# Patient Record
Sex: Male | Born: 1985
Health system: Southern US, Community
[De-identification: ages and names within clinical notes are randomized; demographics above are authoritative.]

## PROBLEM LIST (undated history)

## (undated) DIAGNOSIS — F309 Manic episode, unspecified: Secondary | ICD-10-CM

## (undated) DIAGNOSIS — F319 Bipolar disorder, unspecified: Secondary | ICD-10-CM

## (undated) DIAGNOSIS — R45851 Suicidal ideations: Secondary | ICD-10-CM

## (undated) DIAGNOSIS — F419 Anxiety disorder, unspecified: Secondary | ICD-10-CM

## (undated) DIAGNOSIS — F329 Major depressive disorder, single episode, unspecified: Secondary | ICD-10-CM

## (undated) DIAGNOSIS — F32A Depression, unspecified: Secondary | ICD-10-CM

## (undated) HISTORY — DX: Manic episode, unspecified: F30.9

## (undated) HISTORY — PX: WISDOM TOOTH EXTRACTION: SHX21

## (undated) HISTORY — DX: Anxiety disorder, unspecified: F41.9

## (undated) HISTORY — DX: Depression, unspecified: F32.A

## (undated) HISTORY — DX: Major depressive disorder, single episode, unspecified: F32.9

---

## 2019-01-09 ENCOUNTER — Encounter (INDEPENDENT_AMBULATORY_CARE_PROVIDER_SITE_OTHER): Payer: Self-pay

## 2019-01-09 ENCOUNTER — Other Ambulatory Visit: Payer: Self-pay

## 2019-01-09 ENCOUNTER — Encounter (HOSPITAL_COMMUNITY): Payer: Self-pay | Admitting: Psychiatry

## 2019-01-09 ENCOUNTER — Ambulatory Visit (INDEPENDENT_AMBULATORY_CARE_PROVIDER_SITE_OTHER): Payer: BLUE CROSS/BLUE SHIELD | Admitting: Psychiatry

## 2019-01-09 VITALS — BP 122/82 | HR 73 | Ht 71.0 in | Wt 159.0 lb

## 2019-01-09 DIAGNOSIS — F900 Attention-deficit hyperactivity disorder, predominantly inattentive type: Secondary | ICD-10-CM

## 2019-01-09 DIAGNOSIS — F411 Generalized anxiety disorder: Secondary | ICD-10-CM | POA: Insufficient documentation

## 2019-01-09 DIAGNOSIS — G4709 Other insomnia: Secondary | ICD-10-CM | POA: Diagnosis not present

## 2019-01-09 DIAGNOSIS — F3181 Bipolar II disorder: Secondary | ICD-10-CM | POA: Insufficient documentation

## 2019-01-09 DIAGNOSIS — R45851 Suicidal ideations: Secondary | ICD-10-CM

## 2019-01-09 MED ORDER — DULOXETINE HCL 60 MG PO CPEP: 60.0000 mg | ORAL_CAPSULE | Freq: Every day | ORAL | 0 refills | Status: DC

## 2019-01-09 MED ORDER — LURASIDONE HCL 40 MG PO TABS: 40.0000 mg | ORAL_TABLET | Freq: Every day | ORAL | 0 refills | Status: DC

## 2019-01-09 MED ORDER — LAMOTRIGINE 200 MG PO TABS: 200.0000 mg | ORAL_TABLET | Freq: Every day | ORAL | 2 refills | Status: DC

## 2019-01-09 NOTE — Progress Notes (Signed)
Psychiatric Initial Adult Assessment   Patient Identification: Adam [place]en. MRN:  [id number] Date of Evaluation:  01/09/2019 Referral Source: self Chief Complaint:  Depression, initial insomnia, anxiety Visit Diagnosis:    ICD-10-CM   1. Bipolar 2 disorder, major depressive episode (HCC) F31.81   2. GAD (generalized anxiety disorder) F41.1   3. ADHD, predominantly inattentive type F90.0     History of Present Illness:  33 yo single male who just came to [facility] from [place], [place] a week ago. He is now staying with his parents. He has a long hx of anxiety, depression, problems with focusing, concentration, task completion. He has been treated for depression/anxiety and ADD in the past. He has a hx of having SI and admits to one suicidal attempt by cutting in August 2019. He was at that time hospitalized at [facility] and started on his current medications (this was his only inpatient psychiatric admission).  He admits to a hx of brief 2-3 day long episodes on increased energy, irritability, racing thoughts, hyperactivity, decreased need for sleep. He also remembers having at times paranoid fears while in a hypomanic episode. 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 His depressive episodes are typically much longer and caused much more occupational and social dysfunction. He tried Wellbutrin, Effexor, now is on Cymbalta. Abilify and Lamictal were also started during his hospitalization as he was diagnosed with having bipolar disorder. He is also prescribed alprazolam on as needed basis for anxiety/insomnia. He does have difficulty with falling asleep and feels fatigued during the day often. In the past he was prescribed Ritalin for ADD (dx in 2nd grade). Patient has never tried any other mood stabilizers or antipsychotics. He admits that he still has occasional passive SI. He has a hx of alcohol and cannabis abuse but quit the former 2 years  ago and the latter 6 years ago. He has had a"a lot of" counseling over past years but does not want to resume it yet here.  Associated Signs/Symptoms: Depression Symptoms:  depressed mood, insomnia, difficulty concentrating, suicidal thoughts without plan, anxiety, (Hypo) Manic Symptoms:  None at this time Anxiety Symptoms:  Excessive Worry, Psychotic Symptoms:  None PTSD Symptoms: Negative  Past Psychiatric History: see above  Previous Psychotropic Medications: Yes   Substance Abuse History in the last 12 months:  No.  Consequences of Substance Abuse: Negative  Past Medical History:  Past Medical History:  Diagnosis Date  . Anxiety   . Depression   . Mania [facility])     Past Surgical History:  Procedure Laterality Date  . WISDOM TOOTH EXTRACTION      Family Psychiatric History: Reviewed  Family History:  Family History  Problem Relation Age of Onset  . Schizophrenia Paternal Grandmother     Social History:   Social History   Socioeconomic History  . Marital status: Single    Spouse name: Not on file  . Number of children: 0  . Years of education: SOME COLLEGE  . Highest education level: Associate degree: occupational, Scientist, product/process development, or vocational program  Occupational History  . Not on file  Social Needs  . Financial resource strain: Not hard at all  . Food insecurity:    Worry: Never true    Inability: Never true  . Transportation needs:    Medical: No    Non-medical: No  Tobacco Use  . Smoking status: Never Smoker  . Smokeless tobacco: Never Used  Substance  and Sexual Activity  . Alcohol use: Never    Frequency: Never  . Drug use: Never  . Sexual activity: Not Currently  Lifestyle  . Physical activity:    Days per week: 0 days    Minutes per session: 0 min  . Stress: Rather much  Relationships  . Social connections:    Talks on phone: Not on file    Gets together: Not on file    Attends religious service: Not on file    Active member of club  or organization: Not on file    Attends meetings of clubs or organizations: Not on file    Relationship status: Not on file  Other Topics Concern  . Not on file  Social History Narrative  . Not on file    Additional Social History: Single, no children, some college, currently unemployed.  Allergies:  Allergies no known allergies  Metabolic Disorder Labs: No results found for: HGBA1C, MPG No results found for: PROLACTIN No results found for: CHOL, TRIG, HDL, CHOLHDL, VLDL, LDLCALC No results found for: TSH  Therapeutic Level Labs: No results found for: LITHIUM No results found for: CBMZ No results found for: VALPROATE  Current Medications: Current Outpatient Medications  Medication Sig Dispense Refill  . DULoxetine (CYMBALTA) 60 MG capsule Take 1 capsule (60 mg total) by mouth daily for 30 days. 30 capsule 0  . lamoTRIgine (LAMICTAL) 200 MG tablet Take 1 tablet (200 mg total) by mouth at bedtime. 30 tablet 2  . lurasidone (LATUDA) 40 MG TABS tablet Take 1 tablet (40 mg total) by mouth daily after supper for 30 days. 30 tablet 0   No current facility-administered medications for this visit.     Musculoskeletal: Strength & Muscle Tone: within normal limits Gait & Station: normal Patient leans: N/A  Psychiatric Specialty Exam: Review of Systems  Constitutional: Negative.   HENT: Negative.   Eyes: Negative.   Respiratory: Negative.   Cardiovascular: Negative.   Gastrointestinal: Negative.   Genitourinary: Negative.   Musculoskeletal: Negative.   Skin: Negative.   Neurological: Negative.   Endo/Heme/Allergies: Negative.   Psychiatric/Behavioral: Positive for depression. The patient is nervous/anxious and has insomnia.     Blood pressure 122/82, pulse 73, height 5\' 11"  (1.803 m), weight 159 lb (72.1 kg).Body mass index is 22.18 kg/m.  General Appearance: Casual  Eye Contact:  Fair  Speech:  Clear and Coherent  Volume:  Normal  Mood:  Anxious and Depressed   Affect:  Constricted  Thought Process:  Goal Directed  Orientation:  Full (Time, Place, and Person)  Thought Content:  Possibly mildly paranoid  Suicidal Thoughts:  Yes.  without intent/plan  Homicidal Thoughts:  No  Memory:  Immediate;   Good Recent;   Good Remote;   Good  Judgement:  Fair  Insight:  Shallow  Psychomotor Activity:  Normal  Concentration:  Concentration: Fair  Recall:  Good  Fund of Knowledge:Fair  Language: Good  Akathisia:  Negative  Handed:  Right  AIMS (if indicated):  not done  Assets:  Housing Social Support  ADL's:  Intact  Cognition: WNL  Sleep:  Fair    Assessment and Plan: 33 yo single male who just came to [place] from [place], [place] a week ago. He is now staying with his parents. He has a long hx of anxiety, depression, problems with focusing, concentration, task completion. He has been treated for depression/anxiety and ADD in the past. He has a hx of having SI and admits to  one suicidal attempt by cutting in August 2019. He admits to a hx of brief 2-3 day long episodes on increased energy, irritability, racing thoughts, hyperactivity, decreased need for sleep. He also remembers having at times paranoid fears while in a hypomanic episode. 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 His depressive episodes are typically much longer and caused much more occupational and social dysfunction.   Dx; Bipolar 2 disorder depressed, GAD, ADD  Plan: Change Abilify to Latuda  For bipolar depression, continue Cymbalta for anxiety and increase dose of Lamictal to 200 mg. The plan was discussed with patient. I spend 60 minutes in direct face to face clinical contact with the patient and devoted approximately 50% of this time to explanation of diagnosis, discussion of treatment options and med education. Return to clinic in one month.    Magdalene Patricia, MD 3/21/202010:17 AM

## 2019-01-09 NOTE — Patient Instructions (Signed)
Plan:  1. We will change Abilify to Latuda to better address bipolar depression. Take 1/2 tablet of Abilify for 4 days then stop. Start taking one 40 mg pill of Latuda with supper today (or tomorrow).  2. We are increasing dose of Lamictal to 200 mg - continue to take it at bedtime.  3. Continue duloxetine (Cymbalta) 60 mg at night - for anxiety.

## 2019-01-12 ENCOUNTER — Other Ambulatory Visit (HOSPITAL_COMMUNITY): Payer: Self-pay

## 2019-01-12 MED ORDER — LURASIDONE HCL 40 MG PO TABS: 40.0000 mg | ORAL_TABLET | Freq: Every day | ORAL | 2 refills | Status: DC

## 2019-01-12 MED ORDER — DULOXETINE HCL 60 MG PO CPEP: 60.0000 mg | ORAL_CAPSULE | Freq: Every day | ORAL | 2 refills | Status: DC

## 2019-01-12 MED ORDER — LAMOTRIGINE 200 MG PO TABS: 200.0000 mg | ORAL_TABLET | Freq: Every day | ORAL | 2 refills | Status: DC

## 2019-01-13 ENCOUNTER — Telehealth (HOSPITAL_COMMUNITY): Payer: Self-pay

## 2019-01-13 NOTE — Telephone Encounter (Signed)
Medication management - Prior authorization for pt's prescribed Latuda 40 mg tablets completed online with CoverMyMeds and awaiting decision from pt's [employer].

## 2019-02-02 ENCOUNTER — Other Ambulatory Visit (HOSPITAL_COMMUNITY): Payer: Self-pay | Admitting: Psychiatry

## 2019-02-02 ENCOUNTER — Other Ambulatory Visit (HOSPITAL_COMMUNITY): Payer: Self-pay

## 2019-02-02 ENCOUNTER — Telehealth (HOSPITAL_COMMUNITY): Payer: Self-pay

## 2019-02-02 MED ORDER — QUETIAPINE FUMARATE 50 MG PO TABS: 50.0000 mg | ORAL_TABLET | Freq: Every day | ORAL | 0 refills | Status: DC

## 2019-02-02 NOTE — Telephone Encounter (Signed)
I ordered Seroquel 50 mg at HS instead of Latuda (he already failed Abilify and I am sure insurance will not approve Vraylar either).  OP

## 2019-02-02 NOTE — Telephone Encounter (Signed)
I called patient and left a voicemail letting him know

## 2019-02-02 NOTE — Telephone Encounter (Signed)
Patient is calling, his insurance denied the Latuda, please provide alternative.

## 2019-02-08 ENCOUNTER — Ambulatory Visit (INDEPENDENT_AMBULATORY_CARE_PROVIDER_SITE_OTHER): Payer: BLUE CROSS/BLUE SHIELD | Admitting: Psychiatry

## 2019-02-08 ENCOUNTER — Other Ambulatory Visit: Payer: Self-pay

## 2019-02-08 DIAGNOSIS — F411 Generalized anxiety disorder: Secondary | ICD-10-CM

## 2019-02-08 DIAGNOSIS — F900 Attention-deficit hyperactivity disorder, predominantly inattentive type: Secondary | ICD-10-CM

## 2019-02-08 DIAGNOSIS — F3181 Bipolar II disorder: Secondary | ICD-10-CM | POA: Diagnosis not present

## 2019-02-08 MED ORDER — BREXPIPRAZOLE 1 MG PO TABS: 1.0000 mg | ORAL_TABLET | Freq: Every day | ORAL | 0 refills | Status: DC

## 2019-02-08 NOTE — Progress Notes (Signed)
BH MD/PA/NP OP Progress Note  02/08/2019 2:13 PM Adam [place].  MRN:  [id number] Interview was conducted using telephone and I verified that I was speaking with the correct person using two identifiers. I discussed the limitations of evaluation and management by telemedicine and  the availability of in person appointments. Patient expressed understanding and agreed to proceed.  Chief Complaint: Depression, fatigue/sleepiness.  HPI: 33 yo single male who just came to [facility] from [place], [place] a month. He has a long hx of anxiety, depression, problems with focusing, concentration, task completion. He has been treated for depression/anxiety and ADD in the past (Ritalin). He has a hx of having SI and admits to one suicidal attempt by cutting in August 2019. He admits to a hx of brief 2-3 day long episodes on increased energy, irritability, racing thoughts, hyperactivity, decreased need for sleep. He also remembers having at times paranoid fears while in a hypomanic episode. 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 His depressive episodes are typically much longer and caused much more occupational and social dysfunction. We planned to change Abilify to [facility]  for bipolar depression, continue Cymbalta for anxiety and increase dose of Lamictal to 200 mg. Patient's insurance Psychologist, occupational) did not approve Latuda so we started low 50 mg dose of Seroquel instead. Patient however cannot tolerate it - feels sedated, tired the next day. No current SI but still has some depression, anxiety and problems with focusing. Denies having mood swings.  Visit Diagnosis:    ICD-10-CM   1. Bipolar 2 disorder, major depressive episode (HCC) F31.81   2. GAD (generalized anxiety disorder) F41.1   3. ADHD, predominantly inattentive type F90.0     Past Psychiatric History: Please refer to intake H&P.  Past Medical History:  Past Medical History:  Diagnosis Date  . Anxiety   .  Depression   . Mania [facility])     Past Surgical History:  Procedure Laterality Date  . WISDOM TOOTH EXTRACTION      Family Psychiatric History: Reviewed.  Family History:  Family History  Problem Relation Age of Onset  . Schizophrenia Paternal Grandmother     Social History:  Social History   Socioeconomic History  . Marital status: Single    Spouse name: Not on file  . Number of children: 0  . Years of education: SOME COLLEGE  . Highest education level: Associate degree: occupational, Scientist, product/process development, or vocational program  Occupational History  . Not on file  Social Needs  . Financial resource strain: Not hard at all  . Food insecurity:    Worry: Never true    Inability: Never true  . Transportation needs:    Medical: No    Non-medical: No  Tobacco Use  . Smoking status: Never Smoker  . Smokeless tobacco: Never Used  Substance and Sexual Activity  . Alcohol use: Never    Frequency: Never  . Drug use: Never  . Sexual activity: Not Currently  Lifestyle  . Physical activity:    Days per week: 0 days    Minutes per session: 0 min  . Stress: Rather much  Relationships  . Social connections:    Talks on phone: Not on file    Gets together: Not on file    Attends religious service: Not on file    Active member of club or organization: Not on file    Attends meetings of clubs or organizations: Not on file  Relationship status: Not on file  Other Topics Concern  . Not on file  Social History Narrative  . Not on file    Allergies: Allergies no known allergies  Metabolic Disorder Labs: No results found for: HGBA1C, MPG No results found for: PROLACTIN No results found for: CHOL, TRIG, HDL, CHOLHDL, VLDL, LDLCALC No results found for: TSH  Therapeutic Level Labs: No results found for: LITHIUM No results found for: VALPROATE No components found for:  CBMZ  Current Medications: Current Outpatient Medications  Medication Sig Dispense Refill  . DULoxetine  (CYMBALTA) 60 MG capsule Take 1 capsule (60 mg total) by mouth daily for 30 days. 30 capsule 2  . lamoTRIgine (LAMICTAL) 200 MG tablet Take 1 tablet (200 mg total) by mouth at bedtime. 30 tablet 2   No current facility-administered medications for this visit.      Psychiatric Specialty Exam: Review of Systems  Psychiatric/Behavioral: Positive for depression.  All other systems reviewed and are negative.   There were no vitals taken for this visit.There is no height or weight on file to calculate BMI.  General Appearance: NA  Eye Contact:  NA  Speech:  Clear and Coherent  Volume:  Normal  Mood:  Depressed  Affect:  NA  Thought Process:  Goal Directed  Orientation:  Full (Time, Place, and Person)  Thought Content: Logical   Suicidal Thoughts:  No  Homicidal Thoughts:  No  Memory:  Good  Judgement:  Intact  Insight:  Good  Psychomotor Activity:  NA  Concentration:  Concentration: Fair  Recall:  Good  Fund of Knowledge: Good  Language: Good  Akathisia:  NA  Handed:  Right  AIMS (if indicated): not done  Assets:  Communication Skills Desire for Improvement Housing Physical Health Social Support  ADL's:  Intact  Cognition: WNL  Sleep:  Good     Assessment and Plan: 33 yo single male who [place]. He has a long hx of anxiety, depression, problems with focusing, concentration, task completion. He has been treated for depression/anxiety and ADD in the past (Ritalin). He has a hx of having SI and admits to one suicidal attempt by cutting in August 2019. He admits to a hx of brief 2-3 day long episodes on increased energy, irritability, racing thoughts, hyperactivity, decreased need for sleep. He also remembers having at times paranoid fears while in a hypomanic episode. 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 His depressive episodes are typically much longer and caused much more occupational and  social dysfunction. We [facility]  for bipolar depression, continue Cymbalta for anxiety and increase dose of Lamictal to 200 mg. Patient's insurance Psychologist, occupational(BC/BS) did not approve Latuda so we started low 50 mg dose of Seroquel instead. Patient however cannot tolerate it - feels sedated, tired the next day. No current SI but still has some depression, anxiety and problems with focusing. Denies having mood swings.  Dx: Bipolar 2 disorder depressed, GAD, ADD  Plan: Dc Seroquel, continue Cymbalta 60 mg an Lamictal 200 mg at HS. Add Rexulti 1 mg daily for Cymbalta augmentation. Return to clinic in 2-3 weeks. At that time we will consider restarting Ritalin but also may increase Rexulti to 2 mg.  Magdalene Patricialgierd A [place] Giroux, MD 02/08/2019, 2:13 PM

## 2019-02-23 ENCOUNTER — Ambulatory Visit (HOSPITAL_COMMUNITY): Payer: BLUE CROSS/BLUE SHIELD | Admitting: Psychiatry

## 2019-03-01 ENCOUNTER — Ambulatory Visit (INDEPENDENT_AMBULATORY_CARE_PROVIDER_SITE_OTHER): Payer: BLUE CROSS/BLUE SHIELD | Admitting: Psychiatry

## 2019-03-01 ENCOUNTER — Other Ambulatory Visit: Payer: Self-pay

## 2019-03-01 DIAGNOSIS — F411 Generalized anxiety disorder: Secondary | ICD-10-CM

## 2019-03-01 DIAGNOSIS — F3181 Bipolar II disorder: Secondary | ICD-10-CM | POA: Diagnosis not present

## 2019-03-01 MED ORDER — BREXPIPRAZOLE 1 MG PO TABS: 2.0000 mg | ORAL_TABLET | Freq: Every day | ORAL | 0 refills | Status: DC

## 2019-03-01 MED ORDER — DULOXETINE HCL 60 MG PO CPEP: 60.0000 mg | ORAL_CAPSULE | Freq: Every day | ORAL | 0 refills | Status: DC

## 2019-03-01 MED ORDER — LAMOTRIGINE 200 MG PO TABS: 200.0000 mg | ORAL_TABLET | Freq: Every day | ORAL | 0 refills | Status: DC

## 2019-03-01 NOTE — Progress Notes (Signed)
BH MD/PA/NP OP Progress Note  03/01/2019 8:51 AM Adam Larsen.  MRN:  [id number] Interview was conducted by phone and I verified that I was speaking with the correct person using two identifiers. I discussed the limitations of evaluation and management by telemedicine and  the availability of in person appointments. Patient expressed understanding and agreed to proceed.  Chief Complaint: Some depression.  HPI: 33 yo single male who just came to [place] from [place], [place] a month. He has a long hx of anxiety, depression, problems with focusing, concentration, task completion. He has been treated for depression/anxiety and ADD in the past (Ritalin). He has a hx of having SI and admits to one suicidal attempt by cutting in August 2019. He admits to a hx of brief 2-3 day long episodes on increased energy, irritability, racing thoughts, hyperactivity, decreased need for sleep. He also remembers having at times paranoid fears while in a hypomanic episode. 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 His depressive episodes are typically much longer and caused much more occupational and social dysfunction. We planned to change Abilify to [facility] for bipolar depression, continue Cymbalta for anxiety and increase dose of Lamictal to 200 mg. Patient's insurance Psychologist, occupational) did not approve Latuda so we started low 50 mg dose of Seroquel instead. Patient however could not tolerate it - sedated, tired the next day. No current SI but still has some depression, anxiety and problems with focusing. Denies having mood swings. We then added Rexulti 1 mg foir augmentation and he feels some benefit already in terms of mood improvement.  Visit Diagnosis:    ICD-10-CM   1. Bipolar 2 disorder, major depressive episode (HCC) F31.81   2. GAD (generalized anxiety disorder) F41.1     Past Psychiatric History: Please refer to intake H&P.  Past Medical History:  Past Medical History:   Diagnosis Date  . Anxiety   . Depression   . Mania [facility])     Past Surgical History:  Procedure Laterality Date  . WISDOM TOOTH EXTRACTION      Family Psychiatric History: Reviewed.  Family History:  Family History  Problem Relation Age of Onset  . Schizophrenia Paternal Grandmother     Social History:  Social History   Socioeconomic History  . Marital status: Single    Spouse name: Not on file  . Number of children: 0  . Years of education: SOME COLLEGE  . Highest education level: Associate degree: occupational, Scientist, product/process development, or vocational program  Occupational History  . Not on file  Social Needs  . Financial resource strain: Not hard at all  . Food insecurity:    Worry: Never true    Inability: Never true  . Transportation needs:    Medical: No    Non-medical: No  Tobacco Use  . Smoking status: Never Smoker  . Smokeless tobacco: Never Used  Substance and Sexual Activity  . Alcohol use: Never    Frequency: Never  . Drug use: Never  . Sexual activity: Not Currently  Lifestyle  . Physical activity:    Days per week: 0 days    Minutes per session: 0 min  . Stress: Rather much  Relationships  . Social connections:    Talks on phone: Not on file    Gets together: Not on file    Attends religious service: Not on file    Active member of club or organization: Not on file    Attends  meetings of clubs or organizations: Not on file    Relationship status: Not on file  Other Topics Concern  . Not on file  Social History Narrative  . Not on file    Allergies: Allergies no known allergies  Metabolic Disorder Labs: No results found for: HGBA1C, MPG No results found for: PROLACTIN No results found for: CHOL, TRIG, HDL, CHOLHDL, VLDL, LDLCALC No results found for: TSH  Therapeutic Level Labs: No results found for: LITHIUM No results found for: VALPROATE No components found for:  CBMZ  Current Medications: Current Outpatient Medications  Medication Sig  Dispense Refill  . Brexpiprazole (REXULTI) 1 MG TABS Take 2 tablets (2 mg total) by mouth daily. 180 tablet 0  . DULoxetine (CYMBALTA) 60 MG capsule Take 1 capsule (60 mg total) by mouth daily. 90 capsule 0  . lamoTRIgine (LAMICTAL) 200 MG tablet Take 1 tablet (200 mg total) by mouth at bedtime. 90 tablet 0   No current facility-administered medications for this visit.     Psychiatric Specialty Exam: Review of Systems  Psychiatric/Behavioral: Positive for depression.  All other systems reviewed and are negative.   There were no vitals taken for this visit.There is no height or weight on file to calculate BMI.  General Appearance: NA  Eye Contact:  NA  Speech:  Clear and Coherent  Volume:  Normal  Mood:  Depressed  Affect:  NA  Thought Process:  Goal Directed  Orientation:  Full (Time, Place, and Person)  Thought Content: Logical   Suicidal Thoughts:  No  Homicidal Thoughts:  No  Memory:  Immediate;   Good Recent;   Good Remote;   Good  Judgement:  Good  Insight:  Good  Psychomotor Activity:  NA  Concentration:  Concentration: Fair  Recall:  Good  Fund of Knowledge: Good  Language: Good  Akathisia:  NA  Handed:  Right  AIMS (if indicated): not done  Assets:  Communication Skills Desire for Improvement Financial Resources/Insurance Housing Physical Health  ADL's:  Intact  Cognition: WNL  Sleep:  Good    Assessment and Plan: 33 yo single male who just came to [name] from [place]. He has a long hx of anxiety, depression, problems with focusing, concentration, task completion. He has been treated for depression/anxiety and ADD in the past (Ritalin). He has a hx of having SI and admits to one suicidal attempt by cutting in August 2019. He admits to a hx of brief 2-3 day long episodes on increased energy, irritability, racing thoughts, hyperactivity, decreased need for sleep. He also remembers having at times paranoid fears while in a hypomanic episode. 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 His depressive episodes are typically much longer and caused much more occupational and social dysfunction. We planned to [facility] for [facility] depression, continue Cymbalta for anxiety and increase dose of Lamictal to 200 mg. Patient's insurance Psychologist, occupational(BC/BS) did not approve Latuda so we started low 50 mg dose of Seroquel instead. Patient however could not tolerate it - sedated, tired the next day. No current SI but still has some depression, anxiety and problems with focusing. Denies having mood swings. We then added Rexulti 1 mg foir augmentation and he feels some benefit already in terms of mood improvement.  Dx: Bipolar 2 disorder depressed, GAD, ADD  Plan: Continue Cymbalta 60 mg an Lamictal 200 mg at HS. Increase Rexulti to 2 mg daily for Cymbalta  augmentation. Next visit in one month.   Magdalene Patricia, MD 03/01/2019, 8:51 AM

## 2019-03-04 ENCOUNTER — Other Ambulatory Visit (HOSPITAL_COMMUNITY): Payer: Self-pay

## 2019-03-04 MED ORDER — BREXPIPRAZOLE 2 MG PO TABS: 1.0000 mg | ORAL_TABLET | Freq: Two times a day (BID) | ORAL | 2 refills | Status: DC

## 2019-04-05 ENCOUNTER — Ambulatory Visit (INDEPENDENT_AMBULATORY_CARE_PROVIDER_SITE_OTHER): Payer: BC Managed Care – PPO | Admitting: Psychiatry

## 2019-04-05 ENCOUNTER — Other Ambulatory Visit: Payer: Self-pay

## 2019-04-05 DIAGNOSIS — F3181 Bipolar II disorder: Secondary | ICD-10-CM | POA: Diagnosis not present

## 2019-04-05 DIAGNOSIS — F900 Attention-deficit hyperactivity disorder, predominantly inattentive type: Secondary | ICD-10-CM | POA: Diagnosis not present

## 2019-04-05 DIAGNOSIS — F411 Generalized anxiety disorder: Secondary | ICD-10-CM

## 2019-04-05 MED ORDER — METHYLPHENIDATE HCL ER 20 MG PO TBCR: 20.0000 mg | EXTENDED_RELEASE_TABLET | ORAL | 0 refills | Status: DC

## 2019-04-05 NOTE — Progress Notes (Signed)
BH MD/PA/NP OP Progress Note  04/05/2019 1:42 PM Adam Roque Cashodson Jr.  MRN:  [id number] Interview was conducted by phone and I verified that I was speaking with the correct person using two identifiers. I discussed the limitations of evaluation and management by telemedicine and  the availability of in person appointments. Patient expressed understanding and agreed to proceed.  Chief Complaint: Lack of motivation, fatigue  HPI: 33 yo single male [facility] [place] He has a long hx of anxiety, depression, problems with focusing, concentration, task completion. He has been treated for depression/anxiety and ADD in the past(Ritalin). He has a hx of having SI and admits to one suicidal attempt by cutting in August 2019. He admits to a hx of brief 2-3 day long episodes on increased energy, irritability, racing thoughts, hyperactivity, decreased need for sleep. He also remembers having at times paranoid fears while in a hypomanic episode. 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 His depressive episodes are typically much longer and caused much more occupational and social dysfunction.We planned to changeAbilify to Latudafor bipolar depression, continue Cymbalta for anxiety and increase dose of Lamictal to 200 mg.Patient's insurance Psychologist, occupational(BC/BS) did not approve Latuda so we started low 50 mg dose of Seroquel instead. Patient however could not tolerate it - sedated, tired the next day. No current SI. He reports problems with focusing, fatigue, lack of motication. Denies having mood swings. We then added Rexulti (now on 2 mg) for augmentation and he feels that his mood improved. No restlessness, tremor reported. Adam Larsen takes all his meds at night and sleep is generally adequate.    Visit Diagnosis:    ICD-10-CM   1. Bipolar 2 disorder, major depressive episode (HCC)  F31.81   2. GAD (generalized anxiety disorder)  F41.1   3. ADHD,  predominantly inattentive type  F90.0     Past Psychiatric History: Please see intake H&P.  Past Medical History:  Past Medical History:  Diagnosis Date  . Anxiety   . Depression   . [facility](HCC)     Past Surgical History:  Procedure Laterality Date  . WISDOM TOOTH EXTRACTION      Family Psychiatric History: Reviewed.  Family History:  Family History  Problem Relation Age of Onset  . Schizophrenia Paternal Grandmother     Social History:  Social History   Socioeconomic History  . Marital status: Single    Spouse name: Not on file  . Number of children: 0  . Years of education: SOME COLLEGE  . Highest education level: Associate degree: occupational, Scientist, product/process developmenttechnical, or vocational program  Occupational History  . Not on file  Social Needs  . Financial resource strain: Not hard at all  . Food insecurity    Worry: Never true    Inability: Never true  . Transportation needs    Medical: No    Non-medical: No  Tobacco Use  . Smoking status: Never Smoker  . Smokeless tobacco: Never Used  Substance and Sexual Activity  . Alcohol use: Never    Frequency: Never  . Drug use: Never  . Sexual activity: Not Currently  Lifestyle  . Physical activity    Days per week: 0 days    Minutes per session: 0 min  . Stress: Rather much  Relationships  . Social Musicianconnections    Talks on phone: Not on file    Gets together: Not on file    Attends  religious service: Not on file    Active member of club or organization: Not on file    Attends meetings of clubs or organizations: Not on file    Relationship status: Not on file  Other Topics Concern  . Not on file  Social History Narrative  . Not on file    Allergies: No Known Allergies  Metabolic Disorder Labs: No results found for: HGBA1C, MPG No results found for: PROLACTIN No results found for: CHOL, TRIG, HDL, CHOLHDL, VLDL, LDLCALC No results found for: TSH  Therapeutic Level Labs: No results found for: LITHIUM No results  found for: VALPROATE No components found for:  CBMZ  Current Medications: Current Outpatient Medications  Medication Sig Dispense Refill  . Brexpiprazole (REXULTI) 2 MG TABS Take 1 mg by mouth 2 (two) times daily. Take 1/2 tablet (1mg ) two times daily 15 tablet 2  . DULoxetine (CYMBALTA) 60 MG capsule Take 1 capsule (60 mg total) by mouth daily. 90 capsule 0  . lamoTRIgine (LAMICTAL) 200 MG tablet Take 1 tablet (200 mg total) by mouth at bedtime. 90 tablet 0   No current facility-administered medications for this visit.      Psychiatric Specialty Exam: Review of Systems  Constitutional: Positive for malaise/fatigue.  All other systems reviewed and are negative.   There were no vitals taken for this visit.There is no height or weight on file to calculate BMI.  General Appearance: NA  Eye Contact:  NA  Speech:  Clear and Coherent  Volume:  Normal  Mood:  Euthymic  Affect:  NA  Thought Process:  Goal Directed  Orientation:  Full (Time, Place, and Person)  Thought Content: Logical   Suicidal Thoughts:  No  Homicidal Thoughts:  No  Memory:  Immediate;   Good Recent;   Good Remote;   Good  Judgement:  Good  Insight:  Good  Psychomotor Activity:  NA  Concentration:  Concentration: Fair  Recall:  Good  Fund of Knowledge: Good  Language: Good  Akathisia:  Negative  Handed:  Right  AIMS (if indicated): not done  Assets:  Communication Skills Desire for Improvement Financial Resources/Insurance Housing Physical Health  ADL's:  Intact  Cognition: WNL  Sleep:  Fair    Assessment and Plan: 33 yo single male who just came to [place] He has a long hx of anxiety, depression, problems with focusing, concentration, task completion. He has been treated for depression/anxiety and ADD in the past(Ritalin). He has a hx of having SI and admits to one suicidal attempt by cutting in August 2019. He admits to a hx of brief 2-3 day long episodes on increased  energy, irritability, racing thoughts, hyperactivity, decreased need for sleep. He also remembers having at times paranoid fears while in a hypomanic episode. 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 His depressive episodes are typically much longer and caused much more occupational and social dysfunction.We planned to changeAbilify to Latudafor bipolar depression, continue Cymbalta for anxiety and increase dose of Lamictal to 200 mg.Patient's insurance Psychologist, occupational(BC/BS) did not approve Latuda so we started low 50 mg dose of Seroquel instead. Patient however could not tolerate it - sedated, tired the next day. No current SI. He reports problems with focusing, fatigue, lack of motication. Denies having mood swings. We then added Rexulti (now on 2 mg) for augmentation and he feels that his mood improved. No restlessness, tremor reported. Alice takes all his meds  at night and sleep is generally adequate.   TD:HRCBULA 2 disorder depressed, GAD, ADD  Plan: Continue Cymbalta 60 mg, Rexulti 2 mg and Lamictal 200 mg at HS. Add methylphenidate ER 20 mg daily for ADD sx.  Next visit in one month.    Stephanie Acre, MD 04/05/2019, 1:42 PM

## 2019-04-22 ENCOUNTER — Telehealth (HOSPITAL_COMMUNITY): Payer: Self-pay

## 2019-04-22 NOTE — Telephone Encounter (Signed)
Patients mother called, she stated that patient is doing well on the Metadate and he would like to know if the dose can be increased. Please review and advise, thank you

## 2019-04-26 ENCOUNTER — Other Ambulatory Visit (HOSPITAL_COMMUNITY): Payer: Self-pay | Admitting: Psychiatry

## 2019-04-26 MED ORDER — METHYLPHENIDATE HCL ER (OSM) 36 MG PO TBCR: 36.0000 mg | EXTENDED_RELEASE_TABLET | ORAL | 0 refills | Status: DC

## 2019-04-26 NOTE — Telephone Encounter (Signed)
Called and spoke to patients father, he is aware the prescription is at the pharmacy

## 2019-04-26 NOTE — Telephone Encounter (Signed)
It is the maximum dose of Methadate so I prescribed Concerta 36 mg instead which is the next higher dose roughly equivalent to 30 mg of Mathadate.

## 2019-04-27 ENCOUNTER — Other Ambulatory Visit (HOSPITAL_COMMUNITY): Payer: Self-pay

## 2019-04-27 MED ORDER — REXULTI 2 MG PO TABS: 1.0000 mg | ORAL_TABLET | Freq: Two times a day (BID) | ORAL | 2 refills | Status: DC

## 2019-05-04 ENCOUNTER — Ambulatory Visit (HOSPITAL_COMMUNITY): Payer: BC Managed Care – PPO | Admitting: Psychiatry

## 2019-05-04 ENCOUNTER — Other Ambulatory Visit: Payer: Self-pay

## 2019-05-05 ENCOUNTER — Other Ambulatory Visit: Payer: Self-pay

## 2019-05-05 ENCOUNTER — Ambulatory Visit (INDEPENDENT_AMBULATORY_CARE_PROVIDER_SITE_OTHER): Payer: BC Managed Care – PPO | Admitting: Psychiatry

## 2019-05-05 DIAGNOSIS — F3181 Bipolar II disorder: Secondary | ICD-10-CM

## 2019-05-05 DIAGNOSIS — F900 Attention-deficit hyperactivity disorder, predominantly inattentive type: Secondary | ICD-10-CM | POA: Diagnosis not present

## 2019-05-05 DIAGNOSIS — F411 Generalized anxiety disorder: Secondary | ICD-10-CM

## 2019-05-05 MED ORDER — METHYLPHENIDATE HCL ER (OSM) 36 MG PO TBCR: 36.0000 mg | EXTENDED_RELEASE_TABLET | ORAL | 0 refills | Status: DC

## 2019-05-05 MED ORDER — DULOXETINE HCL 60 MG PO CPEP: 60.0000 mg | ORAL_CAPSULE | Freq: Every day | ORAL | 0 refills | Status: DC

## 2019-05-05 MED ORDER — REXULTI 2 MG PO TABS: 2.0000 mg | ORAL_TABLET | Freq: Every day | ORAL | 2 refills | Status: DC

## 2019-05-05 MED ORDER — LAMOTRIGINE 200 MG PO TABS: 200.0000 mg | ORAL_TABLET | Freq: Every day | ORAL | 0 refills | Status: DC

## 2019-05-05 NOTE — Progress Notes (Signed)
BH MD/PA/NP OP Progress Note  05/05/2019 3:39 PM Adam Larsen.  MRN:  [id number] Interview was conducted by phone and I verified that I was speaking with the correct person using two identifiers. I discussed the limitations of evaluation and management by telemedicine and  the availability of in person appointments. Patient expressed understanding and agreed to proceed.  Chief Complaint: "I have none".  HPI: 33 yo single male with a long hx of anxiety, depression, problems with focusing, concentration, task completion. He has been treated for depression/anxiety and ADD in the past(Ritalin). He has a hx of having SI and admits to one suicidal attempt by cutting in August 2019. He admits to a hx of brief 2-3 day long episodes on increased energy, irritability, racing thoughts, hyperactivity, decreased need for sleep. He also remembers having at times paranoid fears while in a hypomanic episode. 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 His depressive episodes are typically much longer and caused much more occupational and social dysfunction.We planned to changeAbilify to Latudafor bipolar depression, continue Cymbalta for anxiety and increase dose of Lamictal to 200 mg.Patient's insurance [employer]) did not approve Latuda so we started low 50 mg dose of Seroquel instead. Patient however could nottolerate it - sedated, tired the next day. We therefore started Rexulti for augmentation of duloxetine which he tolerates well. We also started methylphenidate xr initially 20 mg daily, recently changed to Concerta 36 mg. Orie not only reports much improvement in concentration/task completion but also his mood normalized, anxiety subsided, energy and motivation increased. No restlessness or tremor reported. Sanjit takes all his meds (except for Concerta) at night and sleep is generally adequate.    Visit Diagnosis:    ICD-10-CM   1. ADHD, predominantly  inattentive type  F90.0   2. Bipolar 2 disorder, major depressive episode ([place])  F31.81   3. GAD (generalized anxiety disorder)  F41.1     Past Psychiatric History: Please refer to intake H&P.  Past Medical History:  Past Medical History:  Diagnosis Date  . Anxiety   . Depression   . Mania [facility])     Past Surgical History:  Procedure Laterality Date  . WISDOM TOOTH EXTRACTION      Family Psychiatric History: Reviewed.  Family History:  Family History  Problem Relation Age of Onset  . Schizophrenia Paternal Grandmother     Social History:  Social History   Socioeconomic History  . Marital status: Single    Spouse name: Not on file  . Number of children: 0  . Years of education: SOME COLLEGE  . Highest education level: Associate degree: occupational, Hotel manager, or vocational program  Occupational History  . Not on file  Social Needs  . Financial resource strain: Not hard at all  . Food insecurity    Worry: Never true    Inability: Never true  . Transportation needs    Medical: No    Non-medical: No  Tobacco Use  . Smoking status: Never Smoker  . Smokeless tobacco: Never Used  Substance and Sexual Activity  . Alcohol use: Never    Frequency: Never  . Drug use: Never  . Sexual activity: Not Currently  Lifestyle  . Physical activity    Days per week: 0 days    Minutes per session: 0 min  . Stress: Rather much  Relationships  . Social Herbalist on phone: Not on file    Gets together: Not  on file    Attends religious service: Not on file    Active member of club or organization: Not on file    Attends meetings of clubs or organizations: Not on file    Relationship status: Not on file  Other Topics Concern  . Not on file  Social History Narrative  . Not on file    Allergies: No Known Allergies  Metabolic Disorder Labs: No results found for: HGBA1C, MPG No results found for: PROLACTIN No results found for: CHOL, TRIG, HDL, CHOLHDL, VLDL,  LDLCALC No results found for: TSH  Therapeutic Level Labs: No results found for: LITHIUM No results found for: VALPROATE No components found for:  CBMZ  Current Medications: Current Outpatient Medications  Medication Sig Dispense Refill  . Brexpiprazole (REXULTI) 2 MG TABS Take 1 mg by mouth 2 (two) times daily. Take 1/2 tablet (1mg ) two times daily 30 tablet 2  . DULoxetine (CYMBALTA) 60 MG capsule Take 1 capsule (60 mg total) by mouth daily. 90 capsule 0  . lamoTRIgine (LAMICTAL) 200 MG tablet Take 1 tablet (200 mg total) by mouth at bedtime. 90 tablet 0  . methylphenidate (CONCERTA) 36 MG CR tablet Take 1 tablet (36 mg total) by mouth every morning. 30 tablet 0   No current facility-administered medications for this visit.     Psychiatric Specialty Exam: Review of Systems  Constitutional: Negative.   Cardiovascular: Negative.   Gastrointestinal: Negative.   Neurological: Negative.   Psychiatric/Behavioral: Negative.   All other systems reviewed and are negative.   There were no vitals taken for this visit.There is no height or weight on file to calculate BMI.  General Appearance: NA  Eye Contact:  NA  Speech:  Clear and Coherent and Normal Rate  Volume:  Normal  Mood:  Euthymic  Affect:  NA  Thought Process:  Goal Directed  Orientation:  Full (Time, Place, and Person)  Thought Content: Logical   Suicidal Thoughts:  No  Homicidal Thoughts:  No  Memory:  Immediate;   Good Recent;   Good Remote;   Good  Judgement:  Good  Insight:  Good  Psychomotor Activity:  NA  Concentration:  Concentration: Good  Recall:  Good  Fund of Knowledge: Good  Language: Good  Akathisia:  Negative  Handed:  Right  AIMS (if indicated): not done  Assets:  Communication Skills Desire for Improvement Financial Resources/Insurance Housing Physical Health  ADL's:  Intact  Cognition: WNL  Sleep:  Good   Assessment and Plan: 33 yo single male with a long hx of anxiety, depression,  problems with focusing, concentration, task completion. He has been treated for depression/anxiety and ADD in the past(Ritalin). He has a hx of having SI and admits to one suicidal attempt by cutting in August 2019. He admits to a hx of brief 2-3 day long episodes on increased energy, irritability, racing thoughts, hyperactivity, decreased need for sleep. He also remembers having at times paranoid fears while in a hypomanic episode. 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 His depressive episodes are typically much longer and caused much more occupational and social dysfunction.We planned to changeAbilify to Latudafor bipolar depression, continue Cymbalta for anxiety and increase dose of Lamictal to 200 mg.Patient's insurance Psychologist, occupational(BC/BS) did not approve Latuda so we started low 50 mg dose of Seroquel instead. Patient however could nottolerate it - sedated, tired the next day. We therefore started Rexulti for augmentation of duloxetine which he tolerates  well. We also started methylphenidate xr initially 20 mg daily, recently changed to Concerta 36 mg. Kalep not only reports much improvement in concentration/task completion but also his mood normalized, anxiety subsided, energy and motivation increased. No restlessness or tremor reported. Jarett takes all his meds (except for Concerta) at night and sleep is generally adequate.   WU:[id number]:Bipolar 2 disorder depressed, GAD, ADD  Plan:Continue Cymbalta 60 mg, Rexulti 2 mg, Lamictal 200 mg at HS and Concerta 36 mg in AM.  Next visit in one month. The plan was discussed with patient who had an opportunity to ask questions and these were all answered. I spend 25 minutes in phone consultation with the patient.     Magdalene Patricialgierd A Rudell Ortman, MD 05/05/2019, 3:39 PM

## 2019-06-03 ENCOUNTER — Ambulatory Visit (INDEPENDENT_AMBULATORY_CARE_PROVIDER_SITE_OTHER): Payer: BC Managed Care – PPO | Admitting: Psychiatry

## 2019-06-03 ENCOUNTER — Other Ambulatory Visit: Payer: Self-pay

## 2019-06-03 DIAGNOSIS — F3181 Bipolar II disorder: Secondary | ICD-10-CM | POA: Diagnosis not present

## 2019-06-03 DIAGNOSIS — F411 Generalized anxiety disorder: Secondary | ICD-10-CM

## 2019-06-03 DIAGNOSIS — F900 Attention-deficit hyperactivity disorder, predominantly inattentive type: Secondary | ICD-10-CM | POA: Diagnosis not present

## 2019-06-03 MED ORDER — METHYLPHENIDATE HCL ER (OSM) 54 MG PO TBCR: 54.0000 mg | EXTENDED_RELEASE_TABLET | ORAL | 0 refills | Status: DC

## 2019-06-03 NOTE — Progress Notes (Signed)
BH MD/PA/NP OP Progress Note  06/03/2019 3:36 PM Adam Roque Cashodson Jr.  MRN:  [id number] Interview was conducted by phone and I verified that I was speaking with the correct person using two identifiers. I discussed the limitations of evaluation and management by telemedicine and  the availability of in person appointments. Patient expressed understanding and agreed to proceed.  Chief Complaint: "I still have some problems with concentration".  HPI: 33 yo single male with a long hx of anxiety, depression, problems with focusing, concentration, task completion. He has been treated for depression/anxiety and ADD in the past(Ritalin). He has a hx of having SI and admits to one suicidal attempt by cutting in August 2019. He admits to a hx of brief 2-3 day long episodes on increased energy, irritability, racing thoughts, hyperactivity, decreased need for sleep. He also remembers having at times paranoid fears while in a hypomanic episode. 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 His depressive episodes are typically much longer and caused much more occupational and social dysfunction.We planned to changeAbilify to Latudafor bipolar depression, continue Cymbalta for anxiety and increase dose of Lamictal to 200 mg.Patient's insurance Psychologist, occupational(BC/BS) did not approve Latuda so we started low 50 mg dose of Seroquel instead. Patient however could nottolerate it - sedated, tired the next day. We therefore started Rexulti for augmentation of duloxetine which he tolerates well. We also started methylphenidate xr initially 20 mg daily, recently changed to Concerta 36 mg. Adam Larsen reports some improvement in concentration/task completion and also his mood normalized, anxiety subsided, energy and motivation increased. No restlessness or tremor reported. Adam Larsen takes all his meds (except for Concerta) at night and sleep is generally adequate.He feels that he could benefit more from a  sligth increase in Concerta dosing.   Visit Diagnosis:    ICD-10-CM   1. ADHD, predominantly inattentive type  F90.0   2. GAD (generalized anxiety disorder)  F41.1   3. Bipolar 2 disorder (HCC)  F31.81     Past Psychiatric History: Please see intake H&P.  Past Medical History:  Past Medical History:  Diagnosis Date  . Anxiety   . Depression   . [facility](HCC)     Past Surgical History:  Procedure Laterality Date  . WISDOM TOOTH EXTRACTION      Family Psychiatric History: Reviewed.  Family History:  Family History  Problem Relation Age of Onset  . Schizophrenia Paternal Grandmother     Social History:  Social History   Socioeconomic History  . Marital status: Single    Spouse name: Not on file  . Number of children: 0  . Years of education: SOME COLLEGE  . Highest education level: Associate degree: occupational, Scientist, product/process developmenttechnical, or vocational program  Occupational History  . Not on file  Social Needs  . Financial resource strain: Not hard at all  . Food insecurity    Worry: Never true    Inability: Never true  . Transportation needs    Medical: No    Non-medical: No  Tobacco Use  . Smoking status: Never Smoker  . Smokeless tobacco: Never Used  Substance and Sexual Activity  . Alcohol use: Never    Frequency: Never  . Drug use: Never  . Sexual activity: Not Currently  Lifestyle  . Physical activity    Days per week: 0 days    Minutes per session: 0 min  . Stress: Rather much  Relationships  . Social Musicianconnections    Talks on  phone: Not on file    Gets together: Not on file    Attends religious service: Not on file    Active member of club or organization: Not on file    Attends meetings of clubs or organizations: Not on file    Relationship status: Not on file  Other Topics Concern  . Not on file  Social History Narrative  . Not on file    Allergies: No Known Allergies  Metabolic Disorder Labs: No results found for: HGBA1C, MPG No results found  for: PROLACTIN No results found for: CHOL, TRIG, HDL, CHOLHDL, VLDL, LDLCALC No results found for: TSH  Therapeutic Level Labs: No results found for: LITHIUM No results found for: VALPROATE No components found for:  CBMZ  Current Medications: Current Outpatient Medications  Medication Sig Dispense Refill  . Brexpiprazole (REXULTI) 2 MG TABS Take 2 mg by mouth at bedtime. 30 tablet 2  . DULoxetine (CYMBALTA) 60 MG capsule Take 1 capsule (60 mg total) by mouth daily. 90 capsule 0  . lamoTRIgine (LAMICTAL) 200 MG tablet Take 1 tablet (200 mg total) by mouth at bedtime. 90 tablet 0  . methylphenidate 54 MG PO CR tablet Take 1 tablet (54 mg total) by mouth every morning. 30 tablet 0   No current facility-administered medications for this visit.      Psychiatric Specialty Exam: Review of Systems  All other systems reviewed and are negative.   There were no vitals taken for this visit.There is no height or weight on file to calculate BMI.  General Appearance: NA  Eye Contact:  NA  Speech:  Clear and Coherent and Normal Rate  Volume:  Normal  Mood:  Euthymic  Affect:  NA  Thought Process:  Goal Directed  Orientation:  Full (Time, Place, and Person)  Thought Content: Logical   Suicidal Thoughts:  No  Homicidal Thoughts:  No  Memory:  Immediate;   Good Recent;   Good Remote;   Good  Judgement:  Good  Insight:  Good  Psychomotor Activity:  NA  Concentration:  Concentration: Fair  Recall:  Good  Fund of Knowledge: Good  Language: Good  Akathisia:  Negative  Handed:  Right  AIMS (if indicated): not done  Assets:  Communication Skills Desire for Improvement Financial Resources/Insurance Housing Physical Health Talents/Skills  ADL's:  Intact  Cognition: WNL  Sleep:  Good    Assessment and Plan: 33 yo single male with a long hx of anxiety, depression, problems with focusing, concentration, task completion. He has been treated for depression/anxiety and ADD in the  past(Ritalin). He has a hx of having SI and admits to one suicidal attempt by cutting in August 2019. He admits to a hx of brief 2-3 day long episodes on increased energy, irritability, racing thoughts, hyperactivity, decreased need for sleep. He also remembers having at times paranoid fears while in a hypomanic episode. 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 His depressive episodes are typically much longer and caused much more occupational and social dysfunction.We planned to changeAbilify to Latudafor bipolar depression, continue Cymbalta for anxiety and increase dose of Lamictal to 200 mg.Patient's insurance [employer]) did not approve Latuda so we started low 50 mg dose of Seroquel instead. Patient however could nottolerate it - sedated, tired the next day. We therefore started Rexulti for augmentation of duloxetine which he tolerates well. We also started methylphenidate xr initially 20 mg daily, recently changed to Concerta 36 mg.  Jaycion reports some improvement in concentration/task completion and also his mood normalized, anxiety subsided, energy and motivation increased. No restlessness or tremor reported. Joshiah takes all his meds (except for Concerta) at night and sleep is generally adequate.He feels that he could benefit more from a sligth increase in Concerta dosing.  ZO:[id number]:Bipolar 2 disorder most recent episode depressed, GAD, ADD  Plan:Continue Cymbalta 60 mg, Rexulti 2 mg, Lamictal 200 mg at HS and increase Concerta to 54 mg in AM. Next visit in two months. The plan was discussed with patient who had an opportunity to ask questions and these were all answered. I spend 25 minutes in phone consultation with the patient.     Magdalene Patricialgierd A Kase Shughart, MD 06/03/2019, 3:36 PM

## 2019-07-05 ENCOUNTER — Other Ambulatory Visit (HOSPITAL_COMMUNITY): Payer: Self-pay | Admitting: Psychiatry

## 2019-08-03 ENCOUNTER — Ambulatory Visit (INDEPENDENT_AMBULATORY_CARE_PROVIDER_SITE_OTHER): Payer: BC Managed Care – PPO | Admitting: Psychiatry

## 2019-08-03 ENCOUNTER — Other Ambulatory Visit: Payer: Self-pay

## 2019-08-03 DIAGNOSIS — F3181 Bipolar II disorder: Secondary | ICD-10-CM | POA: Diagnosis not present

## 2019-08-03 DIAGNOSIS — F411 Generalized anxiety disorder: Secondary | ICD-10-CM

## 2019-08-03 DIAGNOSIS — F900 Attention-deficit hyperactivity disorder, predominantly inattentive type: Secondary | ICD-10-CM | POA: Diagnosis not present

## 2019-08-03 MED ORDER — DULOXETINE HCL 30 MG PO CPEP: 90.0000 mg | ORAL_CAPSULE | Freq: Every day | ORAL | 0 refills | Status: DC

## 2019-08-03 MED ORDER — METHYLPHENIDATE HCL ER (OSM) 54 MG PO TBCR: 54.0000 mg | EXTENDED_RELEASE_TABLET | Freq: Every morning | ORAL | 0 refills | Status: DC

## 2019-08-03 MED ORDER — LAMOTRIGINE 200 MG PO TABS: 200.0000 mg | ORAL_TABLET | Freq: Every day | ORAL | 0 refills | Status: DC

## 2019-08-03 NOTE — Progress Notes (Signed)
BH MD/PA/NP OP Progress Note  08/03/2019 2:38 PM Adam Larsen.  MRN:  [id number] Interview was conducted by phone and I verified that I was speaking with the correct person using two identifiers. I discussed the limitations of evaluation and management by telemedicine and  the availability of in person appointments. Patient expressed understanding and agreed to proceed.  Chief Complaint: Increased appetite and weight gain.   HPI: 33 yo single malewitha long hx of anxiety, depression, problems with focusing, concentration, task completion. He has been treated for depression/anxiety and ADD in the past(Ritalin). He has a hx of having SI and admits to one suicidal attempt by cutting in August 2019. He admits to a hx of brief 2-3 day long episodes on increased energy, irritability, racing thoughts, hyperactivity, decreased need for sleep. He also remembers having at times paranoid fears while in a hypomanic episode. 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 His depressive episodes are typically much longer and caused much more occupational and social dysfunction.We planned to changeAbilify to Latudafor bipolar depression, continue Cymbalta for anxiety and increase dose of Lamictal to 200 mg.Patient's insurance Psychologist, occupational) did not approve Latuda so we started low 50 mg dose of Seroquel instead. Patient however could nottolerate it - sedated, tired the next day.We therefore started Rexulti for augmentation of duloxetine which he tolerates well but has been gradually gaining weight! We also started methylphenidate xr initially 20 mg daily, then changed to Concerta now at 54 mg strength. Adam Larsen reports much improvement in his focusing ability. 36 mg. Adam Larsen reports that his mood normalized, anxiety subsided, energy and motivation increased.No restlessnessortremor reported. Adam Larsen takes all his meds (except for Concerta)at night and sleep is generally  adequate.  Visit Diagnosis:    ICD-10-CM   1. ADHD, predominantly inattentive type  F90.0   2. GAD (generalized anxiety disorder)  F41.1   3. Bipolar 2 disorder (HCC)  F31.81     Past Psychiatric History: Please see intake H&P.  Past Medical History:  Past Medical History:  Diagnosis Date  . Anxiety   . Depression   . Mania [facility])     Past Surgical History:  Procedure Laterality Date  . WISDOM TOOTH EXTRACTION      Family Psychiatric History: Reviewed.  Family History:  Family History  Problem Relation Age of Onset  . Schizophrenia Paternal Grandmother     Social History:  Social History   Socioeconomic History  . Marital status: Single    Spouse name: Not on file  . Number of children: 0  . Years of education: SOME COLLEGE  . Highest education level: Associate degree: occupational, Scientist, product/process development, or vocational program  Occupational History  . Not on file  Social Needs  . Financial resource strain: Not hard at all  . Food insecurity    Worry: Never true    Inability: Never true  . Transportation needs    Medical: No    Non-medical: No  Tobacco Use  . Smoking status: Never Smoker  . Smokeless tobacco: Never Used  Substance and Sexual Activity  . Alcohol use: Never    Frequency: Never  . Drug use: Never  . Sexual activity: Not Currently  Lifestyle  . Physical activity    Days per week: 0 days    Minutes per session: 0 min  . Stress: Rather much  Relationships  . Social Musician on phone: Not on file    Gets together:  Not on file    Attends religious service: Not on file    Active member of club or organization: Not on file    Attends meetings of clubs or organizations: Not on file    Relationship status: Not on file  Other Topics Concern  . Not on file  Social History Narrative  . Not on file    Allergies: No Known Allergies  Metabolic Disorder Labs: No results found for: HGBA1C, MPG No results found for: PROLACTIN No results  found for: CHOL, TRIG, HDL, CHOLHDL, VLDL, LDLCALC No results found for: TSH  Therapeutic Level Labs: No results found for: LITHIUM No results found for: VALPROATE No components found for:  CBMZ  Current Medications: Current Outpatient Medications  Medication Sig Dispense Refill  . DULoxetine (CYMBALTA) 30 MG capsule Take 3 capsules (90 mg total) by mouth at bedtime. 270 capsule 0  . lamoTRIgine (LAMICTAL) 200 MG tablet Take 1 tablet (200 mg total) by mouth at bedtime. 90 tablet 0  . methylphenidate (CONCERTA) 54 MG PO CR tablet Take 1 tablet (54 mg total) by mouth every morning. 30 tablet 0   No current facility-administered medications for this visit.      Psychiatric Specialty Exam: Review of Systems  Psychiatric/Behavioral: The patient is nervous/anxious.   All other systems reviewed and are negative.   There were no vitals taken for this visit.There is no height or weight on file to calculate BMI.  General Appearance: NA  Eye Contact:  NA  Speech:  Clear and Coherent and Normal Rate  Volume:  Normal  Mood:  Mild anxiety  Affect:  NA  Thought Process:  Goal Directed and Linear  Orientation:  Full (Time, Place, and Person)  Thought Content: Logical   Suicidal Thoughts:  No  Homicidal Thoughts:  No  Memory:  Immediate;   Good Recent;   Good Remote;   Good  Judgement:  Good  Insight:  Fair  Psychomotor Activity:  NA  Concentration:  Concentration: Good  Recall:  Good  Fund of Knowledge: Good  Language: Good  Akathisia:  Negative  Handed:  Right  AIMS (if indicated): not done  Assets:  Communication Skills Desire for Improvement Financial Resources/Insurance Housing Social Support  ADL's:  Intact  Cognition: WNL  Sleep:  Good    Assessment and Plan: 33 yo single malewitha long hx of anxiety, depression, problems with focusing, concentration, task completion. He has been treated for depression/anxiety and ADD in the past(Ritalin). He has a hx of having SI  and admits to one suicidal attempt by cutting in August 2019. He admits to a hx of brief 2-3 day long episodes on increased energy, irritability, racing thoughts, hyperactivity, decreased need for sleep. He also remembers having at times paranoid fears while in a hypomanic episode. 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 His depressive episodes are typically much longer and caused much more occupational and social dysfunction.We planned to changeAbilify to Latudafor bipolar depression, continue Cymbalta for anxiety and increase dose of Lamictal to 200 mg.Patient's insurance Psychologist, occupational([employer]) did not approve Latuda so we started low 50 mg dose of Seroquel instead. Patient however could nottolerate it - sedated, tired the next day.We therefore started Rexulti for augmentation of duloxetine which he tolerates well but has been gradually gaining weight! We also started methylphenidate xr initially 20 mg daily, then changed to Concerta now at 54 mg strength. Adam Larsen reports much improvement in his focusing ability. 36  mg. Adam Larsen reports that his mood normalized, anxiety subsided, energy and motivation increased.No restlessnessortremor reported. Adam Larsen takes all his meds (except for Concerta)at night and sleep is generally adequate.  QQ:VZDGLOV 2 disorder most recent episode depressed, GAD, ADD  Plan:Discontinue Rexulti and instead increase Cymbalta to 90 mg . Continue Lamictal 200 mg at [facility] Concerta to 54 mg in AM.Next visit in one month.The plan was discussed with patient who had an opportunity to ask questions and these were all answered. I spend25 minutes inphone consultation with the patient.    Stephanie Acre, MD 08/03/2019, 2:38 PM

## 2019-08-12 ENCOUNTER — Telehealth (HOSPITAL_COMMUNITY): Payer: Self-pay

## 2019-08-12 NOTE — Telephone Encounter (Signed)
Patients mother called on behalf of patient, she states that since coming off of Risperidone he has become increasingly paranoid. She states that the Cymbalta increase did not help at all. Please review and advise, thank you

## 2019-08-13 ENCOUNTER — Other Ambulatory Visit (HOSPITAL_COMMUNITY): Payer: Self-pay | Admitting: Psychiatry

## 2019-08-13 MED ORDER — RISPERIDONE 2 MG PO TABS: 2.0000 mg | ORAL_TABLET | Freq: Every day | ORAL | 1 refills | Status: DC

## 2019-08-13 NOTE — Telephone Encounter (Signed)
I saw that he was on Rexulti which we stopped because of weight gain. I cannot see any notes about  Risperidone and do not remember why he came off it and when. I he is increasingly paranoid he should resume risperidone at bedtime only - send rx for 2 mg to [facility].  I still cannot approve Rx for controlled substances - applications on cell phone has not been fixed as yet!

## 2019-08-20 ENCOUNTER — Other Ambulatory Visit (HOSPITAL_COMMUNITY): Payer: Self-pay | Admitting: Psychiatry

## 2019-08-20 ENCOUNTER — Telehealth (HOSPITAL_COMMUNITY): Payer: Self-pay

## 2019-08-20 MED ORDER — ZIPRASIDONE HCL 40 MG PO CAPS: 40.0000 mg | ORAL_CAPSULE | Freq: Every day | ORAL | 1 refills | Status: DC

## 2019-08-20 NOTE — Telephone Encounter (Signed)
Patient called and stated that he's not taking the Risperidone because it's causing him to grow male breasts and interacts with the Cymbalta. He would like to know if he can get something else. Please review and advise. Thank you.

## 2019-08-20 NOTE — Telephone Encounter (Signed)
I ordered a low dose of Geodon at bedtime instead.

## 2019-08-23 NOTE — Telephone Encounter (Signed)
Called patient and there was no answer/LVM and relayed message

## 2019-08-26 ENCOUNTER — Other Ambulatory Visit (HOSPITAL_COMMUNITY): Payer: Self-pay | Admitting: Psychiatry

## 2019-08-26 ENCOUNTER — Telehealth (HOSPITAL_COMMUNITY): Payer: Self-pay

## 2019-08-26 MED ORDER — ZIPRASIDONE HCL 60 MG PO CAPS: 60.0000 mg | ORAL_CAPSULE | Freq: Every day | ORAL | 1 refills | Status: DC

## 2019-08-26 NOTE — Telephone Encounter (Signed)
Patients mother is calling, she said that when he started out on the Geodon it worked really well, patient was sleeping and happy. However now it seems that when the medication is wearing off he is becoming very depressed and suicidal. Mom is concerned and is not sure if it is from the Cymbalta increase, the Geodon wearing off or if it is something else. Please review and advise, thank you

## 2019-08-26 NOTE — Telephone Encounter (Signed)
I doubt it is because of Cymbalta dose increase - he is on a low 40 mg dose of Geodon though. I will change Rx to 60 mg at bedtime.

## 2019-08-27 NOTE — Telephone Encounter (Signed)
I called patients mother and let her know about the dose change.

## 2019-09-03 ENCOUNTER — Ambulatory Visit (INDEPENDENT_AMBULATORY_CARE_PROVIDER_SITE_OTHER): Payer: BC Managed Care – PPO | Admitting: Psychiatry

## 2019-09-03 ENCOUNTER — Other Ambulatory Visit (HOSPITAL_COMMUNITY): Payer: Self-pay | Admitting: Psychiatry

## 2019-09-03 ENCOUNTER — Other Ambulatory Visit: Payer: Self-pay

## 2019-09-03 DIAGNOSIS — F411 Generalized anxiety disorder: Secondary | ICD-10-CM | POA: Diagnosis not present

## 2019-09-03 DIAGNOSIS — F3181 Bipolar II disorder: Secondary | ICD-10-CM

## 2019-09-03 DIAGNOSIS — F900 Attention-deficit hyperactivity disorder, predominantly inattentive type: Secondary | ICD-10-CM | POA: Diagnosis not present

## 2019-09-03 MED ORDER — METHYLPHENIDATE HCL ER (OSM) 54 MG PO TBCR: 54.0000 mg | EXTENDED_RELEASE_TABLET | Freq: Every morning | ORAL | 0 refills | Status: DC

## 2019-09-03 MED ORDER — LAMOTRIGINE 150 MG PO TABS: 300.0000 mg | ORAL_TABLET | Freq: Every day | ORAL | 0 refills | Status: DC

## 2019-09-03 NOTE — Progress Notes (Signed)
BH MD/PA/NP OP Progress Note  09/03/2019 11:53 AM Adam Coralee Rud.  MRN:  [id number] Interview was conducted by phone and I verified that I was speaking with the correct person using two identifiers. I discussed the limitations of evaluation and management by telemedicine and  the availability of in person appointments. Patient expressed understanding and agreed to proceed.  Chief Complaint: Mood fluctuations.  HPI: 33 yo single malewitha long hx of anxiety, depression, problems with focusing, concentration, task completion. He has been treated for depression/anxiety and ADD in the past(Ritalin). He has a hx of having SI and admits to one suicidal attempt by cutting in August 2019. He admits to a hx of brief 2-3 day long episodes on increased energy, irritability, racing thoughts, hyperactivity, decreased need for sleep. He also remembers having at times paranoid fears while in a hypomanic episode. 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 His depressive episodes are typically much longer and caused much more occupational and social dysfunction.We planned to changeAbilify to Latudafor bipolar depression, continue Cymbalta for anxiety and increase dose of Lamictal to 200 mg.Patient's insurance [employer]) did not approve Latuda so we started low 50 mg dose of Seroquel instead. Patient however could nottolerate it - sedated, tired the next day.We therefore started Rexulti for augmentation of duloxetine which he tolerates well but has been gradually gaining weight! We also started methylphenidate xr initially 20 mg daily, then changed to Concerta now at 54 mg strength. Adam Larsen reports much improvement in his focusing ability. Adam Larsen reportsthat approximately two weeks ago he felt very paranoid and depressed - his mother called the office and we have increased dose of Geodon to 60 mg at HS. He states that paranoia has resolved and that he is less depressed but  his mood is fluctuating rapidly. His anxiety subsided. No restlessnessortremor reported. Adam Larsen takes all his meds (except for Concerta)at night and sleep is generally adequate.   Visit Diagnosis:    ICD-10-CM   1. Bipolar 2 disorder (HCC)  F31.81   2. GAD (generalized anxiety disorder)  F41.1   3. ADHD, predominantly inattentive type  F90.0     Past Psychiatric History: Please see intake H&P.  Past Medical History:  Past Medical History:  Diagnosis Date  . Anxiety   . Depression   . Mania [facility])     Past Surgical History:  Procedure Laterality Date  . WISDOM TOOTH EXTRACTION      Family Psychiatric History: Reviewed.  Family History:  Family History  Problem Relation Age of Onset  . Schizophrenia Paternal Grandmother     Social History:  Social History   Socioeconomic History  . Marital status: Single    Spouse name: Not on file  . Number of children: 0  . Years of education: SOME COLLEGE  . Highest education level: Associate degree: occupational, Hotel manager, or vocational program  Occupational History  . Not on file  Social Needs  . Financial resource strain: Not hard at all  . Food insecurity    Worry: Never true    Inability: Never true  . Transportation needs    Medical: No    Non-medical: No  Tobacco Use  . Smoking status: Never Smoker  . Smokeless tobacco: Never Used  Substance and Sexual Activity  . Alcohol use: Never    Frequency: Never  . Drug use: Never  . Sexual activity: Not Currently  Lifestyle  . Physical activity    Days per week:  0 days    Minutes per session: 0 min  . Stress: Rather much  Relationships  . Social Musician on phone: Not on file    Gets together: Not on file    Attends religious service: Not on file    Active member of club or organization: Not on file    Attends meetings of clubs or organizations: Not on file    Relationship status: Not on file  Other Topics Concern  . Not on file  Social  History Narrative  . Not on file    Allergies: No Known Allergies  Metabolic Disorder Labs: No results found for: HGBA1C, MPG No results found for: PROLACTIN No results found for: CHOL, TRIG, HDL, CHOLHDL, VLDL, LDLCALC No results found for: TSH  Therapeutic Level Labs: No results found for: LITHIUM No results found for: VALPROATE No components found for:  CBMZ  Current Medications: Current Outpatient Medications  Medication Sig Dispense Refill  . DULoxetine (CYMBALTA) 30 MG capsule Take 3 capsules (90 mg total) by mouth at bedtime. 270 capsule 0  . lamoTRIgine (LAMICTAL) 150 MG tablet Take 2 tablets (300 mg total) by mouth at bedtime. 180 tablet 0  . methylphenidate (CONCERTA) 54 MG PO CR tablet Take 1 tablet (54 mg total) by mouth every morning. 30 tablet 0  . ziprasidone (GEODON) 60 MG capsule Take 1 capsule (60 mg total) by mouth at bedtime. Take with supper. 30 capsule 1   No current facility-administered medications for this visit.       Psychiatric Specialty Exam: Review of Systems  Psychiatric/Behavioral: Positive for depression.  All other systems reviewed and are negative.   There were no vitals taken for this visit.There is no height or weight on file to calculate BMI.  General Appearance: NA  Eye Contact:  NA  Speech:  Clear and Coherent and Normal Rate  Volume:  Normal  Mood:  Varies  Affect:  NA  Thought Process:  Goal Directed and Linear  Orientation:  Full (Time, Place, and Person)  Thought Content: Logical   Suicidal Thoughts:  No  Homicidal Thoughts:  No  Memory:  Immediate;   Good Recent;   Good Remote;   Good  Judgement:  Good  Insight:  Good  Psychomotor Activity:  NA  Concentration:  Concentration: Good  Recall:  Good  Fund of Knowledge: Good  Language: Good  Akathisia:  Negative  Handed:  Right  AIMS (if indicated): not done  Assets:  Communication Skills Desire for Improvement Financial Resources/Insurance Housing Social  Support Talents/Skills  ADL's:  Intact  Cognition: WNL  Sleep:  Fair   Assessment and Plan: 33 yo single malewitha long hx of anxiety, depression, problems with focusing, concentration, task completion. He has been treated for depression/anxiety and ADD in the past(Ritalin). He has a hx of having SI and admits to one suicidal attempt by cutting in August 2019. He admits to a hx of brief 2-3 day long episodes on increased energy, irritability, racing thoughts, hyperactivity, decreased need for sleep. He also remembers having at times paranoid fears while in a hypomanic episode. 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 His depressive episodes are typically much longer and caused much more occupational and social dysfunction.We planned to changeAbilify to Latudafor bipolar depression, continue Cymbalta for anxiety and increase dose of Lamictal to 200 mg.Patient's insurance Psychologist, occupational) did not approve Latuda so we started low 50 mg dose of Seroquel  instead. Patient however could nottolerate it - sedated, tired the next day.We therefore started Rexulti for augmentation of duloxetine which he tolerates well but has been gradually gaining weight! We also started methylphenidate xr initially 20 mg daily, then changed to Concerta now at 54 mg strength. Adam Larsen reports much improvement in his focusing ability. Adam Larsen reportsthat approximately two weeks ago he felt very paranoid and depressed - his mother called the office and we have increased dose of Geodon to 60 mg at HS. He states that paranoia has resolved and that he is less depressed but his mood is fluctuating rapidly. His anxiety subsided. No restlessnessortremor reported. Adam Larsen takes all his meds (except for Concerta)at night and sleep is generally adequate.  WU:[id number]:Bipolar 2 disordermost recent episodedepressed, GAD, ADD  Plan:Continue Cymbalta to 90 mg, Geodon 60 mg at HS and increase Lamictal to  300 mg at HS. If mood does not improve in one week he will change Lamictal to 150 mg bid and if this does not help we may need to decrease dose of Cymbalta to 60 mg and possibly increase Geodon further to 80 mg. Continue Concertato 54mg  in AM.Next visit in5 weeks.The plan was discussed with patient who had an opportunity to ask questions and these were all answered. I spend25 minutes inphone consultation with the patient.   Magdalene Patricialgierd A Naitik Hermann, MD 09/03/2019, 11:53 AM

## 2019-09-06 ENCOUNTER — Other Ambulatory Visit: Payer: Self-pay

## 2019-09-20 ENCOUNTER — Telehealth (HOSPITAL_COMMUNITY): Payer: Self-pay | Admitting: *Deleted

## 2019-09-20 NOTE — Telephone Encounter (Signed)
Received message from father regarding increasing geodon. Please review and advise.

## 2019-09-20 NOTE — Telephone Encounter (Signed)
But I do not see what the message was so it is difficult for me to review it?

## 2019-09-21 ENCOUNTER — Other Ambulatory Visit (HOSPITAL_COMMUNITY): Payer: Self-pay | Admitting: Psychiatry

## 2019-09-21 ENCOUNTER — Telehealth (HOSPITAL_COMMUNITY): Payer: Self-pay | Admitting: *Deleted

## 2019-09-21 MED ORDER — METHYLPHENIDATE HCL ER (OSM) 54 MG PO TBCR: 54.0000 mg | EXTENDED_RELEASE_TABLET | Freq: Every morning | ORAL | 0 refills | Status: DC

## 2019-09-21 NOTE — Telephone Encounter (Signed)
Mother called due to pt c/o chest pressure and feeling as if he's going to pass out. Both pt and mother feel this is related to Geodon and would like medication advice.

## 2019-09-21 NOTE — Telephone Encounter (Signed)
Mother called today saying she thought the Geodon was working but now is saying pt is experiencing c.p. and feeling as though he will "pass out" related to [place].

## 2019-09-21 NOTE — Telephone Encounter (Signed)
Yes of course

## 2019-09-21 NOTE — Telephone Encounter (Signed)
I have been trying to call [facility] today but he is not picking up and there is no option to leave a message. I suggest he stops Geodon to see if these symptoms resolve. I wanted to see if his paranoia has resolved while on Geodon because if not we may want to try a different antipsychotic. I will keep on trying to reach him as I would much prefer to discuss other options with the patient not parents.

## 2019-09-22 ENCOUNTER — Other Ambulatory Visit (HOSPITAL_COMMUNITY): Payer: Self-pay | Admitting: Psychiatry

## 2019-09-22 ENCOUNTER — Telehealth (HOSPITAL_COMMUNITY): Payer: Self-pay | Admitting: *Deleted

## 2019-09-22 MED ORDER — CARIPRAZINE HCL 1.5 MG PO CAPS: 1.5000 mg | ORAL_CAPSULE | Freq: Every day | ORAL | 0 refills | Status: DC

## 2019-09-22 NOTE — Telephone Encounter (Signed)
Father called back stating that provider called but he did not recognize the number therefore did not pick up. Awaiting return call.

## 2019-09-22 NOTE — Telephone Encounter (Signed)
I have finally reached them. We will stop  ziprasidone and start cariprazine (Vraylar) 1.5 mg daily. I suspect it will not be approved by his insurance (they refused to approve Latuda) but this is a medicine of choice for him. I spoke with drug rep earlier this week and he said they will continue to provide [place] with samples as needed. Will you prepare a month works of 1.5 mg samples of Vraylar for Tramane or parents to pick up?

## 2019-09-22 NOTE — Telephone Encounter (Signed)
Yes. I was able to reach him at the [phone] number.

## 2019-09-22 NOTE — Telephone Encounter (Signed)
Outpatient and Clinic-Administered Medications   DULoxetine (CYMBALTA) 30 MG capsule 90 mg, Daily at bedtime 0 ordered       lamoTRIgine (LAMICTAL) 150 MG tablet 300 mg, Daily at bedtime 0 ordered       methylphenidate (CONCERTA) 54 MG PO CR tablet 54 mg, Every morning - 10a 0 ordered       ziprasidone (GEODON) 60 MG capsule         Father called and left message regarding side effects from possibly "drug interaction". Writer called father back, with mother on speaker, and they describe pt s/s as "dizziness", "fogginess", with "chest pressure" and change in sleep pattern. Mother did state that she has changed pt Geodon to 40mg  bid and had been thinking of increasing the 60mg  to bid. Writer advised not to increase dosage. They are requesting a call from you as soon as you are able.

## 2019-09-22 NOTE — Telephone Encounter (Signed)
I have been calling Adam Larsen's cell and getting no answer (yesterday and today). I then tried  Parents' phone and again nobody is answering. I also left a message earlier that I will call at noon but nobody is picking up again? It is quite frustrating when someone is calling for "help" and then is not available to talk.

## 2019-09-23 NOTE — Telephone Encounter (Signed)
Yes I will. I'll let dad know I've got them Ready to pick up.

## 2019-09-23 NOTE — Telephone Encounter (Signed)
I hope he responds to Vraylar and it is approved (otherwise we should provide him with samples). He has failed, Abilify, Rexulti (weight gain) and Latuda was not approved by his insurance previously. He is suffering from bipolar depression.

## 2019-09-23 NOTE — Telephone Encounter (Signed)
Medication management - Prior authorization submitted online with CoverMyMeds for pt's newly prescribed Vraylar 1.5 mg Capsules.  Results pending review.

## 2019-09-23 NOTE — Telephone Encounter (Signed)
Father picked up one month supply Vraylar 1.5mg  samples.

## 2019-09-24 NOTE — Telephone Encounter (Signed)
Medication management - Fax received which approved patient's Vraylar 1.5 mg capsules from 09/23/19-09/21/2022, reference # [place].  Medication management - Telephone call with pt's Father to inform pt's Arman Filter was approved by [facility] [place].  Collateral to check with his pharmacy to see how much this would cost per month as pt got samples 09/23/19 to last 4 weeks.  Collateral will let us know if any problems getting future refills from pharmacy.

## 2019-09-24 NOTE — Telephone Encounter (Signed)
This is a pleasant surprise!

## 2019-10-12 ENCOUNTER — Other Ambulatory Visit: Payer: Self-pay

## 2019-10-12 ENCOUNTER — Ambulatory Visit (INDEPENDENT_AMBULATORY_CARE_PROVIDER_SITE_OTHER): Payer: BC Managed Care – PPO | Admitting: Psychiatry

## 2019-10-12 DIAGNOSIS — F3181 Bipolar II disorder: Secondary | ICD-10-CM | POA: Diagnosis not present

## 2019-10-12 DIAGNOSIS — F900 Attention-deficit hyperactivity disorder, predominantly inattentive type: Secondary | ICD-10-CM | POA: Diagnosis not present

## 2019-10-12 DIAGNOSIS — F411 Generalized anxiety disorder: Secondary | ICD-10-CM

## 2019-10-12 MED ORDER — CARIPRAZINE HCL 3 MG PO CAPS: 3.0000 mg | ORAL_CAPSULE | Freq: Every day | ORAL | 0 refills | Status: DC

## 2019-10-12 MED ORDER — METHYLPHENIDATE HCL ER (OSM) 54 MG PO TBCR: 54.0000 mg | EXTENDED_RELEASE_TABLET | Freq: Every morning | ORAL | 0 refills | Status: DC

## 2019-10-12 MED ORDER — DULOXETINE HCL 30 MG PO CPEP: 90.0000 mg | ORAL_CAPSULE | Freq: Every day | ORAL | 0 refills | Status: DC

## 2019-10-12 NOTE — Progress Notes (Signed)
BH MD/PA/NP OP Progress Note  10/12/2019 2:40 PM Adam Larsen.  MRN:  [id number] Interview was conducted by phone and I verified that I was speaking with the correct person using two identifiers. I discussed the limitations of evaluation and management by telemedicine and  the availability of in person appointments. Patient expressed understanding and agreed to proceed.  Chief Complaint: "I am beginning to feel better, more stable".  HPI: 33yo single malewitha long hx of anxiety, depression, problems with focusing, concentration, task completion. He has been treated for depression/anxiety and ADD in the past(Ritalin). He has a hx of having SI and admits to one suicidal attempt by cutting in August 2019. He admits to a hx of brief 2-3 day long episodes on increased energy, irritability, racing thoughts, hyperactivity, decreased need for sleep. He also remembers having at times paranoid fears while in a hypomanic episode. 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 His depressive episodes are typically much longer and caused much more occupational and social dysfunction.We planned to changeAbilify to Latudafor bipolar depression, continue Cymbalta for anxiety and increase dose of Lamictal to 200 mg.Patient's insurance Psychologist, occupational) did not approve Latuda so we started low 50 mg dose of Seroquel instead. Patient however could nottolerate it - sedated, tired the next day.We therefore started Rexulti for augmentation of duloxetine which he tolerates wellbut has been gradually gaining weight!We also started methylphenidate xr initially 20 mg daily, then changed toConcertanow at 54 mg strength. Adam Larsen reports much improvement in his focusing ability. Adam Larsen started to feel very paranoid and depressed with rapid mood swings in late November - his mother called the office and we have increased dose of Geodon to 60 mg at HS and then to 80 mg. He however could not  tolerate it - reported feeling dizzy and having "chest tightness". We stopped ziprasidone and started 1.5 mg of  cariprazine which he tolerates well and noticed his mood to beginning to stabilize (paranoia resolved). Even though it has been approved by his insurance price is prohibitive so we have been giving him samples.  Visit Diagnosis:    ICD-10-CM   1. Bipolar 2 disorder (HCC)  F31.81   2. GAD (generalized anxiety disorder)  F41.1   3. ADHD, predominantly inattentive type  F90.0     Past Psychiatric History: Please  See intake H&P.  Past Medical History:  Past Medical History:  Diagnosis Date  . Anxiety   . Depression   . [facility])     Past Surgical History:  Procedure Laterality Date  . WISDOM TOOTH EXTRACTION      Family Psychiatric History: Reviewed.  Family History:  Family History  Problem Relation Age of Onset  . Schizophrenia Paternal Grandmother     Social History:  Social History   Socioeconomic History  . Marital status: Single    Spouse name: Not on file  . Number of children: 0  . Years of education: SOME COLLEGE  . Highest education level: Associate degree: occupational, Scientist, product/process development, or vocational program  Occupational History  . Not on file  Tobacco Use  . Smoking status: Never Smoker  . Smokeless tobacco: Never Used  Substance and Sexual Activity  . Alcohol use: Never  . Drug use: Never  . Sexual activity: Not Currently  Other Topics Concern  . Not on file  Social History Narrative  . Not on file   Social Determinants of Health   Financial Resource Strain: Low Risk   .  Difficulty of Paying Living Expenses: Not hard at all  Food Insecurity: No Food Insecurity  . Worried About Charity fundraiser in the Last Year: Never true  . Ran Out of Food in the Last Year: Never true  Transportation Needs: No Transportation Needs  . Lack of Transportation (Medical): No  . Lack of Transportation (Non-Medical): No  Physical Activity: Inactive  .  Days of Exercise per Week: 0 days  . Minutes of Exercise per Session: 0 min  Stress: Stress Concern Present  . Feeling of Stress : Rather much  Social Connections:   . Frequency of Communication with Friends and Family: Not on file  . Frequency of Social Gatherings with Friends and Family: Not on file  . Attends Religious Services: Not on file  . Active Member of Clubs or Organizations: Not on file  . Attends Archivist Meetings: Not on file  . Marital Status: Not on file    Allergies: No Known Allergies  Metabolic Disorder Labs: No results found for: HGBA1C, MPG No results found for: PROLACTIN No results found for: CHOL, TRIG, HDL, CHOLHDL, VLDL, LDLCALC No results found for: TSH  Therapeutic Level Labs: No results found for: LITHIUM No results found for: VALPROATE No components found for:  CBMZ  Current Medications: Current Outpatient Medications  Medication Sig Dispense Refill  . cariprazine (VRAYLAR) capsule Take 1 capsule (3 mg total) by mouth daily. 30 capsule 0  . [START ON 11/01/2019] DULoxetine (CYMBALTA) 30 MG capsule Take 3 capsules (90 mg total) by mouth at bedtime. 270 capsule 0  . lamoTRIgine (LAMICTAL) 150 MG tablet Take 2 tablets (300 mg total) by mouth at bedtime. 180 tablet 0  . [START ON 11/01/2019] methylphenidate (CONCERTA) 54 MG PO CR tablet Take 1 tablet (54 mg total) by mouth every morning. 30 tablet 0   No current facility-administered medications for this visit.     Psychiatric Specialty Exam: Review of Systems  Psychiatric/Behavioral: The patient is nervous/anxious.   All other systems reviewed and are negative.   There were no vitals taken for this visit.There is no height or weight on file to calculate BMI.  General Appearance: NA  Eye Contact:  NA  Speech:  Clear and Coherent and Normal Rate  Volume:  Normal  Mood:  Less depressed and less anxious,  Affect:  NA  Thought Process:  Goal Directed and Linear  Orientation:  Full  (Time, Place, and Person)  Thought Content: Logical   Suicidal Thoughts:  No  Homicidal Thoughts:  No  Memory:  Immediate;   Good Recent;   Good Remote;   Good  Judgement:  Good  Insight:  Good  Psychomotor Activity:  NA  Concentration:  Concentration: Good  Recall:  Good  Fund of Knowledge: Good  Language: Good  Akathisia:  Negative  Handed:  Right  AIMS (if indicated): not done  Assets:  Communication Skills Desire for Improvement Financial Resources/Insurance Housing Social Support Talents/Skills  ADL's:  Intact  Cognition: WNL  Sleep:  Fair     Assessment and Plan: 33yo single malewitha long hx of anxiety, depression, problems with focusing, concentration, task completion. He has been treated for depression/anxiety and ADD in the past(Ritalin). He has a hx of having SI and admits to one suicidal attempt by cutting in August 2019. He admits to a hx of brief 2-3 day long episodes on increased energy, irritability, racing thoughts, hyperactivity, decreased need for sleep. He also remembers having at times paranoid fears while  in a hypomanic episode. 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 His depressive episodes are typically much longer and caused much more occupational and social dysfunction.We planned to changeAbilify to Latudafor bipolar depression, continue Cymbalta for anxiety and increase dose of Lamictal to 200 mg.Patient's insurance Psychologist, occupational([employer]) did not approve Latuda so we started low 50 mg dose of Seroquel instead. Patient however could nottolerate it - sedated, tired the next day.We therefore started Rexulti for augmentation of duloxetine which he tolerates wellbut has been gradually gaining weight!We also started methylphenidate xr initially 20 mg daily, then changed toConcertanow at 54 mg strength. Adam Larsen reports much improvement in his focusing ability. Adam Larsen started to feel very paranoid and depressed with rapid mood  swings in late November - his mother called the office and we have increased dose of Geodon to 60 mg at HS and then to 80 mg. He however could not tolerate it - reported feeling dizzy and having "chest tightness". We stopped ziprasidone and started 1.5 mg of  cariprazine which he tolerates well and noticed his mood to beginning to stabilize (paranoia resolved). Even though it has been approved by his insurance price is prohibitive so we have been giving him samples.  ZO:[id number]:Bipolar 2 disordermost recent episodedepressed, GAD, ADD  Plan:Continue Cymbaltato 90 mg, Lamictal to 300 mg at HS, Concerta 54 mg and increase Vraylar to 3 mg in PM.Next visit in4 weeks.The plan was discussed with patient who had an opportunity to ask questions and these were all answered. I spend25 minutes inphone consultation with the patient.    Magdalene Patricialgierd A Berlene Dixson, MD 10/12/2019, 2:40 PM

## 2019-11-11 ENCOUNTER — Ambulatory Visit (INDEPENDENT_AMBULATORY_CARE_PROVIDER_SITE_OTHER): Payer: Medicaid Other | Admitting: Psychiatry

## 2019-11-11 ENCOUNTER — Other Ambulatory Visit: Payer: Self-pay

## 2019-11-11 DIAGNOSIS — F411 Generalized anxiety disorder: Secondary | ICD-10-CM

## 2019-11-11 DIAGNOSIS — F3181 Bipolar II disorder: Secondary | ICD-10-CM

## 2019-11-11 NOTE — Progress Notes (Signed)
BH MD/PA/NP OP Progress Note  11/11/2019 2:41 PM Adam Larsen.  MRN:  [id number] Interview was conducted by phone and I verified that I was speaking with the correct person using two identifiers. I discussed the limitations of evaluation and management by telemedicine and  the availability of in person appointments. Patient expressed understanding and agreed to proceed.  Chief Complaint: "I am doing well".  HPI: 33yo single malewitha long hx of anxiety, depression, problems with focusing, concentration, task completion. He has been treated for depression/anxiety and ADD in the past(Ritalin). He has a hx of having SI and admits to one suicidal attempt by cutting in August 2019. He admits to a hx of brief 2-3 day long episodes on increased energy, irritability, racing thoughts, hyperactivity, decreased need for sleep. He also remembers having at times paranoid fears while in a hypomanic episode. 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 His depressive episodes are typically much longer and caused much more occupational and social dysfunction.We planned to changeAbilify to Latudafor bipolar depression, continue Cymbalta for anxiety and increase dose of Lamictal to 200 mg.Patient's insurance Psychologist, occupational) did not approve Latuda so we started low 50 mg dose of Seroquel instead. Patient however could nottolerate it - sedated, tired the next day.We therefore started Rexulti for augmentation of duloxetine which he tolerates wellbut has been gradually gaining weight!We also started methylphenidate xr initially 20 mg daily, then changed toConcertanow at 54 mg strength. Adam Larsen reports much improvement in his focusing ability. Adam Larsen started to feel very paranoid and depressed with rapid mood swings in late November - his mother called the office and we have increased dose of Geodon to 60 mg at HS and then to 80 mg. He however could not tolerate it - reported  feeling dizzy and having "chest tightness". We stopped ziprasidone and started 1.5 mg of  cariprazine which he tolerates well and noticed his mood to beginning to stabilize (paranoia resolved). Even though it has been approved by his insurance price is prohibitive so we have been giving him samples. His mood has stabilized but he now would like to get off as many medications as possible while keeping Concerta. I managed to convince Adam Larsen to stay on Lamictal (agreed to that but asked for a lower dose).  EL:[id number] 2 disordermost recent episodedepressed, GAD, ADD  Plan:Discontinue Vraylar, taper off duloxetine by 30 mg per week, then decrease Lamictal to 150 mg at HS (continue on it). ContinueConcerta 54 mg. Next visit in4 weeks.The plan was discussed with patient who had an opportunity to ask questions and these were all answered. I spend25 minutes inphone consultation with the patient.  Visit Diagnosis:    ICD-10-CM   1. Bipolar 2 disorder (HCC)  F31.81   2. GAD (generalized anxiety disorder)  F41.1     Past Psychiatric History: Please see intake H&P.  Past Medical History:  Past Medical History:  Diagnosis Date  . Anxiety   . Depression   . Mania [facility])     Past Surgical History:  Procedure Laterality Date  . WISDOM TOOTH EXTRACTION      Family Psychiatric History: Reviewed.  Family History:  Family History  Problem Relation Age of Onset  . Schizophrenia Paternal Grandmother     Social History:  Social History   Socioeconomic History  . Marital status: Single    Spouse name: Not on file  . Number of children: 0  . Years of education: SOME COLLEGE  .  Highest education level: Associate degree: occupational, Scientist, product/process development, or vocational program  Occupational History  . Not on file  Tobacco Use  . Smoking status: Never Smoker  . Smokeless tobacco: Never Used  Substance and Sexual Activity  . Alcohol use: Never  . Drug use: Never  . Sexual activity: Not  Currently  Other Topics Concern  . Not on file  Social History Narrative  . Not on file   Social Determinants of Health   Financial Resource Strain: Low Risk   . Difficulty of Paying Living Expenses: Not hard at all  Food Insecurity: No Food Insecurity  . Worried About Programme researcher, broadcasting/film/video in the Last Year: Never true  . Ran Out of Food in the Last Year: Never true  Transportation Needs: No Transportation Needs  . Lack of Transportation (Medical): No  . Lack of Transportation (Non-Medical): No  Physical Activity: Inactive  . Days of Exercise per Week: 0 days  . Minutes of Exercise per Session: 0 min  Stress: Stress Concern Present  . Feeling of Stress : Rather much  Social Connections:   . Frequency of Communication with Friends and Family: Not on file  . Frequency of Social Gatherings with Friends and Family: Not on file  . Attends Religious Services: Not on file  . Active Member of Clubs or Organizations: Not on file  . Attends Banker Meetings: Not on file  . Marital Status: Not on file    Allergies: No Known Allergies  Metabolic Disorder Labs: No results found for: HGBA1C, MPG No results found for: PROLACTIN No results found for: CHOL, TRIG, HDL, CHOLHDL, VLDL, LDLCALC No results found for: TSH  Therapeutic Level Labs: No results found for: LITHIUM No results found for: VALPROATE No components found for:  CBMZ  Current Medications: Current Outpatient Medications  Medication Sig Dispense Refill  . DULoxetine (CYMBALTA) 30 MG capsule Take 3 capsules (90 mg total) by mouth at bedtime. 270 capsule 0  . lamoTRIgine (LAMICTAL) 150 MG tablet Take 2 tablets (300 mg total) by mouth at bedtime. 180 tablet 0  . methylphenidate (CONCERTA) 54 MG PO CR tablet Take 1 tablet (54 mg total) by mouth every morning. 30 tablet 0   No current facility-administered medications for this visit.     Psychiatric Specialty Exam: Review of Systems  Psychiatric/Behavioral:  Positive for decreased concentration.  All other systems reviewed and are negative.   There were no vitals taken for this visit.There is no height or weight on file to calculate BMI.  General Appearance: NA  Eye Contact:  NA  Speech:  Clear and Coherent and Normal Rate  Volume:  Normal  Mood:  Mild anxiety.  Affect:  NA  Thought Process:  Goal Directed and Linear  Orientation:  Full (Time, Place, and Person)  Thought Content: Logical   Suicidal Thoughts:  No  Homicidal Thoughts:  No  Memory:  Immediate;   Good Recent;   Good Remote;   Good  Judgement:  Fair  Insight:  Good  Psychomotor Activity:  NA  Concentration:  Concentration: Good  Recall:  Good  Fund of Knowledge: Good  Language: Good  Akathisia:  Negative  Handed:  Right  AIMS (if indicated): not done  Assets:  Communication Skills Desire for Improvement Financial Resources/Insurance Housing Physical Health Social Support  ADL's:  Intact  Cognition: WNL  Sleep:  Good    Assessment and Plan: 33yo single malewitha long hx of anxiety, depression, problems with focusing, concentration, task  completion. He has been treated for depression/anxiety and ADD in the past(Ritalin). He has a hx of having SI and admits to one suicidal attempt by cutting in August 2019. He admits to a hx of brief 2-3 day long episodes on increased energy, irritability, racing thoughts, hyperactivity, decreased need for sleep. He also remembers having at times paranoid fears while in a hypomanic episode. 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 His depressive episodes are typically much longer and caused much more occupational and social dysfunction.We planned to changeAbilify to Latudafor bipolar depression, continue Cymbalta for anxiety and increase dose of Lamictal to 200 mg.Patient's insurance Actor) did not approve Latuda so we started low 50 mg dose of Seroquel instead. Patient however could  nottolerate it - sedated, tired the next day.We therefore started Rexulti for augmentation of duloxetine which he tolerates wellbut has been gradually gaining weight!We also started methylphenidate xr initially 20 mg daily, then changed toConcertanow at 54 mg strength. Adam Larsen reports much improvement in his focusing ability. Adam Larsen started to feel very paranoid and depressed with rapid mood swings in late November - his mother called the office and we have increased dose of Geodon to 60 mg at HS and then to 80 mg. He however could not tolerate it - reported feeling dizzy and having "chest tightness". We stopped ziprasidone and started 1.5 mg of  cariprazine which he tolerates well and noticed his mood to beginning to stabilize (paranoia resolved). Even though it has been approved by his insurance price is prohibitive so we have been giving him samples. His mood has stabilized but he now would like to get off as many medications as possible while keeping Concerta. I managed to convince Adam Larsen to stay on Lamictal (agreed to that but asked for a lower dose).  IE:[id number] 2 disordermost recent episodedepressed, GAD, ADD  Plan:Discontinue Vraylar, taper off duloxetine by 30 mg per week, then decrease Lamictal to 150 mg at HS (continue on it). ContinueConcerta 54 mg. Next visit in4 weeks.The plan was discussed with patient who had an opportunity to ask questions and these were all answered. I spend25 minutes inphone consultation with the patient.    Stephanie Acre, MD 11/11/2019, 2:41 PM

## 2019-12-02 ENCOUNTER — Other Ambulatory Visit (HOSPITAL_COMMUNITY): Payer: Self-pay | Admitting: Psychiatry

## 2019-12-02 ENCOUNTER — Telehealth (HOSPITAL_COMMUNITY): Payer: Self-pay | Admitting: *Deleted

## 2019-12-02 MED ORDER — METHYLPHENIDATE HCL ER (OSM) 54 MG PO TBCR: 54.0000 mg | EXTENDED_RELEASE_TABLET | Freq: Every morning | ORAL | 0 refills | Status: DC

## 2019-12-02 NOTE — Telephone Encounter (Signed)
Done

## 2019-12-02 NOTE — Telephone Encounter (Signed)
Pt called requesting refill on the Concerta 54 mg po CR. Last filled 11/01/19. Pt would like this sent to the [facility] at [facility]. Please review.

## 2019-12-14 ENCOUNTER — Other Ambulatory Visit: Payer: Self-pay

## 2019-12-14 ENCOUNTER — Ambulatory Visit (INDEPENDENT_AMBULATORY_CARE_PROVIDER_SITE_OTHER): Payer: 59 | Admitting: Psychiatry

## 2019-12-14 DIAGNOSIS — F900 Attention-deficit hyperactivity disorder, predominantly inattentive type: Secondary | ICD-10-CM | POA: Diagnosis not present

## 2019-12-14 DIAGNOSIS — F411 Generalized anxiety disorder: Secondary | ICD-10-CM

## 2019-12-14 DIAGNOSIS — F3181 Bipolar II disorder: Secondary | ICD-10-CM

## 2019-12-14 MED ORDER — METHYLPHENIDATE HCL ER (OSM) 54 MG PO TBCR: 54.0000 mg | EXTENDED_RELEASE_TABLET | Freq: Every morning | ORAL | 0 refills | Status: DC

## 2019-12-14 MED ORDER — LAMOTRIGINE 150 MG PO TABS: 150.0000 mg | ORAL_TABLET | Freq: Every day | ORAL | 0 refills | Status: DC

## 2019-12-14 NOTE — Progress Notes (Signed)
BH MD/PA/NP OP Progress Note  12/14/2019 2:11 PM Adam Larsen.  MRN:  [id number] Interview was conducted by phone and I verified that I was speaking with the correct person using two identifiers. I discussed the limitations of evaluation and management by telemedicine and  the availability of in person appointments. Patient expressed understanding and agreed to proceed.  Chief Complaint: "I am much more clearheaded now".  HPI: 33yo single malewitha long hx of anxiety, depression, problems with focusing, concentration, task completion. He has been treated for depression/anxiety and ADD in the past(Ritalin). He has a hx of having SI and admits to one suicidal attempt by cutting in August 2019. He admits to a hx of brief 2-3 day long episodes on increased energy, irritability, racing thoughts, hyperactivity, decreased need for sleep. He also remembers having at times paranoid fears while in a hypomanic episode. 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 His depressive episodes are typically much longer and caused much more occupational and social dysfunction.We planned to changeAbilify to Latudafor bipolar depression, continue Cymbalta for anxiety and increase dose of Lamictal to 200 mg.Patient's insurance Psychologist, occupational) did not approve Latuda so we started low 50 mg dose of Seroquel instead. Patient however could nottolerate it - sedated, tired the next day.We therefore started Rexulti for augmentation of duloxetine which he tolerates wellbut has been gradually gaining weight!We also started methylphenidate xr initially 20 mg daily, then changed toConcertanow at 54 mg strength. Adam Larsen reports much improvement in his focusing ability. Richardstarted to feelvery paranoid and depressedwith rapid mood swings in late November- his mother called the office and we have increased dose of Geodon to 60 mg at [facility] then to 80 mg. He however could not tolerate it -  reported feeling dizzy and having "chest tightness". We stopped ziprasidone and started 1.5 mg of cariprazine which he tolerates well and noticed his mood to beginning to stabilize (he admits to having mild paranoia but is able to use cognitive skills - knows that fears are unjustified). Even though it has been approved by his insurance price is prohibitive so we have been giving him samples. His mood has stabilized but he wanted to get off as many medications (cariprazoine, duloxetine) as possible while keeping Concerta. I managed to convince Adam Larsen to stay on Lamictal (agreed to that but asked for a lower dose). He feels that Concerta does not work as effectively as in the beginning. He plans to start looking for a job soon.  Visit Diagnosis:    ICD-10-CM   1. Bipolar 2 disorder (HCC)  F31.81   2. ADHD, predominantly inattentive type  F90.0   3. GAD (generalized anxiety disorder)  F41.1     Past Psychiatric History: Please see intake H&P.  Past Medical History:  Past Medical History:  Diagnosis Date  . Anxiety   . Depression   . Mania [facility])     Past Surgical History:  Procedure Laterality Date  . WISDOM TOOTH EXTRACTION      Family Psychiatric History: Reviewed.  Family History:  Family History  Problem Relation Age of Onset  . Schizophrenia Paternal Grandmother     Social History:  Social History   Socioeconomic History  . Marital status: Single    Spouse name: Not on file  . Number of children: 0  . Years of education: SOME COLLEGE  . Highest education level: Associate degree: occupational, Scientist, product/process development, or vocational program  Occupational History  . Not on  file  Tobacco Use  . Smoking status: Never Smoker  . Smokeless tobacco: Never Used  Substance and Sexual Activity  . Alcohol use: Never  . Drug use: Never  . Sexual activity: Not Currently  Other Topics Concern  . Not on file  Social History Narrative  . Not on file   Social Determinants of Health    Financial Resource Strain: Low Risk   . Difficulty of Paying Living Expenses: Not hard at all  Food Insecurity: No Food Insecurity  . Worried About Charity fundraiser in the Last Year: Never true  . Ran Out of Food in the Last Year: Never true  Transportation Needs: No Transportation Needs  . Lack of Transportation (Medical): No  . Lack of Transportation (Non-Medical): No  Physical Activity: Inactive  . Days of Exercise per Week: 0 days  . Minutes of Exercise per Session: 0 min  Stress: Stress Concern Present  . Feeling of Stress : Rather much  Social Connections:   . Frequency of Communication with Friends and Family: Not on file  . Frequency of Social Gatherings with Friends and Family: Not on file  . Attends Religious Services: Not on file  . Active Member of Clubs or Organizations: Not on file  . Attends Archivist Meetings: Not on file  . Marital Status: Not on file    Allergies: No Known Allergies  Metabolic Disorder Labs: No results found for: HGBA1C, MPG No results found for: PROLACTIN No results found for: CHOL, TRIG, HDL, CHOLHDL, VLDL, LDLCALC No results found for: TSH  Therapeutic Level Labs: No results found for: LITHIUM No results found for: VALPROATE No components found for:  CBMZ  Current Medications: Current Outpatient Medications  Medication Sig Dispense Refill  . lamoTRIgine (LAMICTAL) 150 MG tablet Take 1 tablet (150 mg total) by mouth at bedtime. 90 tablet 0  . [START ON 01/01/2020] methylphenidate (CONCERTA) 54 MG PO CR tablet Take 1 tablet (54 mg total) by mouth every morning. 30 tablet 0   No current facility-administered medications for this visit.     Psychiatric Specialty Exam: Review of Systems  Psychiatric/Behavioral: Positive for decreased concentration. The patient is nervous/anxious.   All other systems reviewed and are negative.   There were no vitals taken for this visit.There is no height or weight on file to calculate  BMI.  General Appearance: NA  Eye Contact:  NA  Speech:  Clear and Coherent and Normal Rate  Volume:  Normal  Mood:  Anxious  Affect:  NA  Thought Process:  Goal Directed and Linear  Orientation:  Full (Time, Place, and Person)  Thought Content: Mild paranoia.   Suicidal Thoughts:  No  Homicidal Thoughts:  No  Memory:  Immediate;   Good Recent;   Good Remote;   Good  Judgement:  Good  Insight:  Good  Psychomotor Activity:  NA  Concentration:  Concentration: Fair  Recall:  Good  Fund of Knowledge: Good  Language: Good  Akathisia:  Negative  Handed:  Right  AIMS (if indicated): not done  Assets:  Communication Skills Desire for [place] Talents/Skills  ADL's:  Intact  Cognition: WNL  Sleep:  Good   Assessment and Plan: 33yo single malewitha long hx of anxiety, depression, problems with focusing, concentration, task completion. He has been treated for depression/anxiety and ADD in the past(Ritalin). He has a hx of having SI and admits to one suicidal attempt by cutting in August 2019. He  admits to a hx of brief 2-3 day long episodes on increased energy, irritability, racing thoughts, hyperactivity, decreased need for sleep. He also remembers having at times paranoid fears while in a hypomanic episode. 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 His depressive episodes are typically much longer and caused much more occupational and social dysfunction.We planned to changeAbilify to Latudafor bipolar depression, continue Cymbalta for anxiety and increase dose of Lamictal to 200 mg.Patient's insurance Psychologist, occupational) did not approve Latuda so we started low 50 mg dose of Seroquel instead. Patient however could nottolerate it - sedated, tired the next day.We therefore started Rexulti for augmentation of duloxetine which he tolerates wellbut has been gradually gaining weight!We also started methylphenidate  xr initially 20 mg daily, then changed toConcertanow at 54 mg strength. Deandrea reports much improvement in his focusing ability. Richardstarted to feelvery paranoid and depressedwith rapid mood swings in late November- his mother called the office and we have increased dose of Geodon to 60 mg at [facility] then to 80 mg. He however could not tolerate it - reported feeling dizzy and having "chest tightness". We stopped ziprasidone and started 1.5 mg of cariprazine which he tolerates well and noticed his mood to beginning to stabilize (he admits to having mild paranoia but is able to use cognitive skills - knows that fears are unjustified). Even though it has been approved by his insurance price is prohibitive so we have been giving him samples. His mood has stabilized but he wanted to get off as many medications (cariprazoine, duloxetine) as possible while keeping Concerta. I managed to convince Mirko to stay on Lamictal (agreed to that but asked for a lower dose). He feels that Concerta does not work as effectively as in the beginning. He plans to start looking for a job soon.  DV:VOHYWVP 2 disordermost recent episodedepressed, GAD, ADD  Plan:Continue Lamictal to 150 mg at HS and Concerta 54 mg. Next visit in4weeks.The plan was discussed with patient who had an opportunity to ask questions and these were all answered. I spend25 minutes inphone consultation with the patient.    Magdalene Patricia, MD 12/14/2019, 2:11 PM

## 2020-01-06 NOTE — Telephone Encounter (Signed)
Opened in error

## 2020-01-12 ENCOUNTER — Ambulatory Visit (INDEPENDENT_AMBULATORY_CARE_PROVIDER_SITE_OTHER): Payer: 59 | Admitting: Psychiatry

## 2020-01-12 ENCOUNTER — Other Ambulatory Visit: Payer: Self-pay

## 2020-01-12 DIAGNOSIS — F411 Generalized anxiety disorder: Secondary | ICD-10-CM

## 2020-01-12 DIAGNOSIS — F900 Attention-deficit hyperactivity disorder, predominantly inattentive type: Secondary | ICD-10-CM | POA: Diagnosis not present

## 2020-01-12 DIAGNOSIS — F3181 Bipolar II disorder: Secondary | ICD-10-CM

## 2020-01-12 MED ORDER — METHYLPHENIDATE HCL ER (OSM) 54 MG PO TBCR: 54.0000 mg | EXTENDED_RELEASE_TABLET | Freq: Every morning | ORAL | 0 refills | Status: DC

## 2020-01-12 NOTE — Progress Notes (Signed)
BH MD/PA/NP OP Progress Note  01/12/2020 2:39 PM Chan Roque Cash.  MRN:  [id number] Interview was conducted by phone and I verified that I was speaking with the correct person using two identifiers. I discussed the limitations of evaluation and management by telemedicine and  the availability of in person appointments. Patient expressed understanding and agreed to proceed.  Chief Complaint: "I am doing well".  HPI: 34yo single malewitha long hx of anxiety, depression, problems with focusing, concentration, task completion. He has been treated for depression/anxiety and ADD in the past(Ritalin). He has a hx of having SI and admits to one suicidal attempt by cutting in August 2019. He admits to a hx of brief 2-3 day long episodes on increased energy, irritability, racing thoughts, hyperactivity, decreased need for sleep. He also remembers having at times paranoid fears while in a hypomanic episode. 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 His depressive episodes are typically much longer and caused much more occupational and social dysfunction.We planned to changeAbilify to Latudafor bipolar depression, continue Cymbalta for anxiety and increase dose of Lamictal to 200 mg.Patient's insurance Psychologist, occupational) did not approve Latuda so we started low 50 mg dose of Seroquel instead. Patient however could nottolerate it - sedated, tired the next day.We therefore started Rexulti for augmentation of duloxetine which he tolerates wellbut has been gradually gaining weight!We also started methylphenidate xr initially 20 mg daily, then changed toConcertanow at 54 mg strength. Doss reports much improvement in his focusing ability. Richardstarted to feelvery paranoid and depressedwith rapid mood swings in late November- his mother called the office and we have increased dose of Geodon to 60 mg at [facility] then to 80 mg. He however could not tolerate it - reported  feeling dizzy and having "chest tightness". We stopped ziprasidone and started 1.5 mg of cariprazine which he tolerates well and noticed his mood to beginning to stabilize (he admits to having mild paranoia but is able to use cognitive skills - knows that fears are unjustified). Even though it has been approved by his insurance price is prohibitive so we have been giving him samples.His mood has stabilized but he wanted to get off as many medications (cariprazoine, duloxetine) as possible while keeping Concerta. I managed to convince Isiac to stay on Lamictal (agreed to that but asked for a lower dose). Sleep and appetite are good. He plans to start looking for a job soon.  Visit Diagnosis:    ICD-10-CM   1. GAD (generalized anxiety disorder)  F41.1   2. Bipolar 2 disorder (HCC)  F31.81   3. ADHD, predominantly inattentive type  F90.0     Past Psychiatric History: Please see intake H&P.  Past Medical History:  Past Medical History:  Diagnosis Date  . Anxiety   . Depression   . Mania [facility])     Past Surgical History:  Procedure Laterality Date  . WISDOM TOOTH EXTRACTION      Family Psychiatric History: Reviewed.  Family History:  Family History  Problem Relation Age of Onset  . Schizophrenia Paternal Grandmother     Social History:  Social History   Socioeconomic History  . Marital status: Single    Spouse name: Not on file  . Number of children: 0  . Years of education: SOME COLLEGE  . Highest education level: Associate degree: occupational, Scientist, product/process development, or vocational program  Occupational History  . Not on file  Tobacco Use  . Smoking status: Never Smoker  .  Smokeless tobacco: Never Used  Substance and Sexual Activity  . Alcohol use: Never  . Drug use: Never  . Sexual activity: Not Currently  Other Topics Concern  . Not on file  Social History Narrative  . Not on file   Social Determinants of Health   Financial Resource Strain:   . Difficulty of Paying  Living Expenses:   Food Insecurity:   . Worried About Programme researcher, broadcasting/film/video in the Last Year:   . Barista in the Last Year:   Transportation Needs:   . Freight forwarder (Medical):   Marland Kitchen Lack of Transportation (Non-Medical):   Physical Activity:   . Days of Exercise per Week:   . Minutes of Exercise per Session:   Stress:   . Feeling of Stress :   Social Connections:   . Frequency of Communication with Friends and Family:   . Frequency of Social Gatherings with Friends and Family:   . Attends Religious Services:   . Active Member of Clubs or Organizations:   . Attends Banker Meetings:   Marland Kitchen Marital Status:     Allergies: No Known Allergies  Metabolic Disorder Labs: No results found for: HGBA1C, MPG No results found for: PROLACTIN No results found for: CHOL, TRIG, HDL, CHOLHDL, VLDL, LDLCALC No results found for: TSH  Therapeutic Level Labs: No results found for: LITHIUM No results found for: VALPROATE No components found for:  CBMZ  Current Medications: Current Outpatient Medications  Medication Sig Dispense Refill  . lamoTRIgine (LAMICTAL) 150 MG tablet Take 1 tablet (150 mg total) by mouth at bedtime. 90 tablet 0  . [START ON 01/31/2020] methylphenidate (CONCERTA) 54 MG PO CR tablet Take 1 tablet (54 mg total) by mouth every morning. 30 tablet 0   No current facility-administered medications for this visit.    Psychiatric Specialty Exam: Review of Systems  All other systems reviewed and are negative.   There were no vitals taken for this visit.There is no height or weight on file to calculate BMI.  General Appearance: NA  Eye Contact:  NA  Speech:  Clear and Coherent and Normal Rate  Volume:  Normal  Mood:  Euthymic  Affect:  NA  Thought Process:  Goal Directed and Linear  Orientation:  Full (Time, Place, and Person)  Thought Content: Logical   Suicidal Thoughts:  No  Homicidal Thoughts:  No  Memory:  Immediate;   Good Recent;    Good Remote;   Good  Judgement:  Good  Insight:  Good  Psychomotor Activity:  NA  Concentration:  Concentration: Fair  Recall:  Good  Fund of Knowledge: Good  Language: Good  Akathisia:  Negative  Handed:  Right  AIMS (if indicated): not done  Assets:  Communication Skills Desire for Improvement Housing Physical Health Social Support Talents/Skills  ADL's:  Intact  Cognition: WNL  Sleep:  Good    Assessment and Plan: 33yo single malewitha long hx of anxiety, depression, problems with focusing, concentration, task completion. He has been treated for depression/anxiety and ADD in the past(Ritalin). He has a hx of having SI and admits to one suicidal attempt by cutting in August 2019. He admits to a hx of brief 2-3 day long episodes on increased energy, irritability, racing thoughts, hyperactivity, decreased need for sleep. He also remembers having at times paranoid fears while in a hypomanic episode. 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 His depressive  episodes are typically much longer and caused much more occupational and social dysfunction.We planned to changeAbilify to Latudafor bipolar depression, continue Cymbalta for anxiety and increase dose of Lamictal to 200 mg.Patient's insurance Actor) did not approve Latuda so we started low 50 mg dose of Seroquel instead. Patient however could nottolerate it - sedated, tired the next day.We therefore started Rexulti for augmentation of duloxetine which he tolerates wellbut has been gradually gaining weight!We also started methylphenidate xr initially 20 mg daily, then changed toConcertanow at 54 mg strength. Priyansh reports much improvement in his focusing ability. Richardstarted to feelvery paranoid and depressedwith rapid mood swings in late November- his mother called the office and we have increased dose of Geodon to 60 mg at [facility] then to 80 mg. He however could not tolerate it -  reported feeling dizzy and having "chest tightness". We stopped ziprasidone and started 1.5 mg of cariprazine which he tolerates well and noticed his mood to beginning to stabilize (he admits to having mild paranoia but is able to use cognitive skills - knows that fears are unjustified). Even though it has been approved by his insurance price is prohibitive so we have been giving him samples.His mood has stabilized but he wanted to get off as many medications (cariprazoine, duloxetine) as possible while keeping Concerta. I managed to convince Logyn to stay on Lamictal (agreed to that but asked for a lower dose). Sleep and appetite are good. He plans to start looking for a job soon.  MA:YOKHTXH 2 disordermost recent episodedepressed, GAD, ADD  Plan:Continue Lamictal to 150 mg at HS and Concerta 54 mg.Next visit in4weeks.The plan was discussed with patient who had an opportunity to ask questions and these were all answered. I spend20 minutes inphone consultation with the patient.    Stephanie Acre, MD 01/12/2020, 2:39 PM

## 2020-02-16 ENCOUNTER — Other Ambulatory Visit: Payer: Self-pay

## 2020-02-16 ENCOUNTER — Telehealth (INDEPENDENT_AMBULATORY_CARE_PROVIDER_SITE_OTHER): Payer: 59 | Admitting: Psychiatry

## 2020-02-16 DIAGNOSIS — F3181 Bipolar II disorder: Secondary | ICD-10-CM

## 2020-02-16 DIAGNOSIS — F900 Attention-deficit hyperactivity disorder, predominantly inattentive type: Secondary | ICD-10-CM

## 2020-02-16 DIAGNOSIS — F411 Generalized anxiety disorder: Secondary | ICD-10-CM | POA: Diagnosis not present

## 2020-02-16 MED ORDER — METHYLPHENIDATE HCL ER (OSM) 54 MG PO TBCR: 54.0000 mg | EXTENDED_RELEASE_TABLET | Freq: Every day | ORAL | 0 refills | Status: DC

## 2020-02-16 MED ORDER — LAMOTRIGINE 150 MG PO TABS: 150.0000 mg | ORAL_TABLET | Freq: Every day | ORAL | 0 refills | Status: DC

## 2020-02-16 NOTE — Progress Notes (Signed)
BH MD/PA/NP OP Progress Note  02/16/2020 2:53 PM Adam Larsen.  MRN:  [id number] Interview was conducted by phone and I verified that I was speaking with the correct person using two identifiers. I discussed the limitations of evaluation and management by telemedicine and  the availability of in person appointments. Patient expressed understanding and agreed to proceed.  Chief Complaint: "Everything is good".  HPI: 34yo single malewitha long hx of anxiety, depression, problems with focusing, concentration, task completion. He has been treated for depression/anxiety and ADD in the past(Ritalin). He has a hx of having SI and admits to one suicidal attempt by cutting in August 2019. He admits to a hx of brief 2-3 day long episodes on increased energy, irritability, racing thoughts, hyperactivity, decreased need for sleep. He also remembers having at times paranoid fears while in a hypomanic episode. 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 His depressive episodes are typically much longer and caused much more occupational and social dysfunction.We planned to changeAbilify to Latudafor bipolar depression, continue Cymbalta for anxiety and increase dose of Lamictal to 200 mg.Patient's insurance Psychologist, occupational) did not approve Latuda so we started low 50 mg dose of Seroquel instead. Patient however could nottolerate it - sedated, tired the next day.We therefore started Rexulti for augmentation of duloxetine which he tolerates wellbut has been gradually gaining weight!We also started methylphenidate xr initially 20 mg daily, then changed toConcertanow at 54 mg strength. Josimar reports much improvement in his focusing ability. Richardstarted to feelvery paranoid and depressedwith rapid mood swings in late November- his mother called the office and we have increased dose of Geodon to [facility] mg at [facility] then to 80 mg. He however could not tolerate it - reported  feeling dizzy and having "chest tightness". We stopped ziprasidone and started 1.5 mg of cariprazine which he tolerates well and noticed his mood to beginning to stabilize (he admits to having mildparanoiabut is able to use cognitive skills - knows that fears are unjustified). Even though it has been approved by his insurance price is prohibitive so we have been giving him samples.His mood has stabilized but he wantedto get off as many medications (cariprazoine, duloxetine)as possible while keeping Concerta.He eventually decided to stay on Lamictal.  Sleep and appetite are good and there are no mood fluctuations or anxiety reported.   Visit Diagnosis:    ICD-10-CM   1. Bipolar 2 disorder (HCC)  F31.81   2. GAD (generalized anxiety disorder)  F41.1   3. ADHD, predominantly inattentive type  F90.0     Past Psychiatric History: Please see intake H&P.  Past Medical History:  Past Medical History:  Diagnosis Date  . Anxiety   . Depression   . [facility])     Past Surgical History:  Procedure Laterality Date  . WISDOM TOOTH EXTRACTION      Family Psychiatric History: Reviewed.  Family History:  Family History  Problem Relation Age of Onset  . Schizophrenia Paternal Grandmother     Social History:  Social History   Socioeconomic History  . Marital status: Single    Spouse name: Not on file  . Number of children: 0  . Years of education: SOME COLLEGE  . Highest education level: Associate degree: occupational, Scientist, product/process development, or vocational program  Occupational History  . Not on file  Tobacco Use  . Smoking status: Never Smoker  . Smokeless tobacco: Never Used  Substance and Sexual Activity  . Alcohol use: Never  .  Drug use: Never  . Sexual activity: Not Currently  Other Topics Concern  . Not on file  Social History Narrative  . Not on file   Social Determinants of Health   Financial Resource Strain:   . Difficulty of Paying Living Expenses:   Food Insecurity:    . Worried About Programme researcher, broadcasting/film/video in the Last Year:   . Barista in the Last Year:   Transportation Needs:   . Freight forwarder (Medical):   Marland Kitchen Lack of Transportation (Non-Medical):   Physical Activity:   . Days of Exercise per Week:   . Minutes of Exercise per Session:   Stress:   . Feeling of Stress :   Social Connections:   . Frequency of Communication with Friends and Family:   . Frequency of Social Gatherings with Friends and Family:   . Attends Religious Services:   . Active Member of Clubs or Organizations:   . Attends Banker Meetings:   Marland Kitchen Marital Status:     Allergies: No Known Allergies  Metabolic Disorder Labs: No results found for: HGBA1C, MPG No results found for: PROLACTIN No results found for: CHOL, TRIG, HDL, CHOLHDL, VLDL, LDLCALC No results found for: TSH  Therapeutic Level Labs: No results found for: LITHIUM No results found for: VALPROATE No components found for:  CBMZ  Current Medications: Current Outpatient Medications  Medication Sig Dispense Refill  . [START ON 03/13/2020] lamoTRIgine (LAMICTAL) 150 MG tablet Take 1 tablet (150 mg total) by mouth at bedtime. 90 tablet 0  . methylphenidate (CONCERTA) 54 MG PO CR tablet Take 1 tablet (54 mg total) by mouth every morning. 30 tablet 0  . [START ON 03/01/2020] methylphenidate (CONCERTA) 54 MG PO CR tablet Take 1 tablet (54 mg total) by mouth daily before breakfast. 30 tablet 0  . [START ON 04/01/2020] methylphenidate (CONCERTA) 54 MG PO CR tablet Take 1 tablet (54 mg total) by mouth daily before breakfast. 30 tablet 0  . [START ON 05/01/2020] methylphenidate (CONCERTA) 54 MG PO CR tablet Take 1 tablet (54 mg total) by mouth daily before breakfast. 30 tablet 0   No current facility-administered medications for this visit.     Psychiatric Specialty Exam: Review of Systems  All other systems reviewed and are negative.   There were no vitals taken for this visit.There is no  height or weight on file to calculate BMI.  General Appearance: NA  Eye Contact:  NA  Speech:  Clear and Coherent and Normal Rate  Volume:  Normal  Mood:  Euthymic  Affect:  NA  Thought Process:  Goal Directed and Linear  Orientation:  Full (Time, Place, and Person)  Thought Content: Logical   Suicidal Thoughts:  No  Homicidal Thoughts:  No  Memory:  Immediate;   Good Recent;   Good Remote;   Good  Judgement:  Good  Insight:  Good  Psychomotor Activity:  NA  Concentration:  Concentration: Good  Recall:  Good  Fund of Knowledge: Good  Language: Good  Akathisia:  Negative  Handed:  Right  AIMS (if indicated): not done  Assets:  Communication Skills Desire for Improvement Housing Social Support  ADL's:  Intact  Cognition: WNL  Sleep:  Good    Assessment and Plan: 33yo single malewitha long hx of anxiety, depression, problems with focusing, concentration, task completion. He has been treated for depression/anxiety and ADD in the past(Ritalin). He has a hx of having SI and admits to one  suicidal attempt by cutting in August 2019. He admits to a hx of brief 2-3 day long episodes on increased energy, irritability, racing thoughts, hyperactivity, decreased need for sleep. He also remembers having at times paranoid fears while in a hypomanic episode. 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 His depressive episodes are typically much longer and caused much more occupational and social dysfunction.We planned to changeAbilify to Latudafor bipolar depression, continue Cymbalta for anxiety and increase dose of Lamictal to 200 mg.Patient's insurance [employer]) did not approve Latuda so we started low 50 mg dose of Seroquel instead. Patient however could nottolerate it - sedated, tired the next day.We therefore started Rexulti for augmentation of duloxetine which he tolerates wellbut has been gradually gaining weight!We also started methylphenidate  xr initially 20 mg daily, then changed toConcertanow at 54 mg strength. Harbert reports much improvement in his focusing ability. Richardstarted to feelvery paranoid and depressedwith rapid mood swings in late November- his mother called the office and we have increased dose of Geodon to 60 mg at [facility] then to 80 mg. He however could not tolerate it - reported feeling dizzy and having "chest tightness". We stopped ziprasidone and started 1.5 mg of cariprazine which he tolerates well and noticed his mood to beginning to stabilize (he admits to having mildparanoiabut is able to use cognitive skills - knows that fears are unjustified). Even though it has been approved by his insurance price is prohibitive so we have been giving him samples.His mood has stabilized but he wantedto get off as many medications (cariprazoine, duloxetine)as possible while keeping Concerta.He eventually decided to stay on Lamictal.  Sleep and appetite are good and there are no mood fluctuations or anxiety reported.  TO:IZTIWPY 2 disorder, in remission, GAD, ADD  Plan:ContinueLamictal to 099 mg at [facility] 54 mg.Next visit in2 months.The plan was discussed with patient who had an opportunity to ask questions and these were all answered. I spend15 minutes inphone consultation with the patient    Stephanie Acre, MD 02/16/2020, 2:53 PM

## 2020-04-13 ENCOUNTER — Other Ambulatory Visit: Payer: Self-pay

## 2020-04-13 ENCOUNTER — Telehealth (INDEPENDENT_AMBULATORY_CARE_PROVIDER_SITE_OTHER): Payer: 59 | Admitting: Psychiatry

## 2020-04-13 DIAGNOSIS — F900 Attention-deficit hyperactivity disorder, predominantly inattentive type: Secondary | ICD-10-CM

## 2020-04-13 DIAGNOSIS — F3181 Bipolar II disorder: Secondary | ICD-10-CM

## 2020-04-13 DIAGNOSIS — F411 Generalized anxiety disorder: Secondary | ICD-10-CM

## 2020-04-13 MED ORDER — ALPRAZOLAM 0.5 MG PO TABS: 0.5000 mg | ORAL_TABLET | Freq: Two times a day (BID) | ORAL | 0 refills | Status: DC | PRN

## 2020-04-13 MED ORDER — METHYLPHENIDATE HCL ER (LA) 40 MG PO CP24: 40.0000 mg | ORAL_CAPSULE | ORAL | 0 refills | Status: DC

## 2020-04-13 MED ORDER — BREXPIPRAZOLE 1 MG PO TABS: ORAL_TABLET | ORAL | 0 refills | Status: DC

## 2020-04-13 MED ORDER — METFORMIN HCL 500 MG PO TABS: 500.0000 mg | ORAL_TABLET | Freq: Every day | ORAL | 2 refills | Status: DC

## 2020-04-13 NOTE — Progress Notes (Signed)
BH MD/PA/NP OP Progress Note  04/13/2020 4:05 PM Adam Larsen.  MRN:  [id number] Interview was conducted by phone and I verified that I was speaking with the correct person using two identifiers. I discussed the limitations of evaluation and management by telemedicine and  the availability of in person appointments. Patient expressed understanding and agreed to proceed. Patient location - home; physician - home office.  Chief Complaint: Paranoia, depression/anxiety.  HPI: 33yo single malewitha long hx of anxiety, depression, problems with focusing, concentration, task completion. He has been treated for depression/anxiety and ADD in the past(Ritalin). He has a hx of having SI and admits to one suicidal attempt by cutting in August 2019. He admits to a hx of brief 2-3 day long episodes on increased energy, irritability, racing thoughts, hyperactivity, decreased need for sleep. He also remembers having at times paranoid fears while in a hypomanic episode. 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 His depressive episodes are typically much longer and caused much more occupational and social dysfunction.We changedAbilify to Latudafor bipolar depression but he did not respond to this medication.  We the tried  50 mg of quetiapine but he could nottolerate it - sedated, tired the next day.We then started Rexulti for augmentation of duloxetine which he tolerated well and reported clear improvement in mood but has been gradually gaining weight!We also started methylphenidate xr initially 20 mg daily, then changed toConcertanow at 54 mg strength. Adam Larsen reports much improvement in his focusing ability but feels "like crashing in early afternoon). Richardstarted to feelvery paranoid and depressedwith rapid mood swings in late November- his mother called the office and we have increased dose of Geodon to 60 mg at [facility] then to 80 mg. He however could not  tolerate it - reported feeling dizzy and having "chest tightness". We stopped ziprasidone and started 1.5 mg of cariprazine which he tolerated well and noticed his mood to beginning to stabilize (he admits to having mildparanoiabut is able to use cognitive skills - knows that fears are unjustified). Even though it has been approved by his insurance price is prohibitive so we have been giving him samples.His mood has stabilized but he wantedto get off as many medications (cariprazoine, duloxetine)as possible while keeping Concerta. He eventually decided to stay on Lamictal. About a month ago he noticed decline in mood, increased anxiety/paranoia. His father started giving him 2 mg of Rexulti which he had from before.   Visit Diagnosis:    ICD-10-CM   1. Bipolar 2 disorder (HCC)  F31.81   2. ADHD, predominantly inattentive type  F90.0   3. GAD (generalized anxiety disorder)  F41.1     Past Psychiatric History: Please see intake H&P.  Past Medical History:  Past Medical History:  Diagnosis Date  . Anxiety   . Depression   . [facility])     Past Surgical History:  Procedure Laterality Date  . WISDOM TOOTH EXTRACTION      Family Psychiatric History: Reviewed.  Family History:  Family History  Problem Relation Age of Onset  . Schizophrenia Paternal Grandmother     Social History:  Social History   Socioeconomic History  . Marital status: Single    Spouse name: Not on file  . Number of children: 0  . Years of education: SOME COLLEGE  . Highest education level: Associate degree: occupational, Scientist, product/process development, or vocational program  Occupational History  . Not on file  Tobacco Use  . Smoking  status: Never Smoker  . Smokeless tobacco: Never Used  Vaping Use  . Vaping Use: Every day  . Substances: Nicotine, Flavoring, Nicotine-salt  Substance and Sexual Activity  . Alcohol use: Never  . Drug use: Never  . Sexual activity: Not Currently  Other Topics Concern  . Not on file   Social History Narrative  . Not on file   Social Determinants of Health   Financial Resource Strain:   . Difficulty of Paying Living Expenses:   Food Insecurity:   . Worried About Programme researcher, broadcasting/film/video in the Last Year:   . Barista in the Last Year:   Transportation Needs:   . Freight forwarder (Medical):   Marland Kitchen Lack of Transportation (Non-Medical):   Physical Activity:   . Days of Exercise per Week:   . Minutes of Exercise per Session:   Stress:   . Feeling of Stress :   Social Connections:   . Frequency of Communication with Friends and Family:   . Frequency of Social Gatherings with Friends and Family:   . Attends Religious Services:   . Active Member of Clubs or Organizations:   . Attends Banker Meetings:   Marland Kitchen Marital Status:     Allergies: No Known Allergies  Metabolic Disorder Labs: No results found for: HGBA1C, MPG No results found for: PROLACTIN No results found for: CHOL, TRIG, HDL, CHOLHDL, VLDL, LDLCALC No results found for: TSH  Therapeutic Level Labs: No results found for: LITHIUM No results found for: VALPROATE No components found for:  CBMZ  Current Medications: Current Outpatient Medications  Medication Sig Dispense Refill  . brexpiprazole (REXULTI) 1 MG TABS tablet Take 1 tablet (1 mg total) by mouth daily for 7 days, THEN 2 tablets (2 mg total) daily. 67 tablet 0  . lamoTRIgine (LAMICTAL) 150 MG tablet Take 1 tablet (150 mg total) by mouth at bedtime. 90 tablet 0  . metFORMIN (GLUCOPHAGE) 500 MG tablet Take 1 tablet (500 mg total) by mouth daily with breakfast. 30 tablet 2  . methylphenidate (RITALIN LA) 40 MG 24 hr capsule Take 1 capsule (40 mg total) by mouth every morning. 30 capsule 0   No current facility-administered medications for this visit.     Psychiatric Specialty Exam: Review of Systems  Psychiatric/Behavioral: Positive for decreased concentration. The patient is nervous/anxious.   All other systems reviewed  and are negative.   There were no vitals taken for this visit.There is no height or weight on file to calculate BMI.  General Appearance: NA  Eye Contact:  NA  Speech:  Clear and Coherent and Normal Rate  Volume:  Normal  Mood:  Anxious and Depressed  Affect:  NA  Thought Process:  Goal Directed  Orientation:  Full (Time, Place, and Person)  Thought Content: Paranoid Ideation   Suicidal Thoughts:  No  Homicidal Thoughts:  No  Memory:  Immediate;   Good Recent;   Good Remote;   Good  Judgement:  Fair  Insight:  Fair  Psychomotor Activity:  NA  Concentration:  Concentration: Good  Recall:  Good  Fund of Knowledge: Good  Language: Good  Akathisia:  Negative  Handed:  Right  AIMS (if indicated): not done  Assets:  Communication Skills Desire for Improvement Financial Resources/Insurance Housing Social Support  ADL's:  Intact  Cognition: WNL  Sleep:  Good   Assessment and Plan: 33yo single malewitha long hx of anxiety, depression, problems with focusing, concentration, task completion. He has  been treated for depression/anxiety and ADD in the past(Ritalin). He has a hx of having SI and admits to one suicidal attempt by cutting in August 2019. He admits to a hx of brief 2-3 day long episodes on increased energy, irritability, racing thoughts, hyperactivity, decreased need for sleep. He also remembers having at times paranoid fears while in a hypomanic episode. 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 His depressive episodes are typically much longer and caused much more occupational and social dysfunction.We changedAbilify to Latudafor bipolar depression but he did not respond to this medication.  We the tried  50 mg of quetiapine but he could nottolerate it - sedated, tired the next day.We then started Rexulti for augmentation of duloxetine which he tolerated well and reported clear improvement in mood but has been gradually gaining  weight!We also started methylphenidate xr initially 20 mg daily, then changed toConcertanow at 54 mg strength. Dash reports much improvement in his focusing ability but feels "like crashing in early afternoon). Richardstarted to feelvery paranoid and depressedwith rapid mood swings in late November- his mother called the office and we have increased dose of Geodon to 60 mg at [facility] then to 80 mg. He however could not tolerate it - reported feeling dizzy and having "chest tightness". We stopped ziprasidone and started 1.5 mg of cariprazine which he tolerated well and noticed his mood to beginning to stabilize (he admits to having mildparanoiabut is able to use cognitive skills - knows that fears are unjustified). Even though it has been approved by his insurance price is prohibitive so we have been giving him samples.His mood has stabilized but he wantedto get off as many medications (cariprazoine, duloxetine)as possible while keeping Concerta. He eventually decided to stay on Lamictal. About a month ago he noticed decline in mood, increased anxiety/paranoia. His father started giving him 2 mg of Rexulti which he had from before.   YQ:MGNOIBB 2 disorder, depressed, GAD, ADD  Plan:ContinueLamictal to 150 mg at [facility] Rexulti 1 mg x 1 week then 2 mg in PM. We will try Ritalin LA 40 mg instead of Concerta to see if he will cease to experince PM crash. I will also add temporarily 0.5 mg of alprazolam prn anxiety and metformin 500 mg to prevent weight gain with Rexulti. Next visit in one month.The plan was discussed with patient who had an opportunity to ask questions and these were all answered. I spend59minutes inphone consultation with the patient    Stephanie Acre, MD 04/13/2020, 4:05 PM

## 2020-04-17 ENCOUNTER — Telehealth (HOSPITAL_COMMUNITY): Payer: 59 | Admitting: Psychiatry

## 2020-04-19 ENCOUNTER — Emergency Department (HOSPITAL_COMMUNITY): Payer: 59

## 2020-04-19 ENCOUNTER — Inpatient Hospital Stay (HOSPITAL_COMMUNITY)
Admission: EM | Admit: 2020-04-19 | Discharge: 2020-04-30 | DRG: 885 | Disposition: A | Discharge status: Home / Self Care | Payer: 59 | Source: Intra-hospital | Attending: Psychiatry | Admitting: Psychiatry

## 2020-04-19 ENCOUNTER — Encounter (HOSPITAL_COMMUNITY): Payer: Self-pay | Admitting: Psychiatry

## 2020-04-19 ENCOUNTER — Encounter (HOSPITAL_COMMUNITY): Payer: Self-pay | Admitting: *Deleted

## 2020-04-19 ENCOUNTER — Emergency Department (HOSPITAL_COMMUNITY)
Admission: EM | Admit: 2020-04-19 | Discharge: 2020-04-19 | Disposition: A | Discharge status: Other Acute Inpatient Hospital | Payer: 59 | Attending: Emergency Medicine | Admitting: Emergency Medicine

## 2020-04-19 ENCOUNTER — Other Ambulatory Visit: Payer: Self-pay

## 2020-04-19 DIAGNOSIS — F3181 Bipolar II disorder: Principal | ICD-10-CM | POA: Diagnosis present

## 2020-04-19 DIAGNOSIS — F17290 Nicotine dependence, other tobacco product, uncomplicated: Secondary | ICD-10-CM | POA: Diagnosis not present

## 2020-04-19 DIAGNOSIS — T1491XA Suicide attempt, initial encounter: Secondary | ICD-10-CM | POA: Diagnosis not present

## 2020-04-19 DIAGNOSIS — J029 Acute pharyngitis, unspecified: Secondary | ICD-10-CM | POA: Diagnosis present

## 2020-04-19 DIAGNOSIS — F419 Anxiety disorder, unspecified: Secondary | ICD-10-CM | POA: Diagnosis not present

## 2020-04-19 DIAGNOSIS — T65892A Toxic effect of other specified substances, intentional self-harm, initial encounter: Secondary | ICD-10-CM | POA: Diagnosis present

## 2020-04-19 DIAGNOSIS — Z7984 Long term (current) use of oral hypoglycemic drugs: Secondary | ICD-10-CM | POA: Insufficient documentation

## 2020-04-19 DIAGNOSIS — Y9389 Activity, other specified: Secondary | ICD-10-CM | POA: Diagnosis not present

## 2020-04-19 DIAGNOSIS — Z79899 Other long term (current) drug therapy: Secondary | ICD-10-CM | POA: Diagnosis not present

## 2020-04-19 DIAGNOSIS — F909 Attention-deficit hyperactivity disorder, unspecified type: Secondary | ICD-10-CM | POA: Diagnosis present

## 2020-04-19 DIAGNOSIS — Z915 Personal history of self-harm: Secondary | ICD-10-CM | POA: Diagnosis not present

## 2020-04-19 DIAGNOSIS — Z046 Encounter for general psychiatric examination, requested by authority: Secondary | ICD-10-CM | POA: Diagnosis not present

## 2020-04-19 DIAGNOSIS — X838XXA Intentional self-harm by other specified means, initial encounter: Secondary | ICD-10-CM | POA: Diagnosis not present

## 2020-04-19 DIAGNOSIS — Y999 Unspecified external cause status: Secondary | ICD-10-CM | POA: Insufficient documentation

## 2020-04-19 DIAGNOSIS — Z20822 Contact with and (suspected) exposure to covid-19: Secondary | ICD-10-CM | POA: Diagnosis not present

## 2020-04-19 DIAGNOSIS — T50902A Poisoning by unspecified drugs, medicaments and biological substances, intentional self-harm, initial encounter: Secondary | ICD-10-CM | POA: Diagnosis not present

## 2020-04-19 DIAGNOSIS — G4700 Insomnia, unspecified: Secondary | ICD-10-CM | POA: Diagnosis present

## 2020-04-19 DIAGNOSIS — Y929 Unspecified place or not applicable: Secondary | ICD-10-CM | POA: Diagnosis not present

## 2020-04-19 DIAGNOSIS — F3130 Bipolar disorder, current episode depressed, mild or moderate severity, unspecified: Secondary | ICD-10-CM | POA: Diagnosis not present

## 2020-04-19 DIAGNOSIS — F319 Bipolar disorder, unspecified: Secondary | ICD-10-CM | POA: Diagnosis present

## 2020-04-19 HISTORY — DX: Suicidal ideations: R45.851

## 2020-04-19 HISTORY — DX: Bipolar disorder, unspecified: F31.9

## 2020-04-19 LAB — COMPREHENSIVE METABOLIC PANEL
ALT: 28 U/L (ref 0–44)
AST: 23 U/L (ref 15–41)
Albumin: 4.3 g/dL (ref 3.5–5.0)
Alkaline Phosphatase: 66 U/L (ref 38–126)
Anion gap: 12 (ref 5–15)
BUN: 11 mg/dL (ref 6–20)
CO2: 20 mmol/L — ABNORMAL LOW (ref 22–32)
Calcium: 9.1 mg/dL (ref 8.9–10.3)
Chloride: 109 mmol/L (ref 98–111)
Creatinine, Ser: 0.86 mg/dL (ref 0.61–1.24)
GFR calc Af Amer: >60 mL/min (ref >60)
GFR calc non Af Amer: >60 mL/min (ref >60)
Glucose, Bld: 107 mg/dL — ABNORMAL HIGH (ref 70–99)
Potassium: 4 mmol/L (ref 3.5–5.1)
Sodium: 141 mmol/L (ref 135–145)
Total Bilirubin: 0.5 mg/dL (ref 0.3–1.2)
Total Protein: 7.2 g/dL (ref 6.5–8.1)

## 2020-04-19 LAB — CBC WITH DIFFERENTIAL/PLATELET
Abs Immature Granulocytes: 0.04 10*3/uL (ref 0.00–0.07)
Basophils Absolute: 0.1 10*3/uL (ref 0.0–0.1)
Basophils Relative: 1 %
Eosinophils Absolute: 0 10*3/uL (ref 0.0–0.5)
Eosinophils Relative: 0 %
HCT: 43.7 % (ref 39.0–52.0)
Hemoglobin: 14.6 g/dL (ref 13.0–17.0)
Immature Granulocytes: 0 %
Lymphocytes Relative: 20 %
Lymphs Abs: 2.4 10*3/uL (ref 0.7–4.0)
MCH: 29.6 pg (ref 26.0–34.0)
MCHC: 33.4 g/dL (ref 30.0–36.0)
MCV: 88.6 fL (ref 80.0–100.0)
Monocytes Absolute: 0.8 10*3/uL (ref 0.1–1.0)
Monocytes Relative: 7 %
Neutro Abs: 8.5 10*3/uL — ABNORMAL HIGH (ref 1.7–7.7)
Neutrophils Relative %: 72 %
Platelets: 379 10*3/uL (ref 150–400)
RBC: 4.93 MIL/uL (ref 4.22–5.81)
RDW: 12.7 % (ref 11.5–15.5)
WBC: 11.8 10*3/uL — ABNORMAL HIGH (ref 4.0–10.5)
nRBC: 0 % (ref 0.0–0.2)

## 2020-04-19 LAB — RAPID URINE DRUG SCREEN, HOSP PERFORMED
Amphetamines: NOT DETECTED
Barbiturates: NOT DETECTED
Benzodiazepines: NOT DETECTED
Cocaine: NOT DETECTED
Opiates: NOT DETECTED
Tetrahydrocannabinol: NOT DETECTED

## 2020-04-19 LAB — ETHANOL: Alcohol, Ethyl (B): <10 mg/dL (ref <10)

## 2020-04-19 LAB — SALICYLATE LEVEL: Salicylate Lvl: <7 mg/dL — ABNORMAL LOW (ref 7.0–30.0)

## 2020-04-19 LAB — ACETAMINOPHEN LEVEL: Acetaminophen (Tylenol), Serum: <10 ug/mL — ABNORMAL LOW (ref 10–30)

## 2020-04-19 LAB — SARS CORONAVIRUS 2 BY RT PCR (HOSPITAL ORDER, PERFORMED IN ~~LOC~~ HOSPITAL LAB): SARS Coronavirus 2: NEGATIVE

## 2020-04-19 MED ORDER — METFORMIN HCL 500 MG PO TABS: 500.0000 mg | ORAL_TABLET | Freq: Every day | ORAL | Status: DC

## 2020-04-19 MED ORDER — ASCORBIC ACID 500 MG PO TABS
1000.0000 mg | ORAL_TABLET | Freq: Every day | ORAL | Status: DC
  Administered 2020-04-19: 1000 mg via ORAL
  Filled 2020-04-19: qty 2

## 2020-04-19 MED ORDER — ADULT MULTIVITAMIN W/MINERALS CH
1.0000 | ORAL_TABLET | Freq: Every day | ORAL | Status: DC
  Administered 2020-04-19: 1 via ORAL
  Filled 2020-04-19: qty 1

## 2020-04-19 MED ORDER — ALUM & MAG HYDROXIDE-SIMETH 200-200-20 MG/5ML PO SUSP
30.0000 mL | Freq: Four times a day (QID) | ORAL | Status: DC | PRN
  Filled 2020-04-19: qty 30

## 2020-04-19 MED ORDER — VITAMIN D 25 MCG (1000 UNIT) PO TABS
2000.0000 [IU] | ORAL_TABLET | Freq: Every day | ORAL | Status: DC
  Administered 2020-04-19: 2000 [IU] via ORAL
  Filled 2020-04-19: qty 2

## 2020-04-19 MED ORDER — NICOTINE 21 MG/24HR TD PT24
21.0000 mg | MEDICATED_PATCH | Freq: Every day | TRANSDERMAL | Status: DC
  Administered 2020-04-19: 21 mg via TRANSDERMAL
  Filled 2020-04-19: qty 1

## 2020-04-19 MED ORDER — LAMOTRIGINE 150 MG PO TABS
150.0000 mg | ORAL_TABLET | Freq: Every day | ORAL | Status: DC
  Administered 2020-04-19: 150 mg via ORAL
  Filled 2020-04-19 (×2): qty 1

## 2020-04-19 MED ORDER — BREXPIPRAZOLE 2 MG PO TABS
2.0000 mg | ORAL_TABLET | Freq: Every day | ORAL | Status: DC
  Administered 2020-04-19: 2 mg via ORAL
  Filled 2020-04-19 (×2): qty 1

## 2020-04-19 MED ORDER — OMEGA-3-ACID ETHYL ESTERS 1 G PO CAPS
1.0000 g | ORAL_CAPSULE | Freq: Every day | ORAL | Status: DC
  Administered 2020-04-19: 1 g via ORAL
  Filled 2020-04-19 (×2): qty 1

## 2020-04-19 MED ORDER — ALPRAZOLAM 0.25 MG PO TABS
0.5000 mg | ORAL_TABLET | Freq: Two times a day (BID) | ORAL | Status: DC | PRN
  Filled 2020-04-19 (×2): qty 1

## 2020-04-19 MED ORDER — METHYLPHENIDATE HCL ER (OSM) 27 MG PO TBCR: 54.0000 mg | EXTENDED_RELEASE_TABLET | ORAL | Status: DC

## 2020-04-19 NOTE — ED Notes (Signed)
Assumed care on patient , patient sleeping with no distress, respirations unlabored , no pain . Sitter with patient .

## 2020-04-19 NOTE — ED Notes (Signed)
BH SW aware of TTS order. Pt lying down on bed w/eyes closed. Respirations even, unlabored. Offered pt Xanax prior to lying down - advised he does not need or want it at this time. Denies feeling anxious.

## 2020-04-19 NOTE — BH Assessment (Addendum)
Comprehensive Clinical Assessment (CCA) Note  04/19/2020 Adam Canterburyichard Bednarz Larsen. [id number]  Visit Diagnosis:      ICD-10-CM   1. Suicide attempt by ingestion of unknown substance [facility](HCC)  T50.902A     Adam Larsen is a [age] year old male who [facility] Clinician answered pt's questions pertaining to the assessment process including possible dispositions. Clinician asked the pt, "what brought you to the hospital?" Pt reported, he's had suicidal thoughts for a long time now. Pt reported, today, he tried to electrocuted him with a blow dryer in the bathtub but the safety was on, he then tried cutting his wrist with a knife and he swallowed cleaning products; Oxyclean, mouthwash and Nexium as suicide attempts. Pt reported, h still feels somewhat suicidal. Pt reported, hearing things a few months ago. Pt denies, HI, AVH.   Pt was IVC'd by EDP. Per IVC paperwork: "Patient overdose on medicine and other cleaning substances and is admitted suicide attempt. States that he is sucidal still and would still attempt to hurt himself."   Pt presents alert, with logical, coherent speech. Pt's eye contact was good. Pt's mood, affect was depressed, anxious. Pt's thought process was coherent, relevant. Pt's judgement was impaired. Pt was oriented x4. Pt's concentration was normal. Pt's insight was fair. Pt's impulse control was poor. Clinician discussed the three possible dispositions (discharged with OPT resources, observe/reassess by psychiatry or inpatient treatment) in detail.    Disposition: Per Hassie BruceKim, AC, RN pt [facility] and assigned to room/bed: 300-2, pt can come tonight. Attending physician: Dr. Jola Babinskilary. Nursing report: [phone]. Disposition discussed with Dr. Adela LankFloyd and Molly Maduroobert, RN.   Diagnosis: Bipolar 2 disorder (HCC)  CCA Screening, Triage and Referral (STR)  Patient Reported Information How did you hear about us? No data recorded Referral name: No  data recorded Referral phone number: No data recorded  Whom do you see for routine medical problems? I don't have a doctor  Practice/Facility Name: No data recorded Practice/Facility Phone Number: No data recorded Name of Contact: No data recorded Contact Number: No data recorded Contact Fax Number: No data recorded Prescriber Name: No data recorded Prescriber Address (if known): No data recorded  What Is the Reason for Your Visit/Call Today? No data recorded How Long Has This Been Causing You Problems? No data recorded What Do You Feel Would Help You the Most Today? No data recorded  Have You Recently Been in Any Inpatient Treatment (Hospital/Detox/Crisis Center/28-Day Program)? No data recorded Name/Location of Program/Hospital:No data recorded How Long Were You There? No data recorded When Were You Discharged? No data recorded  Have You Ever Received Services From [facility] Before? Yes  [facility] Dr. Donell BeersPlovsky.   Have You Recently Had Any Thoughts About Hurting Yourself? Yes  Are You Planning to Commit Suicide/Harm Yourself At This time? No data recorded  Have you Recently Had Thoughts About Hurting Someone Karolee Ohslse? No  Explanation: No data recorded  Have You Used Any Alcohol or Drugs in the Past 24 Hours? No  How Long Ago Did You Use Drugs or Alcohol? No data recorded What Did You Use and How Much? No data recorded  Do You Currently Have a Therapist/Psychiatrist? Yes  Name of Therapist/Psychiatrist: Dr. Donell BeersPlovsky, psychiatrist.   Have You Been Recently Discharged From Any Office Practice or Programs? No data recorded Explanation of Discharge From Practice/Program: No data recorded    CCA Screening Triage Referral Assessment Type of  Contact: Tele-Assessment  Is this Initial or Reassessment? Initial Assessment  Date Telepsych consult ordered in [facility]:  04/19/20  Time Telepsych consult ordered in [facility]:  1626   Patient Reported Information  Reviewed? Yes  Patient Left Without Being Seen? No data recorded Reason for Not Completing Assessment: No data recorded  Collateral Involvement: No data recorded  Does Patient Have a Court Appointed Legal Guardian? No data recorded Name and Contact of Legal Guardian: No data recorded If Minor and Not Living with Parent(s), Who has Custody? No data recorded Is CPS involved or ever been involved? Never  Is APS involved or ever been involved? Never   Patient Determined To Be At Risk for Harm To Self or Others Based on Review of Patient Reported Information or Presenting Complaint? Yes, for Self-Harm  Method: No data recorded Availability of Means: No data recorded Intent: No data recorded Notification Required: No data recorded Additional Information for Danger to Others Potential: No data recorded Additional Comments for Danger to Others Potential: No data recorded Are There Guns or Other Weapons in Your Home? No data recorded Types of Guns/Weapons: No data recorded Are These Weapons Safely Secured?                            No data recorded Who Could Verify You Are Able To Have These Secured: No data recorded Do You Have any Outstanding Charges, Pending Court Dates, Parole/Probation? No data recorded Contacted To Inform of Risk of Harm To Self or Others: No data recorded  Location of Assessment: [facility] ED   Does Patient Present under Involuntary Commitment? Yes  IVC Papers Initial File Date: 04/19/20   [place] of Residence: [place]   Patient Currently Receiving the Following Services: Medication Management   Determination of Need: No data recorded  Options For Referral: Inpatient Hospitalization     CCA Biopsychosocial  Intake/Chief Complaint:     Mental Health Symptoms Depression:     Mania:     Anxiety:      Psychosis:     Trauma:     Obsessions:     Compulsions:     Inattention:     Hyperactivity/Impulsivity:     Oppositional/Defiant Behaviors:      Emotional Irregularity:     Other Mood/Personality Symptoms:      Mental Status Exam Appearance and self-care  Stature:     Weight:     Clothing:     Grooming:     Cosmetic use:     Posture/gait:     Motor activity:     Sensorium  Attention:     Concentration:     Orientation:     Recall/memory:     Affect and Mood  Affect:     Mood:     Relating  Eye contact:     Facial expression:     Attitude toward examiner:     Thought and Language  Speech flow:    Thought content:     Preoccupation:     Hallucinations:     Organization:     Company secretary of Knowledge:     Intelligence:     Abstraction:     Judgement:     Reality Testing:     Insight:     Decision Making:     Social Functioning  Social Maturity:     Social Judgement:     Stress  Stressors:  Coping Ability:     Skill Deficits:     Supports:        Religion:    Leisure/Recreation:    Exercise/Diet:     CCA Employment/Education  Employment/Work Situation:    Education:     CCA Family/Childhood History  Family and Relationship History:    Childhood History:     Child/Adolescent Assessment:     CCA Substance Use  Alcohol/Drug Use:                           ASAM's:  Six Dimensions of Multidimensional Assessment  Dimension 1:  Acute Intoxication and/or Withdrawal Potential:      Dimension 2:  Biomedical Conditions and Complications:      Dimension 3:  Emotional, Behavioral, or Cognitive Conditions and Complications:     Dimension 4:  Readiness to Change:     Dimension 5:  Relapse, Continued use, or Continued Problem Potential:     Dimension 6:  Recovery/Living Environment:     ASAM Severity Score:    ASAM Recommended Level of Treatment:     Substance use Disorder (SUD)    Recommendations for Services/Supports/Treatments:    DSM5 Diagnoses: Patient Active Problem List   Diagnosis Date Noted  . Bipolar 2 disorder (HCC) 01/09/2019  .  GAD (generalized anxiety disorder) 01/09/2019  . ADHD, predominantly inattentive type 01/09/2019    Referrals to Alternative Service(s): Referred to Alternative Service(s):   Place:   Date:   Time:    Referred to Alternative Service(s):   Place:   Date:   Time:    Referred to Alternative Service(s):   Place:   Date:   Time:    Referred to Alternative Service(s):   Place:   Date:   Time:     Redmond Pulling  Comprehensive Clinical Assessment (CCA) Screening, Triage and Referral Note  04/19/2020 Adam Sear. [id number]  Visit Diagnosis:    ICD-10-CM   1. Suicide attempt by ingestion of unknown substance [facility])  T50.902A     Patient Reported Information How did you hear about [place]? No data recorded  Referral name: No data recorded  Referral phone number: No data recorded Whom do you see for routine medical problems? I don't have a doctor   Practice/Facility Name: No data recorded  Practice/Facility Phone Number: No data recorded  Name of Contact: No data recorded  Contact Number: No data recorded  Contact Fax Number: No data recorded  Prescriber Name: No data recorded  Prescriber Address (if known): No data recorded What Is the Reason for Your Visit/Call Today? No data recorded How Long Has This Been Causing You Problems? No data recorded Have You Recently Been in Any Inpatient Treatment (Hospital/Detox/Crisis Center/28-Day Program)? No data recorded  Name/Location of Program/Hospital:No data recorded  How Long Were You There? No data recorded  When Were You Discharged? No data recorded Have You Ever Received Services From [facility] Before? Yes   Who Do You See at [facility]? Dr. Donell Beers.  Have You Recently Had Any Thoughts About Hurting Yourself? Yes   Are You Planning to Commit Suicide/Harm Yourself At This time?  No data recorded Have you Recently Had Thoughts About Hurting Someone Karolee Ohs? No   Explanation: No data recorded Have You Used Any Alcohol or Drugs in  the Past 24 Hours? No   How Long Ago Did You Use Drugs or Alcohol?  No data recorded  What Did You Use  and How Much? No data recorded What Do You Feel Would Help You the Most Today? No data recorded Do You Currently Have a Therapist/Psychiatrist? Yes   Name of Therapist/Psychiatrist: Dr. Donell Beers, psychiatrist.   Have You Been Recently Discharged From Any Office Practice or Programs? No data recorded  Explanation of Discharge From Practice/Program:  No data recorded    CCA Screening Triage Referral Assessment Type of Contact: Tele-Assessment   Is this Initial or Reassessment? Initial Assessment   Date Telepsych consult ordered in [facility]:  04/19/20   Time Telepsych consult ordered in [facility]:  1626  Patient Reported Information Reviewed? Yes   Patient Left Without Being Seen? No data recorded  Reason for Not Completing Assessment: No data recorded Collateral Involvement: No data recorded Does Patient Have a Court Appointed Legal Guardian? No data recorded  Name and Contact of Legal Guardian:  No data recorded If Minor and Not Living with Parent(s), Who has Custody? No data recorded Is CPS involved or ever been involved? Never  Is APS involved or ever been involved? Never  Patient Determined To Be At Risk for Harm To Self or Others Based on Review of Patient Reported Information or Presenting Complaint? Yes, for Self-Harm   Method: No data recorded  Availability of Means: No data recorded  Intent: No data recorded  Notification Required: No data recorded  Additional Information for Danger to Others Potential:  No data recorded  Additional Comments for Danger to Others Potential:  No data recorded  Are There Guns or Other Weapons in Your Home?  No data recorded   Types of Guns/Weapons: No data recorded   Are These Weapons Safely Secured?                              No data recorded   Who Could Verify You Are Able To Have These Secured:    No data recorded Do You Have any  Outstanding Charges, Pending Court Dates, Parole/Probation? No data recorded Contacted To Inform of Risk of Harm To Self or Others: No data recorded Location of Assessment: [facility] ED  Does Patient Present under Involuntary Commitment? Yes   IVC Papers Initial File Date: 04/19/20   [place] of Residence: [place]  Patient Currently Receiving the Following Services: Medication Management   Determination of Need: No data recorded  Options For Referral: Inpatient Hospitalization   Redmond Pulling, [facility]     Redmond Pulling, MS, [facility], [facility] Triage Specialist [phone]

## 2020-04-19 NOTE — ED Notes (Addendum)
Report given to [facility] RN at [facility] , patient assigned to room #300-2 , receiving MD Dr. Tamera Punt . Patient's personal belongings sent home with family per day shift nurse .

## 2020-04-19 NOTE — BHH Counselor (Signed)
Clinician spoke to Robby, RN to express he she will be ready to assess the pt in 5-10 minutes. RN to locate TTS cart.    Redmond Pulling, MS, [facility], [facility] Triage Specialist [phone].

## 2020-04-19 NOTE — ED Notes (Signed)
Spoke with poison control.  They stated they will close the case.  However, they suggest that if the burning continues, maybe he should follow up with GI.

## 2020-04-19 NOTE — ED Notes (Signed)
TTS video interview in progress .  

## 2020-04-19 NOTE — ED Notes (Signed)
Pt changed into hospital gown and placed on heart monitor. Pt removed of personal clothing except for underwear. Pt belongings placed into belonging bags, inventoried, and placed in purple zone locker #1. Pt refused to sign paper stating belongings were inventoried, stating he wasn't willing to sign any papers.

## 2020-04-19 NOTE — ED Triage Notes (Signed)
Pt here from home via [facility] after ingesting at 10:20 -   Downy 1.5 cups Oxiclean 1.5 cups Mouthwash 3 cups Fragrance spray  - unknown amount.   Poison control was contacted by [facility].  They stated  To look out for blisters in the throat that would compromise the airway, as well as nvd.  So far, airway is patent.  No blistering noted.  Pt given 4 mg zofran and 1100 ml ns given en-route.

## 2020-04-19 NOTE — ED Notes (Addendum)
Pt arrived to Rm 50 - ambulatory - wearing eyeglasses and burgundy scrubs. Sitter w/pt. Mother present. Pt is under IVC - Pt and mother aware. Also voiced understanding of Medical Clearance Pt Policies and waiting on TTS. Pt then states "I shouldn't have sign that paper stating it was ok to keep me here on a 72-hour hold". Pt's mother given ALL of pt's belongings as requested by pt. Mother advised pt has been on several medications and that Rexulti has seemed to work well until recently. States he sees Dr Clyde Lundborg (sp?) and wants to change to different psychiatrist. States pt advised her they would be better off w/o him. Pt on phone at nurses' desk talking to his father. Copy of IVC paperwork faxed to [facility] - ALL 3 sets on clipboard. Gray Arm band applied to pt's right wrist.

## 2020-04-19 NOTE — ED Notes (Signed)
Belongings removed and inventoried. 1 bag placed in Locker #1 In scrubs, wanded by security. Glasses remains with patient

## 2020-04-19 NOTE — ED Provider Notes (Addendum)
[facility] EMERGENCY DEPARTMENT Provider Note   CSN: [id number] Arrival date & time: 04/19/20  1156     History Chief Complaint  Patient presents with  . Ingestion  . Suicidal    Adam Larsen. is a 34 y.o. male.  HPI Patient presents by EMS after ingesting several different things in a suicide attempt at around 1030 today.  Drank 1.5 cups of Downey.  Drank 1.5 cups of Oxyclean.  Drink 3 cups of mouthwash.  Drink some frequent spray.  Also reportedly took 14 tabs of Nexium.  It was admitted suicide attempt.  States he still is suicidal and would still try to hurt himself.  They discussed with poison control earlier and told to come in the hospital.  Patient complaining of some pain in his throat and states he feels a little sleepy.  Did vomit almost immediately after drinking the liquids.    Past Medical History:  Diagnosis Date  . Anxiety   . Bipolar 1 disorder (HCC)   . Depression   . Mania (HCC)   . Suicidal ideations     Patient Active Problem List   Diagnosis Date Noted  . Bipolar 2 disorder (HCC) 01/09/2019  . GAD (generalized anxiety disorder) 01/09/2019  . ADHD, predominantly inattentive type 01/09/2019    Past Surgical History:  Procedure Laterality Date  . WISDOM TOOTH EXTRACTION         Family History  Problem Relation Age of Onset  . Schizophrenia Paternal Grandmother     Social History   Tobacco Use  . Smoking status: Current Every Day Smoker    Types: E-cigarettes  . Smokeless tobacco: Never Used  Vaping Use  . Vaping Use: Every day  . Substances: Nicotine, Flavoring, Nicotine-salt  Substance Use Topics  . Alcohol use: Never  . Drug use: Never    Home Medications Prior to Admission medications   Medication Sig Start Date End Date Taking? Authorizing Provider  ALPRAZolam Prudy Feeler) 0.5 MG tablet Take 1 tablet (0.5 mg total) by mouth 2 (two) times daily as needed for anxiety. 04/13/20 05/13/20  Pucilowski, Roosvelt Maser, MD    brexpiprazole (REXULTI) 1 MG TABS tablet Take 1 tablet (1 mg total) by mouth daily for 7 days, THEN 2 tablets (2 mg total) daily. 04/13/20 05/20/20  Pucilowski, Roosvelt Maser, MD  lamoTRIgine (LAMICTAL) 150 MG tablet Take 1 tablet (150 mg total) by mouth at bedtime. 03/13/20 06/11/20  Pucilowski, Roosvelt Maser, MD  metFORMIN (GLUCOPHAGE) 500 MG tablet Take 1 tablet (500 mg total) by mouth daily with breakfast. 04/13/20 07/12/20  Pucilowski, Roosvelt Maser, MD  methylphenidate (RITALIN LA) 40 MG 24 hr capsule Take 1 capsule (40 mg total) by mouth every morning. 04/13/20 05/13/20  Pucilowski, Roosvelt Maser, MD    Allergies    Patient has no known allergies.  Review of Systems   Review of Systems  Constitutional: Negative for appetite change.  HENT: Positive for sore throat. Negative for dental problem and trouble swallowing.   Respiratory: Negative for shortness of breath.   Gastrointestinal: Negative for abdominal pain.  Genitourinary: Negative for flank pain.  Musculoskeletal: Negative for back pain.  Skin: Negative for wound.  Neurological: Negative for weakness.  Psychiatric/Behavioral: Positive for suicidal ideas.    Physical Exam Updated Vital Signs BP 122/85   Pulse 96   Temp 98.3 F (36.8 C) (Oral)   Resp 20   Ht 5' 11.5" (1.816 m)   Wt 90.7 kg   SpO2 97%  BMI 27.51 kg/m   Physical Exam Vitals and nursing note reviewed.  HENT:     Head: Atraumatic.     Mouth/Throat:     Pharynx: No posterior oropharyngeal erythema.  Eyes:     Pupils: Pupils are equal, round, and reactive to light.  Pulmonary:     Effort: Pulmonary effort is normal.  Abdominal:     Tenderness: There is no abdominal tenderness.  Musculoskeletal:        General: No tenderness.     Cervical back: Neck supple.  Skin:    Capillary Refill: Capillary refill takes less than 2 seconds.  Neurological:     Mental Status: He is alert and oriented to person, place, and time.  Psychiatric:     Comments: Somewhat flat affect.      ED Results / Procedures / Treatments   Labs (all labs ordered are listed, but only abnormal results are displayed) Labs Reviewed  COMPREHENSIVE METABOLIC PANEL - Abnormal; Notable for the following components:      Result Value   CO2 20 (*)    Glucose, Bld 107 (*)    All other components within normal limits  ACETAMINOPHEN LEVEL - Abnormal; Notable for the following components:   Acetaminophen (Tylenol), Serum <10 (*)    All other components within normal limits  SALICYLATE LEVEL - Abnormal; Notable for the following components:   Salicylate Lvl <7.0 (*)    All other components within normal limits  CBC WITH DIFFERENTIAL/PLATELET - Abnormal; Notable for the following components:   WBC 11.8 (*)    Neutro Abs 8.5 (*)    All other components within normal limits  SARS CORONAVIRUS 2 BY RT PCR [facility] ORDER, PERFORMED IN [facility])  ETHANOL    EKG EKG Interpretation  Date/Time:  Wednesday April 19 2020 12:08:41 EDT Ventricular Rate:  93 PR Interval:    QRS Duration: 80 QT Interval:  344 QTC Calculation: 428 R Axis:   60 Text Interpretation: Sinus rhythm RSR' in V1 or V2, probably normal variant Confirmed by Benjiman Core [phone]) on 04/19/2020 1:37:04 PM   Radiology No results found.  Procedures Procedures (including critical care time)  Medications Ordered in ED Medications - No data to display  ED Course  I have reviewed the triage vital signs and the nursing notes.  Pertinent labs & imaging results that were available during my care of the patient were reviewed by me and considered in my medical decision making (see chart for details).    MDM Rules/Calculators/A&P                          Patient presents after a suicide attempt.  Drink few different cleaning liquids and potentially some other pills.  Discussed with poison control.  If patient can tolerate orals after 2 hours of monitoring patient can be cleared from the ingestion  standpoint.  Will be able to drink after that.  However does need 6 hours total monitoring to watch for other problems besides the airway/esophagus issue.  Lab work reassuring.  At around 430 patient will be medically cleared.  Care will be turned over to oncoming provider.  Patient has drank liquid.  Still complaining of sore throat however.  IVC paperwork has been done and turned in since patient states he is still suicidal and still would attempt to hurt himself. Final Clinical Impression(s) / ED Diagnoses Final diagnoses:  Suicide attempt by ingestion of unknown substance (HCC)  Rx / DC Orders ED Discharge Orders    None       Benjiman Core, MD 04/19/20 1408    Benjiman Core, MD 04/19/20 1443    Benjiman Core, MD 04/19/20 [phone]

## 2020-04-19 NOTE — ED Notes (Signed)
[facility] notified to transport [facility] to [facility] .

## 2020-04-19 NOTE — ED Provider Notes (Signed)
34 yo M with a chief complaints of suicide attempt.  Patient tried to ingest multiple things earlier today to try and end his life.  Discussion with poison control recommended observation until 4:30 PM today.  I reassessed the patient and he continues to feel well.  States he has some very mild burning with swallowing.  Is been able to tolerate p.o. without issue.  No drooling.  No signs of damage to the posterior oropharynx.  At this point I feel he is medically clear.  TTS evaluation.   Melene Plan, DO 04/19/20 1627

## 2020-04-19 NOTE — ED Notes (Signed)
Pt states he would like his mother and father to have access to all of his files.

## 2020-04-20 ENCOUNTER — Encounter (HOSPITAL_COMMUNITY): Payer: Self-pay | Admitting: Behavioral Health

## 2020-04-20 DIAGNOSIS — F3181 Bipolar II disorder: Principal | ICD-10-CM

## 2020-04-20 DIAGNOSIS — F319 Bipolar disorder, unspecified: Secondary | ICD-10-CM

## 2020-04-20 LAB — GLUCOSE, CAPILLARY: Glucose-Capillary: 114 mg/dL — ABNORMAL HIGH (ref 70–99)

## 2020-04-20 MED ORDER — MAGNESIUM HYDROXIDE 400 MG/5ML PO SUSP: 30.0000 mL | Freq: Every day | ORAL | Status: DC | PRN

## 2020-04-20 MED ORDER — NICOTINE 21 MG/24HR TD PT24
21.0000 mg | MEDICATED_PATCH | Freq: Every day | TRANSDERMAL | Status: DC
  Administered 2020-04-20 – 2020-04-30 (×11): 21 mg via TRANSDERMAL
  Filled 2020-04-20 (×14): qty 1

## 2020-04-20 MED ORDER — LAMOTRIGINE 150 MG PO TABS
150.0000 mg | ORAL_TABLET | Freq: Every day | ORAL | Status: DC
  Administered 2020-04-20 – 2020-04-21 (×2): 150 mg via ORAL
  Filled 2020-04-20 (×4): qty 1

## 2020-04-20 MED ORDER — ALUM & MAG HYDROXIDE-SIMETH 200-200-20 MG/5ML PO SUSP: 30.0000 mL | ORAL | Status: DC | PRN

## 2020-04-20 MED ORDER — METFORMIN HCL 500 MG PO TABS
500.0000 mg | ORAL_TABLET | Freq: Every day | ORAL | Status: DC
  Administered 2020-04-20 – 2020-04-21 (×2): 500 mg via ORAL
  Filled 2020-04-20 (×4): qty 1

## 2020-04-20 MED ORDER — PANTOPRAZOLE SODIUM 40 MG PO TBEC
40.0000 mg | DELAYED_RELEASE_TABLET | Freq: Every day | ORAL | Status: DC
  Administered 2020-04-21: 40 mg via ORAL
  Filled 2020-04-20 (×5): qty 1

## 2020-04-20 MED ORDER — HYDROXYZINE HCL 25 MG PO TABS: 25.0000 mg | ORAL_TABLET | Freq: Three times a day (TID) | ORAL | Status: DC | PRN

## 2020-04-20 MED ORDER — HALOPERIDOL 1 MG PO TABS
1.0000 mg | ORAL_TABLET | Freq: Two times a day (BID) | ORAL | Status: DC
  Administered 2020-04-21: 1 mg via ORAL
  Filled 2020-04-20 (×5): qty 1

## 2020-04-20 MED ORDER — TRAZODONE HCL 50 MG PO TABS: 50.0000 mg | ORAL_TABLET | Freq: Every evening | ORAL | Status: DC | PRN

## 2020-04-20 MED ORDER — ACETAMINOPHEN 325 MG PO TABS: 650.0000 mg | ORAL_TABLET | Freq: Four times a day (QID) | ORAL | Status: DC | PRN

## 2020-04-20 NOTE — H&P (Signed)
Psychiatric Admission Assessment Adult  Patient Identification: Adam Larsen. MRN:  [id number] Date of Evaluation:  04/20/2020 Chief Complaint:  Bipolar 2 disorder (HCC) [F31.81] Bipolar disorder (HCC) [F31.9] Principal Diagnosis: <principal problem not specified> Diagnosis:  Active Problems:   Bipolar 2 disorder (HCC)   Bipolar disorder (HCC)  History of Present Illness: Patient is seen and examined. Patient is a 34 year old male with a reported past psychiatric history significant for depression, anxiety, hypomania as well as suicidal ideation and brought to ED after his suicidal attempt. He stated that he overdosed on OxyClean (1.5 cups), Downy (1.5 cups), fragrance spray and Nexium (14 tablets). He said that he was depressed and hopeless and has been same for a long time.  Patient is  IVC'd here. After evaluation the decision was made to admitting him to the hospital.This morning he is still suicidal and plan to kill himself with shower curtain. He states that he has been feeling increased hopelessness and suicidal ideation for the last 2 months. He endorses suicidal thoughts but contracts for safety. He endorses auditory hallucinations saying that he hears "chatter" but he can still focus his thoughts. He began experiencing AH about 10-12 years ago.  Pt states that he has no current stressors but generally he feels hopeless.When speaking about his most recent suicide attempt, he explained that he was in a bad place and not feeling good about himself. His first suicide plan was to hang himself and then he tried to electrocute himself by throwing clippers in water but the clippers didn't have enough charge to do the job so he drank the caustic liquids.  He lives with his parents who are his social support as well as seeing a psychiatrist. He has few friends but they don't hang out often. He spend most of his time playing online video games and browsing wikipedia.He stated that he has been  diagnosed with ADHD, dyslexia, dysgraphia, and learning disabilities.He told the last grade he completed was 10th and them completed online GED.   Patient current medication includes Lamictal, Methylphenidate, and Rexulti (been taking for about a week). He has tried various medication but did not work for him.  Pt wants to work on feeling motivated to do things with his life and to have life goals. He also wants medication management. Associated Signs/Symptoms: Depression Symptoms:  feelings of worthlessness/guilt, hopelessness, impaired memory, recurrent thoughts of death, suicidal thoughts with specific plan, suicidal attempt, anxiety, (Hypo) Manic Symptoms:  Elevated Mood, Flight of Ideas, Grandiosity, Hallucinations, Anxiety Symptoms:  Excessive Worry, Social Anxiety, Psychotic Symptoms:  Hallucinations: Olfactory Paranoia, PTSD Symptoms: NA Total Time spent with patient: 45 minutes  Past Psychiatric History: Patient is a 34 year old single malewitha long hx of anxiety, depression, problems with focusing, concentration, task completion. He has been treated for depression/anxiety and ADD in the past. He has a hx of having SI and admits to one suicidal attempt by cutting in August 2019. He admits to a hx of brief 2-3 day long episodes on increased energy, irritability, racing thoughts, hyperactivity, decreased need for sleep. He also remembers having at times paranoid fears while in a hypomanic episode. 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 His depressive episodes are typically much longer and caused much more occupational and social dysfunction.HisAbilify was changed to Latudafor bipolar depression but he did not respond to this medication.  He also tried  50 mg of quetiapine but he could nottolerate it - sedated, tired  the next day.He was then started on Rexulti for augmentation of duloxetine which he tolerated well and reported clear  improvement in mood but has been gradually gaining weight!Also, started methylphenidate xr initially 20 mg daily, then changed toConcertanow at 54 mg strength. He reported much improvement in his focusing ability but feels "like crashing in early afternoon). Hestarted to feelvery paranoid and depressedwith rapid mood swings in late November- his mother called the psychiatrist office and they increased dose of Geodon to 60 mg at [facility] then to 80 mg. He however could not tolerate it - reported feeling dizzy and having "chest tightness". Then ziprasidone was stopped and started 1.5 mg of cariprazine which he tolerated well and noticed his mood to beginning to stabilize (he admits to having mildparanoiabut is able to use cognitive skills - knows that fears are unjustified). Even though it has been approved by his insurance, price is prohibitive so he has been getting  samples.His mood has stabilized but he wantedto get off as many medications (cariprazoine, duloxetine)as possible while keeping Concerta. He eventually decided to stay onLamictal. He noticed decline in mood, increased anxiety/paranoia. His father started giving him 2 mg of Rexulti which he had from before.   Is the patient at risk to self? Yes.    Has the patient been a risk to self in the past 6 months? Yes.    Has the patient been a risk to self within the distant past? Yes.    Is the patient a risk to others? No.  Has the patient been a risk to others in the past 6 months? No.  Has the patient been a risk to others within the distant past? No.   Prior Inpatient Therapy:   Prior Outpatient Therapy:    Alcohol Screening: 1. How often do you have a drink containing alcohol?: Never 2. How many drinks containing alcohol do you have on a typical day when you are drinking?: 1 or 2 3. How often do you have six or more drinks on one occasion?: Never AUDIT-C Score: 0 9. Have you or someone else been injured as a result of your  drinking?: No 10. Has a relative or friend or a doctor or another health worker been concerned about your drinking or suggested you cut down?: No Alcohol Use Disorder Identification Test Final Score (AUDIT): 0 Alcohol Brief Interventions/Follow-up: AUDIT Score <7 follow-up not indicated Substance Abuse History in the last 12 months:  No. Consequences of Substance Abuse: NA Previous Psychotropic Medications: Yes  Psychological Evaluations: Yes  Past Medical History:  Past Medical History:  Diagnosis Date   Anxiety    Bipolar 1 disorder (HCC)    Depression    Diabetes mellitus without complication (HCC)    Mania (HCC)    Suicidal ideations     Past Surgical History:  Procedure Laterality Date   WISDOM TOOTH EXTRACTION     Family History:  Family History  Problem Relation Age of Onset   Schizophrenia Paternal Grandmother    Family Psychiatric  History: Schizophrenia in paternal grandmother Tobacco Screening: Have you used any form of tobacco in the last 30 days? (Cigarettes, Smokeless Tobacco, Cigars, and/or Pipes): Yes Tobacco use, Select all that apply: 5 or more cigarettes per day Are you interested in Tobacco Cessation Medications?: Yes, will notify MD for an order Counseled patient on smoking cessation including recognizing danger situations, developing coping skills and basic information about quitting provided: Refused/Declined practical counseling Social History:  Social History   Substance  and Sexual Activity  Alcohol Use Never     Social History   Substance and Sexual Activity  Drug Use Never    Additional Social History:                           Allergies:  No Known Allergies Lab Results:  Results for orders placed or performed during the hospital encounter of 04/19/20 (from the past 48 hour(s))  Glucose, capillary     Status: Abnormal   Collection Time: 04/20/20 12:25 PM  Result Value Ref Range   Glucose-Capillary 114 (H) 70 - 99 mg/dL     Comment: Glucose reference range applies only to samples taken after fasting for at least 8 hours.    Blood Alcohol level:  Lab Results  Component Value Date   ETH <10 04/19/2020    Metabolic Disorder Labs:  No results found for: HGBA1C, MPG No results found for: PROLACTIN No results found for: CHOL, TRIG, HDL, CHOLHDL, VLDL, LDLCALC  Current Medications: Current Facility-Administered Medications  Medication Dose Route Frequency Provider Last Rate Last Admin   acetaminophen (TYLENOL) tablet 650 mg  650 mg Oral Q6H PRN [name], [name], NP       alum & mag hydroxide-simeth (MAALOX/MYLANTA) 200-200-20 MG/5ML suspension 30 mL  30 mL Oral Q4H PRN Antonieta Pertlary, Greg Lawson, MD       hydrOXYzine (ATARAX/VISTARIL) tablet 25 mg  25 mg Oral TID PRN Antonieta Pertlary, Greg Lawson, MD       lamoTRIgine (LAMICTAL) tablet 150 mg  150 mg Oral QHS Antonieta Pertlary, Greg Lawson, MD       magnesium hydroxide (MILK OF MAGNESIA) suspension 30 mL  30 mL Oral Daily PRN Antonieta Pertlary, Greg Lawson, MD       metFORMIN (GLUCOPHAGE) tablet 500 mg  500 mg Oral Q breakfast Antonieta Pertlary, Greg Lawson, MD   500 mg at 04/20/20 1257   nicotine (NICODERM CQ - dosed in mg/24 hours) patch 21 mg  21 mg Transdermal Daily [name], [name], NP   21 mg at 04/20/20 0751   traZODone (DESYREL) tablet 50 mg  50 mg Oral QHS PRN Antonieta Pertlary, Greg Lawson, MD       PTA Medications: Medications Prior to Admission  Medication Sig Dispense Refill Last Dose   Ascorbic Acid (VITAMIN C) 1000 MG tablet Take 2,000 mg by mouth daily.   04/19/2020 at Unknown time   brexpiprazole (REXULTI) 1 MG TABS tablet Take 2 mg by mouth daily.   04/19/2020 at Unknown time   cholecalciferol (VITAMIN D3) 25 MCG (1000 UNIT) tablet Take 2,000 Units by mouth daily.   04/19/2020 at Unknown time   lamoTRIgine (LAMICTAL) 150 MG tablet Take 1 tablet (150 mg total) by mouth at bedtime. 90 tablet 0 04/19/2020 at Unknown time   methylphenidate 54 MG PO CR tablet Take 54 mg by mouth every morning.   Past  Week at Unknown time   Multiple Vitamin (MULTIVITAMIN) capsule Take 1 capsule by mouth daily.   04/19/2020 at Unknown time   Omega-3 Fatty Acids (FISH OIL) 1000 MG CAPS Take 1,000 mg by mouth daily.   04/19/2020 at Unknown time   ALPRAZolam (XANAX) 0.5 MG tablet Take 1 tablet (0.5 mg total) by mouth 2 (two) times daily as needed for anxiety. 60 tablet 0    brexpiprazole (REXULTI) 1 MG TABS tablet Take 1 tablet (1 mg total) by mouth daily for 7 days, THEN 2 tablets (2 mg total) daily. (Patient not taking:  Reported on 04/19/2020) 67 tablet 0    metFORMIN (GLUCOPHAGE) 500 MG tablet Take 1 tablet (500 mg total) by mouth daily with breakfast. 30 tablet 2    methylphenidate (RITALIN LA) 40 MG 24 hr capsule Take 1 capsule (40 mg total) by mouth every morning. (Patient not taking: Reported on 04/19/2020) 30 capsule 0     Musculoskeletal: Strength & Muscle Tone: within normal limits Gait & Station: normal Patient leans: N/A  Psychiatric Specialty Exam: Physical Exam Psychiatric:        Mood and Affect: Mood is anxious.     Review of Systems  Blood pressure 117/75, pulse 88, temperature 97.9 F (36.6 C), temperature source Oral, resp. rate 16, height 5' 11.5" (1.816 m), weight 88.9 kg, SpO2 100 %.Body mass index is 26.96 kg/m.  General Appearance: Casual  Eye Contact:  Good  Speech:  Normal Rate  Volume:  Normal  Mood:  Anxious and Hopeless  Affect:  Appropriate and Congruent  Thought Process:  Goal Directed  Orientation:  Full (Time, Place, and Person)  Thought Content:  Illogical  Suicidal Thoughts:  Yes.  with intent/plan  Homicidal Thoughts:  No  Memory:  Recent;   Fair  Judgement:  Impaired  Insight:  Lacking  Psychomotor Activity:  Normal  Concentration:  Concentration: Fair  Recall:  Poor  Fund of Knowledge:  Fair  Language:  Good  Akathisia:  NA  Handed:  Right  AIMS (if indicated):     Assets:  Desire for Improvement Social Support  ADL's:  Intact  Cognition:  WNL   Sleep:  Number of Hours: 3    Treatment Plan Summary: Daily contact with patient to assess and evaluate symptoms and progress in treatment, Medication management and Plan : Patient is seen and examined.  Patient is a 34 year old male with the above-stated past psychiatric history who was admitted after suicide attempt.  He is involuntary committed  to the hospital.He will be encouraged to attend groups.  We will collect collateral information from her family members.  He is started on Lamictal 150 mg. He endorses auidatory hallucinations, and hopelessness. Also, he has suicide ideation with plan. Review of his admission laboratories revealed high blood glucose. His white blood cell count was slightly elevated at 11.8, but was otherwise normal.  His salicylate was less than 10, acetaminophen was less than 10.   Blood alcohol was less than 10.  Drug screen was negative.    Observation Level/Precautions:  Continuous Observation  Laboratory:   Psychotherapy:    Medications:    Consultations:    Discharge Concerns:    Estimated LOS:  Other:     Physician Treatment Plan for Primary Diagnosis: <principal problem not specified> Long Term Goal(s): Improvement in symptoms so as ready for discharge  Short Term Goals: Ability to identify changes in lifestyle to reduce recurrence of condition will improve, Ability to disclose and discuss suicidal ideas, Ability to identify and develop effective coping behaviors will improve, Ability to maintain clinical measurements within normal limits will improve, Compliance with prescribed medications will improve and Ability to identify triggers associated with substance abuse/mental health issues will improve  Physician Treatment Plan for Secondary Diagnosis: Active Problems:   Bipolar 2 disorder (HCC)   Bipolar disorder (HCC)  Long Term Goal(s): Improvement in symptoms so as ready for discharge  Short Term Goals: Ability to identify changes in lifestyle to  reduce recurrence of condition will improve  I certify that inpatient services furnished can reasonably be expected  to improve the patient's condition.    Arnoldo Lenis, MD 7/1/20212:35 PM

## 2020-04-20 NOTE — Progress Notes (Signed)
D:  Patient's self inventory sheet, patient has fair sleep, no sleep medication.  Fair appetite, low energy level, poor concentration.  Rated depression, hopeless and anxiety #8.  Denied withdrawals.  SI sometimes, almost all the time, contracts for safety.  Denied physical problems.  Denied physical pain.  Goal is do facility daily routine.  Plans to stay alive.  No discharge plans.   A:  Medications administered per MD orders.  Emotional support and encouragement given patient. R:  Denied SI and HI, contracts for safety  Denied A/V hallucinations.  Safety maintained with 15 minute checks.

## 2020-04-20 NOTE — Progress Notes (Signed)
The patient attended group but chose not to share.  

## 2020-04-20 NOTE — BHH Suicide Risk Assessment (Signed)
[facility] Admission Suicide Risk Assessment   Nursing information obtained from:  Patient Demographic factors:  Male, Caucasian, Unemployed Current Mental Status:  Suicidal ideation indicated by patient Loss Factors:  NA Historical Factors:  Prior suicide attempts Risk Reduction Factors:  Positive social support, Positive therapeutic relationship, Living with another person, especially a relative  Total Time spent with patient: 30 minutes Principal Problem: <principal problem not specified> Diagnosis:  Active Problems:   Bipolar 2 disorder (HCC)   Bipolar disorder (HCC)  Subjective Data: Patient is seen and examined.  Patient is a 34 year old male with a reported past psychiatric history significant for anxiety, depression, bipolar disorder, problems with focusing and concentration and suicidal ideation who presented to the [facility] emergency department on 04/19/2020.  The patient presented there via EMS after ingesting [place], Oxy clean, mouthwash and fragrant spray.  The patient refused to discuss the case with me.  He declined to talk to me as a physician, but stated he had spoken with the resident early in the day and that is who he would speak to.  All the information for this dictation comes from the old chart as well as review with the resident.  He admitted that this was a suicide attempt.  He also reportedly took 14 tablets of Nexium.  He stated in the emergency department he was still suicidal and would still try and harm himself.  He did admit that he had some throat pain, but stated he had vomited almost immediately after drinking the liquids.  The comprehensive clinical assessment note from 04/19/2020 also reported that he attempted to electrocute himself with a blow dryer in the bathtub but the safety was on.  He also admitted trying to cut his wrist with a knife.  Nursing notes reflected that he reported he was depressed and hopeless for a long time.  He stated he had been  diagnosed with ADHD, dyslexia, dysgraphia and learning disabilities.  The last grade he completed was either 10th or 11th grade.  He lives with his parents.  He stated that he had been hopeless and suicidal for the last 2 months.  He admitted to having auditory hallucinations and that he hears "chatter".  He told the nursing staff that his most recent suicide attempt was "I was in a bad place and not feeling good about myself".  Review of the electronic medical record revealed multiple visits with Dr. Hinton Dyer.  The last visit was a telemedicine visit on 04/13/2020.  In that note he reported having 1 previous suicide attempt by cutting in August 2019.  He admitted to a history of brief 2 to 3-day episodes with increased energy, irritability, racing thoughts, hyperactivity and decreased need for sleep.  He also endorsed some paranoid fears.  He has previously been treated with Geodon, Rexulti, Abilify, Latuda, Seroquel, Cymbalta, Vraylar, Concerta, Lamictal.  When the resident asked him a question about which medication had been most effective he stated "if it had been effective I would have continue taking it".  He was admitted to the hospital for evaluation and stabilization.  Continued Clinical Symptoms:  Alcohol Use Disorder Identification Test Final Score (AUDIT): 0 The "Alcohol Use Disorders Identification Test", Guidelines for Use in Primary Care, Second Edition.  World Science writer [facility]). Score between 0-7:  no or low risk or alcohol related problems. Score between 8-15:  moderate risk of alcohol related problems. Score between 16-19:  high risk of alcohol related problems. Score 20 or above:  warrants further  diagnostic evaluation for alcohol dependence and treatment.   CLINICAL FACTORS:   Bipolar Disorder:   Mixed State Depression:   Anhedonia Hopelessness Impulsivity Insomnia Personality Disorders:   Cluster B Currently Psychotic Unstable or Poor Therapeutic  Relationship Previous Psychiatric Diagnoses and Treatments   Musculoskeletal: Strength & Muscle Tone: within normal limits Gait & Station: normal Patient leans: N/A  Psychiatric Specialty Exam: Physical Exam Vitals and nursing note reviewed.  Constitutional:      Appearance: Normal appearance.  HENT:     Head: Normocephalic and atraumatic.  Pulmonary:     Effort: Pulmonary effort is normal.  Neurological:     General: No focal deficit present.     Mental Status: He is alert.     Review of Systems  Blood pressure 117/75, pulse 88, temperature 97.9 F (36.6 C), temperature source Oral, resp. rate 16, height 5' 11.5" (1.816 m), weight 88.9 kg, SpO2 100 %.Body mass index is 26.96 kg/m.  General Appearance: Disheveled  Eye Contact:  Fair  Speech:  Normal Rate  Volume:  Normal  Mood:  Irritable  Affect:  Constricted  Thought Process:  Goal Directed and Descriptions of Associations: Circumstantial  Orientation:  Negative  Thought Content:  Hallucinations: Auditory  Suicidal Thoughts:  Yes.  with intent/plan  Homicidal Thoughts:  No  Memory:  Negative  Judgement:  Impaired  Insight:  Lacking  Psychomotor Activity:  Increased  Concentration:  Concentration: Fair and Attention Span: Fair  Recall:  [employer] of Knowledge:  Fair  Language:  Fair  Akathisia:  Negative  Handed:  Right  AIMS (if indicated):     Assets:  Desire for Improvement Housing Resilience  ADL's:  Intact  Cognition:  WNL  Sleep:  Number of Hours: 3      COGNITIVE FEATURES THAT CONTRIBUTE TO RISK:  Closed-mindedness and Thought constriction (tunnel vision)    SUICIDE RISK:   Moderate:  Frequent suicidal ideation with limited intensity, and duration, some specificity in terms of plans, no associated intent, good self-control, limited dysphoria/symptomatology, some risk factors present, and identifiable protective factors, including available and accessible social support.  PLAN OF CARE: Patient  is seen and examined.  Patient is a 34 year old male with the above-stated past psychiatric history who was admitted after an intentional intake of cleaning fluids and Nexium.  He will be admitted to the hospital.  He will be integrated in the milieu.  He will be encouraged to attend groups.  He has been on multiple medications in the past, and at least by his report nothing has really been effective.  We will hold off on the Rexulti for now.  We will continue the Lamictal.  We will contact his mother and father for collateral information for additional history outside of that from Dr. Hinton Dyer.  Besides the medicines mentioned above he has previously been treated with Wellbutrin, Effexor, Cymbalta.  He had also been prescribed Xanax in the past as well as Ritalin.  His suicide attempt in August 2019 was at [facility].  This appears to be the only psychiatric hospitalization that he has had besides this 1.  As stated above we will contact his parents for collateral information and try to establish a relationship with him.  I certify that inpatient services furnished can reasonably be expected to improve the patient's condition.   [name], MD 04/20/2020, 3:20 PM

## 2020-04-20 NOTE — Progress Notes (Signed)
   04/19/20 2335  Psych Admission Type (Psych Patients Only)  Admission Status Involuntary  Psychosocial Assessment  Patient Complaints Depression;Hopelessness  Eye Contact Brief  Facial Expression Anxious  Affect Anxious  Speech Logical/coherent  Interaction Assertive  Motor Activity Other (Comment) (WNL)  Appearance/Hygiene Unremarkable;In scrubs  Behavior Characteristics Cooperative;Anxious  Mood Anxious;Depressed  Thought Process  Coherency WDL  Content WDL  Delusions None reported or observed  Perception WDL  Hallucination Auditory (chatter)  Judgment Poor  Confusion None  Danger to Self  Current suicidal ideation? Active  Self-Injurious Behavior Some self-injurious ideation observed or expressed.  No lethal plan expressed   Agreement Not to Harm Self Yes  Description of Agreement verbal contract to approach staff  Danger to Others  Danger to Others None reported or observed

## 2020-04-20 NOTE — Tx Team (Addendum)
Initial Treatment Plan 04/20/2020 1:49 AM Favian Roque Cash. DHR:[id number]    PATIENT STRESSORS: Other: none identified   PATIENT STRENGTHS: Communication skills Physical Health Supportive family/friends   PATIENT IDENTIFIED PROBLEMS: Suicidal ideation (overdosed on Oxyclean (1.5 cups), Fragrance spray, Nexium (14 tablets), Downy (1.5 cups)  Depression  Hopelessness  (wants to work on life goals, med Insurance account manager and how to feel motivated in life)               DISCHARGE CRITERIA:  Improved stabilization in mood, thinking, and/or behavior Motivation to continue treatment in a less acute level of care Verbal commitment to aftercare and medication compliance  PRELIMINARY DISCHARGE PLAN: Attend aftercare/continuing care group Attend PHP/IOP Outpatient therapy Return to previous living arrangement  PATIENT/FAMILY INVOLVEMENT: This treatment plan has been presented to and reviewed with the patient, Maximino Cozzolino., and/or family member.  The patient and family have been given the opportunity to ask questions and make suggestions.  Victorino December, RN 04/20/2020, 1:49 AM

## 2020-04-20 NOTE — Plan of Care (Signed)
Nurse discussed anxiety, depression and coping skills with patient.  

## 2020-04-20 NOTE — Progress Notes (Signed)
   04/20/20 2040  Psych Admission Type (Psych Patients Only)  Admission Status Involuntary  Psychosocial Assessment  Patient Complaints Anxiety;Depression  Eye Contact Brief  Facial Expression Anxious  Affect Anxious  Speech Logical/coherent  Interaction Assertive  Motor Activity Pacing  Appearance/Hygiene Unremarkable  Behavior Characteristics Cooperative;Anxious  Mood Anxious;Depressed  Thought Process  Coherency WDL  Content WDL  Delusions None reported or observed  Perception Hallucinations  Hallucination Auditory (chatter, off and on)  Judgment Poor  Confusion None  Danger to Self  Current suicidal ideation? Passive  Self-Injurious Behavior Some self-injurious ideation observed or expressed.  No lethal plan expressed   Agreement Not to Harm Self Yes  Description of Agreement verbal contract to approach staff  Danger to Others  Danger to Others None reported or observed   Pt seen pacing the hall. Pt wants his nighttime meds. Pt told that med pass begins at 2100. Pt also asked about Rexulti which he was taking prior to admission but was discontinued today. Pt advised to speak to provider tomorrow and ask them the reason for the discontinuation. "I'm confused because my doctor started me on this and now they are stopping it." Pt rates anxiety 6/10 and depression 9/10 today. Pt endorses passive SI but contracts for safety.

## 2020-04-20 NOTE — Progress Notes (Addendum)
Patient ID: Adam Larsen., male   DOB: 1986/02/15, 34 y.o.   MRN: [id number] D: Pt here IVC from [facility]. Pt overdosed on OxyClean (1.5 cups), Downy (1.5 cups), fragrance spray and Nexium (14 tablets). Pt says he is depressed and hopeless and has been for a long time. Pt states that he has been diagnosed with ADHD, dyslexia, dysgraphia, and learning disabilities. Pt states last grade completed was 10th or 11th grade. Pt lives with his parents who are his social support as well as seeing a psychiatrist. Pt current meds include Lamictal, Methylphenidate, and Rexulti (been taking for about a week).   Pt states that he has been feeling increased hopelessness and suicidal ideation for the last 2 months. Pt endorses suicidal thoughts but contracts for safety. Pt endorses auditory hallucinations saying that he hears "chatter" but he can still focus his thoughts. Began experiencing AH about 2-4 years ago. Pt does not appear to be responding to internal stimuli. Pt states that he has no current stressors.   When speaking about his most recent suicide attempt, "I was in a bad place and not feeling good about myself." Pt's first suicide plan was to hang himself and then he tried to electrocute himself by throwing clippers in water but the clippers didn't have enough charge to do the job so he drank the caustic liquids.   Pt wants to work on feeling motivated to do things with his life and to have life goals. He also wants medication management.  A: Pt was offered support and encouragement. Pt is cooperative during assessment. VS assessed and admission paperwork signed. IVC process explained to pt and all questions answered. Pt had no belongings to place in a locker. Non-invasive skin search completed: no identifiable markings or scars. Pt offered food and drink and both accepted. Pt introduced to unit milieu by nursing staff. Q 15 minute checks were started for safety.   R: Pt in rdayoom eating. Pt safety maintained  on unit.

## 2020-04-21 LAB — GLUCOSE, CAPILLARY: Glucose-Capillary: 97 mg/dL (ref 70–99)

## 2020-04-21 LAB — LIPID PANEL
Cholesterol: 150 mg/dL (ref 0–200)
HDL: 47 mg/dL (ref >40)
LDL Cholesterol: 89 mg/dL (ref 0–99)
Total CHOL/HDL Ratio: 3.2 RATIO
Triglycerides: 71 mg/dL (ref <150)
VLDL: 14 mg/dL (ref 0–40)

## 2020-04-21 LAB — HEMOGLOBIN A1C
Hgb A1c MFr Bld: 5.1 % (ref 4.8–5.6)
Mean Plasma Glucose: 99.67 mg/dL

## 2020-04-21 LAB — TSH: TSH: 2.161 u[IU]/mL (ref 0.350–4.500)

## 2020-04-21 MED ORDER — HALOPERIDOL 2 MG PO TABS
2.0000 mg | ORAL_TABLET | Freq: Two times a day (BID) | ORAL | Status: DC
  Filled 2020-04-21 (×6): qty 1

## 2020-04-21 MED ORDER — BREXPIPRAZOLE 2 MG PO TABS
2.0000 mg | ORAL_TABLET | Freq: Every day | ORAL | Status: DC
  Filled 2020-04-21 (×2): qty 1

## 2020-04-21 MED ORDER — ATOMOXETINE HCL 18 MG PO CAPS
18.0000 mg | ORAL_CAPSULE | Freq: Every day | ORAL | Status: DC
  Administered 2020-04-21 – 2020-04-22 (×2): 18 mg via ORAL
  Filled 2020-04-21 (×5): qty 1

## 2020-04-21 NOTE — Progress Notes (Signed)
   04/21/20 2110  COVID-19 Daily Checkoff  Have you had a fever (temp > 37.80C/100F)  in the past 24 hours?  No  COVID-19 EXPOSURE  Have you traveled outside the state in the past 14 days? No  Have you been in contact with someone with a confirmed diagnosis of COVID-19 or PUI in the past 14 days without wearing appropriate PPE? No  Have you been living in the same home as a person with confirmed diagnosis of COVID-19 or a PUI (household contact)? No  Have you been diagnosed with COVID-19? No

## 2020-04-21 NOTE — Progress Notes (Signed)
[facility] MD Progress Note  04/21/2020 1:58 PM Adam Larsen.  MRN:  [id number] Subjective:  Patient is 34 yo M who was brought to emergency after suicide attempt.He stated that heoverdosed on OxyClean (1.5 cups), Downy (1.5 cups), fragrance spray and Nexium (14tablets). He said that he was depressedand hopeless and has been same for a long time.  Patient is  IVC'd here.He also has a past suicide attempt in 2019.  Objective: Patient is seen and examined. This morning he is asked about how is his mood and day going. He stated he is feeling depressed, hollow, sad, unmotivated and hopeless. He has passive suicidal ideation but no plan. He is persistently asking to continue his old medications. He was explained about his current medications and at end he agreed to take them and adhere to them. He looks anxious and paranoid. He slept around 5 hours but feels ok when got up in the morning. He is eating well.   Principal Problem: <principal problem not specified> Diagnosis: Active Problems:   Bipolar 2 disorder (HCC)   Bipolar disorder (HCC)  Total Time spent with patient: 45 minutes  Past Psychiatric History: Patient is a 34 year old single malewitha long hx of anxiety, depression, problems with focusing, concentration, task completion. He has been treated for depression/anxiety and ADD in the past. He has a hx of having SI and admits to one suicidal attempt by cutting in August 2019. He admits to a hx of brief 2-3 day long episodes on increased energy, irritability, racing thoughts, hyperactivity, decreased need for sleep. He also remembers having at times paranoid fears while in a hypomanic episode. 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 His depressive episodes are typically much longer and caused much more occupational and social dysfunction.HisAbilify was changed to Latudafor bipolar depressionbut he did not respond to this medication. He also tried 50  mg of quetiapine but hecould nottolerate it - sedated, tired the next day.He was thenstarted on Rexulti for augmentation of duloxetine which he tolerated well and reported clear improvement in moodbut has been gradually gaining weight!Also, started methylphenidate xr initially 20 mg daily, then changed toConcertanow at 54 mg strength. He reported much improvement in his focusing abilitybut feels "like crashing in early afternoon).Hestarted to feelvery paranoid and depressedwith rapid mood swings in late November- his mother called the psychiatrist office and they increased dose of Geodon to [facility] at [facility] then to 80 mg. He however could not tolerate it - reported feeling dizzy and having "chest tightness". Then ziprasidone was stopped and started 1.5 mg of cariprazine which he toleratedwell and noticed his mood to beginning to stabilize (he admits to having mildparanoiabut is able to use cognitive skills - knows that fears are unjustified). Even though it has been approved by his insurance, price is prohibitive so he has been getting  samples.His mood has stabilized but he wantedto get off as many medications (cariprazoine, duloxetine)as possible while keeping Concerta. He eventually decided to stay onLamictal. He noticed decline in mood, increased anxiety/paranoia. His father started giving him 2 mg of Rexulti which he had from before.    Past Medical History:  Past Medical History:  Diagnosis Date   Anxiety    Bipolar 1 disorder (HCC)    Depression    Diabetes mellitus without complication (HCC)    Mania (HCC)    Suicidal ideations     Past Surgical History:  Procedure Laterality Date   WISDOM TOOTH  EXTRACTION     Family History:  Family History  Problem Relation Age of Onset   Schizophrenia Paternal Grandmother    Family Psychiatric  History: Schizophrenia in grandmother Social History:  Social History   Substance and Sexual Activity  Alcohol Use Never      Social History   Substance and Sexual Activity  Drug Use Never    Social History   Socioeconomic History   Marital status: Single    Spouse name: Not on file   Number of children: 0   Years of education: 10 or 11   Highest education level: 11th grade  Occupational History   Not on file  Tobacco Use   Smoking status: Current Every Day Smoker    Years: 10.00    Types: E-cigarettes   Smokeless tobacco: Never Used  Building services engineer Use: Every day   Substances: Nicotine, Flavoring, Nicotine-salt  Substance and Sexual Activity   Alcohol use: Never   Drug use: Never   Sexual activity: Not Currently  Other Topics Concern   Not on file  Social History Narrative   Pt lives with his parents. States he has been diagnosed with ADHD, dyslexia, dysgraphia and learning disabilities. Finished 10 th or 11 th grade but is working on his GED through an online high school program. Pt is currently unemployed.   Social Determinants of Health   Financial Resource Strain:    Difficulty of Paying Living Expenses:   Food Insecurity:    Worried About Programme researcher, broadcasting/film/video in the Last Year:    Barista in the Last Year:   Transportation Needs:    Freight forwarder (Medical):    Lack of Transportation (Non-Medical):   Physical Activity:    Days of Exercise per Week:    Minutes of Exercise per Session:   Stress:    Feeling of Stress :   Social Connections:    Frequency of Communication with Friends and Family:    Frequency of Social Gatherings with Friends and Family:    Attends Religious Services:    Active Member of Clubs or Organizations:    Attends Banker Meetings:    Marital Status:    Additional Social History:                         Sleep: Fair  Appetite:  Good  Current Medications: Current Facility-Administered Medications  Medication Dose Route Frequency Provider Last Rate Last Admin   acetaminophen  (TYLENOL) tablet 650 mg  650 mg Oral Q6H PRN [name], [name], NP       alum & mag hydroxide-simeth (MAALOX/MYLANTA) 200-200-20 MG/5ML suspension 30 mL  30 mL Oral Q4H PRN [name], MD       atomoxetine (STRATTERA) capsule 18 mg  18 mg Oral Daily Patience Nuzzo, MD       haloperidol (HALDOL) tablet 2 mg  2 mg Oral BID Tayon Parekh, Geralynn Rile, MD       hydrOXYzine (ATARAX/VISTARIL) tablet 25 mg  25 mg Oral TID PRN [name], MD       lamoTRIgine (LAMICTAL) tablet 150 mg  150 mg Oral QHS [name], MD   150 mg at 04/20/20 2115   magnesium hydroxide (MILK OF MAGNESIA) suspension 30 mL  30 mL Oral Daily PRN [name], MD       nicotine (NICODERM CQ - dosed in mg/24 hours) patch 21 mg  21 mg Transdermal Daily [name], [name], NP   21 mg at 04/21/20 0805   pantoprazole (PROTONIX) EC tablet 40 mg  40 mg Oral Daily Teruko Joswick, MD   40 mg at 04/21/20 0805   traZODone (DESYREL) tablet 50 mg  50 mg Oral QHS PRN Antonieta Pertlary, Greg Lawson, MD        Lab Results:  Results for orders placed or performed during the hospital encounter of 04/19/20 (from the past 48 hour(s))  Glucose, capillary     Status: Abnormal   Collection Time: 04/20/20 12:25 PM  Result Value Ref Range   Glucose-Capillary 114 (H) 70 - 99 mg/dL    Comment: Glucose reference range applies only to samples taken after fasting for at least 8 hours.  Glucose, capillary     Status: None   Collection Time: 04/21/20  5:51 AM  Result Value Ref Range   Glucose-Capillary 97 70 - 99 mg/dL    Comment: Glucose reference range applies only to samples taken after fasting for at least 8 hours.   Comment 1 Notify RN    Comment 2 Document in Chart   Hemoglobin A1c     Status: None   Collection Time: 04/21/20  6:06 AM  Result Value Ref Range   Hgb A1c MFr Bld 5.1 4.8 - 5.6 %    Comment: (NOTE) Pre diabetes:          5.7%-6.4%  Diabetes:              >6.4%  Glycemic control for   <7.0% adults with diabetes    Mean  Plasma Glucose 99.67 mg/dL    Comment: Performed at [facility], [address] [place], [place] [address]  Lipid panel     Status: None   Collection Time: 04/21/20  6:06 AM  Result Value Ref Range   Cholesterol 150 0 - 200 mg/dL   Triglycerides 71 <604<150 mg/dL   HDL 47 >54>40 mg/dL   Total CHOL/HDL Ratio 3.2 RATIO   VLDL 14 0 - 40 mg/dL   LDL Cholesterol 89 0 - 99 mg/dL    Comment:        Total Cholesterol/HDL:CHD Risk Coronary Heart Disease Risk Table                     Men   Women  1/2 Average Risk   3.4   3.3  Average Risk       5.0   4.4  2 X Average Risk   9.6   7.1  3 X Average Risk  23.4   11.0        Use the calculated Patient Ratio above and the CHD Risk Table to determine the patient's CHD Risk.        ATP III CLASSIFICATION (LDL):  <100     mg/dL   Optimal  098-119100-129  mg/dL   Near or Above                    Optimal  130-159  mg/dL   Borderline  [id number]  mg/dL   High  >562>190     mg/dL   Very High Performed at [facility], [address] [place], [place] [address]   TSH     Status: None   Collection Time: 04/21/20  6:06 AM  Result Value Ref Range   TSH 2.161 0.350 - 4.500 uIU/mL    Comment: Performed by a 3rd  Generation assay with a functional sensitivity of <=0.01 uIU/mL. Performed at [facility], [address], [place], [place] [address]     Blood Alcohol level:  Lab Results  Component Value Date   ETH <10 04/19/2020    Metabolic Disorder Labs: Lab Results  Component Value Date   HGBA1C 5.1 04/21/2020   MPG 99.67 04/21/2020   No results found for: PROLACTIN Lab Results  Component Value Date   CHOL 150 04/21/2020   TRIG 71 04/21/2020   HDL 47 04/21/2020   CHOLHDL 3.2 04/21/2020   VLDL 14 04/21/2020   LDLCALC 89 04/21/2020    Physical Findings: AIMS:  , ,  ,  ,    CIWA:    COWS:     Musculoskeletal: Strength & Muscle Tone: within normal limits Gait & Station: normal Patient leans:  N/A  Psychiatric Specialty Exam: Physical Exam  Review of Systems  Blood pressure 123/89, pulse 90, temperature 98.2 F (36.8 C), temperature source Oral, resp. rate 16, height 5' 11.5" (1.816 m), weight 88.9 kg, SpO2 100 %.Body mass index is 26.96 kg/m.  General Appearance: Fairly Groomed  Eye Contact:  Good  Speech:  Normal Rate  Volume:  Normal  Mood:  Anxious, Depressed, Hopeless, Irritable and Worthless  Affect:  Inappropriate  Thought Process:  Disorganized  Orientation:  Full (Time, Place, and Person)  Thought Content:  Illogical  Suicidal Thoughts:  Yes.  without intent/plan  Homicidal Thoughts:  No  Memory:  Recent;   Fair  Judgement:  Impaired  Insight:  Present  Psychomotor Activity:  Normal  Concentration:  Concentration: Fair and Attention Span: Good  Recall:  Poor  Fund of Knowledge:  Fair  Language:  Good  Akathisia:  NA  Handed:  Right  AIMS (if indicated):     Assets:  Communication Skills Desire for Improvement Social Support  ADL's:  Impaired  Cognition:  Impaired,  Mild  Sleep:  Number of Hours: 5.75     Treatment Plan Summary: Daily contact with patient to assess and evaluate symptoms and progress in treatment.  1. Starting Strattera 18 mg PO daily for depression and concentration. 2. Increased Haldol 2 mg BID. 3. Stopped metformin as he is not on rexulti. 4. Disposition planning in progress.   Arnoldo Lenis, MD 04/21/2020, 1:58 PM

## 2020-04-21 NOTE — Progress Notes (Signed)
Adult Psychoeducational Group Note  Date:  04/21/2020 Time:  10:07 PM  Group Topic/Focus:  Wrap-Up Group:   The focus of this group is to help patients review their daily goal of treatment and discuss progress on daily workbooks.  Participation Level:  Active  Participation Quality:  Attentive  Affect:  Appropriate  Cognitive:  Alert  Insight: Good  Engagement in Group:  Developing/Improving  Modes of Intervention:  Education and Support  Additional Comments: Patient participated actively.  Adam  [name] 04/21/2020, 10:07 PM

## 2020-04-21 NOTE — Progress Notes (Signed)
Recreation Therapy Notes  Date:  7.2.21 Time: 0930 Location: [address] Group Room  Group Topic: Stress Management  Goal Area(s) Addresses:  Patient will identify positive stress management techniques. Patient will identify benefits of using stress management post d/c.  Behavioral Response: Engaged  Intervention: Stress Management  Activity: Meditation.  LRT played a meditation that focused on taking on the characteristics of a mountain into your meditation and everyday life.  Patients were to listen and follow along as meditation played to fully engage in activity.    Education:  Stress Management, Discharge Planning.   Education Outcome: Acknowledges Education  Clinical Observations/Feedback: Pt attended and participated in group session.    Caroll Rancher, LRT/CTRS        Lillia Abed, Dayan Kreis A 04/21/2020 10:03 AM

## 2020-04-21 NOTE — Tx Team (Signed)
Interdisciplinary Treatment and Diagnostic Plan Update  04/21/2020 Time of Session: 9:10am Adam Larsen. MRN: [id number]  Principal Diagnosis: <principal problem not specified>  Secondary Diagnoses: Active Problems:   Bipolar 2 disorder (HCC)   Bipolar disorder (HCC)   Current Medications:  Current Facility-Administered Medications  Medication Dose Route Frequency Provider Last Rate Last Admin   acetaminophen (TYLENOL) tablet 650 mg  650 mg Oral Q6H PRN [name], [name], NP       alum & mag hydroxide-simeth (MAALOX/MYLANTA) 200-200-20 MG/5ML suspension 30 mL  30 mL Oral Q4H PRN Sharma Covert, MD       haloperidol (HALDOL) tablet 1 mg  1 mg Oral BID Sharma Covert, MD   1 mg at 04/21/20 0805   hydrOXYzine (ATARAX/VISTARIL) tablet 25 mg  25 mg Oral TID PRN Sharma Covert, MD       lamoTRIgine (LAMICTAL) tablet 150 mg  150 mg Oral QHS Sharma Covert, MD   150 mg at 04/20/20 2115   magnesium hydroxide (MILK OF MAGNESIA) suspension 30 mL  30 mL Oral Daily PRN Sharma Covert, MD       nicotine (NICODERM CQ - dosed in mg/24 hours) patch 21 mg  21 mg Transdermal Daily [name], [name], NP   21 mg at 04/21/20 0805   pantoprazole (PROTONIX) EC tablet 40 mg  40 mg Oral Daily Dagar, Anjali, MD   40 mg at 04/21/20 0805   traZODone (DESYREL) tablet 50 mg  50 mg Oral QHS PRN Sharma Covert, MD       PTA Medications: Medications Prior to Admission  Medication Sig Dispense Refill Last Dose   Ascorbic Acid (VITAMIN C) 1000 MG tablet Take 2,000 mg by mouth daily.   04/19/2020 at Unknown time   brexpiprazole (REXULTI) 1 MG TABS tablet Take 2 mg by mouth daily.   04/19/2020 at Unknown time   cholecalciferol (VITAMIN D3) 25 MCG (1000 UNIT) tablet Take 2,000 Units by mouth daily.   04/19/2020 at Unknown time   lamoTRIgine (LAMICTAL) 150 MG tablet Take 1 tablet (150 mg total) by mouth at bedtime. 90 tablet 0 04/19/2020 at Unknown time   methylphenidate 54 MG PO CR tablet  Take 54 mg by mouth every morning.   Past Week at Unknown time   Multiple Vitamin (MULTIVITAMIN) capsule Take 1 capsule by mouth daily.   04/19/2020 at Unknown time   Omega-3 Fatty Acids (FISH OIL) 1000 MG CAPS Take 1,000 mg by mouth daily.   04/19/2020 at Unknown time   ALPRAZolam (XANAX) 0.5 MG tablet Take 1 tablet (0.5 mg total) by mouth 2 (two) times daily as needed for anxiety. 60 tablet 0    brexpiprazole (REXULTI) 1 MG TABS tablet Take 1 tablet (1 mg total) by mouth daily for 7 days, THEN 2 tablets (2 mg total) daily. (Patient not taking: Reported on 04/19/2020) 67 tablet 0    metFORMIN (GLUCOPHAGE) 500 MG tablet Take 1 tablet (500 mg total) by mouth daily with breakfast. 30 tablet 2    methylphenidate (RITALIN LA) 40 MG 24 hr capsule Take 1 capsule (40 mg total) by mouth every morning. 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   Physician Treatment Plan for Primary Diagnosis: <principal problem not specified> Long Term  Goal(s): Improvement in symptoms so as ready for discharge Improvement in symptoms so as ready for discharge   Short Term Goals: Ability to identify changes in lifestyle to reduce recurrence of condition will improve Ability to disclose and discuss suicidal ideas Ability to identify and develop effective coping behaviors will improve Ability to maintain clinical measurements within normal limits will improve Compliance with prescribed medications will improve Ability to identify triggers associated with substance abuse/mental health issues will improve Ability to identify changes in lifestyle to reduce recurrence of condition will improve  Medication Management: Evaluate  patient's response, side effects, and tolerance of medication regimen.  Therapeutic Interventions: 1 to 1 sessions, Unit Group sessions and Medication administration.  Evaluation of Outcomes: Not Met  Physician Treatment Plan for Secondary Diagnosis: Active Problems:   Bipolar 2 disorder ([place])   Bipolar disorder ([place])  Long Term Goal(s): Improvement in symptoms so as ready for discharge Improvement in symptoms so as ready for discharge   Short Term Goals: Ability to identify changes in lifestyle to reduce recurrence of condition will improve Ability to disclose and discuss suicidal ideas Ability to identify and develop effective coping behaviors will improve Ability to maintain clinical measurements within normal limits will improve Compliance with prescribed medications will improve Ability to identify triggers associated with substance abuse/mental health issues will improve Ability to identify changes in lifestyle to reduce recurrence of condition will improve     Medication Management: Evaluate patient's response, side effects, and tolerance of medication regimen.  Therapeutic Interventions: 1 to 1 sessions, Unit Group sessions and Medication administration.  Evaluation of Outcomes: Not Met   RN Treatment Plan for Primary Diagnosis: <principal problem not specified> Long Term Goal(s): Knowledge of disease and therapeutic regimen to maintain health will improve  Short Term Goals: Ability to participate in decision making will improve, Ability to verbalize feelings will improve, Ability to disclose and discuss suicidal ideas, Ability to identify and develop effective coping behaviors will improve and Compliance with prescribed medications will improve  Medication Management: RN will administer medications as ordered by provider, will assess and evaluate patient's response and provide education to patient for prescribed medication. RN will report any adverse and/or side effects to  prescribing provider.  Therapeutic Interventions: 1 on 1 counseling sessions, Psychoeducation, Medication administration, Evaluate responses to treatment, Monitor vital signs and CBGs as ordered, Perform/monitor CIWA, COWS, AIMS and Fall Risk screenings as ordered, Perform wound care treatments as ordered.  Evaluation of Outcomes: Not Met   LCSW Treatment Plan for Primary Diagnosis: <principal problem not specified> Long Term Goal(s): Safe transition to appropriate next level of care at discharge, Engage patient in therapeutic group addressing interpersonal concerns.  Short Term Goals: Engage patient in aftercare planning with referrals and resources  Therapeutic Interventions: Assess for all discharge needs, 1 to 1 time with Social worker, Explore available resources and support systems, Assess for adequacy in community support network, Educate family and significant other(s) on suicide prevention, Complete Psychosocial Assessment, Interpersonal group therapy.  Evaluation of Outcomes: Not Met   Progress in Treatment: Attending groups: No. Participating in groups: No. Taking medication as prescribed: Yes. Toleration medication: Yes. Family/Significant other contact made: No, will contact:  if patient consents Patient understands diagnosis: Yes. Discussing patient identified problems/goals with staff: Yes. Medical problems stabilized or resolved: Yes. Denies suicidal/homicidal ideation: No. Endorsed passive SI Issues/concerns per patient self-inventory: No. Other:   New problem(s) identified: None   New Short Term/Long Term Goal(s):medication stabilization, elimination of SI thoughts, development  of comprehensive mental wellness plan.    Patient Goals:  "To be on a better track, I want a job, my own place and to feel better"   Discharge Plan or Barriers: Patient recently admitted. CSW will continue to follow and assess for appropriate referrals and possible discharge planning.     Reason for Continuation of Hospitalization: Anxiety Depression Medication stabilization Suicidal ideation  Estimated Length of Stay: 3-5 days   Attendees: Patient: Adam Larsen 04/21/2020 10:48 AM  Physician: Dr. Myles Lipps, MD 04/21/2020 10:48 AM  Nursing:  04/21/2020 10:48 AM  RN Care Manager: 04/21/2020 10:48 AM  Social Worker: Radonna Ricker, LCSW 04/21/2020 10:48 AM  Recreational Therapist:  04/21/2020 10:48 AM  Other:  04/21/2020 10:48 AM  Other:  04/21/2020 10:48 AM  Other: 04/21/2020 10:48 AM    Scribe for Treatment Team: Marylee Floras, [place] 04/21/2020 10:48 AM

## 2020-04-21 NOTE — Progress Notes (Signed)
   04/21/20 0602  Vital Signs  Pulse Rate 90  BP 123/89  BP Location Right Arm  BP Method Automatic  Patient Position (if appropriate) Standing    D: Patient presets with an anxious and depressed affect. Patient rated anxiety 5/10 and depression 7/10. Patient isolated in room. Patient took all am medicines.  Pt. Denies SI/HI/AVH A:  Support and encouragement provided Routine safety checks conducted every 15 minutes. Patient  Informed to notify staff with any concerns.   R:  Safety maintained.

## 2020-04-21 NOTE — Progress Notes (Signed)
   04/21/20 2110  Psych Admission Type (Psych Patients Only)  Admission Status Involuntary  Psychosocial Assessment  Patient Complaints Depression;Sadness  Eye Contact Brief  Facial Expression Anxious  Affect Anxious  Speech Logical/coherent  Interaction Assertive  Motor Activity Other (Comment) (WNL)  Appearance/Hygiene Unremarkable  Behavior Characteristics Cooperative;Appropriate to situation;Anxious  Mood Depressed;Anxious;Sad  Thought Process  Coherency WDL  Content WDL  Delusions None reported or observed  Perception WDL  Hallucination None reported or observed  Judgment Poor  Confusion None  Danger to Self  Current suicidal ideation? Passive  Self-Injurious Behavior Some self-injurious ideation observed or expressed.  No lethal plan expressed   Agreement Not to Harm Self Yes  Description of Agreement verbally contracts for safety  Danger to Others  Danger to Others None reported or observed

## 2020-04-22 MED ORDER — LAMOTRIGINE 200 MG PO TABS
200.0000 mg | ORAL_TABLET | Freq: Every day | ORAL | Status: DC
  Administered 2020-04-22 – 2020-04-29 (×8): 200 mg via ORAL
  Filled 2020-04-22 (×10): qty 1
  Filled 2020-04-22: qty 2

## 2020-04-22 MED ORDER — BREXPIPRAZOLE 2 MG PO TABS
3.0000 mg | ORAL_TABLET | Freq: Every day | ORAL | Status: DC
  Filled 2020-04-22 (×4): qty 1

## 2020-04-22 MED ORDER — ATOMOXETINE HCL 25 MG PO CAPS
25.0000 mg | ORAL_CAPSULE | Freq: Every day | ORAL | Status: DC
  Administered 2020-04-23 – 2020-04-27 (×5): 25 mg via ORAL
  Filled 2020-04-22 (×7): qty 1
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
## 2020-04-22 NOTE — Progress Notes (Signed)
Pt presents with a flat affect and labile mood. He reported feeling increasingly depressed this morning but would not elaborate. He reported having passive SI with no plan or intent. He stated "what can I do in here to hurt myself?" Pt observed to be somewhat irritable today towards staff and while on the telephone this afternoon. He tends to speak with a sarcastic tone.  He appears to have little insight for treatment and refuses to take the Haldol medication as ordered. He stated, "I don't need that medication, my thoughts are clear."  Orders reviewed with the pt. Verbal support provided. 15 minute check performed for safety.   Pt denies any medication side effects.

## 2020-04-22 NOTE — Progress Notes (Signed)
[facility] MD Progress Note  04/22/2020 3:14 PM Adam Larsen.  MRN:  [id number] Subjective: Patient is a 34 year old male with a past psychiatric history significant for anxiety, depression, bipolar disorder, focus and attention who presented to the [facility] emergency department on 04/19/2020 after ingesting multiple cleaning fluids.  Objective: Patient is seen and examined.  Patient is a 34 year old male with the above-stated past psychiatric history who is seen in follow-up.  Unfortunately he declined to see the nurse practitioner this morning, and when I saw him and asked him if he would like to talk he said "I would rather not".  I spoke to his family yesterday.  We discussed his case.  They are frustrated as well.  According to what the parents told me they were on their way to the hospital to have him admitted in the first place, and then he went into the bathroom and drank the solutions.  Upon arrival in the hospital he was 9 and a space to be able to talk with me, and declined it at that time.  He spoke to the psychiatric resident at that time.  He has intermittently refused medications although he tells his parents that he has not.  He has requested to be on Rexulti and his attention deficit medications.  We have so far offered him the Rexulti, but at 1 point he declined that.  We have also stated that he would not receive the stimulants while he was in the hospital.  He is currently not in school, not working.  His family described him playing video games for the majority of the day.  He is followed by the outpatient psychiatry department here and has been on multiple medications.  His mother described that he did best after hospitalization in [place] several years ago and Wellbutrin was one of the medications that he was placed on at that time.  We attempted to give him low-dose Haldol, but he declines any antipsychotic medications.  He has not shown any evidence of harm to himself or  to others.  His vital signs are stable, he is afebrile.  Protonix was added on admission to assist in healing from any erosions that may have taken place from the cleaning solutions.  He has declined that as well.  No new laboratories.  Principal Problem: <principal problem not specified> Diagnosis: Active Problems:   Bipolar 2 disorder (HCC)   Bipolar disorder (HCC)  Total Time spent with patient: 15 minutes  Past Psychiatric History: See admission H&P  Past Medical History:  Past Medical History:  Diagnosis Date  . Anxiety   . Bipolar 1 disorder (HCC)   . Depression   . Diabetes mellitus without complication (HCC)   . Mania (HCC)   . Suicidal ideations     Past Surgical History:  Procedure Laterality Date  . WISDOM TOOTH EXTRACTION     Family History:  Family History  Problem Relation Age of Onset  . Schizophrenia Paternal Grandmother    Family Psychiatric  History: See admission H&P Social History:  Social History   Substance and Sexual Activity  Alcohol Use Never     Social History   Substance and Sexual Activity  Drug Use Never    Social History   Socioeconomic History  . Marital status: Single    Spouse name: Not on file  . Number of children: 0  . Years of education: 10 or 65  . Highest education level: 11th grade  Occupational History  .  Not on file  Tobacco Use  . Smoking status: Current Every Day Smoker    Years: 10.00    Types: E-cigarettes  . Smokeless tobacco: Never Used  Vaping Use  . Vaping Use: Every day  . Substances: Nicotine, Flavoring, Nicotine-salt  Substance and Sexual Activity  . Alcohol use: Never  . Drug use: Never  . Sexual activity: Not Currently  Other Topics Concern  . Not on file  Social History Narrative   Pt lives with his parents. States he has been diagnosed with ADHD, dyslexia, dysgraphia and learning disabilities. Finished 10 th or 11 th grade but is working on his GED through an online high school program. Pt is  currently unemployed.   Social Determinants of Health   Financial Resource Strain:   . Difficulty of Paying Living Expenses:   Food Insecurity:   . Worried About Programme researcher, broadcasting/film/video in the Last Year:   . Barista in the Last Year:   Transportation Needs:   . Freight forwarder (Medical):   Marland Kitchen Lack of Transportation (Non-Medical):   Physical Activity:   . Days of Exercise per Week:   . Minutes of Exercise per Session:   Stress:   . Feeling of Stress :   Social Connections:   . Frequency of Communication with Friends and Family:   . Frequency of Social Gatherings with Friends and Family:   . Attends Religious Services:   . Active Member of Clubs or Organizations:   . Attends Banker Meetings:   Marland Kitchen Marital Status:    Additional Social History:                         Sleep: Good  Appetite:  Good  Current Medications: Current Facility-Administered Medications  Medication Dose Route Frequency Provider Last Rate Last Admin  . acetaminophen (TYLENOL) tablet 650 mg  650 mg Oral Q6H PRN [name], [name], NP      . alum & mag hydroxide-simeth (MAALOX/MYLANTA) 200-200-20 MG/5ML suspension 30 mL  30 mL Oral Q4H PRN [name], MD      . atomoxetine (STRATTERA) capsule 18 mg  18 mg Oral Daily Dagar, Anjali, MD   18 mg at 04/22/20 1950  . haloperidol (HALDOL) tablet 2 mg  2 mg Oral BID Dagar, Geralynn Rile, MD      . hydrOXYzine (ATARAX/VISTARIL) tablet 25 mg  25 mg Oral TID PRN [name], MD      . lamoTRIgine (LAMICTAL) tablet 150 mg  150 mg Oral QHS [name], MD   150 mg at 04/21/20 2105  . magnesium hydroxide (MILK OF MAGNESIA) suspension 30 mL  30 mL Oral Daily PRN [name], MD      . nicotine (NICODERM CQ - dosed in mg/24 hours) patch 21 mg  21 mg Transdermal Daily [name], [name], NP   21 mg at 04/22/20 0813  . pantoprazole (PROTONIX) EC tablet 40 mg  40 mg Oral Daily Dagar, Anjali, MD   40 mg at 04/21/20 0805  .  traZODone (DESYREL) tablet 50 mg  50 mg Oral QHS PRN [name], MD        Lab Results:  Results for orders placed or performed during the hospital encounter of 04/19/20 (from the past 48 hour(s))  Glucose, capillary     Status: None   Collection Time: 04/21/20  5:51 AM  Result Value Ref Range   Glucose-Capillary  97 70 - 99 mg/dL    Comment: Glucose reference range applies only to samples taken after fasting for at least 8 hours.   Comment 1 Notify RN    Comment 2 Document in Chart   Hemoglobin A1c     Status: None   Collection Time: 04/21/20  6:06 AM  Result Value Ref Range   Hgb A1c MFr Bld 5.1 4.8 - 5.6 %    Comment: (NOTE) Pre diabetes:          5.7%-6.4%  Diabetes:              >6.4%  Glycemic control for   <7.0% adults with diabetes    Mean Plasma Glucose 99.67 mg/dL    Comment: Performed at [facility], [address] [place], [place] [address]  Lipid panel     Status: None   Collection Time: 04/21/20  6:06 AM  Result Value Ref Range   Cholesterol 150 0 - 200 mg/dL   Triglycerides 71 <191 mg/dL   HDL 47 >47 mg/dL   Total CHOL/HDL Ratio 3.2 RATIO   VLDL 14 0 - 40 mg/dL   LDL Cholesterol 89 0 - 99 mg/dL    Comment:        Total Cholesterol/HDL:CHD Risk Coronary Heart Disease Risk Table                     Men   Women  1/2 Average Risk   3.4   3.3  Average Risk       5.0   4.4  2 X Average Risk   9.6   7.1  3 X Average Risk  23.4   11.0        Use the calculated Patient Ratio above and the CHD Risk Table to determine the patient's CHD Risk.        ATP III CLASSIFICATION (LDL):  <100     mg/dL   Optimal  829-562  mg/dL   Near or Above                    Optimal  130-159  mg/dL   Borderline  130-865  mg/dL   High  >784     mg/dL   Very High Performed at [facility], [address] [place], [place] [address]   TSH     Status: None   Collection Time: 04/21/20  6:06 AM  Result Value Ref Range   TSH 2.161 0.350 - 4.500  uIU/mL    Comment: Performed by a 3rd Generation assay with a functional sensitivity of <=0.01 uIU/mL. Performed at [facility], [address], [place], [place] [address]     Blood Alcohol level:  Lab Results  Component Value Date   ETH <10 04/19/2020    Metabolic Disorder Labs: Lab Results  Component Value Date   HGBA1C 5.1 04/21/2020   MPG 99.67 04/21/2020   No results found for: PROLACTIN Lab Results  Component Value Date   CHOL 150 04/21/2020   TRIG 71 04/21/2020   HDL 47 04/21/2020   CHOLHDL 3.2 04/21/2020   VLDL 14 04/21/2020   LDLCALC 89 04/21/2020    Physical Findings: AIMS:  , ,  ,  ,    CIWA:    COWS:     Musculoskeletal: Strength & Muscle Tone: within normal limits Gait & Station: normal Patient leans: N/A  Psychiatric Specialty Exam: Physical Exam Vitals and nursing note  reviewed.  Constitutional:      Appearance: Normal appearance.  HENT:     Head: Normocephalic and atraumatic.  Pulmonary:     Effort: Pulmonary effort is normal.  Neurological:     General: No focal deficit present.     Mental Status: He is alert.     Review of Systems  Blood pressure 119/87, pulse 84, temperature 98.2 F (36.8 C), temperature source Oral, resp. rate 16, height 5' 11.5" (1.816 m), weight 88.9 kg, SpO2 100 %.Body mass index is 26.96 kg/m.  General Appearance: Casual  Eye Contact:  None  Speech:  Normal Rate  Volume:  Decreased  Mood:  Dysphoric  Affect:  Congruent  Thought Process:  Coherent and Descriptions of Associations: Circumstantial  Orientation:  Negative  Thought Content:  Illogical  Suicidal Thoughts:  Nursing notes reflect passive suicidal ideation  Homicidal Thoughts:  No  Memory:  Negative  Judgement:  Impaired  Insight:  Lacking  Psychomotor Activity:  Normal  Concentration:  Concentration: Fair and Attention Span: Fair  Recall:  Negative  Fund of Knowledge:  Negative  Language:  Fair  Akathisia:  Negative   Handed:  Right  AIMS (if indicated):     Assets:  Desire for Improvement  ADL's:  Intact  Cognition:  WNL  Sleep:  Number of Hours: 6     Treatment Plan Summary: Daily contact with patient to assess and evaluate symptoms and progress in treatment, Medication management and Plan : Patient is seen and examined.  Patient is a 34 year old male with the above-stated past psychiatric history who is seen in follow-up.   Diagnosis: 1.  Reported history of bipolar disorder type II 2.  Unspecified depression 3.  Reported attention deficit disorder 4.  Reported auditory hallucinations  Pertinent findings on examination today: 1.  Patient again has refused examination today.  All information used in the assessment today is from nursing notes and previous progress notes.  Plan: 1.  Stop Haldol 2.  We will attempt to get him to take the Rexulti.  This is for the auditory hallucinations and have been reported.  We are doing that despite the fact that he is told his psychiatrist and our team as well on admission that it was not helping. 3.  Stop Protonix 4.  Increase Lamictal to 200 mg p.o. nightly for mood stability. 5.  Increase Strattera to 36 mg p.o. daily 6.  Continue trazodone 50 mg p.o. nightly as needed insomnia. 7.  Continue hydroxyzine 25 mg p.o. 3 times daily as needed anxiety or itching. 8.  Continue to attempt to engage patient in treatment for his improvement. 9.  Disposition planning-in progress.  Antonieta PertGreg Lawson Annia Gomm, MD 04/22/2020, 3:14 PM

## 2020-04-22 NOTE — Progress Notes (Signed)
   04/22/20 2325  COVID-19 Daily Checkoff  Have you had a fever (temp > 37.80C/100F)  in the past 24 hours?  No  If you have had runny nose, nasal congestion, sneezing in the past 24 hours, has it worsened? No  COVID-19 EXPOSURE  Have you traveled outside the state in the past 14 days? No  Have you been in contact with someone with a confirmed diagnosis of COVID-19 or PUI in the past 14 days without wearing appropriate PPE? No  Have you been living in the same home as a person with confirmed diagnosis of COVID-19 or a PUI (household contact)? No  Have you been diagnosed with COVID-19? No

## 2020-04-22 NOTE — B[facility] Group Notes (Signed)
Pt did not attend wrap up group this evening. Pt was in their room.  

## 2020-04-22 NOTE — Progress Notes (Signed)
°   04/22/20 2327  Psych Admission Type (Psych Patients Only)  Admission Status Involuntary  Psychosocial Assessment  Patient Complaints Depression;Irritability  Eye Contact Brief  Facial Expression Flat  Affect Irritable;Depressed  Speech Logical/coherent  Interaction Minimal;Guarded;Forwards little  Motor Activity Other (Comment) (WDL)  Appearance/Hygiene Unremarkable  Behavior Characteristics Guarded  Mood Irritable  Thought Process  Coherency WDL  Content Paranoia  Delusions Paranoid  Perception WDL  Hallucination None reported or observed  Judgment Poor  Confusion None  Danger to Self  Current suicidal ideation? Passive  Self-Injurious Behavior No self-injurious ideation or behavior indicators observed or expressed   Agreement Not to Harm Self Yes  Description of Agreement verbally contracts for safety  Danger to Others  Danger to Others None reported or observed

## 2020-04-23 MED ORDER — CHLORPROMAZINE HCL 25 MG PO TABS
25.0000 mg | ORAL_TABLET | Freq: Three times a day (TID) | ORAL | Status: DC
  Administered 2020-04-23 – 2020-04-24 (×4): 25 mg via ORAL
  Filled 2020-04-23 (×9): qty 1

## 2020-04-23 NOTE — Progress Notes (Signed)
Pt took the thorazine.

## 2020-04-23 NOTE — Progress Notes (Signed)
   04/23/20 2220  COVID-19 Daily Checkoff  Have you had a fever (temp > 37.80C/100F)  in the past 24 hours?  No  If you have had runny nose, nasal congestion, sneezing in the past 24 hours, has it worsened? No  COVID-19 EXPOSURE  Have you traveled outside the state in the past 14 days? No  Have you been in contact with someone with a confirmed diagnosis of COVID-19 or PUI in the past 14 days without wearing appropriate PPE? No  Have you been living in the same home as a person with confirmed diagnosis of COVID-19 or a PUI (household contact)? No  Have you been diagnosed with COVID-19? No

## 2020-04-23 NOTE — Progress Notes (Signed)
[facility] MD Progress Note  04/23/2020 12:22 PM Adam Larsen.  MRN:  [id number] Subjective:  Patient is a 34 year old male with a past psychiatric history significant for anxiety, depression, bipolar disorder, ADHD who presented to the [facility] emergency department on 04/19/2020 after ingesting multiple caustic substances.  Objective: Patient is seen and examined.  Patient is a 34 year old male with the above-stated past psychiatric history who is seen in follow-up. Today he is asked about his mood and details about his refusal for his medications.He stated Wilhemena Durie is working well for him and he will continue to take it. His mood is better but he still has suicidal ideations with a plan to grab pencil from nurses station. He also stated he could not sleep because his room mate snores loudly and 15 mins check with flashlight at night. He is adamant about not taking Protonix, Rixulti and Haldol. Dr. Jola Babinski already stopped them yesterday but he demanded everything needs to be discussed with him before starting or stopping. He has not shown any evidence of harm to himself or to others.  His vital signs are stable, he is afebrile. No new laboratories. Principal Problem: <principal problem not specified> Diagnosis: Active Problems:   Bipolar 2 disorder (HCC)   Bipolar disorder (HCC)  Total Time spent with patient: 30 minutes  Past Psychiatric History: Patient is a33 year oldsingle malewitha long hx of anxiety, depression, problems with focusing, concentration, task completion. He has been treated for depression/anxiety and ADD in the past.He has a hx of having SI and admits to one suicidal attempt by cutting in August 2019. He admits to a hx of brief 2-3 day long episodes on increased energy, irritability, racing thoughts, hyperactivity, decreased need for sleep. He also remembers having at times paranoid fears while in a hypomanic episode. 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 His depressive episodes are typically much longer and caused much more occupational and social dysfunction.HisAbilifywas changedto Latudafor bipolar depressionbut he did not respond to this medication.He alsotried 50 mg of quetiapine but hecould nottolerate it - sedated, tired the next day.He was thenstartedonRexulti for augmentation of duloxetine which he tolerated well and reported clear improvement in moodbut has been gradually gaining weight!Also,started methylphenidate xr initially 20 mg daily, then changed toConcertanow at 54 mg strength.Hereportedmuch improvement in his focusing abilitybut feels "like crashing in early afternoon).Hestarted to feelvery paranoid and depressedwith rapid mood swings in late November- his mother called thepsychiatristoffice andtheyincreased dose of Geodon to 60 mg at [facility] then to 80 mg. He however could not tolerate it - reported feeling dizzy and having "chest tightness".Thenziprasidone was stoppedand started 1.5 mg of cariprazine which he toleratedwell and noticed his mood to beginning to stabilize (he admits to having mildparanoiabut is able to use cognitive skills - knows that fears are unjustified). Even though it has been approved by his insurance,price is prohibitive so he has been gettingsamples.His mood has stabilized but he wantedto get off as many medications (cariprazoine, duloxetine)as possible while keeping Concerta. He eventually decided to stay onLamictal.He noticed decline in mood, increased anxiety/paranoia. His father started giving him 2 mg of Rexulti which he had from before.  Past Medical History:  Past Medical History:  Diagnosis Date   Anxiety    Bipolar 1 disorder (HCC)    Depression    Diabetes mellitus without complication (HCC)    Mania (HCC)    Suicidal ideations     Past Surgical History:  Procedure  Laterality Date   WISDOM TOOTH EXTRACTION      Family History:  Family History  Problem Relation Age of Onset   Schizophrenia Paternal Grandmother    Family Psychiatric  History: Schizophrenia in grandmother. Social History:  Social History   Substance and Sexual Activity  Alcohol Use Never     Social History   Substance and Sexual Activity  Drug Use Never    Social History   Socioeconomic History   Marital status: Single    Spouse name: Not on file   Number of children: 0   Years of education: 10 or 11   Highest education level: 11th grade  Occupational History   Not on file  Tobacco Use   Smoking status: Current Every Day Smoker    Years: 10.00    Types: E-cigarettes   Smokeless tobacco: Never Used  Building services engineer Use: Every day   Substances: Nicotine, Flavoring, Nicotine-salt  Substance and Sexual Activity   Alcohol use: Never   Drug use: Never   Sexual activity: Not Currently  Other Topics Concern   Not on file  Social History Narrative   Pt lives with his parents. States he has been diagnosed with ADHD, dyslexia, dysgraphia and learning disabilities. Finished 10 th or 11 th grade but is working on his GED through an online high school program. Pt is currently unemployed.   Social Determinants of Health   Financial Resource Strain:    Difficulty of Paying Living Expenses:   Food Insecurity:    Worried About Programme researcher, broadcasting/film/video in the Last Year:    Barista in the Last Year:   Transportation Needs:    Freight forwarder (Medical):    Lack of Transportation (Non-Medical):   Physical Activity:    Days of Exercise per Week:    Minutes of Exercise per Session:   Stress:    Feeling of Stress :   Social Connections:    Frequency of Communication with Friends and Family:    Frequency of Social Gatherings with Friends and Family:    Attends Religious Services:    Active Member of Clubs or Organizations:    Attends Banker Meetings:    Marital  Status:    Additional Social History:                         Sleep: Fair  Appetite:  Good  Current Medications: Current Facility-Administered Medications  Medication Dose Route Frequency Provider Last Rate Last Admin   acetaminophen (TYLENOL) tablet 650 mg  650 mg Oral Q6H PRN [name], [name], NP       alum & mag hydroxide-simeth (MAALOX/MYLANTA) 200-200-20 MG/5ML suspension 30 mL  30 mL Oral Q4H PRN [name], MD       atomoxetine (STRATTERA) capsule 25 mg  25 mg Oral Daily [name], MD   25 mg at 04/23/20 0756   hydrOXYzine (ATARAX/VISTARIL) tablet 25 mg  25 mg Oral TID PRN [name], MD       lamoTRIgine (LAMICTAL) tablet 200 mg  200 mg Oral QHS [name], MD   200 mg at 04/22/20 2103   magnesium hydroxide (MILK OF MAGNESIA) suspension 30 mL  30 mL Oral Daily PRN [name], MD       nicotine (NICODERM CQ - dosed in mg/24 hours) patch 21 mg  21 mg Transdermal Daily [name], [name], NP  21 mg at 04/23/20 0751   traZODone (DESYREL) tablet 50 mg  50 mg Oral QHS PRN [name], MD        Lab Results: No results found for this or any previous visit (from the past 48 hour(s)).  Blood Alcohol level:  Lab Results  Component Value Date   ETH <10 04/19/2020    Metabolic Disorder Labs: Lab Results  Component Value Date   HGBA1C 5.1 04/21/2020   MPG 99.67 04/21/2020   No results found for: PROLACTIN Lab Results  Component Value Date   CHOL 150 04/21/2020   TRIG 71 04/21/2020   HDL 47 04/21/2020   CHOLHDL 3.2 04/21/2020   VLDL 14 04/21/2020   LDLCALC 89 04/21/2020    Physical Findings: AIMS: Facial and Oral Movements Muscles of Facial Expression: None, normal Lips and Perioral Area: None, normal Jaw: None, normal Tongue: None, normal,Extremity Movements Upper (arms, wrists, hands, fingers): None, normal Lower (legs, knees, ankles, toes): None, normal, Trunk Movements Neck, shoulders, hips: None,  normal, Overall Severity Severity of abnormal movements (highest score from questions above): None, normal Incapacitation due to abnormal movements: None, normal Patient's awareness of abnormal movements (rate only patient's report): No Awareness, Dental Status Current problems with teeth and/or dentures?: No Does patient usually wear dentures?: No  CIWA:  CIWA-Ar Total: 7 COWS:     Musculoskeletal: Strength & Muscle Tone: within normal limits Gait & Station: normal Patient leans: N/A  Psychiatric Specialty Exam: Physical Exam  Review of Systems  Blood pressure 134/89, pulse 78, temperature 98.2 F (36.8 C), temperature source Oral, resp. rate 16, height 5' 11.5" (1.816 m), weight 88.9 kg, SpO2 100 %.Body mass index is 26.96 kg/m.  General Appearance: Neat  Eye Contact:  Good  Speech:  Clear and Coherent  Volume:  Normal  Mood:  Fine  Affect:  Normal  Thought Process:  Disorganized and Irrelevant  Orientation:  Full (Time, Place, and Person)  Thought Content:  Illogical  Suicidal Thoughts:  Yes.  with intent/plan  Homicidal Thoughts:  No  Memory:  Immediate;   Fair  Judgement:  Impaired  Insight:  Lacking  Psychomotor Activity:  NA  Concentration:  Concentration: Good  Recall:  Fair  Fund of Knowledge:  Fair  Language:  Good  Akathisia:  Negative  Handed:  Right  AIMS (if indicated):     Assets:  Communication Skills Social Support  ADL's:  Intact  Cognition:  Impaired,  Mild  Sleep:  Number of Hours: 5.75     Treatment Plan Summary: Daily contact with patient to assess and evaluate symptoms and progress in treatment.  Diagnosis;  1. Bipolar Type 2 2. Generalized anxiety disorder 3. Attention deficit Hyperactivity Disorder 4. Auditory hallucinations chatters  Plan:  1. Continue Strattera 25 mg PO daily. 2. Start Thorazine 25 mg TID for mood stabilization as he has refused Rixulti and Haldol. 3. Continue Lamictal 200 mg Po daily at bedtime 3. Continue  to attempt to engage patient in treatment for his improvement. 4.  Disposition planning-in progress.   Arnoldo Lenis, MD 04/23/2020, 12:22 PM

## 2020-04-23 NOTE — Progress Notes (Signed)
Pt presents with a flat affect and labile mood. He appeared irritable during the assessment and continues to refuse treatment. This morning he stated "I want to get my shit right." He endorses SI with no plan or intent. He continues to state "what can I do in here to hurt myself?." He has been observed on the telephone cursing and verbally aggressive.   Orders reviewed with the pt. Verbal support provided. 15 minute check performed for safety.   Pt denies any medication side effects.

## 2020-04-23 NOTE — Progress Notes (Signed)
   04/23/20 2222  Psych Admission Type (Psych Patients Only)  Admission Status Involuntary  Psychosocial Assessment  Patient Complaints Depression  Eye Contact Brief  Facial Expression Flat  Affect Labile  Speech Logical/coherent  Interaction Minimal;Forwards little;Guarded  Motor Activity Other (Comment) (WDL)  Appearance/Hygiene Unremarkable  Behavior Characteristics Appropriate to situation  Mood Depressed;Irritable  Thought Process  Coherency WDL  Content Paranoia  Delusions Paranoid  Perception WDL  Hallucination None reported or observed  Judgment Poor  Confusion None  Danger to Self  Current suicidal ideation? Passive  Self-Injurious Behavior No self-injurious ideation or behavior indicators observed or expressed   Agreement Not to Harm Self Yes  Description of Agreement verbally contracts for safety  Danger to Others  Danger to Others None reported or observed

## 2020-04-23 NOTE — Progress Notes (Signed)
Patient took strattera 25 mg this morning.  Patient refused rexulti 3 mg because his psychiatrist told him not to take rexulti Dr. Candy Sledge that works here in this building on this hallway told him not to take this rexulti because it does not work.  There is a bunch of other stuff on his medicine list that does not work.  We discussed this in person and she recommended that he does not take resulti.

## 2020-04-24 MED ORDER — CHLORPROMAZINE HCL 10 MG PO TABS
10.0000 mg | ORAL_TABLET | Freq: Two times a day (BID) | ORAL | Status: DC
  Administered 2020-04-25: 10 mg via ORAL
  Filled 2020-04-24 (×4): qty 1

## 2020-04-24 MED ORDER — CHLORPROMAZINE HCL 25 MG PO TABS
25.0000 mg | ORAL_TABLET | Freq: Two times a day (BID) | ORAL | Status: DC
  Filled 2020-04-24 (×4): qty 1

## 2020-04-24 NOTE — Progress Notes (Signed)
Pt did attend the evening wrap up group. Positive change and positive thinking were discussed.  Pt did not participate but was attentive. When called upon, pt stated, skip.

## 2020-04-24 NOTE — Progress Notes (Signed)
Patient again refused thorazine medicine after talking to MD.  Then patient called family and nurse overheard him stated he was going to be discharged.

## 2020-04-24 NOTE — Progress Notes (Signed)
BH MD Progress Note  04/24/2020 10:34 AM Adam Larsen.  MRN:  [id number] Subjective:  Patient is a 34 year old male with a past psychiatric history significant for anxiety, depression, bipolar disorder, ADHD who presented to the [facility] emergency department on 04/19/2020 after ingesting multiple caustic substances. Objective: Patient is seen and examined. Patient is a 34 year old male with the above-stated past psychiatric history who is seen in follow-up. He refused Thorazine this morning. He was asked about his mood and details about his refusal for his medications. He stated his mood is getting little better. He slept well last night but felt groggy for about an hour this morning. He stated he refused his medications because he read on medication saying chlorpromazine and didn't realize it's same as Thorazine. After explaining him about the medication he took it. He stated Strattera and Lamictal are working well for him and he will continue to take it. He still has suicidal ideations but no plan today.   He is difficult to engage.He is always very adamant about explaining all the medications, situations & taking his permission before making any decision.He has trust issues.Today when asked abut the chatter he hears- he explained them and that he does not hear/see anything, its just a feeling like fifth sense on dog about something bad is about to happen. He has not shown any evidence of harm to himself or to others. His vital signs are stable, he is afebrile.No new laboratories.   Principal Problem: <principal problem not specified> Diagnosis: Active Problems:   Bipolar 2 disorder (HCC)   Bipolar disorder (HCC)  Total Time spent with patient: 30 minutes  Past Psychiatric History:  Patient is a33 year oldsingle malewitha long hx of anxiety, depression, problems with focusing, concentration, task completion. He has been treated for depression/anxiety and ADD in the  past.He has a hx of having SI and admits to one suicidal attempt by cutting in August 2019. He admits to a hx of brief 2-3 day long episodes on increased energy, irritability, racing thoughts, hyperactivity, decreased need for sleep. He also remembers having at times paranoid fears while in a hypomanic episode. 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 His depressive episodes are typically much longer and caused much more occupational and social dysfunction.HisAbilifywas changedto Latudafor bipolar depressionbut he did not respond to this medication.He alsotried 50 mg of quetiapine but hecould nottolerate it - sedated, tired the next day.He was thenstartedonRexulti for augmentation of duloxetine which he tolerated well and reported clear improvement in moodbut has been gradually gaining weight!Also,started methylphenidate xr initially 20 mg daily, then changed toConcertanow at 54 mg strength.Hereportedmuch improvement in his focusing abilitybut feels "like crashing in early afternoon).Hestarted to feelvery paranoid and depressedwith rapid mood swings in late November- his mother called thepsychiatristoffice andtheyincreased dose of Geodon to 60 mg at [facility] then to 80 mg. He however could not tolerate it - reported feeling dizzy and having "chest tightness".Thenziprasidone was stoppedand started 1.5 mg of cariprazine which he toleratedwell and noticed his mood to beginning to stabilize (he admits to having mildparanoiabut is able to use cognitive skills - knows that fears are unjustified). Even though it has been approved by his insurance,price is prohibitive so he has been gettingsamples.His mood has stabilized but he wantedto get off as many medications (cariprazoine, duloxetine)as possible while keeping Concerta. He eventually decided to stay onLamictal.He noticed decline in mood, increased anxiety/paranoia. His father  started giving him 2 mg of  Rexulti which he had from before.  Past Medical History:  Past Medical History:  Diagnosis Date   Anxiety    Bipolar 1 disorder (HCC)    Depression    Diabetes mellitus without complication (HCC)    Mania (HCC)    Suicidal ideations     Past Surgical History:  Procedure Laterality Date   WISDOM TOOTH EXTRACTION     Family History:  Family History  Problem Relation Age of Onset   Schizophrenia Paternal Grandmother    Family Psychiatric  History: Schizophrenia in Paternal Grandmother   Social History:  Social History   Substance and Sexual Activity  Alcohol Use Never     Social History   Substance and Sexual Activity  Drug Use Never    Social History   Socioeconomic History   Marital status: Single    Spouse name: Not on file   Number of children: 0   Years of education: 10 or 11   Highest education level: 11th grade  Occupational History   Not on file  Tobacco Use   Smoking status: Current Every Day Smoker    Years: 10.00    Types: E-cigarettes   Smokeless tobacco: Never Used  Building services engineer Use: Every day   Substances: Nicotine, Flavoring, Nicotine-salt  Substance and Sexual Activity   Alcohol use: Never   Drug use: Never   Sexual activity: Not Currently  Other Topics Concern   Not on file  Social History Narrative   Pt lives with his parents. States he has been diagnosed with ADHD, dyslexia, dysgraphia and learning disabilities. Finished 10 th or 11 th grade but is working on his GED through an online high school program. Pt is currently unemployed.   Social Determinants of Health   Financial Resource Strain:    Difficulty of Paying Living Expenses:   Food Insecurity:    Worried About Programme researcher, broadcasting/film/video in the Last Year:    Barista in the Last Year:   Transportation Needs:    Freight forwarder (Medical):    Lack of Transportation (Non-Medical):   Physical Activity:     Days of Exercise per Week:    Minutes of Exercise per Session:   Stress:    Feeling of Stress :   Social Connections:    Frequency of Communication with Friends and Family:    Frequency of Social Gatherings with Friends and Family:    Attends Religious Services:    Active Member of Clubs or Organizations:    Attends Banker Meetings:    Marital Status:    Additional Social History:                         Sleep: Good  Appetite:  Good  Current Medications: Current Facility-Administered Medications  Medication Dose Route Frequency Provider Last Rate Last Admin   acetaminophen (TYLENOL) tablet 650 mg  650 mg Oral Q6H PRN [name], [name], NP       alum & mag hydroxide-simeth (MAALOX/MYLANTA) 200-200-20 MG/5ML suspension 30 mL  30 mL Oral Q4H PRN [name], MD       atomoxetine (STRATTERA) capsule 25 mg  25 mg Oral Daily [name], MD   25 mg at 04/24/20 0754   chlorproMAZINE (THORAZINE) tablet 25 mg  25 mg Oral TID Arnoldo Lenis, MD   25 mg at 04/24/20 1017   hydrOXYzine (ATARAX/VISTARIL) tablet 25 mg  25  mg Oral TID PRN [name], MD       lamoTRIgine (LAMICTAL) tablet 200 mg  200 mg Oral QHS [name], MD   200 mg at 04/23/20 2106   magnesium hydroxide (MILK OF MAGNESIA) suspension 30 mL  30 mL Oral Daily PRN [name], MD       nicotine (NICODERM CQ - dosed in mg/24 hours) patch 21 mg  21 mg Transdermal Daily [name], [name], NP   21 mg at 04/24/20 0753   traZODone (DESYREL) tablet 50 mg  50 mg Oral QHS PRN [name], MD        Lab Results: No results found for this or any previous visit (from the past 48 hour(s)).  Blood Alcohol level:  Lab Results  Component Value Date   ETH <10 04/19/2020    Metabolic Disorder Labs: Lab Results  Component Value Date   HGBA1C 5.1 04/21/2020   MPG 99.67 04/21/2020   No results found for: PROLACTIN Lab Results  Component Value Date   CHOL 150  04/21/2020   TRIG 71 04/21/2020   HDL 47 04/21/2020   CHOLHDL 3.2 04/21/2020   VLDL 14 04/21/2020   LDLCALC 89 04/21/2020    Physical Findings: AIMS: Facial and Oral Movements Muscles of Facial Expression: None, normal Lips and Perioral Area: None, normal Jaw: None, normal Tongue: None, normal,Extremity Movements Upper (arms, wrists, hands, fingers): None, normal Lower (legs, knees, ankles, toes): None, normal, Trunk Movements Neck, shoulders, hips: None, normal, Overall Severity Severity of abnormal movements (highest score from questions above): None, normal Incapacitation due to abnormal movements: None, normal Patient's awareness of abnormal movements (rate only patient's report): No Awareness, Dental Status Current problems with teeth and/or dentures?: No Does patient usually wear dentures?: No  CIWA:  CIWA-Ar Total: 1 COWS:     Musculoskeletal: Strength & Muscle Tone: within normal limits Gait & Station: normal Patient leans: N/A  Psychiatric Specialty Exam: Physical Exam  Review of Systems  Blood pressure 107/71, pulse 87, temperature 99 F (37.2 C), temperature source Oral, resp. rate 16, height 5' 11.5" (1.816 m), weight 88.9 kg, SpO2 99 %.Body mass index is 26.96 kg/m.  General Appearance: Neat  Eye Contact:  Fair  Speech:  Normal Rate  Volume:  Normal  Mood:  Better than yesterday  Affect:  Labile  Thought Process:  Disorganized  Orientation:  Full (Time, Place, and Person)  Thought Content:  Logical  Suicidal Thoughts:  Yes.  without intent/plan  Homicidal Thoughts:  No  Memory:  Immediate;   Fair  Judgement:  Fair  Insight:  Lacking  Psychomotor Activity:  NA  Concentration:  Concentration: Good  Recall:  Fair  Fund of Knowledge:  Fair  Language:  Good  Akathisia:  NA  Handed:  Right  AIMS (if indicated):     Assets:  Communication Skills Desire for Improvement Social Support  ADL's:  Intact  Cognition:  WNL  Sleep:  Number of Hours: 6.25      Treatment Plan Summary: Daily contact with patient to assess and evaluate symptoms and progress in treatment .  Diagnosis:  1. Bipolar Type 2 2. Generalized anxiety disorder 3. Attention deficit Hyperactivity Disorder 4. Auditory hallucinations chatters  Plan:  1. Continue Strattera 25 mg PO daily. 2. Continue Thorazine 25 mg TID for mood stabilization. 3. Continue Lamictal 200 mg PO daily at bedtime 3. Continue to attempt to engage patient in treatment for his improvement. 4. Disposition planning-in progress.  Arnoldo LenisAnjali  Timathy Newberry, MD 04/24/2020, 10:34 AM

## 2020-04-24 NOTE — Progress Notes (Addendum)
Pt refused his Thorazine this evening , informed pt it was to help his mood and to help with AH.     04/24/20 2000  Psych Admission Type (Psych Patients Only)  Admission Status Involuntary  Psychosocial Assessment  Patient Complaints Anxiety  Eye Contact Brief  Facial Expression Flat  Affect Depressed;Irritable;Labile  Speech Logical/coherent  Interaction Minimal;Forwards little;Guarded  Motor Activity Other (Comment) (WDL)  Appearance/Hygiene Unremarkable  Behavior Characteristics Agitated  Mood Depressed  Thought Process  Coherency WDL  Content Paranoia  Delusions Paranoid  Perception WDL  Hallucination None reported or observed  Judgment Poor  Confusion None  Danger to Self  Current suicidal ideation? Passive  Self-Injurious Behavior Some self-injurious ideation observed or expressed.  No lethal plan expressed   Agreement Not to Harm Self Yes  Description of Agreement verbally contracts for safety  Danger to Others  Danger to Others None reported or observed

## 2020-04-24 NOTE — Progress Notes (Signed)
Patient refused thorazine this morning.  Stated MD told him she took it off med list.  Patient stated he is suicidal, did not want to discuss.  After discharge, will be suicidal.  Will contract for safety.  Denied HI.  Denied A/V hallucinations.  Stated he will take something for his brain.

## 2020-04-24 NOTE — B[facility] Group Notes (Signed)
Pt did not attend wrap up group this evening. Pt was in their room.  

## 2020-04-24 NOTE — Progress Notes (Signed)
Recreation Therapy Notes  Date:  7.5.21 Time: 0930 Location: [address] Hall Dayroom  Group Topic: Stress Management  Goal Area(s) Addresses:  Patient will identify positive stress management techniques. Patient will identify benefits of using stress management post d/c.  Intervention: Stress Management  Activity: Meditation.  LRT played a meditation that focused on being resilient in the face of change.  Patients were to listen and follow along as meditation played in order to engage in activity.    Education:  Stress Management, Discharge Planning.   Education Outcome: Acknowledges Education  Clinical Observations/Feedback: Pt did not attend group activity.    Caroll Rancher, LRT/CTRS         Lillia Abed, Cerissa Zeiger A 04/24/2020 11:15 AM

## 2020-04-24 NOTE — Progress Notes (Signed)
D:  Patient has refused thorazine twice this morning.  MD stood with RN in the med window and patient agreed to take thorazine 25 mg p.o. later in the morning.  Patient stated he was suicidal, did not want to discuss, after discharge he thought he would be suicidal.  Contracts for safety verbally this morning.  Denied HI.  Denied A/V hallucinations. A:  Patient took strattera without questions this morning.  Refused thorazine twice.  Emotional support and encouragement given patient. R;   Safety maintained with 15 minute checks.

## 2020-04-25 ENCOUNTER — Telehealth (HOSPITAL_COMMUNITY): Payer: Self-pay | Admitting: *Deleted

## 2020-04-25 MED ORDER — CHLORPROMAZINE HCL 25 MG PO TABS
25.0000 mg | ORAL_TABLET | Freq: Two times a day (BID) | ORAL | Status: DC
  Administered 2020-04-25 – 2020-04-30 (×10): 25 mg via ORAL
  Filled 2020-04-25 (×14): qty 1

## 2020-04-25 NOTE — Telephone Encounter (Signed)
FYI pt mother called requesting that you speak with pt, he's inpt [facility], as he does not trust or like his doctor there. Pt is paranoid mother admits. I can call mother and explain process per this nurses 25 years of inpt practice. Please review and advise.

## 2020-04-25 NOTE — Progress Notes (Signed)
Psychoeducational Group Note  Date:  04/25/2020 Time:  2044  Group Topic/Focus:  Wrap-Up Group:   The focus of this group is to help patients review their daily goal of treatment and discuss progress on daily workbooks.  Participation Level: Did Not Attend  Participation Quality:  Not Applicable  Affect:  Not Applicable  Cognitive:  Not Applicable  Insight:  Not Applicable  Engagement in Group: Not Applicable  Additional Comments:  The patient did not attend group.    Hazle Coca S 04/25/2020, 8:44 PM

## 2020-04-25 NOTE — Telephone Encounter (Signed)
In general in [facility] setting  when patient is inpatient he/she is taken care off by inpatient team. I do not want to interfere with what they are doing but I could visit Adam Larsen on Friday if he is still on the unit.

## 2020-04-25 NOTE — Progress Notes (Signed)
[facility] MD Progress Note  04/25/2020 12:55 PM Adam Larsen.  MRN:  [id number] Subjective:  Patient is a 34 year old male with a past psychiatric history significant for anxiety, depression, bipolar disorder,ADHDwho presented to the [facility] emergency department on 04/19/2020 after ingesting multiple caustic substances. Objective: Patient is seen and examined. Patient is a 34 year old male with the above-stated past psychiatric history who is seen in follow-up.He again refused his Thorazine last night & this morning. He was asked about his mood and details about his refusal for his medications. He stated his mood is getting little better. He stated he refused his medications because he was offered Trazodone not Thorazine. Also, his parents wants him to take Haldol and he would like [place] to talk to parents before he takes any medication. He stated Strattera and Lamictal are working well for him and he will continue to take it. He still has suicidal ideations but no plan today. He has passive suicidal ideation with no plan. Phone conversation with parents: Parents were under the impression that the patient is supposed to take Haldol but they were explained about the new plan and they are they are going to encourage patient to take his medications. No new labs.   Principal Problem: <principal problem not specified> Diagnosis: Active Problems:   Bipolar 2 disorder (HCC)   Bipolar disorder (HCC)  Total Time spent with patient: 30 minutes  Past Psychiatric History: Patient is a33 year oldsingle malewitha long hx of anxiety, depression, problems with focusing, concentration, task completion. He has been treated for depression/anxiety and ADD in the past.He has a hx of having SI and admits to one suicidal attempt by cutting in August 2019. He admits to a hx of brief 2-3 day long episodes on increased energy, irritability, racing thoughts, hyperactivity, decreased need for sleep. He  also remembers having at times paranoid fears while in a hypomanic episode. 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 His depressive episodes are typically much longer and caused much more occupational and social dysfunction.HisAbilifywas changedto Latudafor bipolar depressionbut he did not respond to this medication.He alsotried 50 mg of quetiapine but hecould nottolerate it - sedated, tired the next day.He was thenstartedonRexulti for augmentation of duloxetine which he tolerated well and reported clear improvement in moodbut has been gradually gaining weight!Also,started methylphenidate xr initially 20 mg daily, then changed toConcertanow at 54 mg strength.Hereportedmuch improvement in his focusing abilitybut feels "like crashing in early afternoon).Hestarted to feelvery paranoid and depressedwith rapid mood swings in late November- his mother called thepsychiatristoffice andtheyincreased dose of Geodon to [facility] at [facility] then to 80 mg. He however could not tolerate it - reported feeling dizzy and having "chest tightness".Thenziprasidone was stoppedand started 1.5 mg of cariprazine which he toleratedwell and noticed his mood to beginning to stabilize (he admits to having mildparanoiabut is able to use cognitive skills - knows that fears are unjustified). Even though it has been approved by his insurance,price is prohibitive so he has been gettingsamples.His mood has stabilized but he wantedto get off as many medications (cariprazoine, duloxetine)as possible while keeping Concerta. He eventually decided to stay onLamictal.He noticed decline in mood, increased anxiety/paranoia. His father started giving him 2 mg of Rexulti which he had from before.  Past Medical History:  Past Medical History:  Diagnosis Date   Anxiety    Bipolar 1 disorder (HCC)    Depression    Diabetes mellitus without complication (HCC)  Mania (HCC)    Suicidal ideations     Past Surgical History:  Procedure Laterality Date   WISDOM TOOTH EXTRACTION     Family History:  Family History  Problem Relation Age of Onset   Schizophrenia Paternal Grandmother    Family Psychiatric  History: Schizophrenia in grandmother Social History:  Social History   Substance and Sexual Activity  Alcohol Use Never     Social History   Substance and Sexual Activity  Drug Use Never    Social History   Socioeconomic History   Marital status: Single    Spouse name: Not on file   Number of children: 0   Years of education: 10 or 11   Highest education level: 11th grade  Occupational History   Not on file  Tobacco Use   Smoking status: Current Every Day Smoker    Years: 10.00    Types: E-cigarettes   Smokeless tobacco: Never Used  Building services engineer Use: Every day   Substances: Nicotine, Flavoring, Nicotine-salt  Substance and Sexual Activity   Alcohol use: Never   Drug use: Never   Sexual activity: Not Currently  Other Topics Concern   Not on file  Social History Narrative   Pt lives with his parents. States he has been diagnosed with ADHD, dyslexia, dysgraphia and learning disabilities. Finished 10 th or 11 th grade but is working on his GED through an online high school program. Pt is currently unemployed.   Social Determinants of Health   Financial Resource Strain:    Difficulty of Paying Living Expenses:   Food Insecurity:    Worried About Programme researcher, broadcasting/film/video in the Last Year:    Barista in the Last Year:   Transportation Needs:    Freight forwarder (Medical):    Lack of Transportation (Non-Medical):   Physical Activity:    Days of Exercise per Week:    Minutes of Exercise per Session:   Stress:    Feeling of Stress :   Social Connections:    Frequency of Communication with Friends and Family:    Frequency of Social Gatherings with Friends and Family:    Attends  Religious Services:    Active Member of Clubs or Organizations:    Attends Banker Meetings:    Marital Status:    Additional Social History:                         Sleep: Fair  Appetite:  Good  Current Medications: Current Facility-Administered Medications  Medication Dose Route Frequency Provider Last Rate Last Admin   acetaminophen (TYLENOL) tablet 650 mg  650 mg Oral Q6H PRN [name], [name], NP       alum & mag hydroxide-simeth (MAALOX/MYLANTA) 200-200-20 MG/5ML suspension 30 mL  30 mL Oral Q4H PRN [name], MD       atomoxetine (STRATTERA) capsule 25 mg  25 mg Oral Daily [name], MD   25 mg at 04/25/20 3329   chlorproMAZINE (THORAZINE) tablet 25 mg  25 mg Oral BID Aluel Schwarz, Geralynn Rile, MD       hydrOXYzine (ATARAX/VISTARIL) tablet 25 mg  25 mg Oral TID PRN [name], MD       lamoTRIgine (LAMICTAL) tablet 200 mg  200 mg Oral QHS [name], MD   200 mg at 04/24/20 2048   magnesium hydroxide (MILK OF MAGNESIA) suspension 30 mL  30 mL  Oral Daily PRN [name], MD       nicotine (NICODERM CQ - dosed in mg/24 hours) patch 21 mg  21 mg Transdermal Daily [name], [name], NP   21 mg at 04/25/20 0800   traZODone (DESYREL) tablet 50 mg  50 mg Oral QHS PRN [name], MD        Lab Results: No results found for this or any previous visit (from the past 48 hour(s)).  Blood Alcohol level:  Lab Results  Component Value Date   ETH <10 04/19/2020    Metabolic Disorder Labs: Lab Results  Component Value Date   HGBA1C 5.1 04/21/2020   MPG 99.67 04/21/2020   No results found for: PROLACTIN Lab Results  Component Value Date   CHOL 150 04/21/2020   TRIG 71 04/21/2020   HDL 47 04/21/2020   CHOLHDL 3.2 04/21/2020   VLDL 14 04/21/2020   LDLCALC 89 04/21/2020    Physical Findings: AIMS: Facial and Oral Movements Muscles of Facial Expression: None, normal Lips and Perioral Area: None, normal Jaw:  None, normal Tongue: None, normal,Extremity Movements Upper (arms, wrists, hands, fingers): None, normal Lower (legs, knees, ankles, toes): None, normal, Trunk Movements Neck, shoulders, hips: None, normal, Overall Severity Severity of abnormal movements (highest score from questions above): None, normal Incapacitation due to abnormal movements: None, normal Patient's awareness of abnormal movements (rate only patient's report): No Awareness, Dental Status Current problems with teeth and/or dentures?: No Does patient usually wear dentures?: No  CIWA:  CIWA-Ar Total: 1 COWS:     Musculoskeletal: Strength & Muscle Tone: within normal limits Gait & Station: normal Patient leans: N/A  Psychiatric Specialty Exam: Physical Exam  Review of Systems  Blood pressure 125/88, pulse 80, temperature 97.8 F (36.6 C), temperature source Oral, resp. rate 16, height 5' 11.5" (1.816 m), weight 88.9 kg, SpO2 99 %.Body mass index is 26.96 kg/m.  General Appearance: Casual  Eye Contact:  Fair  Speech:  Clear and Coherent  Volume:  Normal  Mood:  Anxious  Affect:  Appropriate  Thought Process:  Irrelevant  Orientation:  Full (Time, Place, and Person)  Thought Content:  Illogical  Suicidal Thoughts:  Yes.  without intent/plan  Homicidal Thoughts:  No  Memory:  Immediate;   Fair  Judgement:  Impaired  Insight:  Lacking  Psychomotor Activity:  NA  Concentration:  Concentration: Good  Recall:  Fair  Fund of Knowledge:  Fair  Language:  Good  Akathisia:  NA  Handed:  Right  AIMS (if indicated):     Assets:  Social Support  ADL's:  Impaired  Cognition:  Impaired,  Mild  Sleep:  Number of Hours: 4.5     Treatment Plan Summary: Daily contact with patient to assess and evaluate symptoms and progress in treatment.  Diagnosis:  1. Bipolar Type 2 2. Generalized anxiety disorder 3. Attention deficit Hyperactivity Disorder 4. Auditory hallucinations "chatters"  Pertinent findings  today:  1. Mood is little better than yesterday 2. Sleep was 4.5 hours. 3. Patient looks anxious, refuses his medications.  Plan:  1. Continue Strattera25mg  PO daily. 2. Increase Thorazine to 25 mg BIDfor mood stabilization. 3. Continue Lamictal 200 mg PO daily at bedtime 3.Continue to attempt to engage patient in treatment for his improvement. 4. Disposition planning-in progress.     Arnoldo Lenis, MD 04/25/2020, 12:55 PM

## 2020-04-25 NOTE — Progress Notes (Signed)
   04/25/20 0607  Vital Signs  Pulse Rate 80  Resp 16  BP 125/88  BP Location Left Arm  BP Method Automatic  Patient Position (if appropriate) Standing  D: Patient presents with a depressed and anxious affect. Patient admits to SI 8/10 and rates depression 7/10 and anxiety 5/10. Patient was out in open areas and spent much of his time on the phone. A:  Patient was hesitant to take medicine. Patient took the Strattera without incident this morning but refused the Thorazine in the morning, asked about the side effects, then agreed to take it later in the morning.  Support and encouragement provided Routine safety checks conducted every 15 minutes. Patient  Informed to notify staff with any concerns.  R: Safety maintained.

## 2020-04-26 NOTE — Progress Notes (Signed)
   04/26/20 0000  Psych Admission Type (Psych Patients Only)  Admission Status Involuntary  Psychosocial Assessment  Patient Complaints Anxiety  Eye Contact Brief  Facial Expression Flat  Affect Depressed;Irritable  Speech Logical/coherent  Interaction Minimal;Forwards little;Guarded  Motor Activity Other (Comment) (WDL)  Appearance/Hygiene Unremarkable  Behavior Characteristics Anxious  Mood Depressed  Thought Process  Coherency WDL  Content Paranoia  Delusions Paranoid  Perception WDL  Hallucination None reported or observed  Judgment Poor  Confusion None  Danger to Self  Current suicidal ideation? Passive  Self-Injurious Behavior Some self-injurious ideation observed or expressed.  No lethal plan expressed   Agreement Not to Harm Self Yes  Description of Agreement verbally contracts for safety  Danger to Others  Danger to Others None reported or observed

## 2020-04-26 NOTE — Progress Notes (Signed)
   04/26/20 1945  COVID-19 Daily Checkoff  Have you had a fever (temp > 37.80C/100F)  in the past 24 hours?  No  COVID-19 EXPOSURE  Have you traveled outside the state in the past 14 days? No  Have you been in contact with someone with a confirmed diagnosis of COVID-19 or PUI in the past 14 days without wearing appropriate PPE? No  Have you been living in the same home as a person with confirmed diagnosis of COVID-19 or a PUI (household contact)? No  Have you been diagnosed with COVID-19? No

## 2020-04-26 NOTE — Progress Notes (Signed)
Pt. Attended group but chose not to share.

## 2020-04-26 NOTE — Progress Notes (Signed)
Pt is minimal and irritable this afternoon. Pt said that the doctors are still working on his medication management, which he doesn't seem too happy about. Pt said that he shares his thoughts with the doctors and "insists," but feels like he's not being listened too because in the end it's the doctors say. Pt encouraged to express his thoughts to the treatment team. Active listening, reassurance, and support provided. Pt is passively suicidal without a plan and verbally contracts for safety. Denies HI and AVH. Medications administered as ordered by MD. Q 15 min safety checks continue. Safety has been maintained.    04/26/20 1945  Psych Admission Type (Psych Patients Only)  Admission Status Involuntary  Psychosocial Assessment  Patient Complaints Anxiety;Depression;Irritability  Eye Contact Brief  Facial Expression Flat  Affect Depressed;Irritable  Speech Logical/coherent  Interaction Minimal;Guarded  Motor Activity Other (Comment) (WDL)  Appearance/Hygiene Unremarkable  Behavior Characteristics Cooperative;Anxious;Guarded  Mood Depressed;Anxious;Irritable  Thought Process  Coherency WDL  Content Paranoia  Delusions Paranoid  Perception WDL  Hallucination None reported or observed  Judgment Poor  Confusion None  Danger to Self  Current suicidal ideation? Passive  Self-Injurious Behavior No self-injurious ideation or behavior indicators observed or expressed   Agreement Not to Harm Self Yes  Description of Agreement verbally contracts for safety  Danger to Others  Danger to Others None reported or observed

## 2020-04-26 NOTE — Tx Team (Signed)
Interdisciplinary Treatment and Diagnostic Plan Update  04/26/2020 Time of Session: 8:30am Adam Larsen. MRN: [id number]  Principal Diagnosis: <principal problem not specified>  Secondary Diagnoses: Active Problems:   Bipolar 2 disorder (HCC)   Bipolar disorder (HCC)   Current Medications:  Current Facility-Administered Medications  Medication Dose Route Frequency Provider Last Rate Last Admin   acetaminophen (TYLENOL) tablet 650 mg  650 mg Oral Q6H PRN [name], [name], NP       alum & mag hydroxide-simeth (MAALOX/MYLANTA) 200-200-20 MG/5ML suspension 30 mL  30 mL Oral Q4H PRN [name], MD       atomoxetine (STRATTERA) capsule 25 mg  25 mg Oral Daily [name], MD   25 mg at 04/26/20 3149   chlorproMAZINE (THORAZINE) tablet 25 mg  25 mg Oral BID Dagar, Geralynn Rile, MD   25 mg at 04/26/20 0751   hydrOXYzine (ATARAX/VISTARIL) tablet 25 mg  25 mg Oral TID PRN [name], MD       lamoTRIgine (LAMICTAL) tablet 200 mg  200 mg Oral QHS [name], MD   200 mg at 04/25/20 2135   magnesium hydroxide (MILK OF MAGNESIA) suspension 30 mL  30 mL Oral Daily PRN [name], MD       nicotine (NICODERM CQ - dosed in mg/24 hours) patch 21 mg  21 mg Transdermal Daily [name], [name], NP   21 mg at 04/26/20 0751   traZODone (DESYREL) tablet 50 mg  50 mg Oral QHS PRN [name], MD       PTA Medications: Medications Prior to Admission  Medication Sig Dispense Refill Last Dose   Ascorbic Acid (VITAMIN C) 1000 MG tablet Take 2,000 mg by mouth daily.   04/19/2020 at Unknown time   brexpiprazole (REXULTI) 1 MG TABS tablet Take 2 mg by mouth daily.   04/19/2020 at Unknown time   cholecalciferol (VITAMIN D3) 25 MCG (1000 UNIT) tablet Take 2,000 Units by mouth daily.   04/19/2020 at Unknown time   lamoTRIgine (LAMICTAL) 150 MG tablet Take 1 tablet (150 mg total) by mouth at bedtime. 90 tablet 0 04/19/2020 at Unknown time   methylphenidate 54 MG PO CR  tablet Take 54 mg by mouth every morning.   Past Week at Unknown time   Multiple Vitamin (MULTIVITAMIN) capsule Take 1 capsule by mouth daily.   04/19/2020 at Unknown time   Omega-3 Fatty Acids (FISH OIL) 1000 MG CAPS Take 1,000 mg by mouth daily.   04/19/2020 at Unknown time   ALPRAZolam (XANAX) 0.5 MG tablet Take 1 tablet (0.5 mg total) by mouth 2 (two) times daily as needed for anxiety. 60 tablet 0    brexpiprazole (REXULTI) 1 MG TABS tablet Take 1 tablet (1 mg total) by mouth daily for 7 days, THEN 2 tablets (2 mg total) daily. (Patient not taking: Reported on 04/19/2020) 67 tablet 0    metFORMIN (GLUCOPHAGE) 500 MG tablet Take 1 tablet (500 mg total) by mouth daily with breakfast. 30 tablet 2    methylphenidate (RITALIN LA) 40 MG 24 hr capsule Take 1 capsule (40 mg total) by mouth every morning. (Patient not taking: Reported on 04/19/2020) 30 capsule 0     Patient Stressors: Other: none identified  Patient Strengths: Manufacturing systems engineer Physical Health Supportive family/friends  Treatment Modalities: Medication Management, Group therapy, Case management,  1 to 1 session with clinician, Psychoeducation, Recreational therapy.   Physician Treatment Plan for Primary Diagnosis: <principal problem not specified> Long Term Goal(s):  Improvement in symptoms so as ready for discharge Improvement in symptoms so as ready for discharge   Short Term Goals: Ability to identify changes in lifestyle to reduce recurrence of condition will improve Ability to disclose and discuss suicidal ideas Ability to identify and develop effective coping behaviors will improve Ability to maintain clinical measurements within normal limits will improve Compliance with prescribed medications will improve Ability to identify triggers associated with substance abuse/mental health issues will improve Ability to identify changes in lifestyle to reduce recurrence of condition will improve  Medication Management:  Evaluate patient's response, side effects, and tolerance of medication regimen.  Therapeutic Interventions: 1 to 1 sessions, Unit Group sessions and Medication administration.  Evaluation of Outcomes: Progressing  Physician Treatment Plan for Secondary Diagnosis: Active Problems:   Bipolar 2 disorder (HCC)   Bipolar disorder (HCC)  Long Term Goal(s): Improvement in symptoms so as ready for discharge Improvement in symptoms so as ready for discharge   Short Term Goals: Ability to identify changes in lifestyle to reduce recurrence of condition will improve Ability to disclose and discuss suicidal ideas Ability to identify and develop effective coping behaviors will improve Ability to maintain clinical measurements within normal limits will improve Compliance with prescribed medications will improve Ability to identify triggers associated with substance abuse/mental health issues will improve Ability to identify changes in lifestyle to reduce recurrence of condition will improve     Medication Management: Evaluate patient's response, side effects, and tolerance of medication regimen.  Therapeutic Interventions: 1 to 1 sessions, Unit Group sessions and Medication administration.  Evaluation of Outcomes: Progressing   RN Treatment Plan for Primary Diagnosis: <principal problem not specified> Long Term Goal(s): Knowledge of disease and therapeutic regimen to maintain health will improve  Short Term Goals: Ability to participate in decision making will improve, Ability to verbalize feelings will improve, Ability to disclose and discuss suicidal ideas, Ability to identify and develop effective coping behaviors will improve and Compliance with prescribed medications will improve  Medication Management: RN will administer medications as ordered by provider, will assess and evaluate patient's response and provide education to patient for prescribed medication. RN will report any adverse and/or  side effects to prescribing provider.  Therapeutic Interventions: 1 on 1 counseling sessions, Psychoeducation, Medication administration, Evaluate responses to treatment, Monitor vital signs and CBGs as ordered, Perform/monitor CIWA, COWS, AIMS and Fall Risk screenings as ordered, Perform wound care treatments as ordered.  Evaluation of Outcomes: Progressing   LCSW Treatment Plan for Primary Diagnosis: <principal problem not specified> Long Term Goal(s): Safe transition to appropriate next level of care at discharge, Engage patient in therapeutic group addressing interpersonal concerns.  Short Term Goals: Engage patient in aftercare planning with referrals and resources  Therapeutic Interventions: Assess for all discharge needs, 1 to 1 time with Social worker, Explore available resources and support systems, Assess for adequacy in community support network, Educate family and significant other(s) on suicide prevention, Complete Psychosocial Assessment, Interpersonal group therapy.  Evaluation of Outcomes: Progressing   Progress in Treatment: Attending groups: No. Participating in groups: No. Taking medication as prescribed: Yes. Toleration medication: Yes. Family/Significant other contact made: No, will contact:  if patient consents Patient understands diagnosis: Yes. Discussing patient identified problems/goals with staff: Yes. Medical problems stabilized or resolved: Yes. Denies suicidal/homicidal ideation: No. Endorsed passive SI Issues/concerns per patient self-inventory: No. Other:   New problem(s) identified: None   New Short Term/Long Term Goal(s):medication stabilization, elimination of SI thoughts, development of comprehensive mental wellness plan.  Update 04/26/2020: No changes at this time.  Patient Goals:  "To be on a better track, I want a job, my own place and to feel better" Update 04/26/2020: No changes at this time.  Discharge Plan or Barriers: Patient recently  admitted. CSW will continue to follow and assess for appropriate referrals and possible discharge planning. Update 04/26/2020: Patient reports plans to begin outpatient therapy.    Reason for Continuation of Hospitalization: Anxiety Depression Medication stabilization Suicidal ideation  Estimated Length of Stay: 3-5 days Update 04/26/2020: TBD.  Attendees: Patient: 04/26/2020 2:21 PM  Physician: Dr. Landry Mellow, MD 04/26/2020 2:21 PM  Nursing:  04/26/2020 2:21 PM  RN Care Manager: 04/26/2020 2:21 PM  Social Worker: Penni Homans, LCSW 04/26/2020 2:21 PM  Recreational Therapist:  04/26/2020 2:21 PM  Other: Wells Guiles, LCSW 04/26/2020 2:21 PM  Other:  04/26/2020 2:21 PM  Other: 04/26/2020 2:21 PM    Scribe for Treatment Team: Harden Mo, LCSW 04/26/2020 2:21 PM

## 2020-04-26 NOTE — Progress Notes (Signed)
   04/26/20 0609  Vital Signs  Pulse Rate 76  BP 109/79  BP Location Right Arm  BP Method Automatic  Patient Position (if appropriate) Standing   D: Patient presents with an anxious and depressed affect. Patient rated anxiety 5/10 and depression 8/10. Patient admitted to SI 8/10, but denies HI/AVH. Pt isolated in room for much of this shift. A:  Patient took scheduled medicine.  Support and encouragement provided Routine safety checks conducted every 15 minutes. Patient  Informed to notify staff with any concerns.   R: Safety maintained.

## 2020-04-26 NOTE — Progress Notes (Signed)
[place] MD Progress Note  04/26/2020 9:19 AM Adam Larsen.  MRN:  [id number] Subjective: Patient is a 34 year old male, presented to ED on 6/30 on IVC following overdose by ingesting bleach product , mouthwash and Nexium. Reported history of Bipolar Type 2 and also endorsed auditory hallucinations- "Chatters" . Objective: Patient is seen and examined. Patient is a 34 year old male with the above-stated past psychiatric history who is seen in follow-up this morning. He was asked about his mood and sleep today. He stated he has depressed mood around 7 which was 10 on his admission. He slept well but felt little groggy for about 45 minutes. He took all his medications yesterday and this morning. Still complains of passive suicidal ideation but does not have any plan. His appetite is good and he talks about how he wants his life better by getting himself stabilized here. Vitals are stable. No new labs today.  Principal Problem: <principal problem not specified> Diagnosis: Active Problems:   Bipolar 2 disorder (HCC)   Bipolar disorder (HCC)  Total Time spent with patient: 20 minutes  Past Psychiatric History: See H& P  Past Medical History:  Past Medical History:  Diagnosis Date   Anxiety    Bipolar 1 disorder (HCC)    Depression    Diabetes mellitus without complication (HCC)    Mania (HCC)    Suicidal ideations     Past Surgical History:  Procedure Laterality Date   WISDOM TOOTH EXTRACTION     Family History:  Family History  Problem Relation Age of Onset   Schizophrenia Paternal Grandmother    Family Psychiatric  History: See H& P Social History:  Social History   Substance and Sexual Activity  Alcohol Use Never     Social History   Substance and Sexual Activity  Drug Use Never    Social History   Socioeconomic History   Marital status: Single    Spouse name: Not on file   Number of children: 0   Years of education: 10 or 11   Highest education level:  11th grade  Occupational History   Not on file  Tobacco Use   Smoking status: Current Every Day Smoker    Years: 10.00    Types: E-cigarettes   Smokeless tobacco: Never Used  Building services engineer Use: Every day   Substances: Nicotine, Flavoring, Nicotine-salt  Substance and Sexual Activity   Alcohol use: Never   Drug use: Never   Sexual activity: Not Currently  Other Topics Concern   Not on file  Social History Narrative   Pt lives with his parents. States he has been diagnosed with ADHD, dyslexia, dysgraphia and learning disabilities. Finished 10 th or 11 th grade but is working on his GED through an online high school program. Pt is currently unemployed.   Social Determinants of Health   Financial Resource Strain:    Difficulty of Paying Living Expenses:   Food Insecurity:    Worried About Programme researcher, broadcasting/film/video in the Last Year:    Barista in the Last Year:   Transportation Needs:    Freight forwarder (Medical):    Lack of Transportation (Non-Medical):   Physical Activity:    Days of Exercise per Week:    Minutes of Exercise per Session:   Stress:    Feeling of Stress :   Social Connections:    Frequency of Communication with Friends and Family:    Frequency of Social Gatherings  with Friends and Family:    Attends Religious Services:    Active Member of Clubs or Organizations:    Attends Banker Meetings:    Marital Status:    Additional Social History:                         Sleep: Good  Appetite:  Good  Current Medications: Current Facility-Administered Medications  Medication Dose Route Frequency Provider Last Rate Last Admin   acetaminophen (TYLENOL) tablet 650 mg  650 mg Oral Q6H PRN [name], [name], NP       alum & mag hydroxide-simeth (MAALOX/MYLANTA) 200-200-20 MG/5ML suspension 30 mL  30 mL Oral Q4H PRN [name], MD       atomoxetine (STRATTERA) capsule 25 mg  25 mg Oral Daily [name], MD   25 mg at 04/26/20 6294   chlorproMAZINE (THORAZINE) tablet 25 mg  25 mg Oral BID Iliya Spivack, Geralynn Rile, MD   25 mg at 04/26/20 0751   hydrOXYzine (ATARAX/VISTARIL) tablet 25 mg  25 mg Oral TID PRN [name], MD       lamoTRIgine (LAMICTAL) tablet 200 mg  200 mg Oral QHS [name], MD   200 mg at 04/25/20 2135   magnesium hydroxide (MILK OF MAGNESIA) suspension 30 mL  30 mL Oral Daily PRN [name], MD       nicotine (NICODERM CQ - dosed in mg/24 hours) patch 21 mg  21 mg Transdermal Daily [name], [name], NP   21 mg at 04/26/20 0751   traZODone (DESYREL) tablet 50 mg  50 mg Oral QHS PRN [name], MD        Lab Results: No results found for this or any previous visit (from the past 48 hour(s)).  Blood Alcohol level:  Lab Results  Component Value Date   ETH <10 04/19/2020    Metabolic Disorder Labs: Lab Results  Component Value Date   HGBA1C 5.1 04/21/2020   MPG 99.67 04/21/2020   No results found for: PROLACTIN Lab Results  Component Value Date   CHOL 150 04/21/2020   TRIG 71 04/21/2020   HDL 47 04/21/2020   CHOLHDL 3.2 04/21/2020   VLDL 14 04/21/2020   LDLCALC 89 04/21/2020    Physical Findings: AIMS: Facial and Oral Movements Muscles of Facial Expression: None, normal Lips and Perioral Area: None, normal Jaw: None, normal Tongue: None, normal,Extremity Movements Upper (arms, wrists, hands, fingers): None, normal Lower (legs, knees, ankles, toes): None, normal, Trunk Movements Neck, shoulders, hips: None, normal, Overall Severity Severity of abnormal movements (highest score from questions above): None, normal Incapacitation due to abnormal movements: None, normal Patient's awareness of abnormal movements (rate only patient's report): No Awareness, Dental Status Current problems with teeth and/or dentures?: No Does patient usually wear dentures?: No  CIWA:  CIWA-Ar Total: 1 COWS:     Musculoskeletal: Strength &  Muscle Tone: within normal limits Gait & Station: normal Patient leans: N/A  Psychiatric Specialty Exam: Physical Exam  Review of Systems  Blood pressure 109/79, pulse 76, temperature 98.2 F (36.8 C), temperature source Oral, resp. rate 16, height 5' 11.5" (1.816 m), weight 88.9 kg, SpO2 99 %.Body mass index is 26.96 kg/m.  General Appearance: Casual  Eye Contact:  Good  Speech:  Normal Rate  Volume:  Normal  Mood:  Anxious  Affect:  Appropriate  Thought Process:  Goal Directed  Orientation:  Full (Time, Place, and  Person)  Thought Content:  WDL  Suicidal Thoughts:  Yes.  without intent/plan  Homicidal Thoughts:  No  Memory:  Immediate;   Good  Judgement:  Fair  Insight:  Fair  Psychomotor Activity:  NA  Concentration:  Concentration: Good  Recall:  Good  Fund of Knowledge:  Fair  Language:  Good  Akathisia:  NA  Handed:  Right  AIMS (if indicated):     Assets:  Desire for Improvement Social Support  ADL's:  Intact  Cognition:  Impaired,  Mild  Sleep:  Number of Hours: 5.75     Treatment Plan Summary: Daily contact with patient to assess and evaluate symptoms and progress in treatment.  Diagnosis:  1. Depression 2. Generalized anxiety disorder 3. Bipolar type 2 4. Auditory hallucinations - chatters ( like fifth sense) 5. Suicide ideations on & off.  Pertinent findings today:  1. Depressed mood on scale of 1-10 was 7 2. Suicidal ideations on & off with no plan 3. No AH 4. Little groggy in the morning.  Plan:  1. Continue Strattera 25 mg PO daily for mood stabilization and concentration. 2 Continue Thorazine 25 mg BID for psychosis- chatters 3. Continue Lamictal 200 mg PO QHS for mood stabilization. 4. Continue to attempt to engage patient in treatment for his improvement 5. Disposition planning in progress.   Arnoldo Lenis, MD 04/26/2020, 9:19 AM

## 2020-04-27 MED ORDER — ATOMOXETINE HCL 18 MG PO CAPS
36.0000 mg | ORAL_CAPSULE | Freq: Every day | ORAL | Status: DC
  Administered 2020-04-28 – 2020-04-30 (×3): 36 mg via ORAL
  Filled 2020-04-27 (×6): qty 2

## 2020-04-27 NOTE — Progress Notes (Signed)
   04/27/20 2105  COVID-19 Daily Checkoff  Have you had a fever (temp > 37.80C/100F)  in the past 24 hours?  No  COVID-19 EXPOSURE  Have you traveled outside the state in the past 14 days? No  Have you been in contact with someone with a confirmed diagnosis of COVID-19 or PUI in the past 14 days without wearing appropriate PPE? No  Have you been living in the same home as a person with confirmed diagnosis of COVID-19 or a PUI (household contact)? No  Have you been diagnosed with COVID-19? No

## 2020-04-27 NOTE — Progress Notes (Signed)
Pt continues to be passively suicidal without a plan and verbally contracts for safety. Pt was asked what his triggers for suicide are and he said "depression." When asked about his stressors, he mentioned that he doesn't have a good rapport with the doctor that's currently seeing him. Pt also stated that he feels like the thorazine isn't working for him. Pt is minimal upon interactions and doesn't elaborate much with questions. Denies AVH. Active listening, reassurance and support provided. Medications administered as ordered by MD. Q 15 min safety checks continue. Safety has been maintained.    04/27/20 2105  Psych Admission Type (Psych Patients Only)  Admission Status Involuntary  Psychosocial Assessment  Patient Complaints Depression;Irritability  Eye Contact Brief  Facial Expression Flat  Affect Depressed;Irritable  Speech Logical/coherent  Interaction Minimal;Forwards little  Motor Activity Other (Comment) (WNL)  Appearance/Hygiene Unremarkable  Behavior Characteristics Guarded;Irritable;Cooperative  Mood Depressed;Irritable  Thought Process  Coherency WDL  Content Paranoia  Delusions Paranoid  Perception WDL  Hallucination None reported or observed  Judgment Poor  Confusion None  Danger to Self  Current suicidal ideation? Passive  Self-Injurious Behavior No self-injurious ideation or behavior indicators observed or expressed   Agreement Not to Harm Self Yes  Description of Agreement verbally contracts for safety  Danger to Others  Danger to Others None reported or observed

## 2020-04-27 NOTE — Progress Notes (Signed)
   04/27/20 1300  Psych Admission Type (Psych Patients Only)  Admission Status Involuntary  Psychosocial Assessment  Patient Complaints Irritability  Eye Contact Brief  Facial Expression Flat  Affect Irritable  Speech Logical/coherent  Interaction Minimal  Motor Activity Other (Comment) (WNL)  Appearance/Hygiene Unremarkable  Behavior Characteristics Guarded;Irritable  Mood Depressed;Irritable  Thought Process  Coherency WDL  Content Paranoia  Delusions Paranoid  Perception WDL  Hallucination None reported or observed  Judgment Poor  Confusion None  Danger to Self  Current suicidal ideation? Passive  Self-Injurious Behavior No self-injurious ideation or behavior indicators observed or expressed   Agreement Not to Harm Self Yes  Description of Agreement verbal contract  Danger to Others  Danger to Others None reported or observed   Pt has been irritable, guarded with minimal interaction. He has passive si thoughts and contracts for safety. Offered support, encouragement and 15 minute checks. Safety maintained on the unit.

## 2020-04-27 NOTE — Progress Notes (Signed)
Past Medication history from 01/09/2019. Some medications are from notes.  Atypical Antipsychotics:  1. Aripiprazole (Abilify) 15 mg 2. Lurasidone ( Latuda) 40 mg 3. Quetiapine (Seroquel) 50 mg 4. Risperidone (Risperdal) 2 mg 5. Ziprasidone ( Geodon) 60 mg 6. Cariprazine ( Vraylar) 3 mg PO 7. Brexpiprazole ( Rexulti) 2 mg  Psychostimulants:  1.  Methylphenidate long acting (Concerta 54 mg 2.  Methylphenidate (Ritalin)  Mood Stabilizers:  1. Lamotrigine ( Lamictal ) 300 mg   Anxiolytics:  1. Alprazolam 0.5 mg  Antidepressants:  SNRI's  1. Duloxetine ( Cymbalta) 90 mg PO  Atypical antidepressant  1. Bupropion ( Wellbutrin)  SSRI's  1. Venlafaxine ( Effexor)

## 2020-04-27 NOTE — Progress Notes (Signed)
[facility] MD Progress Note  04/27/2020 11:08 AM Adam Roque Cash.  MRN:  [id number]   Subjective:  Patient stated on the scale of 1-10 his mood is depressed around 8. He stated he has suicidal ideations but no plan. He also stated even if he has a plan he is not going to do it because he would never put anyone around him in danger.He said he slept well last night but did feel groggy in the morning for about 45 mins to 1 hour. He asked to increase his Strattera because he thinks it is helping with his mood. He talked about "no plan" to go home because he is going to stay here until his mood is totally stable. He told that he trusts his doctor because his parents really trust them. He stated his appetite is good.   Collateral: Called parents today and explained them about the treatment plan. Explained them to not interfere with his plan by giving him extra information from the Internet. They were polite and showed understanding and stated are not going to give him any new information. Also, are encouraging him to stick to his treatment and listen to his management team.  Objective: Patient is seen and examined. Initially, he looked anxious but he looked comfortable after starting talking about his day. He had fair eye contact, showed appropriate affect, had full concentration. He has a fair insight about his symptoms and further treatment. Vitals are stable today - Temp-97.6, BP on right arm standing-98/80 and no new labs.  Principal Problem: <principal problem not specified> Diagnosis: Active Problems:   Bipolar 2 disorder (HCC)   Bipolar disorder (HCC)  Total Time spent with patient: 30 minutes  Past Psychiatric History: See H & P  Past Medical History:  Past Medical History:  Diagnosis Date  . Anxiety   . Bipolar 1 disorder (HCC)   . Depression   . Diabetes mellitus without complication (HCC)   . Mania (HCC)   . Suicidal ideations     Past Surgical History:  Procedure Laterality Date  .  WISDOM TOOTH EXTRACTION     Family History:  Family History  Problem Relation Age of Onset  . Schizophrenia Paternal Grandmother    Family Psychiatric  History: See H & P Social History:  Social History   Substance and Sexual Activity  Alcohol Use Never     Social History   Substance and Sexual Activity  Drug Use Never    Social History   Socioeconomic History  . Marital status: Single    Spouse name: Not on file  . Number of children: 0  . Years of education: 10 or 24  . Highest education level: 11th grade  Occupational History  . Not on file  Tobacco Use  . Smoking status: Current Every Day Smoker    Years: 10.00    Types: E-cigarettes  . Smokeless tobacco: Never Used  Vaping Use  . Vaping Use: Every day  . Substances: Nicotine, Flavoring, Nicotine-salt  Substance and Sexual Activity  . Alcohol use: Never  . Drug use: Never  . Sexual activity: Not Currently  Other Topics Concern  . Not on file  Social History Narrative   Pt lives with his parents. States he has been diagnosed with ADHD, dyslexia, dysgraphia and learning disabilities. Finished 10 th or 11 th grade but is working on his GED through an online high school program. Pt is currently unemployed.   Social Determinants of Health   Financial Resource Strain:   .  Difficulty of Paying Living Expenses:   Food Insecurity:   . Worried About Programme researcher, broadcasting/film/video in the Last Year:   . Barista in the Last Year:   Transportation Needs:   . Freight forwarder (Medical):   Marland Kitchen Lack of Transportation (Non-Medical):   Physical Activity:   . Days of Exercise per Week:   . Minutes of Exercise per Session:   Stress:   . Feeling of Stress :   Social Connections:   . Frequency of Communication with Friends and Family:   . Frequency of Social Gatherings with Friends and Family:   . Attends Religious Services:   . Active Member of Clubs or Organizations:   . Attends Banker Meetings:   Marland Kitchen  Marital Status:    Additional Social History:                         Sleep: Good  Appetite:  Good  Current Medications: Current Facility-Administered Medications  Medication Dose Route Frequency Provider Last Rate Last Admin  . acetaminophen (TYLENOL) tablet 650 mg  650 mg Oral Q6H PRN [name], [name], NP      . alum & mag hydroxide-simeth (MAALOX/MYLANTA) 200-200-20 MG/5ML suspension 30 mL  30 mL Oral Q4H PRN [name], MD      . atomoxetine (STRATTERA) capsule 25 mg  25 mg Oral Daily [name], MD   25 mg at 04/27/20 0723  . chlorproMAZINE (THORAZINE) tablet 25 mg  25 mg Oral BID Skyra Crichlow, Geralynn Rile, MD   25 mg at 04/27/20 1324  . hydrOXYzine (ATARAX/VISTARIL) tablet 25 mg  25 mg Oral TID PRN [name], MD      . lamoTRIgine (LAMICTAL) tablet 200 mg  200 mg Oral QHS [name], MD   200 mg at 04/26/20 2108  . magnesium hydroxide (MILK OF MAGNESIA) suspension 30 mL  30 mL Oral Daily PRN [name], MD      . nicotine (NICODERM CQ - dosed in mg/24 hours) patch 21 mg  21 mg Transdermal Daily [name], [name], NP   21 mg at 04/27/20 0722  . traZODone (DESYREL) tablet 50 mg  50 mg Oral QHS PRN [name], MD        Lab Results: No results found for this or any previous visit (from the past 48 hour(s)).  Blood Alcohol level:  Lab Results  Component Value Date   ETH <10 04/19/2020    Metabolic Disorder Labs: Lab Results  Component Value Date   HGBA1C 5.1 04/21/2020   MPG 99.67 04/21/2020   No results found for: PROLACTIN Lab Results  Component Value Date   CHOL 150 04/21/2020   TRIG 71 04/21/2020   HDL 47 04/21/2020   CHOLHDL 3.2 04/21/2020   VLDL 14 04/21/2020   LDLCALC 89 04/21/2020    Physical Findings: AIMS: Facial and Oral Movements Muscles of Facial Expression: None, normal Lips and Perioral Area: None, normal Jaw: None, normal Tongue: None, normal,Extremity Movements Upper (arms, wrists, hands, fingers):  None, normal Lower (legs, knees, ankles, toes): None, normal, Trunk Movements Neck, shoulders, hips: None, normal, Overall Severity Severity of abnormal movements (highest score from questions above): None, normal Incapacitation due to abnormal movements: None, normal Patient's awareness of abnormal movements (rate only patient's report): No Awareness, Dental Status Current problems with teeth and/or dentures?: No Does patient usually wear dentures?: No  CIWA:  CIWA-Ar Total: 1  COWS:     Musculoskeletal: Strength & Muscle Tone: within normal limits Gait & Station: normal Patient leans: N/A  Psychiatric Specialty Exam: Physical Exam  Review of Systems  Blood pressure 98/80, pulse 73, temperature 97.6 F (36.4 C), temperature source Oral, resp. rate 16, height 5' 11.5" (1.816 m), weight 88.9 kg, SpO2 100 %.Body mass index is 26.96 kg/m.  General Appearance: Casual but some seborrheic dermatitis around beard and eyebroes  Eye Contact:  Fair  Speech:  Normal Rate  Volume:  Normal  Mood:  Anxious and Normal afterwards  Affect:  Appropriate  Thought Process:  Goal Directed  Orientation:  Full (Time, Place, and Person)  Thought Content:  WDL  Suicidal Thoughts:  Yes.  without intent/plan  Homicidal Thoughts:  No  Memory:  Immediate;   Good  Judgement:  Fair  Insight:  Fair  Psychomotor Activity:  Normal  Concentration:  Concentration: Good  Recall:  Good  Fund of Knowledge:  Fair  Language:  Good  Akathisia:  No  Handed:  Right  AIMS (if indicated):     Assets:  Desire for Improvement Social Support  ADL's:  Intact  Cognition:  Impaired,  Mild  Sleep:  Number of Hours: 6    Assessment:  Patient is a 34 year old male, presented to ED on 6/30 on IVC following overdose by ingesting bleach product , mouthwash and Nexium. Reported history of Bipolar Type 2 and also endorsed auditory hallucinations- "Chatters" . Patient did not look paranoid today, he agreed to take same doses  of the medications except Strattera. He needs to be motivated continuously to comply with his treatment plan.   Plan:  1. Increase Strattera 36 mg for mood stabilization. 2. Continue Thorazine 25 mg BID for psychosis. 3. Continue Lamictal 200 mg PO QHS for mood stabilization. 4. Continue encouraging patient to engage in group therapy. 5. Disposition planning in progress      Treatment Plan Summary: Daily contact with patient to assess and evaluate symptoms and progress in treatment  Adam Lenis, MD 04/27/2020, 11:08 AM

## 2020-04-27 NOTE — [facility] Counselor (Signed)
Adult Comprehensive Assessment  Patient ID: Adam Larsen., male   DOB: [birth year], 34 y.o.   MRN: [id number]  Information Source: Information source: Patient  Current Stressors:  Patient states their primary concerns and needs for treatment are:: Suicide attempt Patient states their goals for this hospitilization and ongoing recovery are:: get better Educational / Learning stressors: yes Employment / Job issues: typical Family Relationships: no Surveyor, quantity / Lack of resources (include bankruptcy): Lives with parents and wants to move out Housing / Lack of housing: see above Physical health (include injuries & life threatening diseases): no Social relationships: no Substance abuse: no Bereavement / Loss: pain from the losing someone  Living/Environment/Situation:  Living Arrangements: Parent Living conditions (as described by patient or guardian): Good Who else lives in the home?: Mother, father and brother How long has patient lived in current situation?: entire life What is atmosphere in current home: Comfortable, Paramedic, Supportive  Family History:  Marital status: Single Are you sexually active?: No What is your sexual orientation?: straight Does patient have children?: No  Childhood History:  By whom was/is the patient raised?: Both parents Description of patient's relationship with caregiver when they were a child: great Patient's description of current relationship with people who raised him/her: still great How were you disciplined when you got in trouble as a child/adolescent?: yelled at and time out Does patient have siblings?: Yes Number of Siblings: 2 Description of patient's current relationship with siblings: Two brothers, patient is the middle child Did patient suffer any verbal/emotional/physical/sexual abuse as a child?: No Did patient suffer from severe childhood neglect?: No Has patient ever been sexually abused/assaulted/raped as an adolescent or adult?:  No Type of abuse, by whom, and at what age:  n/a Was the patient ever a victim of a crime or a disaster?: No How has this affected patient's relationships?: Has not affected his relationships. 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 Has patient ever been in the [employer]?: No  Financial Resources:   Surveyor, quantity resources: No income, Support from parents / caregiver, Media planner Does patient have a Lawyer or guardian?: No  Alcohol/Substance Abuse:   What has been your use of drugs/alcohol within the last 12 months?: Rarely drinks If attempted suicide, did drugs/alcohol play a role in this?: No Alcohol/Substance Abuse Treatment Hx: Denies past history Has alcohol/substance abuse ever caused legal problems?: No  Social Support System:   Conservation officer, nature Support System: Good Describe Community Support System: parents/brothers Type of faith/religion: no How does patient's faith help to cope with current illness?: N/A  Leisure/Recreation:   Do You Have Hobbies?: Yes Leisure and Hobbies: Computers, video games, reading  Strengths/Needs:   What is the patient's perception of their strengths?: Patient cannot think of any of their strengths Patient states they can use these personal strengths during their treatment to contribute to their recovery: n/a Patient states these barriers may affect/interfere with their  treatment: no Patient states these barriers may affect their return to the community:  no Other important information patient would like considered in planning for their treatment: no  Discharge Plan:   Currently receiving community mental health services: Yes (From Whom) Patient states concerns and preferences for aftercare planning are: Patient is receiving psychiatry from Dr. Hinton Dyer and is interested in therapy Patient states they will know when they are safe and ready for discharge when: When suicidal thoughts stop Does patient have access to transportation?: Yes Does patient have financial barriers related to discharge medications?: No Patient description of barriers related to discharge medications: Parents will help him Will patient be returning to same living situation after discharge?: Yes  Summary/Recommendations:   Summary and Recommendations (to be completed by the evaluator): Patient is a 34yo male admitted under IVC due to suicidal thoughts with reports of an attempt to electrocute himself, cut his wrist with a knife, and swallow cleaning products. Primary stressors are his learning disabilities, having difficulty maintaining employment, and feeling that people judge him for living with his parents. He denies substance abuse. Patient will benefit from crisis stabilization, medication evaluation, group therapy and psychoeducation, in addition to case management for discharge planning. At discharge it is recommended that Patient adhere to the established discharge plan and continue in treatment.  Evorn Gong. 04/27/2020

## 2020-04-28 NOTE — Progress Notes (Signed)
   04/28/20 2000  Psych Admission Type (Psych Patients Only)  Admission Status Involuntary  Psychosocial Assessment  Patient Complaints Anxiety  Eye Contact Brief  Facial Expression Flat  Affect Depressed;Irritable  Speech Logical/coherent  Interaction Minimal;Forwards little  Motor Activity Other (Comment) (WNL)  Appearance/Hygiene Unremarkable  Behavior Characteristics Guarded  Mood Depressed  Thought Process  Coherency WDL  Content Paranoia  Delusions Paranoid  Perception WDL  Hallucination None reported or observed  Judgment Poor  Confusion None  Danger to Self  Current suicidal ideation? Passive  Self-Injurious Behavior No self-injurious ideation or behavior indicators observed or expressed   Agreement Not to Harm Self Yes  Description of Agreement verbally contracts for safety  Danger to Others  Danger to Others None reported or observed

## 2020-04-28 NOTE — Progress Notes (Signed)
   04/28/20 0617  Vital Signs  Pulse Rate 79  BP 114/80  BP Location Right Arm  BP Method Automatic  Patient Position (if appropriate) Standing   D: Patient admits to some SI 6/10 but reported that he feels better.  Patient denies HI and AVH. Patient rated anxiety 2/10 and depression 8/10. A:  Patient took scheduled medicine.  Support and encouragement provided Routine safety checks conducted every 15 minutes. Patient  Informed to notify staff with any concerns.  R:Safety maintained.

## 2020-04-28 NOTE — Progress Notes (Addendum)
[facility] MD Progress Note  04/28/2020 1:01 PM Adam Larsen.  MRN:  [id number]   Subjective: Patient states on the scale of 1-10 his mood is depressed around 7. He added it's getting better everyday. On scale of 1 to 10 he reported his anxiety to be neutral around 3. He states his frequency for suicidal ideation is decreasing in frequency and he does not have a plan for it. He states he slept well last night and felt groggy for about 30 minutes in the morning. He reported no side effects from his medications and stated he would continue the same dose.  Phone conversation with parents: Spent around 25 minutes on phone with parents. Dr. Jama Flavors and me (Resident) called parents together to talk about patient's discharge plan. They were informed that the patient is doing better and when asked about their opininon they stated that this is first time in longtime that they hear the patient laughing and making jokes. He sounds better to them and they also think he is doing good. They were explained about partial hospitalization and intensive care therapy. Also, they were informed that Social worker would help in getting the patient an outpatient appointment before he gets discharged from here.  Objective: Patient is seen and examined. Patient looked in good mood and was laughing and making jokes. He was alert, cooperative and was making appropriate eye contact. He was asked to increase his medication Thorazine to 50 mg at night but he refused. Also, he brought up discussion about his discharge and was explained about it. He was asked his permission to make a plan for his parents and he verbally consented to that after asking why he need their permission when he is a 34 year old person. He needs to be motivated continuously to comply with his treatment plan.Vitals are stable. No new labs today.  Principal Problem: <principal problem not specified> Diagnosis: Active Problems:   Bipolar 2 disorder (HCC)   Bipolar  disorder (HCC)  Total Time spent with patient: 20 minutes  Past Psychiatric History: See H & P  Past Medical History:  Past Medical History:  Diagnosis Date   Anxiety    Bipolar 1 disorder (HCC)    Depression    Diabetes mellitus without complication (HCC)    Mania (HCC)    Suicidal ideations     Past Surgical History:  Procedure Laterality Date   WISDOM TOOTH EXTRACTION     Family History:  Family History  Problem Relation Age of Onset   Schizophrenia Paternal Grandmother    Family Psychiatric  History: See H& P Social History:  Social History   Substance and Sexual Activity  Alcohol Use Never     Social History   Substance and Sexual Activity  Drug Use Never    Social History   Socioeconomic History   Marital status: Single    Spouse name: Not on file   Number of children: 0   Years of education: 10 or 11   Highest education level: 11th grade  Occupational History   Not on file  Tobacco Use   Smoking status: Current Every Day Smoker    Years: 10.00    Types: E-cigarettes   Smokeless tobacco: Never Used  Building services engineer Use: Every day   Substances: Nicotine, Flavoring, Nicotine-salt  Substance and Sexual Activity   Alcohol use: Never   Drug use: Never   Sexual activity: Not Currently  Other Topics Concern   Not on file  Social History Narrative   Pt lives with his parents. States he has been diagnosed with ADHD, dyslexia, dysgraphia and learning disabilities. Finished 10 th or 11 th grade but is working on his GED through an online high school program. Pt is currently unemployed.   Social Determinants of Health   Financial Resource Strain:    Difficulty of Paying Living Expenses:   Food Insecurity:    Worried About Programme researcher, broadcasting/film/video in the Last Year:    Barista in the Last Year:   Transportation Needs:    Freight forwarder (Medical):    Lack of Transportation (Non-Medical):   Physical Activity:     Days of Exercise per Week:    Minutes of Exercise per Session:   Stress:    Feeling of Stress :   Social Connections:    Frequency of Communication with Friends and Family:    Frequency of Social Gatherings with Friends and Family:    Attends Religious Services:    Active Member of Clubs or Organizations:    Attends Banker Meetings:    Marital Status:    Additional Social History:                         Sleep: Good  Appetite:  Good  Current Medications: Current Facility-Administered Medications  Medication Dose Route Frequency Provider Last Rate Last Admin   acetaminophen (TYLENOL) tablet 650 mg  650 mg Oral Q6H PRN [name], [name], NP       alum & mag hydroxide-simeth (MAALOX/MYLANTA) 200-200-20 MG/5ML suspension 30 mL  30 mL Oral Q4H PRN [name], MD       atomoxetine (STRATTERA) capsule 36 mg  36 mg Oral Daily Dagar, Anjali, MD   36 mg at 04/28/20 0748   chlorproMAZINE (THORAZINE) tablet 25 mg  25 mg Oral BID Dagar, Geralynn Rile, MD   25 mg at 04/28/20 0748   hydrOXYzine (ATARAX/VISTARIL) tablet 25 mg  25 mg Oral TID PRN [name], MD       lamoTRIgine (LAMICTAL) tablet 200 mg  200 mg Oral QHS [name], MD   200 mg at 04/27/20 2108   magnesium hydroxide (MILK OF MAGNESIA) suspension 30 mL  30 mL Oral Daily PRN [name], MD       nicotine (NICODERM CQ - dosed in mg/24 hours) patch 21 mg  21 mg Transdermal Daily [name], [name], NP   21 mg at 04/28/20 0748   traZODone (DESYREL) tablet 50 mg  50 mg Oral QHS PRN [name], MD        Lab Results: No results found for this or any previous visit (from the past 48 hour(s)).  Blood Alcohol level:  Lab Results  Component Value Date   ETH <10 04/19/2020    Metabolic Disorder Labs: Lab Results  Component Value Date   HGBA1C 5.1 04/21/2020   MPG 99.67 04/21/2020   No results found for: PROLACTIN Lab Results  Component Value Date   CHOL 150  04/21/2020   TRIG 71 04/21/2020   HDL 47 04/21/2020   CHOLHDL 3.2 04/21/2020   VLDL 14 04/21/2020   LDLCALC 89 04/21/2020    Physical Findings: AIMS: Facial and Oral Movements Muscles of Facial Expression: None, normal Lips and Perioral Area: None, normal Jaw: None, normal Tongue: None, normal,Extremity Movements Upper (arms, wrists, hands, fingers): None, normal Lower (legs, knees, ankles, toes): None, normal, Trunk  Movements Neck, shoulders, hips: None, normal, Overall Severity Severity of abnormal movements (highest score from questions above): None, normal Incapacitation due to abnormal movements: None, normal Patient's awareness of abnormal movements (rate only patient's report): No Awareness, Dental Status Current problems with teeth and/or dentures?: No Does patient usually wear dentures?: No  CIWA:  CIWA-Ar Total: 1 COWS:     Musculoskeletal: Strength & Muscle Tone: within normal limits Gait & Station: normal Patient leans: N/A  Psychiatric Specialty Exam: Physical Exam  Review of Systems  Blood pressure 114/80, pulse 79, temperature 97.8 F (36.6 C), temperature source Oral, resp. rate 16, height 5' 11.5" (1.816 m), weight 88.9 kg, SpO2 100 %.Body mass index is 26.96 kg/m.  General Appearance: Neat  Eye Contact:  Good  Speech:  Normal Rate  Volume:  Normal  Mood:  Good  Affect:  Appropriate  Thought Process:  Goal Directed  Orientation:  Full (Time, Place, and Person)  Thought Content:  WDL  Suicidal Thoughts:  Yes.  without intent/plan  Homicidal Thoughts:  No  Memory:  Immediate;   Good  Judgement:  Fair  Insight:  Fair  Psychomotor Activity:  NA  Concentration:  Concentration: Good  Recall:  Fair  Fund of Knowledge:  Fair  Language:  Good  Akathisia:  NA  Handed:  Right  AIMS (if indicated):     Assets:  Desire for Improvement Social Support  ADL's:  Intact  Cognition:  WNL  Sleep:  Number of Hours: 6   Assessment:  Patient is a89 year old  male, presented to ED on 6/30 on IVC following overdose by ingesting bleach product , mouthwash and Nexium. Patient has reportedhistory of Bipolar Type 2but we do not see any symptoms here except reported depressed mood. He has also endorsed auditory hallucinations- "Chatters" but has not complained about them since he is here. We are thinking may be it changed because he is not on stimulants anymore. He has reported unspecified Anxiety and he reported his anxiety is neutral these days. Apart from it, he has suspected obsessive compulsive disorder as he is extremely focused about his medications. 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  Treatment Plan Summary: Daily contact with patient to assess and evaluate symptoms and progress in treatment.  Plan:  1. Continue Strattera 36 mg for mood stabilization. 2. Continue Thorazine 25 mg BID for psychosis. 3. Continue Lamictal 200 mg PO QHS for mood stabilization. 4. Continue encouraging patient to engage in group therapy. 5. Disposition planning in progress.    Arnoldo Lenis, MD 04/28/2020, 1:01 PM    Attestation  I saw patient earlier today and discussed case with treatment team and with Dr. Gerarda Fraction . Today he acknowledges improvement compared to how he felt on admission. Although he remains relatively minimal with Clinical research associate and requests to speak only with Dr. Gerarda Fraction, he does appear less suspicious and less irritable today, and does acknowledge he is feeling better than he did on admission. His parents also report that they feel he is doing better and that he is exhibiting a fuller range of affect. He has reported ( to Dr. Gerarda Fraction) some ongoing passive SI but has describes these as improving , less frequent, and has presented more future oriented, focusing more on discharge planning . Treatment team reviewed- he may benefit from IOP or PHP once he steps down  from inpatient level of care . He is tolerating  Strattera, Thorazine, Lamictal well at this time. He is on a low dose of Thorazine but has been reluctant to increase dose . ( But as noted , he is presenting with improved mood, decreased irritability, and has not endorsed or presented with hallucinations/internal preoccupation).   Plan- continue inpatient treatment, continue Strattera 36 mgrs QDAY, Thorazine 25 mgrs BID, Lamictal 200 mgrs QDAY, consider discharge soon as he continues to stabilize/improve.   Sallyanne HaversF Nichalos Brenton MD

## 2020-04-29 DIAGNOSIS — F3130 Bipolar disorder, current episode depressed, mild or moderate severity, unspecified: Secondary | ICD-10-CM

## 2020-04-29 NOTE — [facility] Group Notes (Signed)
Adult Psychoeducational Group Note  Date:  04/29/2020 Time:  9:59 AM  Group Topic/Focus:  Goals Group:   The focus of this group is to help patients establish daily goals to achieve during treatment and discuss how the patient can incorporate goal setting into their daily lives to aide in recovery.  Participation Level:  Active  Participation Quality:  Appropriate  Affect:  Appropriate  Cognitive:  Oriented  Insight: Good  Engagement in Group:  Engaged  Modes of Intervention:  Discussion, Education and Problem-solving  Additional Comments:  Pt said his goal for today is to read a chapter from his self help book and talk with his doctor about his discharge plans  [name] 04/29/2020, 9:59 AM

## 2020-04-29 NOTE — Progress Notes (Signed)
   04/29/20 2201  Psych Admission Type (Psych Patients Only)  Admission Status Involuntary  Psychosocial Assessment  Patient Complaints None  Eye Contact Brief  Facial Expression Flat  Affect Appropriate to circumstance  Speech Logical/coherent  Interaction Minimal  Motor Activity Other (Comment) (WDL)  Appearance/Hygiene Unremarkable  Behavior Characteristics Guarded  Mood Depressed  Thought Process  Coherency WDL  Content Paranoia  Delusions Paranoid  Perception WDL  Hallucination None reported or observed  Judgment Poor  Confusion None  Danger to Self  Current suicidal ideation? Denies  Self-Injurious Behavior No self-injurious ideation or behavior indicators observed or expressed   Agreement Not to Harm Self Yes  Description of Agreement verbally contracts for safety  Danger to Others  Danger to Others None reported or observed

## 2020-04-29 NOTE — [facility] Group Notes (Signed)
Adult Psychoeducational Group Note  Date:  04/29/2020 Time:  2:42 PM  Group Topic/Focus:  Identifying Needs:   The focus of this group is to help patients identify their personal needs that have been historically problematic and identify healthy behaviors to address their needs.  Participation Level:  Did Not Attend   Adam Larsen 04/29/2020, 2:42 PM

## 2020-04-29 NOTE — [facility] Group Notes (Signed)
Adult Psychoeducational Group Note  Date:  04/29/2020 Time:  2:43 PM  Group Topic/Focus:  Identifying Needs:   The focus of this group is to help patients identify their personal needs that have been historically problematic and identify healthy behaviors to address their needs.  Participation Level:  Did Not Attend  [name] 04/29/2020, 2:43 PM

## 2020-04-29 NOTE — Progress Notes (Signed)
D. Pt presents with a flat affect/ depressed mood- calm and cooperative, but somewhat guarded behavior- Pt observed reading his self help book in his room for much of the shift. Pt politely declined having his EKG obtained today. Pt currently denies SI/HI and AVH A. Labs and vitals monitored. Pt compliant with medications. Pt supported emotionally and encouraged to express concerns and ask questions.   R. Pt remains safe with 15 minute checks. Will continue POC.

## 2020-04-29 NOTE — Progress Notes (Signed)
[place] NOVEL CORONAVIRUS (COVID-19) DAILY CHECK-OFF SYMPTOMS - answer yes or no to each - every day NO YES  Have you had a fever in the past 24 hours?  . Fever (Temp > 37.80C / 100F) X   Have you had any of these symptoms in the past 24 hours? . New Cough .  Sore Throat  .  Shortness of Breath .  Difficulty Breathing .  Unexplained Body Aches   X   Have you had any one of these symptoms in the past 24 hours not related to allergies?   . Runny Nose .  Nasal Congestion .  Sneezing   X   If you have had runny nose, nasal congestion, sneezing in the past 24 hours, has it worsened?  X   EXPOSURES - check yes or no X   Have you traveled outside the state in the past 14 days?  X   Have you been in contact with someone with a confirmed diagnosis of COVID-19 or PUI in the past 14 days without wearing appropriate PPE?  X   Have you been living in the same home as a person with confirmed diagnosis of COVID-19 or a PUI (household contact)?    X   Have you been diagnosed with COVID-19?    X              What to do next: Answered NO to all: Answered YES to anything:   Proceed with unit schedule Follow the [facility] Inpatient Flowsheet.   

## 2020-04-29 NOTE — Progress Notes (Signed)
[facility] MD Progress Note  04/29/2020 10:18 AM Adam Larsen.  MRN:  [id number]   Subjective: Patient acknowledges he is feeling better compared to admission . Denies medication side effects. Objective : I reviewed chart notes and met with patient. Presents calm, without psychomotor agitation, sitting on his bed reading on approach. Remains vaguely guarded but polite. States " I know Dr. Demaris Callander is not here today, but as I said, I only want to talk to her". Today states "  I am doing OK, I feel I am safe right now".  Has endorsed passive SI intermittently but has reported these have improved and currently denies suicidal plan or intention/contracts for safety on unit. Also does not endorse medication side effects.  No disruptive or agitated behaviors on unit, attending groups, participating appropriately.      Principal Problem:  Bipolar Disorder  Diagnosis: Active Problems:   Bipolar 2 disorder (HCC)   Bipolar disorder ([place])  Total Time spent with patient: 15 minutes  Past Psychiatric History: See H & P  Past Medical History:  Past Medical History:  Diagnosis Date  . Anxiety   . Bipolar 1 disorder ([place])   . Depression   . Diabetes mellitus without complication ([place])   . Mania ([place])   . Suicidal ideations     Past Surgical History:  Procedure Laterality Date  . WISDOM TOOTH EXTRACTION     Family History:  Family History  Problem Relation Age of Onset  . Schizophrenia Paternal Grandmother    Family Psychiatric  History: See H& P Social History:  Social History   Substance and Sexual Activity  Alcohol Use Never     Social History   Substance and Sexual Activity  Drug Use Never    Social History   Socioeconomic History  . Marital status: Single    Spouse name: Not on file  . Number of children: 0  . Years of education: 10 or 33  . Highest education level: 11th grade  Occupational History  . Not on file  Tobacco Use  . Smoking status: Current Every Day  Smoker    Years: 10.00    Types: E-cigarettes  . Smokeless tobacco: Never Used  Vaping Use  . Vaping Use: Every day  . Substances: Nicotine, Flavoring, Nicotine-salt  Substance and Sexual Activity  . Alcohol use: Never  . Drug use: Never  . Sexual activity: Not Currently  Other Topics Concern  . Not on file  Social History Narrative   Pt lives with his parents. States he has been diagnosed with ADHD, dyslexia, dysgraphia and learning disabilities. Finished 10 th or 11 th grade but is working on his GED through an online high school program. Pt is currently unemployed.   Social Determinants of Health   Financial Resource Strain:   . Difficulty of Paying Living Expenses:   Food Insecurity:   . Worried About Charity fundraiser in the Last Year:   . Arboriculturist in the Last Year:   Transportation Needs:   . Film/video editor (Medical):   Marland Kitchen Lack of Transportation (Non-Medical):   Physical Activity:   . Days of Exercise per Week:   . Minutes of Exercise per Session:   Stress:   . Feeling of Stress :   Social Connections:   . Frequency of Communication with Friends and Family:   . Frequency of Social Gatherings with Friends and Family:   . Attends Religious Services:   . Active Member of  Clubs or Organizations:   . Attends Archivist Meetings:   Marland Kitchen Marital Status:    Additional Social History:   Sleep: Good  Appetite:  Good  Current Medications: Current Facility-Administered Medications  Medication Dose Route Frequency Provider Last Rate Last Admin  . acetaminophen (TYLENOL) tablet 650 mg  650 mg Oral Q6H PRN [name], [name], NP      . alum & mag hydroxide-simeth (MAALOX/MYLANTA) 200-200-20 MG/5ML suspension 30 mL  30 mL Oral Q4H PRN Sharma Covert, MD      . atomoxetine (STRATTERA) capsule 36 mg  36 mg Oral Daily Dagar, Meredith Staggers, MD   36 mg at 04/29/20 0831  . chlorproMAZINE (THORAZINE) tablet 25 mg  25 mg Oral BID Dagar, Meredith Staggers, MD   25 mg at 04/29/20  0830  . hydrOXYzine (ATARAX/VISTARIL) tablet 25 mg  25 mg Oral TID PRN Sharma Covert, MD      . lamoTRIgine (LAMICTAL) tablet 200 mg  200 mg Oral QHS Sharma Covert, MD   200 mg at 04/28/20 2116  . magnesium hydroxide (MILK OF MAGNESIA) suspension 30 mL  30 mL Oral Daily PRN Sharma Covert, MD      . nicotine (NICODERM CQ - dosed in mg/24 hours) patch 21 mg  21 mg Transdermal Daily [name], [name], NP   21 mg at 04/29/20 0830  . traZODone (DESYREL) tablet 50 mg  50 mg Oral QHS PRN Sharma Covert, MD        Lab Results: No results found for this or any previous visit (from the past 48 hour(s)).  Blood Alcohol level:  Lab Results  Component Value Date   ETH <10 03/55/9741    Metabolic Disorder Labs: Lab Results  Component Value Date   HGBA1C 5.1 04/21/2020   MPG 99.67 04/21/2020   No results found for: PROLACTIN Lab Results  Component Value Date   CHOL 150 04/21/2020   TRIG 71 04/21/2020   HDL 47 04/21/2020   CHOLHDL 3.2 04/21/2020   VLDL 14 04/21/2020   LDLCALC 89 04/21/2020    Physical Findings: AIMS: Facial and Oral Movements Muscles of Facial Expression: None, normal Lips and Perioral Area: None, normal Jaw: None, normal Tongue: None, normal,Extremity Movements Upper (arms, wrists, hands, fingers): None, normal Lower (legs, knees, ankles, toes): None, normal, Trunk Movements Neck, shoulders, hips: None, normal, Overall Severity Severity of abnormal movements (highest score from questions above): None, normal Incapacitation due to abnormal movements: None, normal Patient's awareness of abnormal movements (rate only patient's report): No Awareness, Dental Status Current problems with teeth and/or dentures?: No Does patient usually wear dentures?: No  CIWA:  CIWA-Ar Total: 1 COWS:     Musculoskeletal: Strength & Muscle Tone: within normal limits Gait & Station: normal Patient leans: N/A  Psychiatric Specialty Exam: Physical Exam  Review of  Systemsdoes not endorse physical symptoms and presents comfortable , in no acute distress   Blood pressure 110/78, pulse 81, temperature 97.6 F (36.4 C), temperature source Oral, resp. rate 16, height 5' 11.5" (1.816 m), weight 88.9 kg, SpO2 100 %.Body mass index is 26.96 kg/m.  General Appearance: Well Groomed  Eye Contact:  Good  Speech:  Normal Rate  Volume:  Normal  Mood:  Improving   Affect:  Still cautious, guarded, appears more reactive, less irritable   Thought Process:  Goal Directed  Orientation:  Other:  fully alert and attentive  Thought Content:  does not appear internally preoccupied, no delusions are expressed at  this time  Suicidal Thoughts:  Yes.  without intent/plan today reports he feels safe on unit, denies suicidal plan or intention.   Homicidal Thoughts:  No  Memory:  Immediate;   Good  Judgement:  Fair/ improving  Insight:  Fair  Psychomotor Activity:  Normal  Concentration:  Concentration: Good  Recall:  Good  Fund of Knowledge:  Good  Language:  Good  Akathisia:  NA  Handed:  Right  AIMS (if indicated):     Assets:  Desire for Improvement Social Support  ADL's:  Intact  Cognition:  WNL  Sleep:  Number of Hours: 6.75   Assessment:  Patient is a35 year old male, presented to ED on 6/30 on IVC following overdose by ingesting bleach product , mouthwash and Nexium. Patient has reportedhistory of Bipolar Disorder On admission also endorsed auditory hallucinations described as " chatters". Today presents alert , attentive, calm/without agitation. Remains cautious and refuses to speak with writer beyond answering a few questions , stating he only wants to speak with Dr. Demaris Callander, whom he has developed a therapeutic alliance . He does acknowledge improvement compared to admission, presents with a more reactive affect, and today denies suicidal plan or intention. Tolerating Strattera/Lamictal /Thorazine well thus far .  Treatment Plan Summary: Daily contact with  patient to assess and evaluate symptoms and progress in treatment. Treatment Plan reviewed as below today 7/10 Encourage group and milieu participation Treatment team working on disposition planning options 1. Continue Strattera 36 mg for mood disorder / ADHD symptoms 2. Continue Thorazine 25 mg BID for psychosis. 3. Continue Lamictal 200 mg PO QHS for mood disorder    Adam Campus, MD 04/29/2020, 10:18 AM     Patient ID: Adam Larsen., male   DOB: 12/21/1985, 34 y.o.   MRN: [id number]

## 2020-04-29 NOTE — Progress Notes (Signed)
   04/29/20 2158  COVID-19 Daily Checkoff  Have you had a fever (temp > 37.80C/100F)  in the past 24 hours?  No  If you have had runny nose, nasal congestion, sneezing in the past 24 hours, has it worsened? No  COVID-19 EXPOSURE  Have you traveled outside the state in the past 14 days? No  Have you been in contact with someone with a confirmed diagnosis of COVID-19 or PUI in the past 14 days without wearing appropriate PPE? No  Have you been living in the same home as a person with confirmed diagnosis of COVID-19 or a PUI (household contact)? No  Have you been diagnosed with COVID-19? No

## 2020-04-30 MED ORDER — CHLORPROMAZINE HCL 25 MG PO TABS: 25.0000 mg | ORAL_TABLET | Freq: Two times a day (BID) | ORAL | 0 refills | Status: DC

## 2020-04-30 MED ORDER — NICOTINE 21 MG/24HR TD PT24: 21.0000 mg | MEDICATED_PATCH | Freq: Every day | TRANSDERMAL | 0 refills | Status: AC

## 2020-04-30 MED ORDER — LAMOTRIGINE 200 MG PO TABS: 200.0000 mg | ORAL_TABLET | Freq: Every day | ORAL | 0 refills | Status: DC

## 2020-04-30 MED ORDER — ATOMOXETINE HCL 18 MG PO CAPS: 36.0000 mg | ORAL_CAPSULE | Freq: Every day | ORAL | 0 refills | Status: DC

## 2020-04-30 NOTE — Progress Notes (Signed)
Pt discharged to lobby. Pt was stable and appreciative at that time. All papers and electronic prescriptions were given- no valuables to be returned, only belongings from bedside. Verbal understanding expressed. Denies SI/HI and A/VH. Pt given opportunity to express concerns and ask questions.

## 2020-04-30 NOTE — Progress Notes (Signed)
  [facility] Adult Case Management Discharge Plan :  Will you be returning to the same living situation after discharge:  Yes,  with parents At discharge, do you have transportation home?: Yes,  parents Do you have the ability to pay for your medications: Yes,  denies barriers  Release of information consent forms completed and patient refused to sign them.  Patient to Follow up at:  Follow-up Information    [facility] Follow up on 05/16/2020.   Specialty: Behavioral Health Why: You have an appointment on 05/16/20 at 3:00 pm.  This will be a Virtual appointment.  A referral has been made for therapy services with this provider as well.  At the request of doctor, a referral to Partial Hospitalization Program is being made also. Contact information: [address] [place] [address] [phone]              Next level of care provider has access to [facility] Link:yes  Safety Planning and Suicide Prevention discussed: No.  Patient adamantly refused  Have you used any form of tobacco in the last 30 days? (Cigarettes, Smokeless Tobacco, Cigars, and/or Pipes): Yes  Has patient been referred to the Quitline?: Patient refused referral  Patient has been referred for addiction treatment: Yes  Lynnell Chad, LCSW 04/30/2020, 1:27 PM

## 2020-04-30 NOTE — [facility] Group Notes (Signed)
Adult Psychoeducational Group Note  Date:  04/30/2020 Time:  12:45 PM  Group Topic/Focus:  Group Topic/Focus: PROGRESSIVE RELAXATION. A group where deep breathing is taught and tensing and relaxation muscle groups is used. Imagery is used as well.  Pts are asked to imagine 3 pillars that hold them up when they are not able to hold themselves up.   Participation Level:  Active  Participation Quality:  Appropriate  Affect:  Appropriate  Cognitive:  Oriented  Insight: Improving  Engagement in Group:  Engaged  Modes of Intervention:  Role-play  Additional Comments:  Pt states what holds him up is his family and his dog  Adam Larsen 04/30/2020, 12:45 PM

## 2020-04-30 NOTE — [facility] Suicide Risk Assessment (Signed)
[facility] INPATIENT:  Family/Significant Other Suicide Prevention Education  Suicide Prevention Education:  Patient Refusal for Family/Significant Other Suicide Prevention Education: The patient Adam Larsen. has refused to provide written consent for family/significant other to be provided Family/Significant Other Suicide Prevention Education during admission and/or prior to discharge.  Physician notified.  Carloyn Jaeger Grossman-Orr 04/30/2020, 10:43 AM

## 2020-04-30 NOTE — Discharge Summary (Addendum)
Physician Discharge Summary Note  Patient:  Adam CanterburyRichard Benavidez Jr. is an 34 y.o., male MRN:  [id number] DOB:  May 26, 1986 Patient phone:  [phone] (home)  Patient address:   [address] [place] [id number],  Total Time spent with patient: 30 minutes  Date of Admission:  04/19/2020 Date of Discharge: 04/30/2020  Reason for Admission: Patient was brought to ED after his suicidal attempt by overdosing on OxyClean (1.5 cups), Downy (1.5 cups), fragrance spray and Nexium (14tablets). He was involuntarily committed feeling hopeless, depressed and suicidal and was admitted to inpatient unit for further evaluation and stabilization.    Today patient's mood is better, reports no suicidal/homicial ideation. He denies any auditory/ visual hallucinations and reports he thinks "overall he made lots of progress". He is happy about leaving for home and plan to start seeing therapist and psychiatrist outside.  Principal Problem: <principal problem not specified> Discharge Diagnoses: Active Problems:   Bipolar 2 disorder (HCC)   Bipolar disorder (HCC)   Past Psychiatric History:  Patient is a 34 year old single malewitha long hx of anxiety, depression, problems with focusing, concentration, task completion. He has been treated for depression/anxiety and ADD in the past. He has a hx of having SI and admits to one suicidal attempt by cutting in August 2019. He admits to a hx of brief 2-3 day long episodes on increased energy, irritability, racing thoughts, hyperactivity, decreased need for sleep. He also remembers having at times paranoid fears while in a hypomanic episode. 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 His depressive episodes are typically much longer and caused much more occupational and social dysfunction.HisAbilify was changed to Latudafor bipolar depressionbut he did not respond to this medication. He also tried 50 mg of quetiapine but  hecould nottolerate it - sedated, tired the next day.He was thenstarted on Rexulti for augmentation of duloxetine which he tolerated well and reported clear improvement in moodbut has been gradually gaining weight!Also, started methylphenidate xr initially 20 mg daily, then changed toConcertanow at 54 mg strength. He reported much improvement in his focusing abilitybut feels "like crashing in early afternoon).Hestarted to feelvery paranoid and depressedwith rapid mood swings in late November- his mother called the psychiatrist office and [facility]. He however could not tolerate it - reported feeling dizzy and having "chest tightness". Then ziprasidone was stopped and started 1.5 mg of cariprazine which he toleratedwell and noticed his mood to beginning to stabilize (he admits to having mildparanoiabut is able to use cognitive skills - knows that fears are unjustified). Even though it has been approved by his insurance, price is prohibitive so he has been getting  samples.His mood has stabilized but he wantedto get off as many medications (cariprazoine, duloxetine)as possible while keeping Concerta. He eventually decided to stay onLamictal. He noticed decline in mood, increased anxiety/paranoia. His father started giving him 2 mg of Rexulti which he had from before.  Past Medication history from 01/09/2019. Some medications are from notes.  Atypical Antipsychotics:  1.Aripiprazole (Abilify) 15 mg 2. Lurasidone ( Latuda) 40 mg 3. Quetiapine (Seroquel) 50 mg 4. Risperidone (Risperdal) 2 mg 5. Ziprasidone ( Geodon) 60 mg 6. Cariprazine ( Vraylar) 3 mg PO 7. Brexpiprazole ( Rexulti) 2 mg  Psychostimulants:  1.  Methylphenidate long acting (Concerta 54 mg 2.  Methylphenidate (Ritalin)  Mood Stabilizers:  1. Lamotrigine ( Lamictal ) 300 mg   Anxiolytics:  1. Alprazolam 0.5 mg  Antidepressants:  SNRI's  1.  Duloxetine ( Cymbalta) 90 mg PO  Atypical antidepressant  1. Bupropion ( Wellbutrin)  SSRI's  1. Venlafaxine ( Effexor)  Past Medical History:  Past Medical History:  Diagnosis Date  . Anxiety   . Bipolar 1 disorder (HCC)   . Depression   . Diabetes mellitus without complication (HCC)   . Mania (HCC)   . Suicidal ideations     Past Surgical History:  Procedure Laterality Date  . WISDOM TOOTH EXTRACTION     Family History:  Family History  Problem Relation Age of Onset  . Schizophrenia Paternal Grandmother    Family Psychiatric  History: Schizophrenia in paternal grandmother Social History:  Social History   Substance and Sexual Activity  Alcohol Use Never     Social History   Substance and Sexual Activity  Drug Use Never    Social History   Socioeconomic History  . Marital status: Single    Spouse name: Not on file  . Number of children: 0  . Years of education: 10 or 37  . Highest education level: 11th grade  Occupational History  . Not on file  Tobacco Use  . Smoking status: Current Every Day Smoker    Years: 10.00    Types: E-cigarettes  . Smokeless tobacco: Never Used  Vaping Use  . Vaping Use: Every day  . Substances: Nicotine, Flavoring, Nicotine-salt  Substance and Sexual Activity  . Alcohol use: Never  . Drug use: Never  . Sexual activity: Not Currently  Other Topics Concern  . Not on file  Social History Narrative   Pt lives with his parents. States he has been diagnosed with ADHD, dyslexia, dysgraphia and learning disabilities. Finished 10 th or 11 th grade but is working on his GED through an online high school program. Pt is currently unemployed.   Social Determinants of Health   Financial Resource Strain:   . Difficulty of Paying Living Expenses:   Food Insecurity:   . Worried About Programme researcher, broadcasting/film/video in the Last Year:   . Barista in the Last Year:   Transportation Needs:   . Freight forwarder (Medical):   Mar[place] Kitchen  Lack of Transportation (Non-Medical):   Physical Activity:   . Days of Exercise per Week:   . Minutes of Exercise per Session:   Stress:   . Feeling of Stress :   Social Connections:   . Frequency of Communication with Friends and Family:   . Frequency of Social Gatherings with Friends and Family:   . Attends Religious Services:   . Active Member of Clubs or Organizations:   . Attends Banker Meetings:   Marland Kitchen Marital Status:     Hospital Course:  Patient was admitted with suicidal ideation with plan, feeling depressed and auditory hallucinations "chatters".  He was started on Lamictal 150 mg for mood stabilization, vistaril 25 mg PRN for anxiety and trazodone 50 mg.  Also, added 1 mg BID PO daily for his psychotic symptoms.   After that Strattera was started for his mood stabilization and Haldol increased to 2 mg BID. He refused Haldol, Rexulti. We increased Lamictal to 200 mg for mood stabilization. Also, started Thorazine 25 mg TID to address his chatters.  He refused Thorazine 25 mgTID.  Now, we have on Lamictal 200 mg for mood stabilization, Thorazine 25 mg BID for Auditory hallucinations and Strattera 36 mg PO daily for mood stabilization.  Physical Findings: AIMS: Facial and Oral Movements Muscles of Facial Expression: None, normal Lips and Perioral Area: None, normal Jaw: None, normal Tongue: None, normal,Extremity Movements Upper (arms, wrists, hands, fingers): None, normal Lower (legs, knees, ankles, toes): None, normal, Trunk Movements Neck, shoulders, hips: None, normal, Overall Severity Severity of abnormal movements (highest score from questions above): None, normal Incapacitation due to abnormal movements: None, normal Patient's awareness of abnormal movements (rate only patient's report): No Awareness, Dental Status Current problems with teeth and/or dentures?: No Does patient usually wear dentures?: No  CIWA:  CIWA-Ar Total: 1 COWS:      Musculoskeletal: Strength & Muscle Tone: within normal limits Gait & Station: normal Patient leans: N/A  Psychiatric Specialty Exam: Physical Exam  Review of Systems  Blood pressure 111/80, pulse 83, temperature 98.2 F (36.8 C), temperature source Oral, resp. rate 20, height 5' 11.5" (1.816 m), weight 88.9 kg, SpO2 100 %.Body mass index is 26.96 kg/m.  General Appearance: Casual  Eye Contact:  Good  Speech:  Normal Rate  Volume:  Normal  Mood:  Good  Affect:  Appropriate  Thought Process:  Coherent  Orientation:  Full (Time, Place, and Person)  Thought Content:  WDL  Suicidal Thoughts:  No  Homicidal Thoughts:  No  Memory:  Immediate;   Good  Judgement:  Good  Insight:  Good  Psychomotor Activity:  Normal  Concentration:  Concentration: Good  Recall:  Good  Fund of Knowledge:  Fair  Language:  Good  Akathisia:  NA  Handed:  Right  AIMS (if indicated):     Assets:  Desire for Improvement Social Support  ADL's:  Intact  Cognition:  WNL  Sleep:  Number of Hours: 6     Have you used any form of tobacco in the last 30 days? (Cigarettes, Smokeless Tobacco, Cigars, and/or Pipes): Yes  Has this patient used any form of tobacco in the last 30 days? (Cigarettes, Smokeless Tobacco, Cigars, and/or Pipes) Yes  Blood Alcohol level:  Lab Results  Component Value Date   ETH <10 04/19/2020    Metabolic Disorder Labs:  Lab Results  Component Value Date   HGBA1C 5.1 04/21/2020   MPG 99.67 04/21/2020   No results found for: PROLACTIN Lab Results  Component Value Date   CHOL 150 04/21/2020   TRIG 71 04/21/2020   HDL 47 04/21/2020   CHOLHDL 3.2 04/21/2020   VLDL 14 04/21/2020   LDLCALC 89 04/21/2020    See Psychiatric Specialty Exam and Suicide Risk Assessment completed by Attending Physician prior to discharge.  Discharge destination:  Home  Is patient on multiple antipsychotic therapies at discharge:  No   Has Patient had three or more failed trials of  antipsychotic monotherapy by history:  Yes,   Antipsychotic medications that previously failed include:   1.  Aripiprazole (Abilify) 15 mg., 2.  Lurasidone 40 mg. and 3.  Quietiapine 50 mg. 4. Risperidone (Risperdal) 2 mg, 5. Ziprasidone ( Geodon) 60 mg, 6. Cariprazine ( Vraylar) 3 mg PO, 7. Brexpiprazole ( Rexulti) 2 mg  Recommended Plan for Multiple Antipsychotic Therapies: NA   Allergies as of 04/30/2020   No Known Allergies     Medication List    STOP taking these medications   ALPRAZolam 0.5 MG tablet Commonly known as: Xanax   brexpiprazole 1 MG Tabs tablet Commonly known as: REXULTI   metFORMIN 500 MG tablet Commonly known as: Glucophage   methylphenidate 40 MG 24 hr capsule Commonly known as: RITALIN LA  methylphenidate 54 MG CR tablet Commonly known as: CONCERTA     TAKE these medications     Indication  atomoxetine 18 MG capsule Commonly known as: STRATTERA Take 2 capsules (36 mg total) by mouth daily for 7 days. Start taking on: May 01, 2020  Indication: Attention Deficit Hyperactivity Disorder   chlorproMAZINE 25 MG tablet Commonly known as: THORAZINE Take 1 tablet (25 mg total) by mouth 2 (two) times daily for 7 days.  Indication: Manic-Depression, Psychosis   cholecalciferol 25 MCG (1000 UNIT) tablet Commonly known as: VITAMIN D3 Take 2,000 Units by mouth daily.  Indication: Vitamin D Deficiency   Fish Oil 1000 MG Caps Take 1,000 mg by mouth daily.  Indication: Disorder in Nutrition   lamoTRIgine 200 MG tablet Commonly known as: LAMICTAL Take 1 tablet (200 mg total) by mouth at bedtime for 7 days. What changed:   medication strength  how much to take  Indication: Manic-Depression   multivitamin capsule Take 1 capsule by mouth daily.  Indication: Nutritional Support   nicotine 21 mg/24hr patch Commonly known as: NICODERM CQ - dosed in mg/24 hours Place 1 patch (21 mg total) onto the skin daily for 7 days. Start taking on: May 01, 2020  Indication: Nicotine Addiction   vitamin C 1000 MG tablet Take 2,000 mg by mouth daily.  Indication: Inadequate Vitamin C       Follow-up Information    [facility] Follow up on 05/16/2020.   Specialty: Behavioral Health Why: You have an appointment on 05/16/20 at 3:00 pm.  This will be a Virtual appointment.  A referral has been made for therapy services with this provider as well.  At the request of doctor, a referral to Partial Hospitalization Program is being made also. Contact information: [address] [place] [address] [phone]              Follow-up recommendations:  Activity:  Normal\ Diet:  Normal  Comments:  Patient mood looks better today and he does not report any suicidal ideation. He is tolerating his medications well and plan to continue taking them. He is seen smiling, making jokes as compare to being alone and guarded, suspicious when was admitted to the unit.  Medications:  1. Continue Thorazine 25 mg BID for psychosis. 2. Continue Strattera 36 mg for mood stabilization and concentration. 3. Continue Lamictal 200 mg for mood stabilization.  Signed: Arnoldo Lenis, MD 04/30/2020, 1:29 PM   Patient seen, Suicide Assessment Completed.  Disposition Plan Reviewed

## 2020-04-30 NOTE — [facility]HH Suicide Risk Assessment (Signed)
[facility] Discharge Suicide Risk Assessment   Principal Problem:  Bipolar Disorder by history  Discharge Diagnoses: Active Problems:   Bipolar 2 disorder (HCC)   Bipolar disorder ([place])   Total Time spent with patient: 30 minutes  Musculoskeletal: Strength & Muscle Tone: within normal limits Gait & Station: normal Patient leans: N/A  Psychiatric Specialty Exam: Review of Systems denies lightheadedness or dizziness, denies chest pain, no shortness of breath, no vomiting   Blood pressure 111/80, pulse 83, temperature 98.2 F (36.8 C), temperature source Oral, resp. rate 20, height 5' 11.5" (1.816 m), weight 88.9 kg, SpO2 100 %.Body mass index is 26.96 kg/m.  General Appearance: casual , improved grooming   Eye Contact::  Fair/ improving  Speech:  Normal Rate  Volume:  Normal  Mood:  acknowledges improved mood , today reports mood as " level"   Affect:  noted to be more reactive, less blunted, less irritable   Thought Process:  Linear and Descriptions of Associations: Intact  Orientation:  Other:  fully alert and attentive  Thought Content:  no hallucinations endorsed and does not appaer internally preoccupied, no delusions are expressed at this time  Suicidal Thoughts:  No at this time denies suicidal ideations, denies homicidal or violent ideations   Homicidal Thoughts:  No  Memory:  recent and remote grossly intact   Judgement:  Fair/ improving  Insight:  fair/ improving  Psychomotor Activity:  Normal- no psychomotor agitation or restlessness   Concentration:  Good  Recall:  Good  Fund of Knowledge:Good  Language: Good  Akathisia:  Negative  Handed:  Right  AIMS (if indicated):     Assets:  Desire for Improvement Resilience  Sleep:  Number of Hours: 6  Cognition: WNL  ADL's:  Intact   Mental Status Per Nursing Assessment::   On Admission:  Suicidal ideation indicated by patient  Demographic Factors:  33 y old single male, resides with parents   Loss Factors: No  specific stressors identified by patient - parents report they feel lack of daily structured activities has been an issue   Historical Factors: Has been diagnosed with Bipolar Disorder, history of prior outpatient psychiatric management with Dr. Montel Culver. Has been on several different psychiatric medications/antipsychotics in the past    Risk Reduction Factors:   Living with another person, especially a relative, Positive social support and Positive coping skills or problem solving skills  Continued Clinical Symptoms:  Today patient presents alert, calm without psychomotor agitation, eye contact appears improved. I met with him along with Dr. Demaris Callander.  At this time he endorses improving mood and acknowledges feeling significantly better than he did on admission.  He denies suicidal ideations or self-injurious thoughts at this time.  He denies hallucinations and does not appear internally preoccupied.  No delusions are expressed. The behavior on unit has been calm and has not exhibited agitated or disruptive behaviors. He denies medication side effects (currently on Strattera, Thorazine, Lamotrigine).  Side effects have been reviewed. With patient's expressed consent Dr. Demaris Callander and myself have spoken with his parents, with whom he lives.  They report patient is doing better and had noted improvement compared to his admission status.  They agree with discharge today.  They report they have removed firearms/potentially dangerous implements from home in anticipation of his return. Parents are hoping that patient will participate in [facility] following discharge.  Currently patient is in agreement with this.  Cognitive Features That Contribute To Risk:  No gross cognitive deficits noted upon discharge. Is  alert , attentive, and oriented x 3   Suicide Risk:  Mild:  Suicidal ideation of limited frequency, intensity, duration, and specificity.  There are no identifiable plans, no associated intent, mild  dysphoria and related symptoms, good self-control (both objective and subjective assessment), few other risk factors, and identifiable protective factors, including available and accessible social support.   Follow-up Information    [facility] Follow up on 05/16/2020.   Specialty: Behavioral Health Why: You have an appointment on 05/16/20 at 3:00 pm.  This will be a Virtual appointment.  A referral has been made for therapy services with this provider as well.  At the request of doctor, a referral to Partial Hospitalization Program is being made also. Contact information: [place] [phone]              Plan Of Care/Follow-up recommendations:  Activity:  As tolerated Diet:  Regular Tests:  NA Other:  See below  Patient is expressing improvement and readiness for discharge.  Parents corroborate improvement and agree with discharge today.  He plans to return home.  Parents will be picking him up later today. Plans to follow-up at [place] outpatient services where he has an established psychiatrist.  Also referred to [facility].  Jenne Campus, MD 04/30/2020, 12:36 PM

## 2020-04-30 NOTE — Progress Notes (Signed)
   04/30/20 1100  Psych Admission Type (Psych Patients Only)  Admission Status Involuntary  Psychosocial Assessment  Patient Complaints None  Eye Contact Brief  Facial Expression Flat  Affect Appropriate to circumstance  Speech Logical/coherent  Interaction Minimal  Motor Activity Other (Comment) (WDL)  Appearance/Hygiene Unremarkable  Behavior Characteristics Cooperative;Guarded  Mood Depressed  Aggressive Behavior  Effect No apparent injury  Thought Process  Coherency WDL  Content WDL  Delusions None reported or observed  Perception WDL  Hallucination None reported or observed  Judgment Poor  Confusion None  Danger to Self  Current suicidal ideation? Denies  Self-Injurious Behavior No self-injurious ideation or behavior indicators observed or expressed   Agreement Not to Harm Self Yes  Description of Agreement verbally contracts for safety  Danger to Others  Danger to Others None reported or observed

## 2020-05-01 ENCOUNTER — Telehealth (HOSPITAL_COMMUNITY): Payer: Self-pay | Admitting: Professional

## 2020-05-01 NOTE — Telephone Encounter (Signed)
Cln rtn call. Cln orients pt's father to [facility]. Pt's father is concerned pt will not due PHP virtually due to paranoia related to technology. Father reports he has called [facility], is awaiting return call, and will call [facility] and [facility]. Pt will call if wants to schedule appt.

## 2020-05-04 ENCOUNTER — Encounter: Payer: Self-pay | Admitting: Adult Health

## 2020-05-04 ENCOUNTER — Ambulatory Visit (INDEPENDENT_AMBULATORY_CARE_PROVIDER_SITE_OTHER): Payer: 59 | Admitting: Adult Health

## 2020-05-04 ENCOUNTER — Other Ambulatory Visit: Payer: Self-pay

## 2020-05-04 VITALS — BP 142/95 | HR 98 | Ht 71.0 in | Wt 199.0 lb

## 2020-05-04 DIAGNOSIS — F411 Generalized anxiety disorder: Secondary | ICD-10-CM | POA: Diagnosis not present

## 2020-05-04 DIAGNOSIS — F900 Attention-deficit hyperactivity disorder, predominantly inattentive type: Secondary | ICD-10-CM

## 2020-05-04 DIAGNOSIS — F319 Bipolar disorder, unspecified: Secondary | ICD-10-CM | POA: Diagnosis not present

## 2020-05-04 MED ORDER — ATOMOXETINE HCL 18 MG PO CAPS: 36.0000 mg | ORAL_CAPSULE | Freq: Every day | ORAL | 2 refills | Status: DC

## 2020-05-04 MED ORDER — LAMOTRIGINE 200 MG PO TABS: 200.0000 mg | ORAL_TABLET | Freq: Every day | ORAL | 2 refills | Status: DC

## 2020-05-04 MED ORDER — CHLORPROMAZINE HCL 25 MG PO TABS: 25.0000 mg | ORAL_TABLET | Freq: Two times a day (BID) | ORAL | 2 refills | Status: DC
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
## 2020-05-05 ENCOUNTER — Telehealth: Payer: Self-pay | Admitting: Adult Health

## 2020-05-05 ENCOUNTER — Other Ambulatory Visit: Payer: Self-pay

## 2020-05-05 DIAGNOSIS — F900 Attention-deficit hyperactivity disorder, predominantly inattentive type: Secondary | ICD-10-CM

## 2020-05-05 MED ORDER — ATOMOXETINE HCL 18 MG PO CAPS: 36.0000 mg | ORAL_CAPSULE | Freq: Every day | ORAL | 2 refills | Status: DC

## 2020-05-05 NOTE — Telephone Encounter (Signed)
Ricks parents called saying that they couldn't get his Strattera prescription filled.  Apparently the pharmacy is saying is can't be fill at this time.  It is too early to fill.  He was given 14 tablets when he left the hospital, but because he is to take 2/day that is only a 7 day supply but the pharmacy won't fill.  Please call [facility] and approve filling the medication.

## 2020-05-08 NOTE — Telephone Encounter (Signed)
Noted  

## 2020-05-10 ENCOUNTER — Telehealth: Payer: Self-pay | Admitting: Adult Health

## 2020-05-10 NOTE — Telephone Encounter (Signed)
Pt left message reporting insurance doesn't cover Vraylar. Look into something else. Also, would like recommendation on a psychologist. Contact # [phone]

## 2020-05-10 NOTE — Telephone Encounter (Signed)
Let me submit a prior authorization first.

## 2020-05-10 NOTE — Telephone Encounter (Signed)
Prior authorization submitted with [employer], pending determination.

## 2020-05-12 ENCOUNTER — Other Ambulatory Visit: Payer: Self-pay

## 2020-05-12 ENCOUNTER — Telehealth: Payer: Self-pay

## 2020-05-12 MED ORDER — CARIPRAZINE HCL 3 MG PO CAPS: 3.0000 mg | ORAL_CAPSULE | Freq: Every day | ORAL | 1 refills | Status: DC

## 2020-05-12 NOTE — Telephone Encounter (Signed)
Prior authorization submitted through cover my cover my meds for VRAYLAR 3 MG CAPSULES approved after records sent to [employer] [phone] p, [phone] f. Effective 05/11/2020-05/11/2021   OJ#[id number] [employer] Member

## 2020-05-12 NOTE — Telephone Encounter (Signed)
Pt requesting out pt clinic and psychologist recommendation. [phone]

## 2020-05-12 NOTE — Telephone Encounter (Signed)
Noted  

## 2020-05-12 NOTE — Telephone Encounter (Signed)
Requesting name of psychologist?   Patient prior authorization for Vraylar 3 mg was approved effective 05/11/2020-05/12/2023

## 2020-05-15 ENCOUNTER — Telehealth: Payer: Self-pay | Admitting: Adult Health

## 2020-05-15 NOTE — Telephone Encounter (Signed)
Adam Larsen called back today to inquire about recommendations on seeing a psychologist. I told him we could set him up with someone here but we are scheduled out about 4-5 wks. He didn't really want to wait that long so I gave him referrals to [facility] and [facility] on [address].  I told him that if neither worked out to let us know and we would be happy to get him scheduled.

## 2020-05-15 NOTE — Telephone Encounter (Signed)
We can set him up to see someone here, if we have availability. If not, he can call his insurance carrier for a referral.

## 2020-05-15 NOTE — Telephone Encounter (Signed)
Noted  

## 2020-05-15 NOTE — Telephone Encounter (Signed)
Pt called asking how to taper off chlorpromazine and into vraylar. Call back # [phone]

## 2020-05-16 ENCOUNTER — Other Ambulatory Visit: Payer: Self-pay

## 2020-05-16 ENCOUNTER — Telehealth: Payer: Self-pay | Admitting: Adult Health

## 2020-05-16 ENCOUNTER — Encounter (HOSPITAL_COMMUNITY): Payer: Self-pay | Admitting: Emergency Medicine

## 2020-05-16 ENCOUNTER — Telehealth (HOSPITAL_COMMUNITY): Payer: 59 | Admitting: Psychiatry

## 2020-05-16 ENCOUNTER — Emergency Department (HOSPITAL_COMMUNITY)
Admission: EM | Admit: 2020-05-16 | Discharge: 2020-05-17 | Disposition: A | Discharge status: Other Acute Inpatient Hospital | Payer: 59 | Attending: Emergency Medicine | Admitting: Emergency Medicine

## 2020-05-16 DIAGNOSIS — Y999 Unspecified external cause status: Secondary | ICD-10-CM | POA: Diagnosis not present

## 2020-05-16 DIAGNOSIS — X58XXXA Exposure to other specified factors, initial encounter: Secondary | ICD-10-CM | POA: Diagnosis not present

## 2020-05-16 DIAGNOSIS — Y929 Unspecified place or not applicable: Secondary | ICD-10-CM | POA: Diagnosis not present

## 2020-05-16 DIAGNOSIS — Z20822 Contact with and (suspected) exposure to covid-19: Secondary | ICD-10-CM | POA: Diagnosis not present

## 2020-05-16 DIAGNOSIS — E119 Type 2 diabetes mellitus without complications: Secondary | ICD-10-CM | POA: Diagnosis not present

## 2020-05-16 DIAGNOSIS — T43501A Poisoning by unspecified antipsychotics and neuroleptics, accidental (unintentional), initial encounter: Secondary | ICD-10-CM | POA: Diagnosis not present

## 2020-05-16 DIAGNOSIS — Y939 Activity, unspecified: Secondary | ICD-10-CM | POA: Insufficient documentation

## 2020-05-16 DIAGNOSIS — T1491XA Suicide attempt, initial encounter: Secondary | ICD-10-CM | POA: Diagnosis present

## 2020-05-16 DIAGNOSIS — Z79899 Other long term (current) drug therapy: Secondary | ICD-10-CM | POA: Diagnosis not present

## 2020-05-16 DIAGNOSIS — F17210 Nicotine dependence, cigarettes, uncomplicated: Secondary | ICD-10-CM | POA: Insufficient documentation

## 2020-05-16 LAB — COMPREHENSIVE METABOLIC PANEL
ALT: 27 U/L (ref 0–44)
AST: 20 U/L (ref 15–41)
Albumin: 4.3 g/dL (ref 3.5–5.0)
Alkaline Phosphatase: 73 U/L (ref 38–126)
Anion gap: 11 (ref 5–15)
BUN: 11 mg/dL (ref 6–20)
CO2: 24 mmol/L (ref 22–32)
Calcium: 9.6 mg/dL (ref 8.9–10.3)
Chloride: 105 mmol/L (ref 98–111)
Creatinine, Ser: 1.05 mg/dL (ref 0.61–1.24)
GFR calc Af Amer: >60 mL/min (ref >60)
GFR calc non Af Amer: >60 mL/min (ref >60)
Glucose, Bld: 202 mg/dL — ABNORMAL HIGH (ref 70–99)
Potassium: 3.6 mmol/L (ref 3.5–5.1)
Sodium: 140 mmol/L (ref 135–145)
Total Bilirubin: 0.7 mg/dL (ref 0.3–1.2)
Total Protein: 7.5 g/dL (ref 6.5–8.1)

## 2020-05-16 LAB — CBC
HCT: 45.6 % (ref 39.0–52.0)
Hemoglobin: 15.3 g/dL (ref 13.0–17.0)
MCH: 29.9 pg (ref 26.0–34.0)
MCHC: 33.6 g/dL (ref 30.0–36.0)
MCV: 89.1 fL (ref 80.0–100.0)
Platelets: 392 10*3/uL (ref 150–400)
RBC: 5.12 MIL/uL (ref 4.22–5.81)
RDW: 12.7 % (ref 11.5–15.5)
WBC: 12.5 10*3/uL — ABNORMAL HIGH (ref 4.0–10.5)
nRBC: 0 % (ref 0.0–0.2)

## 2020-05-16 LAB — ACETAMINOPHEN LEVEL: Acetaminophen (Tylenol), Serum: <10 ug/mL — ABNORMAL LOW (ref 10–30)

## 2020-05-16 LAB — SALICYLATE LEVEL: Salicylate Lvl: <7 mg/dL — ABNORMAL LOW (ref 7.0–30.0)

## 2020-05-16 LAB — RAPID URINE DRUG SCREEN, HOSP PERFORMED
Amphetamines: NOT DETECTED
Barbiturates: NOT DETECTED
Benzodiazepines: NOT DETECTED
Cocaine: NOT DETECTED
Opiates: NOT DETECTED
Tetrahydrocannabinol: NOT DETECTED

## 2020-05-16 LAB — CK: Total CK: 71 U/L (ref 49–397)

## 2020-05-16 LAB — SARS CORONAVIRUS 2 BY RT PCR (DIASORIN): SARS Coronavirus 2: NEGATIVE

## 2020-05-16 LAB — ETHANOL: Alcohol, Ethyl (B): <10 mg/dL (ref <10)
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
## 2020-05-16 NOTE — ED Notes (Signed)
Food ordered to [employer], parents updated about POC, all pt's belongings returned to his parents, pt able to make one phone call to parents, pt on bed, sitter at the bedside, all monitor cords removed from the room.

## 2020-05-16 NOTE — Care Management (Signed)
Writer referred for inpatient hospitalization.

## 2020-05-16 NOTE — Telephone Encounter (Signed)
Noted  

## 2020-05-16 NOTE — Telephone Encounter (Signed)
Pt is @ hospital now. Tried to commit suicide this AM. Swallowed 1 bottle of VRAYLAR. Stanton Kidney his mother left message.

## 2020-05-16 NOTE — ED Triage Notes (Signed)
Pt states at 12am last night he took 30 tablets of Vraylar in an attempted to hurt himself. Pt currently states he just feels tired.

## 2020-05-16 NOTE — ED Notes (Signed)
Called Poison Control: advised to get liver enzymes and full metabolic panel, CK level and acetaminophen. monitor for CNS depressants , possible liver enzyme elevated and needs at least 8 hours of observation or until back to baseline.

## 2020-05-16 NOTE — ED Notes (Signed)
Spoke to [place] from [employer]. She asked for EKG results. Was not able to find results and completed an EKG, print out went to Dr. Wilkie Aye.   Called Poison Control back and spoke to another person. They stated to contact them if the MD had any problems with the EKG; otherwise there is no reason to call back

## 2020-05-16 NOTE — ED Provider Notes (Addendum)
[facility] EMERGENCY DEPARTMENT Provider Note   CSN: [id number] Arrival date & time: 05/16/20  0848     History Chief Complaint  Patient presents with  . Drug Overdose    Adam Larsen. is a 34 y.o. male.  Patient admits to taking 30 tablets of antipsychotic at around midnight, about 12 hours ago.  Patient admits to taking vryalar. No other medications or drugs. Took them to kill himself. No symptoms.    The history is provided by the patient.  Drug Overdose This is a new problem. The problem has not changed since onset.Pertinent negatives include no chest pain, no abdominal pain, no headaches and no shortness of breath. Nothing aggravates the symptoms. Nothing relieves the symptoms. He has tried nothing for the symptoms. The treatment provided no relief.       Past Medical History:  Diagnosis Date  . Anxiety   . Bipolar 1 disorder (HCC)   . Depression   . Diabetes mellitus without complication (HCC)   . Mania (HCC)   . Suicidal ideations     Patient Active Problem List   Diagnosis Date Noted  . Bipolar disorder (HCC) 04/20/2020  . Bipolar 2 disorder (HCC) 01/09/2019  . GAD (generalized anxiety disorder) 01/09/2019  . ADHD, predominantly inattentive type 01/09/2019    Past Surgical History:  Procedure Laterality Date  . WISDOM TOOTH EXTRACTION         Family History  Problem Relation Age of Onset  . Schizophrenia Paternal Grandmother     Social History   Tobacco Use  . Smoking status: Current Every Day Smoker    Years: 10.00    Types: E-cigarettes  . Smokeless tobacco: Never Used  Vaping Use  . Vaping Use: Every day  . Substances: Nicotine, Flavoring, Nicotine-salt  Substance Use Topics  . Alcohol use: Never  . Drug use: Never    Home Medications Prior to Admission medications   Medication Sig Start Date End Date Taking? Authorizing Provider  Ascorbic Acid (VITAMIN C) 1000 MG tablet Take 2,000 mg by mouth daily.     [provider]  atomoxetine (STRATTERA) 18 MG capsule Take 2 capsules (36 mg total) by mouth daily. 05/05/20 08/03/20  Mozingo, Thereasa Solo, NP  cariprazine (VRAYLAR) capsule Take 1 capsule (3 mg total) by mouth daily. 05/12/20   Mozingo, Thereasa Solo, NP  chlorproMAZINE (THORAZINE) 25 MG tablet Take 1 tablet (25 mg total) by mouth 2 (two) times daily. 05/04/20 08/02/20  Mozingo, Thereasa Solo, NP  cholecalciferol (VITAMIN D3) 25 MCG (1000 UNIT) tablet Take 2,000 Units by mouth daily.    [provider]  lamoTRIgine (LAMICTAL) 200 MG tablet Take 1 tablet (200 mg total) by mouth at bedtime. 05/04/20 08/02/20  Mozingo, Thereasa Solo, NP  Multiple Vitamin (MULTIVITAMIN) capsule Take 1 capsule by mouth daily.    [provider]  Omega-3 Fatty Acids (FISH OIL) 1000 MG CAPS Take 1,000 mg by mouth daily.    [provider]    Allergies    Patient has no known allergies.  Review of Systems   Review of Systems  Constitutional: Negative for chills and fever.  HENT: Negative for ear pain and sore throat.   Eyes: Negative for pain and visual disturbance.  Respiratory: Negative for cough and shortness of breath.   Cardiovascular: Negative for chest pain and palpitations.  Gastrointestinal: Negative for abdominal pain and vomiting.  Genitourinary: Negative for dysuria and hematuria.  Musculoskeletal: Negative for arthralgias and back pain.  Skin: Negative for color change and rash.  Neurological: Negative for seizures, syncope and headaches.  Psychiatric/Behavioral: Positive for suicidal ideas.  All other systems reviewed and are negative.   Physical Exam Updated Vital Signs  ED Triage Vitals [05/16/20 0854]  Enc Vitals Group     BP (!) 136/85     Pulse Rate (!) 112     Resp 18     Temp 98.4 F (36.9 C)     Temp Source Oral     SpO2 100 %     Weight 200 lb (90.7 kg)     Height 5' 11.25" (1.81 m)     Head Circumference      Peak Flow      Pain  Score      Pain Loc      Pain Edu?      Excl. in GC?     Physical Exam Vitals and nursing note reviewed.  Constitutional:      General: He is not in acute distress.    Appearance: He is well-developed. He is not ill-appearing.  HENT:     Head: Normocephalic and atraumatic.     Nose: Nose normal.     Mouth/Throat:     Mouth: Mucous membranes are moist.  Eyes:     Extraocular Movements: Extraocular movements intact.     Conjunctiva/sclera: Conjunctivae normal.     Pupils: Pupils are equal, round, and reactive to light.  Cardiovascular:     Rate and Rhythm: Normal rate and regular rhythm.     Pulses: Normal pulses.     Heart sounds: Normal heart sounds. No murmur heard.   Pulmonary:     Effort: Pulmonary effort is normal. No respiratory distress.     Breath sounds: Normal breath sounds.  Abdominal:     Palpations: Abdomen is soft.     Tenderness: There is no abdominal tenderness.  Musculoskeletal:     Cervical back: Normal range of motion and neck supple.  Skin:    General: Skin is warm and dry.  Neurological:     Mental Status: He is alert.  Psychiatric:        Thought Content: Thought content includes suicidal ideation. Thought content includes suicidal plan.     ED Results / Procedures / Treatments   Labs (all labs ordered are listed, but only abnormal results are displayed) Labs Reviewed  COMPREHENSIVE METABOLIC PANEL - Abnormal; Notable for the following components:      Result Value   Glucose, Bld 202 (*)    All other components within normal limits  SALICYLATE LEVEL - Abnormal; Notable for the following components:   Salicylate Lvl <7.0 (*)    All other components within normal limits  ACETAMINOPHEN LEVEL - Abnormal; Notable for the following components:   Acetaminophen (Tylenol), Serum <10 (*)    All other components within normal limits  CBC - Abnormal; Notable for the following components:   WBC 12.5 (*)    All other components within normal limits    ETHANOL  RAPID URINE DRUG SCREEN, HOSP PERFORMED  CK    EKG EKG shows sinus rhythm.  No ischemic changes.  Normal intervals.  Radiology No results found.  Procedures Procedures (including critical care time)  Medications Ordered in ED Medications - No data to display  ED Course  I have reviewed the triage vital signs and the nursing notes.  Pertinent labs & imaging results that were available during my care of the patient were reviewed  by me and considered in my medical decision making (see chart for details).    MDM Rules/Calculators/A&P                          Adam Larsen. is a 34 year old male with history of bipolar presents to the ED with suicidal ideation.  Patient took 30 tablets of his antipsychotic, vraylar, at midnight about 12 hours ago.  Normal vitals.  No fever.  Poison control recommended labs, observation for 8 hours or return to baseline.  Currently he has no symptoms.  Lab work showed no significant anemia, electrolyte abnormality.  Liver enzymes normal.  EKG shows sinus rhythm.  Overall it is 12 hours since ingestion.  Patient appears normal with no CNS depression.  Medically cleared at this time.  We will have him evaluated by psychiatry.  IVC has been filled.  This chart was dictated using voice recognition software.  Despite best efforts to proofread,  errors can occur which can change the documentation meaning.     Final Clinical Impression(s) / ED Diagnoses Final diagnoses:  Suicide attempt [facility])    Rx / DC Orders ED Discharge Orders    None       Virgina Norfolk, DO 05/16/20 1233    Virgina Norfolk, DO 05/16/20 1240

## 2020-05-16 NOTE — BH Assessment (Signed)
Called to arrange telehealth assessment.  Patient is in a hall bed and there are no available spaces to assess patient at this point.  RN to contact [facility] when they are able to move patient to a room or other space to be assessed.

## 2020-05-16 NOTE — Telephone Encounter (Signed)
Patient is calling multiple times a week, not sure if you need to move his apt up?   I believe he's asking about going from Thorazine to Vraylar?

## 2020-05-16 NOTE — Telephone Encounter (Signed)
Patient admitted to hospital behavioral health, no further action at this time.

## 2020-05-16 NOTE — Telephone Encounter (Signed)
We can move up his appt if needed.

## 2020-05-17 ENCOUNTER — Other Ambulatory Visit: Payer: Self-pay

## 2020-05-17 ENCOUNTER — Inpatient Hospital Stay (HOSPITAL_COMMUNITY)
Admission: AD | Admit: 2020-05-17 | Discharge: 2020-05-28 | DRG: 885 | Disposition: A | Discharge status: Nursing Home (Includes ICF) | Payer: 59 | Source: Intra-hospital | Attending: Psychiatry | Admitting: Psychiatry

## 2020-05-17 ENCOUNTER — Encounter (HOSPITAL_COMMUNITY): Payer: Self-pay | Admitting: Psychiatry

## 2020-05-17 DIAGNOSIS — F329 Major depressive disorder, single episode, unspecified: Secondary | ICD-10-CM | POA: Diagnosis present

## 2020-05-17 DIAGNOSIS — F3132 Bipolar disorder, current episode depressed, moderate: Secondary | ICD-10-CM | POA: Diagnosis not present

## 2020-05-17 DIAGNOSIS — F332 Major depressive disorder, recurrent severe without psychotic features: Secondary | ICD-10-CM | POA: Diagnosis not present

## 2020-05-17 DIAGNOSIS — F22 Delusional disorders: Secondary | ICD-10-CM | POA: Diagnosis present

## 2020-05-17 DIAGNOSIS — T50992A Poisoning by other drugs, medicaments and biological substances, intentional self-harm, initial encounter: Secondary | ICD-10-CM | POA: Diagnosis present

## 2020-05-17 DIAGNOSIS — Z20822 Contact with and (suspected) exposure to covid-19: Secondary | ICD-10-CM | POA: Diagnosis present

## 2020-05-17 DIAGNOSIS — J301 Allergic rhinitis due to pollen: Secondary | ICD-10-CM | POA: Diagnosis present

## 2020-05-17 DIAGNOSIS — R480 Dyslexia and alexia: Secondary | ICD-10-CM | POA: Diagnosis present

## 2020-05-17 DIAGNOSIS — Z79899 Other long term (current) drug therapy: Secondary | ICD-10-CM | POA: Diagnosis not present

## 2020-05-17 DIAGNOSIS — F331 Major depressive disorder, recurrent, moderate: Secondary | ICD-10-CM | POA: Diagnosis present

## 2020-05-17 DIAGNOSIS — Z818 Family history of other mental and behavioral disorders: Secondary | ICD-10-CM | POA: Diagnosis not present

## 2020-05-17 DIAGNOSIS — T1491XA Suicide attempt, initial encounter: Secondary | ICD-10-CM | POA: Diagnosis not present

## 2020-05-17 DIAGNOSIS — R4587 Impulsiveness: Secondary | ICD-10-CM | POA: Diagnosis present

## 2020-05-17 DIAGNOSIS — R45851 Suicidal ideations: Secondary | ICD-10-CM | POA: Diagnosis present

## 2020-05-17 DIAGNOSIS — F909 Attention-deficit hyperactivity disorder, unspecified type: Secondary | ICD-10-CM | POA: Diagnosis present

## 2020-05-17 DIAGNOSIS — G4700 Insomnia, unspecified: Secondary | ICD-10-CM | POA: Diagnosis present

## 2020-05-17 DIAGNOSIS — F419 Anxiety disorder, unspecified: Secondary | ICD-10-CM | POA: Diagnosis present

## 2020-05-17 DIAGNOSIS — E559 Vitamin D deficiency, unspecified: Secondary | ICD-10-CM | POA: Diagnosis present

## 2020-05-17 DIAGNOSIS — R278 Other lack of coordination: Secondary | ICD-10-CM | POA: Diagnosis present

## 2020-05-17 DIAGNOSIS — F333 Major depressive disorder, recurrent, severe with psychotic symptoms: Secondary | ICD-10-CM | POA: Diagnosis not present

## 2020-05-17 DIAGNOSIS — F17200 Nicotine dependence, unspecified, uncomplicated: Secondary | ICD-10-CM | POA: Diagnosis present

## 2020-05-17 DIAGNOSIS — Z560 Unemployment, unspecified: Secondary | ICD-10-CM

## 2020-05-17 MED ORDER — OMEGA-3-ACID ETHYL ESTERS 1 G PO CAPS
1.0000 g | ORAL_CAPSULE | Freq: Two times a day (BID) | ORAL | Status: DC
  Administered 2020-05-17 – 2020-05-29 (×24): 1 g via ORAL
  Filled 2020-05-17 (×12): qty 1
  Filled 2020-05-17: qty 14
  Filled 2020-05-17 (×6): qty 1
  Filled 2020-05-17: qty 14
  Filled 2020-05-17 (×4): qty 1
  Filled 2020-05-17: qty 14
  Filled 2020-05-17 (×3): qty 1
  Filled 2020-05-17: qty 14
  Filled 2020-05-17: qty 1

## 2020-05-17 MED ORDER — CARIPRAZINE HCL 3 MG PO CAPS: 3.0000 mg | ORAL_CAPSULE | Freq: Every day | ORAL | Status: DC

## 2020-05-17 MED ORDER — ASCORBIC ACID 500 MG PO TABS
2000.0000 mg | ORAL_TABLET | Freq: Every day | ORAL | Status: DC
  Filled 2020-05-17: qty 4

## 2020-05-17 MED ORDER — LAMOTRIGINE 200 MG PO TABS
200.0000 mg | ORAL_TABLET | Freq: Every day | ORAL | Status: DC
  Administered 2020-05-17 – 2020-05-28 (×12): 200 mg via ORAL
  Filled 2020-05-17 (×5): qty 1
  Filled 2020-05-17: qty 2
  Filled 2020-05-17 (×2): qty 14
  Filled 2020-05-17 (×7): qty 1
  Filled 2020-05-17: qty 2
  Filled 2020-05-17: qty 1

## 2020-05-17 MED ORDER — LAMOTRIGINE 100 MG PO TABS: 200.0000 mg | ORAL_TABLET | Freq: Every day | ORAL | Status: DC

## 2020-05-17 MED ORDER — ATOMOXETINE HCL 25 MG PO CAPS: 35.0000 mg | ORAL_CAPSULE | Freq: Every day | ORAL | Status: DC

## 2020-05-17 MED ORDER — LORAZEPAM 2 MG/ML IJ SOLN: 2.0000 mg | Freq: Once | INTRAMUSCULAR | Status: DC

## 2020-05-17 MED ORDER — ASCORBIC ACID 500 MG PO TABS
2000.0000 mg | ORAL_TABLET | Freq: Every day | ORAL | Status: DC
  Administered 2020-05-18: 2000 mg via ORAL
  Filled 2020-05-17 (×6): qty 4

## 2020-05-17 MED ORDER — ADULT MULTIVITAMIN W/MINERALS CH
1.0000 | ORAL_TABLET | Freq: Every day | ORAL | Status: DC
  Filled 2020-05-17 (×14): qty 1

## 2020-05-17 MED ORDER — OMEGA-3-ACID ETHYL ESTERS 1 G PO CAPS: 1.0000 g | ORAL_CAPSULE | Freq: Two times a day (BID) | ORAL | Status: DC

## 2020-05-17 MED ORDER — VITAMIN D3 25 MCG PO TABS
2000.0000 [IU] | ORAL_TABLET | Freq: Every day | ORAL | Status: DC
  Administered 2020-05-18 – 2020-05-29 (×12): 2000 [IU] via ORAL
  Filled 2020-05-17 (×14): qty 2

## 2020-05-17 MED ORDER — CHLORPROMAZINE HCL 25 MG PO TABS
25.0000 mg | ORAL_TABLET | Freq: Two times a day (BID) | ORAL | Status: DC
  Administered 2020-05-17: 25 mg via ORAL
  Filled 2020-05-17: qty 1

## 2020-05-17 MED ORDER — ATOMOXETINE HCL 25 MG PO CAPS
35.0000 mg | ORAL_CAPSULE | Freq: Every day | ORAL | Status: DC
  Administered 2020-05-18: 35 mg via ORAL
  Filled 2020-05-17 (×2): qty 1

## 2020-05-17 MED ORDER — ADULT MULTIVITAMIN W/MINERALS CH: 1.0000 | ORAL_TABLET | Freq: Every day | ORAL | Status: DC

## 2020-05-17 MED ORDER — MULTIVITAMINS PO CAPS: 1.0000 | ORAL_CAPSULE | Freq: Every day | ORAL | Status: DC

## 2020-05-17 MED ORDER — VITAMIN D 25 MCG (1000 UNIT) PO TABS
2000.0000 [IU] | ORAL_TABLET | Freq: Every day | ORAL | Status: DC
  Filled 2020-05-17: qty 2

## 2020-05-17 MED ORDER — ATOMOXETINE HCL 18 MG PO CAPS
36.0000 mg | ORAL_CAPSULE | Freq: Every day | ORAL | Status: DC
  Filled 2020-05-17: qty 2

## 2020-05-17 MED ORDER — FISH OIL 1000 MG PO CAPS: 1000.0000 mg | ORAL_CAPSULE | Freq: Every day | ORAL | Status: DC

## 2020-05-17 MED ORDER — CHLORPROMAZINE HCL 25 MG PO TABS
25.0000 mg | ORAL_TABLET | Freq: Two times a day (BID) | ORAL | Status: DC
  Administered 2020-05-17 – 2020-05-18 (×2): 25 mg via ORAL
  Filled 2020-05-17 (×5): qty 1

## 2020-05-17 NOTE — Progress Notes (Signed)
Pt is a 34 yo male who was admitted involuntarily to [facility] due to suicide attempt.  Pt took 30 pills of his Vraylar.  Pt said that he cannot identify any stressor that contributed to his increased depression and his attempt to end his life.  Pt was at [facility] a month ago for SI and depression and bipolar disorder. Pt said that he needs to go directly to long term residential treatment  Following discharge from [facility].   Pt said that he and his parents have been looking into long term residential programs and he identified [facility] in [place] as a possibility.  Pt presents as flat, says he's been depressed his "entire life".  Pt was calm and cooperative during assessment.  Pt signed admission consent forms.  RN initiated q15 min safety checks.

## 2020-05-17 NOTE — ED Notes (Signed)
Pharm to verify

## 2020-05-17 NOTE — Progress Notes (Signed)
Pt  has been cooperative with treatment, he denies SI/HI/AVH. He was pleasant on approach and voiced no concerns on shift thus far He was mostly isolative to his room on shift. Pt has been taking his medications without incident; no adverse affects noted.  Patient resting in bed quietly at this time.

## 2020-05-17 NOTE — Progress Notes (Signed)
Patient has been accepted to [facility] for inpatient psychiatric treatment.   Per  Percell Boston, RN Director/AC,  patient has been accepted to [facility], bed 300-1 ; Accepting provider is Dr. Lucianne Muss, MD; Attending provider is Dr. Jama Flavors, MD.    Patient can arrive anytime after 2:30pm. Number for report is [phone].    Irven Baltimore, RN notified.      Baldo Daub, MSW, LCSW Clinical Social Worker [facility]

## 2020-05-17 NOTE — ED Notes (Signed)
Pt requesting his daily meds. RN called pharm tech to reconcile

## 2020-05-17 NOTE — Tx Team (Signed)
Initial Treatment Plan 05/17/2020 6:24 PM Adam Larsen. FSF:[id number]    PATIENT 2STRESSORS: Marital or family conflict Medication change or noncompliance   PATIENT STRENGTHS: Ability for insight Physical Health Supportive family/friends   PATIENT IDENTIFIED PROBLEMS: SI - overdosed on Vraylar  Depression  Ineffective Coping                 DISCHARGE CRITERIA:  Improved stabilization in mood, thinking, and/or behavior Verbal commitment to aftercare and medication compliance  PRELIMINARY DISCHARGE PLAN: Long term inpatient treatment  PATIENT/FAMILY INVOLVEMENT: This treatment plan has been presented to and reviewed with the patient, [name].  The patient has been given the opportunity to ask questions and make suggestions.  Garnette Scheuermann, RN 05/17/2020, 6:24 PM

## 2020-05-17 NOTE — ED Provider Notes (Signed)
Emergency Medicine Observation Re-evaluation Note  Adam Larsen. is a 34 y.o. male, seen on rounds today.  Pt initially presented to the ED for complaints of Drug Overdose Currently, the patient is sitting up in the chair in his room watching television, he denies any current complaints.  Nursing staff report no issues overnight.  Physical Exam  BP 127/81 (BP Location: Right Arm)   Pulse 75   Temp 98.4 F (36.9 C) (Oral)   Resp 15   Ht 5' 11.25" (1.81 m)   Wt 90.7 kg   SpO2 99%   BMI 27.70 kg/m  Physical Exam Vitals and nursing note reviewed.  Constitutional:      General: He is not in acute distress.    Appearance: Normal appearance. He is well-developed. He is not ill-appearing or diaphoretic.  HENT:     Head: Normocephalic and atraumatic.  Eyes:     General:        Right eye: No discharge.        Left eye: No discharge.  Pulmonary:     Effort: Pulmonary effort is normal. No respiratory distress.  Neurological:     Mental Status: He is alert.     Coordination: Coordination normal.  Psychiatric:        Behavior: Behavior normal.        Thought Content: Thought content includes suicidal ideation.     ED Course / MDM  EKG:EKG Interpretation  Date/Time:  Tuesday May 16 2020 23:12:05 EDT Ventricular Rate:  72 PR Interval:  154 QRS Duration: 100 QT Interval:  398 QTC Calculation: 435 R Axis:   37 Text Interpretation: Normal sinus rhythm Normal ECG When compared with ECG of 04/20/2020, HEART RATE has decreased Confirmed by Dione Booze ([id number]) on 05/17/2020 3:26:57 AM    I have reviewed the labs performed to date as well as medications administered while in observation.  No recent changes in the last 24 hours.  Pharmacy reviewed home medications have been ordered and restarted for patient.  Lab work completed yesterday without acute abnormality. Plan  Current plan is for inpatient treatment, they are currently seeking placement. Patient's home medications have  already been reviewed and ordered.   Patient is under full IVC at this time.   Dartha Lodge, PA-C 05/17/20 1133    Blane Ohara, MD 05/17/20 1510

## 2020-05-17 NOTE — ED Notes (Signed)
Breakfast ordered 

## 2020-05-18 DIAGNOSIS — F3132 Bipolar disorder, current episode depressed, moderate: Secondary | ICD-10-CM

## 2020-05-18 MED ORDER — NICOTINE 21 MG/24HR TD PT24
21.0000 mg | MEDICATED_PATCH | Freq: Every day | TRANSDERMAL | Status: DC
  Administered 2020-05-18 – 2020-05-29 (×12): 21 mg via TRANSDERMAL
  Filled 2020-05-18 (×13): qty 1

## 2020-05-18 MED ORDER — BUPROPION HCL ER (XL) 150 MG PO TB24
150.0000 mg | ORAL_TABLET | Freq: Every day | ORAL | Status: DC
  Administered 2020-05-19 – 2020-05-22 (×4): 150 mg via ORAL
  Filled 2020-05-18 (×5): qty 1

## 2020-05-18 MED ORDER — CHLORPROMAZINE HCL 25 MG PO TABS
25.0000 mg | ORAL_TABLET | Freq: Two times a day (BID) | ORAL | Status: DC
  Administered 2020-05-18 – 2020-05-19 (×2): 25 mg via ORAL
  Filled 2020-05-18 (×6): qty 1

## 2020-05-18 NOTE — BHH Counselor (Signed)
CSW spoke with both patient and patient's parents in regards to family's desire for patient to have inpatient treatment referral.  CSW provided the following information to the family:  [facility],  [phone] Three programs that patient may benefit from [employer] in [place] $800/day plus a $1,000 one time incidental fee.  [facility] $19,500/month.    CSW called to make referral and left HIPAA compliant voicemail.  [facility] [place] [facility] costs #33,000 month. Length of stay is based on clinical need.  CSW called to make referral and left HIPAA compliant voicemail.  [facility], [phone] Cost is $975/day or $29,250 month.   CSW spoke with KW.  He requested that face sheet and H&P be faxed to [phone].    Family and patient is aware of the cost and willing to pay out of pocket.  Referrals do not accept patient's insurance.  CSW discussed with the patient and family signing consent for all referrals to increase patient's chances of possibly being accepted to a program during hospital stay.  CSW did review PHP programs with patient and family, they declined at this time.    Penni Homans, MSW, LCSW 05/18/2020 3:14 PM

## 2020-05-18 NOTE — BHH Suicide Risk Assessment (Signed)
B[facility] INPATIENT:  Family/Significant Other Suicide Prevention Education  Suicide Prevention Education:  Education Completed; Siddh Vandeventer, mother, [phone] has been identified by the patient as the family member/significant other with whom the patient will be residing, and identified as the person(s) who will aid the patient in the event of a mental health crisis (suicidal ideations/suicide attempt).  With written consent from the patient, the family member/significant other has been provided the following suicide prevention education, prior to the and/or following the discharge of the patient.  The suicide prevention education provided includes the following:  Suicide risk factors  Suicide prevention and interventions  National Suicide Hotline telephone number  [facility] assessment telephone number  [facility] Emergency Assistance 911  [facility] and/or Residential Mobile Crisis Unit telephone number  Request made of family/significant other to:  Remove weapons (e.g., guns, rifles, knives), all items previously/currently identified as safety concern.    Remove drugs/medications (over-the-counter, prescriptions, illicit drugs), all items previously/currently identified as a safety concern.  The family member/significant other verbalizes understanding of the suicide prevention education information provided.  The family member/significant other agrees to remove the items of safety concern listed above.  Mother reports that she believes patient to be a danger to self due to suicidal ideations.  She denies that pt has access to weapons.  She reports the family is also looking into residential treatment for the pt.    Harden Mo 05/18/2020, 11:36 AM

## 2020-05-18 NOTE — Plan of Care (Signed)
Patient stayed in the milieu until bedtime. Cooperative and getting along with peers. Denied hallucinations. Denied suicidal thoughts. Presented to the medication window to receive bedtime medications.  Was encouraged to express his feelings and thoughts as needed. Currently in bed and safety precautions maintained.

## 2020-05-18 NOTE — Progress Notes (Signed)
Pt endorses SI.  Pt stated that he is depressed and feels hopeless.  Pt contracts for safety on the unit.  Pt is calm and cooperative and takes medications without incident.  RN continues to establish rapport with pt and assess for needs and concerns.  Pt speaks in soft tone of voice and appears guarded with RN.  RN will continue to monitor and provide support as needed. Q 15 min safety checks remain in place.
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
## 2020-05-18 NOTE — BHH Suicide Risk Assessment (Signed)
[facility] Admission Suicide Risk Assessment   Nursing information obtained from:  Patient Demographic factors:  Male, Caucasian Current Mental Status:  Suicidal ideation indicated by patient Loss Factors:  NA Historical Factors:  Prior suicide attempts, Impulsivity Risk Reduction Factors:  Positive social support  Total Time spent with patient: 45 minutes Principal Problem: Bipolar Disorder- depressed , S/P Overdose  Diagnosis:  Active Problems:   MDD (major depressive disorder)  Subjective Data:   Continued Clinical Symptoms:  Alcohol Use Disorder Identification Test Final Score (AUDIT): 1 The "Alcohol Use Disorders Identification Test", Guidelines for Use in Primary Care, Second Edition.  World Science writer [facility]). Score between 0-7:  no or low risk or alcohol related problems. Score between 8-15:  moderate risk of alcohol related problems. Score between 16-19:  high risk of alcohol related problems. Score 20 or above:  warrants further diagnostic evaluation for alcohol dependence and treatment.   CLINICAL FACTORS:  34 year old male, lives with parents, known to staff from recent admission to [facility] earlier this month.  Presented for suicide attempt by overdose (on Vraylar) on 7/27.  He reports overdose as impulsive/unplanned but does acknowledge depression and intermittent suicidal ideations leading up to event.  He reports neurovegetative symptoms of depression.  Currently denies psychotic symptoms, but presents vaguely guarded/suspicious on approach.  He endorses living with parents/unemployment/limited daily structured activities/purpose as major stressor. *Of note, states Vraylar had recently been prescribed by his outpatient psychiatrist but that he had not yet started taking this medication prior to the overdose.    Psychiatric Specialty Exam: Physical Exam  Review of Systems  Blood pressure (!) 126/89, pulse 91, temperature (!) 97.5 F (36.4 C), temperature source Oral,  resp. rate 16, height 5\' 11"  (1.803 m), weight 88 kg, SpO2 98 %.Body mass index is 27.06 kg/m.  See admit note MSE  COGNITIVE FEATURES THAT CONTRIBUTE TO RISK:  Closed-mindedness and Loss of executive function    SUICIDE RISK:   Moderate:  Frequent suicidal ideation with limited intensity, and duration, some specificity in terms of plans, no associated intent, good self-control, limited dysphoria/symptomatology, some risk factors present, and identifiable protective factors, including available and accessible social support.  PLAN OF CARE: Patient will be admitted to inpatient psychiatric unit for stabilization and safety. Will provide and encourage milieu participation. Provide medication management and maked adjustments as needed.  Will follow daily.    I certify that inpatient services furnished can reasonably be expected to improve the patient's condition.   , MD 05/18/2020, 1:24 PM

## 2020-05-18 NOTE — H&P (Addendum)
Psychiatric Admission Assessment Adult  Patient Identification: Adam Larsen. MRN:  [id number] Date of Evaluation:  05/18/2020 Chief Complaint:  Overdose  Principal Diagnosis: Bipolar disorder, depressed.  Status post suicide attempt by overdose Diagnosis:  Bipolar disorder, depressed.  Status post suicide attempt by overdose History of Present Illness: 34 year old male, lives with parents, known to [place] from recent psychiatric admission to [facility].  Presented to ED on 7/27 following overdose on Vraylar.  He states that overdose was with suicidal intent.  He describes overdose as impulsive, unplanned, but does endorse having had intermittent suicidal thoughts leading up to event. He states he has been depressed, which he attributes to "feeling stuck".  Explains that he lives with his parents, he is unemployed, and has little daily structured activities. With regards to [facility], he states that this medication has been started just recently by his outpatient psychiatrist, states he had picked up the prescription but had not actually started taking this medication yet.    As noted, patient has a history of a prior psychiatric admission here at [facility] from 6/30 through 04/30/2020.  At the time he was admitted for overdosing on chemicals (Oxley clean and Beasley) and on Nexium.  He was discharged on Thorazine, Strattera, Lamictal, which she states he has continued to take, except for Thorazine which was in the process of being switched to another antipsychotic (Vraylar as above).  He endorses neurovegetative symptoms as below.  Currently denies hallucinations and does not express delusional ideations at this time.  Of note, on last admission patient had presented guarded, suspicious, and had generally refused to speak with providers other than with Dr. Gerarda Fraction ( psychiatry resident ) .  At this time he appears vaguely guarded but was compliant with interview session.   Associated Signs/Symptoms: Depression  Symptoms:  depressed mood, anhedonia, insomnia, suicidal thoughts with specific plan, suicidal attempt, loss of energy/fatigue, decreased appetite, (Hypo) Manic Symptoms: Irritability Anxiety Symptoms: Some subjective anxiety Psychotic Symptoms:  Currently denies hallucinations and no delusions are expressed PTSD Symptoms: Does not endorse Total Time spent with patient: 45 minutes  Past Psychiatric History: History of prior psychiatric admission, as noted was hospitalized here at [facility] earlier this month.  He reports he has been diagnosed with bipolar disorder, and also reports a history of ADHD.  Currently denies history of psychosis although does acknowledge suspiciousness and vague paranoia.  He reports episodes of depression and he also states he has been "manic" in the past. He reports he has been taking his psychiatric medications, and has noted states that he was in the process of starting Vraylar, prescribed by his outpatient psychiatrist. He states he had not yet started taking this medication.   Is the patient at risk to self? Yes.    Has the patient been a risk to self in the past 6 months? Yes.    Has the patient been a risk to self within the distant past? No.  Is the patient a risk to others? No.  Has the patient been a risk to others in the past 6 months? No.  Has the patient been a risk to others within the distant past? No.   Prior Inpatient Therapy:  As above Prior Outpatient Therapy:  States he follows up with Dr. [name] for outpatient psychiatric management  Alcohol Screening: 1. How often do you have a drink containing alcohol?: Monthly or less 2. How many drinks containing alcohol do you have on a typical day when you are drinking?: 1 or 2  3. How often do you have six or more drinks on one occasion?: Never AUDIT-C Score: 1 4. How often during the last year have you found that you were not able to stop drinking once you had started?: Never 5. How often during the  last year have you failed to do what was normally expected from you because of drinking?: Never 6. How often during the last year have you needed a first drink in the morning to get yourself going after a heavy drinking session?: Never 7. How often during the last year have you had a feeling of guilt of remorse after drinking?: Never 8. How often during the last year have you been unable to remember what happened the night before because you had been drinking?: Never 9. Have you or someone else been injured as a result of your drinking?: No 10. Has a relative or friend or a doctor or another health worker been concerned about your drinking or suggested you cut down?: No Alcohol Use Disorder Identification Test Final Score (AUDIT): 1 Substance Abuse History in the last 12 months: Denies alcohol or drug abuse Consequences of Substance Abuse: Denies Previous Psychotropic Medications: Most recent medication regimen reported as Strattera 36 mg daily, Lamictal 200 mg nightly, Thorazine 25 mg twice daily (which he had stopped taking) and recent addition of Vraylar (3 mg), which he states he had not yet started taking. In the past he remembers having been on Abilify, Latuda, which he feels did not work well for him.  He also remembers Rexulti, which he states his parents did not think was "helping a little" Psychological Evaluations:  No  Past Medical History: Denies medical illnesses.  NKDA Past Medical History:  Diagnosis Date  . Anxiety   . Bipolar 1 disorder (HCC)   . Depression   . Mania (HCC)   . Suicidal ideations     Past Surgical History:  Procedure Laterality Date  . WISDOM TOOTH EXTRACTION     Family History: Lives with parents Family History  Problem Relation Age of Onset  . Schizophrenia Paternal Grandmother    Family Psychiatric  History: As above Tobacco Screening:  Vapes Social History: 33, single, no children, lives with parents, currently unemployed, [employer], [employer]  Social History   Substance and Sexual Activity  Alcohol Use Never     Social History   Substance and Sexual Activity  Drug Use Never    Additional Social History: Marital status: Single Are you sexually active?: No What is your sexual orientation?: "Straight" Has your sexual activity been affected by drugs, alcohol, medication, or emotional stress?: Pt denies. Does patient have children?: No  Allergies:  No Known Allergies Lab Results:  Results for orders placed or performed during the hospital encounter of 05/16/20 (from the past 48 hour(s))  SARS Coronavirus 2 by RT PCR     Status: None   Collection Time: 05/16/20 12:59 PM  Result Value Ref Range   SARS Coronavirus 2 NEGATIVE NEGATIVE    Comment: (NOTE) Result indicates the ABSENCE of SARS-CoV-2 RNA in the patient specimen.   The lowest concentration of SARS-CoV-2 viral copies this assay can detect in nasopharyngeal swab specimens is 500 copies / mL.  A negative result does not preclude SARS-CoV-2 infection and should not be used as the sole basis for patient management decisions. A negative result may occur with improper specimen collection / handling, submission of a specimen other than nasopharyngeal swab, presence of viral mutation(s) within the areas targeted  by this assay, and inadequate number of viral copies (<500 copies / mL) present.  Negative results must be combined with clinical observations, patient history, and epidemiological information.   The expected result is NEGATIVE.   Patient Fact Sheet:  https://wong-henderson.biz/    Provider Fact Sheet:  CheapJackpot.at    This test is not yet approved or cleared by the [place] FDA and  has been [place] orized for detection and/or diagnosis of SARS-CoV-2 by FDA under an Emergency Use Authorization (EUA).  This EUA will remain in effect (meaning this test can be used) for the duration of   the COVID-19 declaration under Section 564(b)(1) of the Act, 21 U.S.C. section 360bbb-3(b)(1), unless the authorization is terminated or revoked sooner  Performed at [facility], [address] [place], [place] [address]     Blood Alcohol level:  Lab Results  Component Value Date   [facility] <10 05/16/2020   ETH <10 04/19/2020    Metabolic Disorder Labs:  Lab Results  Component Value Date   HGBA1C 5.1 04/21/2020   MPG 99.67 04/21/2020   No results found for: PROLACTIN Lab Results  Component Value Date   CHOL 150 04/21/2020   TRIG 71 04/21/2020   HDL 47 04/21/2020   CHOLHDL 3.2 04/21/2020   VLDL 14 04/21/2020   LDLCALC 89 04/21/2020    Current Medications: Current Facility-Administered Medications  Medication Dose Route Frequency Provider Last Rate Last Admin  . ascorbic acid (VITAMIN C) tablet 2,000 mg  2,000 mg Oral Daily Denzil Magnuson, NP   2,000 mg at 05/18/20 0807  . atomoxetine (STRATTERA) capsule 35 mg  35 mg Oral Daily Denzil Magnuson, NP   35 mg at 05/18/20 6045  . chlorproMAZINE (THORAZINE) tablet 25 mg  25 mg Oral BID Denzil Magnuson, NP   25 mg at 05/18/20 0809  . lamoTRIgine (LAMICTAL) tablet 200 mg  200 mg Oral QHS Denzil Magnuson, NP   200 mg at 05/17/20 2111  . multivitamin with minerals tablet 1 tablet  1 tablet Oral Daily Denzil Magnuson, NP      . nicotine (NICODERM CQ - dosed in mg/24 hours) patch 21 mg  21 mg Transdermal Daily Alyzae Hawkey, Rockey Situ, MD   21 mg at 05/18/20 1130  . omega-3 acid ethyl esters (LOVAZA) capsule 1 g  1 g Oral BID Denzil Magnuson, NP   1 g at 05/18/20 4098  . Vitamin D3 (Vitamin D) tablet 2,000 Units  2,000 Units Oral Daily Denzil Magnuson, NP   2,000 Units at 05/18/20 0808   PTA Medications: Medications Prior to Admission  Medication Sig Dispense Refill Last Dose  . Ascorbic Acid (VITAMIN C) 1000 MG tablet Take 2,000 mg by mouth daily.     Marland Kitchen atomoxetine (STRATTERA) 18 MG capsule Take 2 capsules (36 mg total) by  mouth daily. 60 capsule 2   . cariprazine (VRAYLAR) capsule Take 1 capsule (3 mg total) by mouth daily. 30 capsule 1   . chlorproMAZINE (THORAZINE) 25 MG tablet Take 1 tablet (25 mg total) by mouth 2 (two) times daily. 60 tablet 2   . cholecalciferol (VITAMIN D3) 25 MCG (1000 UNIT) tablet Take 2,000 Units by mouth daily.     Marland Kitchen lamoTRIgine (LAMICTAL) 200 MG tablet Take 1 tablet (200 mg total) by mouth at bedtime. 30 tablet 2   . Multiple Vitamin (MULTIVITAMIN) capsule Take 1 capsule by mouth daily.     . Omega-3 Fatty Acids (FISH OIL) 1000 MG CAPS Take 1,000 mg by  mouth daily.       Musculoskeletal: Strength & Muscle Tone: within normal limits Gait & Station: normal Patient leans: N/A  Psychiatric Specialty Exam: Physical Exam  Review of Systems  Constitutional: Negative.   HENT: Negative.   Eyes: Negative.   Respiratory: Negative.   Cardiovascular: Negative.   Gastrointestinal: Negative.   Endocrine: Negative.   Genitourinary: Negative.   Musculoskeletal: Negative.   Skin: Negative.  Negative for rash.  Neurological: Negative for seizures and headaches.       Reports he feels "a little foggy"  Psychiatric/Behavioral: Positive for suicidal ideas.       Depression  All other systems reviewed and are negative.   Blood pressure (!) 126/89, pulse 91, temperature (!) 97.5 F (36.4 C), temperature source Oral, resp. rate 16, height 5\' 11"  (1.803 m), weight 88 kg, SpO2 98 %.Body mass index is 27.06 kg/m.  General Appearance: Casual  Eye Contact:  Fair  Speech:  Normal Rate  Volume:  Normal-monotone  Mood:  Depressed  Affect:  Vaguely guarded/irritable, restricted  Thought Process:  Linear and Descriptions of Associations: Intact  Orientation:  Full (Time, Place, and Person)  Thought Content:  Denies hallucinations, does not appear internally preoccupied, no delusions are expressed at this time, presents vaguely guarded/suspicious but no overt psychosis noted  Suicidal Thoughts:   No currently denies suicidal plan or intention and contracts for safety on unit  Homicidal Thoughts:  No  Memory:  Recent and remote grossly intact  Judgement:  Fair  Insight:  Limited  Psychomotor Activity:  Normal-no psychomotor agitation or restlessness  Concentration:  Concentration: Good and Attention Span: Good  Recall:  Good  Fund of Knowledge:  Good  Language:  Good  Akathisia:  Negative  Handed:  Right  AIMS (if indicated):     Assets:  Communication Skills Desire for Improvement Resilience  ADL's:  Intact  Cognition:  WNL  Sleep:  Number of Hours: 1.25    Treatment Plan Summary: Daily contact with patient to assess and evaluate symptoms and progress in treatment, Medication management, Plan Inpatient treatment and Medications as below  Observation Level/Precautions:  15 minute checks  Laboratory:  As needed  Psychotherapy: Milieu/group therapy  Medications: Patient presents actively engaged in medication management decision making.  He states he feels Wellbutrin trial (replacing Strattera) may be helpful to him, to help address depression as well as ADHD symptoms.  Of note, denies any history of seizures or head trauma.  He states he is interested in for mood disorder.  As above, he explains he had just recently been prescribed this medication and had not yet started taking it prior to his overdose.  States "I want to give it a chance, I think it may work".  Based on Vraylar half-life and recent overdose on it we will not yet start this medication.  He is in agreement with continuing lamotrigine, which she states is not causing any side effects.  Consultations: As needed  Discharge Concerns: -   Estimated LOS: 5 days  Other:     Physician Treatment Plan for Primary Diagnosis: Bipolar disorder, depressed Long Term Goal(s): Improvement in symptoms so as ready for discharge  Short Term Goals: Ability to identify changes in lifestyle to reduce  recurrence of condition will improve, Ability to verbalize feelings will improve, Ability to disclose and discuss suicidal ideas, Ability to demonstrate self-control will improve, Ability to identify and develop effective coping behaviors will improve, Ability to maintain clinical measurements within  normal limits will improve, Compliance with prescribed medications will improve and Ability to identify triggers associated with substance abuse/mental health issues will improve  Physician Treatment Plan for Secondary Diagnosis: Status post overdose/suicide attempt Long Term Goal(s): Improvement in symptoms so as ready for discharge  Short Term Goals: Ability to identify changes in lifestyle to reduce recurrence of condition will improve, Ability to verbalize feelings will improve, Ability to disclose and discuss suicidal ideas, Ability to demonstrate self-control will improve, Ability to identify and develop effective coping behaviors will improve, Ability to maintain clinical measurements within normal limits will improve and Compliance with prescribed medications will improve  I certify that inpatient services furnished can reasonably be expected to improve the patient's condition.    Craige CottaFernando A Anjalee Cope, MD 7/29/202112:56 PM

## 2020-05-19 MED ORDER — CARIPRAZINE HCL 1.5 MG PO CAPS
1.5000 mg | ORAL_CAPSULE | Freq: Every day | ORAL | Status: DC
  Administered 2020-05-20 – 2020-05-21 (×2): 1.5 mg via ORAL
  Filled 2020-05-19 (×3): qty 1

## 2020-05-19 NOTE — Progress Notes (Signed)
   05/19/20 2115  Psych Admission Type (Psych Patients Only)  Admission Status Involuntary  Psychosocial Assessment  Patient Complaints Anxiety;Depression  Eye Contact Fair  Facial Expression Anxious  Affect Anxious;Depressed  Speech Logical/coherent  Interaction Minimal;Forwards little  Motor Activity Other (Comment) (WDL)  Appearance/Hygiene Unremarkable  Behavior Characteristics Cooperative;Anxious  Mood Depressed;Anxious;Pleasant  Thought Process  Coherency WDL  Content WDL  Delusions None reported or observed  Perception WDL  Hallucination None reported or observed  Judgment Poor  Confusion None  Danger to Self  Current suicidal ideation? Passive  Self-Injurious Behavior No self-injurious ideation or behavior indicators observed or expressed   Agreement Not to Harm Self Yes  Description of Agreement verbally contracts for safety  Danger to Others  Danger to Others None reported or observed

## 2020-05-19 NOTE — Progress Notes (Signed)
Progress note  Pt expressed si on their self inventory. Pt was approached and pt states it is passive with no plan. Pt contracts for safety at this time and agrees to approach staff if they come up with a plan.

## 2020-05-19 NOTE — BHH Counselor (Signed)
CSW again attempted to contact [facility] to identify if they accept the patient's insurance and what would be out of pocket pay if they do not.    At this time, call has not been returned.  CSW left HIPAA compliant voicemail.  Penni Homans, MSW, LCSW 05/19/2020 11:50 AM

## 2020-05-19 NOTE — BHH Counselor (Signed)
CSW supported patient in beginning his phone interview with [facility].  Maisie Fus has been the point of contact, [phone]. He reports that they should have an answer by end of day.   CSW staff will follow up.  Penni Homans, MSW, LCSW 05/19/2020 3:55 PM

## 2020-05-19 NOTE — BHH Counselor (Signed)
CSW did receive a return call from [facility] [phone], who reports that they are NOT in network with [facility].    He reports that the family would have to pay out of pocket, and the cost before any "needs based financial assistance" and "for 30 days the cost is about $30,000".   Penni Homans, MSW, LCSW 05/19/2020 3:59 PM

## 2020-05-19 NOTE — Progress Notes (Signed)
Recreation Therapy Notes  Date: 7.30.21 Time: 0930 Location: [address] Dayroom  Group Topic: Stress Management  Goal Area(s) Addresses:  Patient will identify positive stress management techniques. Patient will identify benefits of using stress management post d/c.  Behavioral Response: Engaged  Intervention: Stress Management  Activity:  Meditation.  LRT played a meditation that focused on making choices.  Patients were to listen and follow along as meditation was played to engage in activity.    Education:  Stress Management, Discharge Planning.   Education Outcome: Acknowledges Education  Clinical Observations/Feedback: Pt attended and participated in activity.    Caroll Rancher, LRT/CTRS     Lillia Abed, Arabelle Bollig A 05/19/2020 11:21 AM

## 2020-05-19 NOTE — Progress Notes (Signed)
   05/19/20 2115  COVID-19 Daily Checkoff  Have you had a fever (temp > 37.80C/100F)  in the past 24 hours?  No  COVID-19 EXPOSURE  Have you traveled outside the state in the past 14 days? No  Have you been in contact with someone with a confirmed diagnosis of COVID-19 or PUI in the past 14 days without wearing appropriate PPE? No  Have you been living in the same home as a person with confirmed diagnosis of COVID-19 or a PUI (household contact)? No  Have you been diagnosed with COVID-19? No

## 2020-05-19 NOTE — Tx Team (Cosign Needed)
Interdisciplinary Treatment and Diagnostic Plan Update  05/19/2020 Time of Session: [place]. MRN: [id number]  Principal Diagnosis: <principal problem not specified>  Secondary Diagnoses: Active Problems:   MDD (major depressive disorder)   Current Medications:  Current Facility-Administered Medications  Medication Dose Route Frequency Provider Last Rate Last Admin  . ascorbic acid (VITAMIN C) tablet 2,000 mg  2,000 mg Oral Daily Mordecai Maes, NP   2,000 mg at 05/18/20 0807  . buPROPion (WELLBUTRIN XL) 24 hr tablet 150 mg  150 mg Oral Daily Cobos, Myer Peer, MD   150 mg at 05/19/20 0759  . chlorproMAZINE (THORAZINE) tablet 25 mg  25 mg Oral BID Cobos, Myer Peer, MD   25 mg at 05/19/20 0800  . lamoTRIgine (LAMICTAL) tablet 200 mg  200 mg Oral QHS Mordecai Maes, NP   200 mg at 05/18/20 2128  . multivitamin with minerals tablet 1 tablet  1 tablet Oral Daily Mordecai Maes, NP      . nicotine (NICODERM CQ - dosed in mg/24 hours) patch 21 mg  21 mg Transdermal Daily Cobos, Myer Peer, MD   21 mg at 05/19/20 0800  . omega-3 acid ethyl esters (LOVAZA) capsule 1 g  1 g Oral BID Mordecai Maes, NP   1 g at 05/19/20 0800  . Vitamin D3 (Vitamin D) tablet 2,000 Units  2,000 Units Oral Daily Mordecai Maes, NP   2,000 Units at 05/19/20 0759   PTA Medications: Medications Prior to Admission  Medication Sig Dispense Refill Last Dose  . Ascorbic Acid (VITAMIN C) 1000 MG tablet Take 2,000 mg by mouth daily.     Marland Kitchen atomoxetine (STRATTERA) 18 MG capsule Take 2 capsules (36 mg total) by mouth daily. 60 capsule 2   . cariprazine (VRAYLAR) capsule Take 1 capsule (3 mg total) by mouth daily. 30 capsule 1   . chlorproMAZINE (THORAZINE) 25 MG tablet Take 1 tablet (25 mg total) by mouth 2 (two) times daily. 60 tablet 2   . cholecalciferol (VITAMIN D3) 25 MCG (1000 UNIT) tablet Take 2,000 Units by mouth daily.     Marland Kitchen lamoTRIgine (LAMICTAL) 200 MG tablet Take 1 tablet (200 mg total) by  mouth at bedtime. 30 tablet 2   . Multiple Vitamin (MULTIVITAMIN) capsule Take 1 capsule by mouth daily.     . Omega-3 Fatty Acids (FISH OIL) 1000 MG CAPS Take 1,000 mg by mouth daily.       Patient Stressors: Marital or family conflict Medication change or noncompliance  Patient Strengths: Ability for insight Physical Health Supportive family/friends  Treatment Modalities: Medication Management, Group therapy, Case management,  1 to 1 session with clinician, Psychoeducation, Recreational therapy.   Physician Treatment Plan for Primary Diagnosis: <principal problem not specified> Long Term Goal(s): Improvement in symptoms so as ready for discharge Improvement in symptoms so as ready for discharge   Short Term Goals: Ability to identify changes in lifestyle to reduce recurrence of condition will improve Ability to verbalize feelings will improve Ability to disclose and discuss suicidal ideas Ability to demonstrate self-control will improve Ability to identify and develop effective coping behaviors will improve Ability to maintain clinical measurements within normal limits will improve Compliance with prescribed medications will improve Ability to identify triggers associated with substance abuse/mental health issues will improve Ability to identify changes in lifestyle to reduce recurrence of condition will improve Ability to verbalize feelings will improve Ability to disclose and discuss suicidal ideas Ability to demonstrate self-control will improve Ability to identify and develop  effective coping behaviors will improve Ability to maintain clinical measurements within normal limits will improve Compliance with prescribed medications will improve  Medication Management: Evaluate patient's response, side effects, and tolerance of medication regimen.  Therapeutic Interventions: 1 to 1 sessions, Unit Group sessions and Medication administration.  Evaluation of Outcomes: Not  Met  Physician Treatment Plan for Secondary Diagnosis: Active Problems:   MDD (major depressive disorder)  Long Term Goal(s): Improvement in symptoms so as ready for discharge Improvement in symptoms so as ready for discharge   Short Term Goals: Ability to identify changes in lifestyle to reduce recurrence of condition will improve Ability to verbalize feelings will improve Ability to disclose and discuss suicidal ideas Ability to demonstrate self-control will improve Ability to identify and develop effective coping behaviors will improve Ability to maintain clinical measurements within normal limits will improve Compliance with prescribed medications will improve Ability to identify triggers associated with substance abuse/mental health issues will improve Ability to identify changes in lifestyle to reduce recurrence of condition will improve Ability to verbalize feelings will improve Ability to disclose and discuss suicidal ideas Ability to demonstrate self-control will improve Ability to identify and develop effective coping behaviors will improve Ability to maintain clinical measurements within normal limits will improve Compliance with prescribed medications will improve     Medication Management: Evaluate patient's response, side effects, and tolerance of medication regimen.  Therapeutic Interventions: 1 to 1 sessions, Unit Group sessions and Medication administration.  Evaluation of Outcomes: Not Met   RN Treatment Plan for Primary Diagnosis: <principal problem not specified> Long Term Goal(s): Knowledge of disease and therapeutic regimen to maintain health will improve  Short Term Goals: Ability to remain free from injury will improve, Ability to verbalize frustration and anger appropriately will improve, Ability to demonstrate self-control, Ability to participate in decision making will improve, Ability to verbalize feelings will improve, Ability to disclose and discuss  suicidal ideas, Ability to identify and develop effective coping behaviors will improve and Compliance with prescribed medications will improve  Medication Management: RN will administer medications as ordered by provider, will assess and evaluate patient's response and provide education to patient for prescribed medication. RN will report any adverse and/or side effects to prescribing provider.  Therapeutic Interventions: 1 on 1 counseling sessions, Psychoeducation, Medication administration, Evaluate responses to treatment, Monitor vital signs and CBGs as ordered, Perform/monitor CIWA, COWS, AIMS and Fall Risk screenings as ordered, Perform wound care treatments as ordered.  Evaluation of Outcomes: Not Met   LCSW Treatment Plan for Primary Diagnosis: <principal problem not specified> Long Term Goal(s): Safe transition to appropriate next level of care at discharge, Engage patient in therapeutic group addressing interpersonal concerns.  Short Term Goals: Engage patient in aftercare planning with referrals and resources, Increase social support, Increase ability to appropriately verbalize feelings, Increase emotional regulation, Facilitate acceptance of mental health diagnosis and concerns, Facilitate patient progression through stages of change regarding substance use diagnoses and concerns, Identify triggers associated with mental health/substance abuse issues and Increase skills for wellness and recovery  Therapeutic Interventions: Assess for all discharge needs, 1 to 1 time with Social worker, Explore available resources and support systems, Assess for adequacy in community support network, Educate family and significant other(s) on suicide prevention, Complete Psychosocial Assessment, Interpersonal group therapy.  Evaluation of Outcomes: Not Met   Progress in Treatment: Attending groups: Yes. Participating in groups: Yes. Taking medication as prescribed: Yes. Toleration medication:  Yes. Family/Significant other contact made: Yes, individual(s) contacted:  Mother and  father Patient understands diagnosis: Yes. Discussing patient identified problems/goals with staff: Yes. Medical problems stabilized or resolved: Yes. Denies suicidal/homicidal ideation: Yes. Issues/concerns per patient self-inventory: No. Other:   New problem(s) identified: No, Describe:  no  New Short Term/Long Term Goal(s):  Patient Goals:  "Get on the right track" and get into residential [facility] treatment  Discharge Plan or Barriers:   Reason for Continuation of Hospitalization: Aggression Anxiety Delusions  Depression Hallucinations Homicidal ideation [place] Medical Issues Medication stabilization Suicidal ideation Withdrawal symptoms  Estimated Length of Stay:  Attendees: Patient: Adam Larsen.  05/19/2020 1:21 PM  Physician: Neita Garnet 05/19/2020 1:21 PM  Nursing:  05/19/2020 1:21 PM  RN Care Manager: 05/19/2020 1:21 PM  Social Worker:  Macon Large 05/19/2020 1:21 PM  Recreational Therapist:  05/19/2020 1:21 PM  Other:  05/19/2020 1:21 PM  Other:  05/19/2020 1:21 PM  Other: 05/19/2020 1:21 PM    Scribe for Treatment Team: Bethann Berkshire, LCSW 05/19/2020 1:21 PM

## 2020-05-19 NOTE — [facility] Counselor (Signed)
CSW spoke with Britta Mccreedy at [facility], [phone].  She reports that it takes 24 hours to review records.  She reports that she will call at the latest Monday 05/22/2020 to report if patient has been accepted or declined.   Penni Homans, MSW, LCSW 05/19/2020 11:44 AM

## 2020-05-19 NOTE — Progress Notes (Signed)
[facility] MD Progress Note  05/19/2020 3:16 PM Adam Larsen.  MRN:  [id number] Subjective: Patient reports feeling depressed and bored.  Currently denies suicidal ideations and presents future oriented, states his goal is to be admitted to a longer term residential setting following discharge from [facility].  He denies medication side effects thus far.  He is tolerating Wellbutrin XL trial well thus far. Objective: I discussed case with treatment team and met with patient. 34 year old male, lives with parents, known to staff from recent admission to [facility] earlier this month.  Presented for suicide attempt by overdose (on Vraylar) on 7/27.  He reports overdose as impulsive/unplanned but does acknowledge depression and intermittent suicidal ideations leading up to event.  He reports neurovegetative symptoms of depression.  Currently denies psychotic symptoms, but presents vaguely guarded/suspicious on approach.  He endorses living with parents/unemployment/limited daily structured activities/purpose as major stressor. *Of note, states Vraylar had recently been prescribed by his outpatient psychiatrist but that he had not yet started taking this medication prior to the overdose.  Today patient presents alert, attentive, calm/without psychomotor agitation or restlessness.  Remains vaguely guarded but presents cooperative and communicative during session. He continues to endorse depression and the sense of anhedonia.  Currently future oriented and expressing interest in going to a longer term residential setting . He is currently applying to [facility] and I have filled out required medical statement which is part of application process and given it to CSW to add to application/referral packet . Thus far tolerating medications well.  We have discussed options.  As noted, he states that he had recently been prescribed Vraylar by his outpatient psychiatrist but had yet started taking it.  He  overdosed on this medication on 7/26.  He states he is interested in [place] trial, and expresses hope that this medication will be helpful.  He was recently started on Wellbutrin XL, which replaced Strattera.  He denies any medication side effects thus far.  He is also on lamotrigine, which she is tolerating well.  Denies any rash. Limited milieu participation but visible in dayroom, out of room, no agitated or disruptive behaviors on unit. Vitals currently stable-BP 108/83, pulse 76. Principal Problem: Bipolar disorder/depressed. Diagnosis: Active Problems:   MDD (major depressive disorder)  Total Time spent with patient: 20 minutes  Past Psychiatric History:   Past Medical History:  Past Medical History:  Diagnosis Date  . Anxiety   . Bipolar 1 disorder ([place])   . Depression   . Mania ([place])   . Suicidal ideations     Past Surgical History:  Procedure Laterality Date  . WISDOM TOOTH EXTRACTION     Family History:  Family History  Problem Relation Age of Onset  . Schizophrenia Paternal Grandmother    Family Psychiatric  History:  Social History:  Social History   Substance and Sexual Activity  Alcohol Use Never     Social History   Substance and Sexual Activity  Drug Use Never    Social History   Socioeconomic History  . Marital status: Single    Spouse name: Not on file  . Number of children: 0  . Years of education: 10 or 40  . Highest education level: 11th grade  Occupational History  . Not on file  Tobacco Use  . Smoking status: Current Every Day Smoker    Years: 10.00    Types: E-cigarettes  . Smokeless tobacco: Never Used  Vaping Use  . Vaping Use: Every day  .  Substances: Nicotine, Flavoring, Nicotine-salt  Substance and Sexual Activity  . Alcohol use: Never  . Drug use: Never  . Sexual activity: Not Currently  Other Topics Concern  . Not on file  Social History Narrative   Pt lives with his parents. States he has been diagnosed with ADHD,  dyslexia, dysgraphia and learning disabilities. Finished 10 th or 11 th grade but is working on his GED through an online high school program. Pt is currently unemployed.   Social Determinants of Health   Financial Resource Strain:   . Difficulty of Paying Living Expenses:   Food Insecurity:   . Worried About Charity fundraiser in the Last Year:   . Arboriculturist in the Last Year:   Transportation Needs:   . Film/video editor (Medical):   Marland Kitchen Lack of Transportation (Non-Medical):   Physical Activity:   . Days of Exercise per Week:   . Minutes of Exercise per Session:   Stress:   . Feeling of Stress :   Social Connections:   . Frequency of Communication with Friends and Family:   . Frequency of Social Gatherings with Friends and Family:   . Attends Religious Services:   . Active Member of Clubs or Organizations:   . Attends Archivist Meetings:   Marland Kitchen Marital Status:    Additional Social History:   Sleep: Fair  Appetite:  Fair  Current Medications: Current Facility-Administered Medications  Medication Dose Route Frequency Provider Last Rate Last Admin  . ascorbic acid (VITAMIN C) tablet 2,000 mg  2,000 mg Oral Daily Mordecai Maes, NP   2,000 mg at 05/18/20 0807  . buPROPion (WELLBUTRIN XL) 24 hr tablet 150 mg  150 mg Oral Daily Altan Kraai, Myer Peer, MD   150 mg at 05/19/20 0759  . [START ON 05/20/2020] cariprazine (VRAYLAR) capsule 1.5 mg  1.5 mg Oral Daily Nakiea Metzner A, MD      . lamoTRIgine (LAMICTAL) tablet 200 mg  200 mg Oral QHS Mordecai Maes, NP   200 mg at 05/18/20 2128  . multivitamin with minerals tablet 1 tablet  1 tablet Oral Daily Mordecai Maes, NP      . nicotine (NICODERM CQ - dosed in mg/24 hours) patch 21 mg  21 mg Transdermal Daily Taniaya Rudder, Myer Peer, MD   21 mg at 05/19/20 0800  . omega-3 acid ethyl esters (LOVAZA) capsule 1 g  1 g Oral BID Mordecai Maes, NP   1 g at 05/19/20 0800  . Vitamin D3 (Vitamin D) tablet 2,000 Units  2,000  Units Oral Daily Mordecai Maes, NP   2,000 Units at 05/19/20 7510    Lab Results: No results found for this or any previous visit (from the past 48 hour(s)).  Blood Alcohol level:  Lab Results  Component Value Date   ETH <10 05/16/2020   ETH <10 25/85/2778    Metabolic Disorder Labs: Lab Results  Component Value Date   HGBA1C 5.1 04/21/2020   MPG 99.67 04/21/2020   No results found for: PROLACTIN Lab Results  Component Value Date   CHOL 150 04/21/2020   TRIG 71 04/21/2020   HDL 47 04/21/2020   CHOLHDL 3.2 04/21/2020   VLDL 14 04/21/2020   LDLCALC 89 04/21/2020    Physical Findings: AIMS: Facial and Oral Movements Muscles of Facial Expression: None, normal Lips and Perioral Area: None, normal Jaw: None, normal Tongue: None, normal,Extremity Movements Upper (arms, wrists, hands, fingers): None, normal Lower (legs, knees, ankles, toes): None,  normal, Trunk Movements Neck, shoulders, hips: None, normal, Overall Severity Severity of abnormal movements (highest score from questions above): None, normal Incapacitation due to abnormal movements: None, normal Patient's awareness of abnormal movements (rate only patient's report): No Awareness, Dental Status Current problems with teeth and/or dentures?: No Does patient usually wear dentures?: No  CIWA:    COWS:     Musculoskeletal: Strength & Muscle Tone: within normal limits Gait & Station: normal Patient leans: N/A  Psychiatric Specialty Exam: Physical Exam  Review of Systems denies headache, no chest pain, no shortness of breath, no nausea, no vomiting, no restlessness or agitation, no fever, no chills  Blood pressure 108/83, pulse 76, temperature 97.8 F (36.6 C), temperature source Oral, resp. rate 16, height 5' 11"  (1.803 m), weight 88 kg, SpO2 98 %.Body mass index is 27.06 kg/m.  General Appearance: Casual  Eye Contact:  Good  Speech:  Normal Rate  Volume:  Normal  Mood:  Depressed  Affect:  Remains  restricted and vaguely irritable, partially improved compared to admission  Thought Process:  Linear and Descriptions of Associations: Intact  Orientation:  Full (Time, Place, and Person)  Thought Content:  Denies hallucinations, no delusions expressed  Suicidal Thoughts:  No currently denies suicidal ideations and presents future oriented-at this time contracts for safety on unit  Homicidal Thoughts:  No  Memory:  Recent and remote grossly intact  Judgement:  Fair/improving  Insight:  Fair  Psychomotor Activity:  Normal-no psychomotor agitation or restlessness  Concentration:  Concentration: Good and Attention Span: Good  Recall:  Good  Fund of Knowledge:  Good  Language:  Good  Akathisia:  Negative  Handed:  Right  AIMS (if indicated):     Assets:  Communication Skills Desire for Improvement Resilience  ADL's:  Intact  Cognition:  WNL  Sleep: Reports sleeping is improved   Assessment: 34 year old male, lives with parents, known to staff from recent admission to [facility] earlier this month.  Presented for suicide attempt by overdose (on Vraylar) on 7/27.  He reports overdose as impulsive/unplanned but does acknowledge depression and intermittent suicidal ideations leading up to event.  He reports neurovegetative symptoms of depression.  Currently denies psychotic symptoms, but presents vaguely guarded/suspicious on approach.  He endorses living with parents/unemployment/limited daily structured activities/purpose as major stressor. *Of note, states Vraylar had recently been prescribed by his outpatient psychiatrist but that he had not yet started taking this medication prior to the overdose.  Today patient presents alert, attentive, in no acute distress.  Denies any residual physical symptoms related to recent regular overdose, which occurred on 7/26. He continues to endorse depression, anhedonia.  Today denies suicidal ideations and presents future oriented, interested in going to a longer  term residential setting following discharge from [facility]. He is currently tolerating Wellbutrin XL/lamotrigine well.  We have discussed medication options.  He is interested in trying Vraylar, which had recently been prescribed by his outpatient psychiatrist but which he had not yet started taking prior to above-mentioned overdose.  Treatment Plan Summary: Daily contact with patient to assess and evaluate symptoms and progress in treatment, Medication management, Plan As tolerated and Regular Encourage group and milieu participation Continue Wellbutrin XL 150 mg daily for depression Continue lamotrigine 20 mg daily for mood disorder/depression Continue supplement ( fish oil/omega -3, vitamin D) Start Vraylar 1.5 mg daily for mood disorder in a.m.-side effects reviewed Treatment team working on disposition planning options.  Patient is currently interested in going to a longer term  residential setting following discharge and is in the process of applying to [facility], MD 05/19/2020, 3:16 PM

## 2020-05-19 NOTE — [facility] Counselor (Signed)
CSW received a return call from [place], [phone] at the [facility].  He reports that patient has been APPROVED.  He reports that he will call the family to discuss finances and next steps for moving forward.  He reports that he will need a tentative discharge date to proceed on our end.  Currently have immediate availability.    Penni Homans, MSW, LCSW 05/19/2020 4:11 PM

## 2020-05-19 NOTE — Plan of Care (Signed)
Progress note  D: pt found on the phone; compliant with medication administration. Pt did decline their vitamin C and multivitamin. Pt denies any physical complaints or pain. Pt does seem anxious, irritable, and pensive on approach. Pt can be guarded and minimal. Pt seems superficial at times. Pt seems to have poor insight into coming to the hospital and the necessity. Pt is still blaming their parents. Pt states they are planning to discharge to an inpatient facility which they feel is the best for them. Pt denies si/hi/ah/vh and verbally agrees to approach staff if these become apparent or before harming themself/others while at [facility].  A: Pt provided support and encouragement. Pt given medication per protocol and standing orders. Q1m safety checks implemented and continued.  R: Pt safe on the unit. Will continue to monitor.  Pt progressing in the following metrics  Problem: Coping: Goal: Ability to identify and develop effective coping behavior will improve Outcome: Progressing Goal: Ability to interact with others will improve Outcome: Progressing Goal: Demonstration of participation in decision-making regarding own care will improve Outcome: Progressing   Problem: Health Behavior/Discharge Planning: Goal: Identification of resources available to assist in meeting health care needs will improve Outcome: Progressing

## 2020-05-20 DIAGNOSIS — F333 Major depressive disorder, recurrent, severe with psychotic symptoms: Secondary | ICD-10-CM

## 2020-05-20 NOTE — Progress Notes (Signed)
Pt attend wrap up group AA. 

## 2020-05-20 NOTE — Progress Notes (Signed)
Patient presents with a flat affect and depressed mood. He reports feeling increasingly depressed and anxious this morning, 7/10 (10 being worst). He reports having suicidal ideations with no plan or intent. He verbally contracts for safety. He denies HI. He denies AVH. He reports poor sleep due to waking up throughout the night. He has minimal interaction on the unit and with his peers.   Orders reviewed with the pt.  Verbal support provided. Pt encouraged to attend groups. 15 minute checks performed for safety.   Pt compliant with treatment plan and denies having any side effects to the medications.

## 2020-05-20 NOTE — Progress Notes (Signed)
Adult Psychoeducational Group Note  Date:  05/20/2020 Time:  9:06 PM  Group Topic/Focus:  Wrap-Up Group:   The focus of this group is to help patients review their daily goal of treatment and discuss progress on daily workbooks.  Participation Level:  Active  Participation Quality:  Appropriate  Affect:  Appropriate  Cognitive:  Appropriate  Insight: Appropriate  Engagement in Group:  Engaged  Modes of Intervention:  Discussion  Additional Comments:  Patient attended group & participated.   Alquan Morrish W Lesli Issa 05/20/2020, 9:06 PM

## 2020-05-20 NOTE — Progress Notes (Signed)
   05/20/20 2142  COVID-19 Daily Checkoff  Have you had a fever (temp > 37.80C/100F)  in the past 24 hours?  No  If you have had runny nose, nasal congestion, sneezing in the past 24 hours, has it worsened? No  COVID-19 EXPOSURE  Have you traveled outside the state in the past 14 days? No  Have you been in contact with someone with a confirmed diagnosis of COVID-19 or PUI in the past 14 days without wearing appropriate PPE? No  Have you been living in the same home as a person with confirmed diagnosis of COVID-19 or a PUI (household contact)? No  Have you been diagnosed with COVID-19? No

## 2020-05-20 NOTE — Progress Notes (Signed)
   05/20/20 2144  Psych Admission Type (Psych Patients Only)  Admission Status Involuntary  Psychosocial Assessment  Patient Complaints Depression  Eye Contact Fair  Facial Expression Flat  Affect Appropriate to circumstance  Speech Logical/coherent  Interaction Minimal;Forwards little  Motor Activity Other (Comment) (WDL)  Appearance/Hygiene Unremarkable  Behavior Characteristics Appropriate to situation  Mood Depressed  Thought Process  Coherency Concrete thinking  Content WDL  Delusions None reported or observed  Perception WDL  Hallucination None reported or observed  Judgment Poor  Confusion None  Danger to Self  Current suicidal ideation? Passive  Self-Injurious Behavior No self-injurious ideation or behavior indicators observed or expressed   Agreement Not to Harm Self Yes  Description of Agreement verbally contracts for safety  Danger to Others  Danger to Others None reported or observed

## 2020-05-20 NOTE — [facility] Group Notes (Signed)
Adult Psychoeducational Group Note  Date:  05/20/2020 Time:  12:08 PM  Group Topic/Focus:  Goals Group:   The focus of this group is to help patients establish daily goals to achieve during treatment and discuss how the patient can incorporate goal setting into their daily lives to aide in recovery.  Participation Level:  Did Not Attend   [name] 05/20/2020, 12:08 PM

## 2020-05-20 NOTE — Progress Notes (Signed)
[facility] MD Progress Note  05/20/2020 1:58 PM Adam Larsen.  MRN:  [id number] Subjective:  " I am glad I came in.  I participate in groups.  I think I am learning coping skills" Male, 34 years old seen this morning foe daily assessment.  He was admitted 3 days ago for suicide attempt by OD on Vraylar.  This is his second inpatient Psychiatric hospitalization in the unit this month.  He reports that his current OD was impulsive act although he reports increase in his depressive feeling.  His last hospitalization was OD on [place] [facility] and [place].  Today he reports feeling some better, denies suicide ideation and no paranoia.  He is participating in all group activities.  We discussed the need for outpatient counseling and reported past counseling that was helpful but expensive,  they did not take insurance and he hated to see his parents pay out of pocket.  We discussed asking the SW to assist him in securing counseling with a group that will accept his insurance.  He reported poor sleep last night because his roommate was moving around last night, opening and closing door to the bathroom and making noise.  We will consider Trazodone for sleep.  He denies SI/HI/AVH and no paranoia. Principal Problem: <principal problem not specified> Diagnosis: Active Problems:   MDD (major depressive disorder)  Total Time spent with patient: 20 minutes  Past Psychiatric History:  ADHD, Bipolar disorder  Past Medical History:  Past Medical History:  Diagnosis Date  . Anxiety   . Bipolar 1 disorder (HCC)   . Depression   . Mania (HCC)   . Suicidal ideations     Past Surgical History:  Procedure Laterality Date  . WISDOM TOOTH EXTRACTION     Family History:  Family History  Problem Relation Age of Onset  . Schizophrenia Paternal Grandmother    Family Psychiatric  History: Schizophrenia-paternal grandfather Social History:  Social History   Substance and Sexual Activity  Alcohol Use Never     Social  History   Substance and Sexual Activity  Drug Use Never    Social History   Socioeconomic History  . Marital status: Single    Spouse name: Not on file  . Number of children: 0  . Years of education: 10 or 23  . Highest education level: 11th grade  Occupational History  . Not on file  Tobacco Use  . Smoking status: Current Every Day Smoker    Years: 10.00    Types: E-cigarettes  . Smokeless tobacco: Never Used  Vaping Use  . Vaping Use: Every day  . Substances: Nicotine, Flavoring, Nicotine-salt  Substance and Sexual Activity  . Alcohol use: Never  . Drug use: Never  . Sexual activity: Not Currently  Other Topics Concern  . Not on file  Social History Narrative   Pt lives with his parents. States he has been diagnosed with ADHD, dyslexia, dysgraphia and learning disabilities. Finished 10 th or 11 th grade but is working on his GED through an online high school program. Pt is currently unemployed.   Social Determinants of Health   Financial Resource Strain:   . Difficulty of Paying Living Expenses:   Food Insecurity:   . Worried About Programme researcher, broadcasting/film/video in the Last Year:   . Barista in the Last Year:   Transportation Needs:   . Freight forwarder (Medical):   Marland Kitchen Lack of Transportation (Non-Medical):   Physical Activity:   .  Days of Exercise per Week:   . Minutes of Exercise per Session:   Stress:   . Feeling of Stress :   Social Connections:   . Frequency of Communication with Friends and Family:   . Frequency of Social Gatherings with Friends and Family:   . Attends Religious Services:   . Active Member of Clubs or Organizations:   . Attends Banker Meetings:   Marland Kitchen Marital Status:    Additional Social History:                         Sleep: Poor  Appetite:  Good  Current Medications: Current Facility-Administered Medications  Medication Dose Route Frequency Provider Last Rate Last Admin  . ascorbic acid (VITAMIN C)  tablet 2,000 mg  2,000 mg Oral Daily Denzil Magnuson, NP   2,000 mg at 05/18/20 0807  . buPROPion (WELLBUTRIN XL) 24 hr tablet 150 mg  150 mg Oral Daily Cobos, Rockey Situ, MD   150 mg at 05/20/20 0747  . cariprazine (VRAYLAR) capsule 1.5 mg  1.5 mg Oral Daily Cobos, Rockey Situ, MD   1.5 mg at 05/20/20 0747  . lamoTRIgine (LAMICTAL) tablet 200 mg  200 mg Oral QHS Denzil Magnuson, NP   200 mg at 05/19/20 2117  . multivitamin with minerals tablet 1 tablet  1 tablet Oral Daily Denzil Magnuson, NP      . nicotine (NICODERM CQ - dosed in mg/24 hours) patch 21 mg  21 mg Transdermal Daily Cobos, Rockey Situ, MD   21 mg at 05/20/20 0748  . omega-3 acid ethyl esters (LOVAZA) capsule 1 g  1 g Oral BID Denzil Magnuson, NP   1 g at 05/20/20 0747  . Vitamin D3 (Vitamin D) tablet 2,000 Units  2,000 Units Oral Daily Denzil Magnuson, NP   2,000 Units at 05/20/20 0747    Lab Results: No results found for this or any previous visit (from the past 48 hour(s)).  Blood Alcohol level:  Lab Results  Component Value Date   ETH <10 05/16/2020   ETH <10 04/19/2020    Metabolic Disorder Labs: Lab Results  Component Value Date   HGBA1C 5.1 04/21/2020   MPG 99.67 04/21/2020   No results found for: PROLACTIN Lab Results  Component Value Date   CHOL 150 04/21/2020   TRIG 71 04/21/2020   HDL 47 04/21/2020   CHOLHDL 3.2 04/21/2020   VLDL 14 04/21/2020   LDLCALC 89 04/21/2020    Physical Findings: AIMS: Facial and Oral Movements Muscles of Facial Expression: None, normal Lips and Perioral Area: None, normal Jaw: None, normal Tongue: None, normal,Extremity Movements Upper (arms, wrists, hands, fingers): None, normal Lower (legs, knees, ankles, toes): None, normal, Trunk Movements Neck, shoulders, hips: None, normal, Overall Severity Severity of abnormal movements (highest score from questions above): None, normal Incapacitation due to abnormal movements: None, normal Patient's awareness of abnormal  movements (rate only patient's report): No Awareness, Dental Status Current problems with teeth and/or dentures?: No Does patient usually wear dentures?: No  CIWA:    COWS:     Musculoskeletal: Strength & Muscle Tone: within normal limits Gait & Station: normal Patient leans: N/A  Psychiatric Specialty Exam: Physical Exam  Review of Systems  Constitutional: Negative.   HENT: Negative.   Eyes: Negative.   Respiratory: Negative.   Cardiovascular: Negative.   Gastrointestinal: Negative.   Endocrine: Negative.   Genitourinary: Negative.   Musculoskeletal: Negative.   Allergic/Immunologic: Negative.  Neurological: Negative.   Hematological: Negative.   Psychiatric/Behavioral: Negative.     Blood pressure (!) 128/93, pulse 88, temperature 97.8 F (36.6 C), temperature source Oral, resp. rate 16, height 5\' 11"  (1.803 m), weight 88 kg, SpO2 98 %.Body mass index is 27.06 kg/m.  General Appearance: Casual  Eye Contact:  Good  Speech:  Normal Rate  Volume:  Normal  Mood:  Depressed  Affect:  Remains restricted and vaguely irritable, partially improved compared to admission  Thought Process:  Linear and Descriptions of Associations: Intact  Orientation:  Full (Time, Place, and Person)  Thought Content:  Denies hallucinations, no delusions expressed  Suicidal Thoughts:  No currently denies suicidal ideations and presents future oriented-at this time contracts for safety on unit  Homicidal Thoughts:  No  Memory:  Recent and remote grossly intact  Judgement:  Fair/improving  Insight:  Fair  Psychomotor Activity:  Normal-no psychomotor agitation or restlessness  Concentration:  Concentration: Good and Attention Span: Good  Recall:  Good  Fund of Knowledge:  Good  Language:  Good  Akathisia:  Negative  Handed:  Right  AIMS (if indicated):     Assets:  Communication Skills Desire for Improvement Resilience  ADL's:  Intact  Cognition:  WNL    Sleep:  Number of Hours: 4.5      Treatment Plan Summary: Daily contact with patient to assess and evaluate symptoms and progress in treatment, Medication management and Plan Continue taking all prescribed medications   -Mood stabilization/Depression-Continue taking Lamictal 200 mg po at bed time and BupropionXL 24hr 150 mg po daily Mood control- Continue taking Vraylar Capsule 1.5 mg po daily Nicotine craving-Take Nicotine -Nicoderm CQ patch 21 mg /24 hours daily 04-08-1983, NP 05/20/2020, 1:58 PM

## 2020-05-21 MED ORDER — CARIPRAZINE HCL 3 MG PO CAPS
3.0000 mg | ORAL_CAPSULE | Freq: Every day | ORAL | Status: DC
  Administered 2020-05-22 – 2020-05-29 (×8): 3 mg via ORAL
  Filled 2020-05-21 (×3): qty 1
  Filled 2020-05-21: qty 7
  Filled 2020-05-21 (×2): qty 1
  Filled 2020-05-21: qty 7
  Filled 2020-05-21 (×4): qty 1

## 2020-05-21 MED ORDER — CARIPRAZINE HCL 1.5 MG PO CAPS
1.5000 mg | ORAL_CAPSULE | Freq: Every day | ORAL | Status: AC
  Administered 2020-05-21: 1.5 mg via ORAL
  Filled 2020-05-21: qty 1

## 2020-05-21 NOTE — [facility] Group Notes (Signed)
Adult Psychoeducational Group Note  Date:  05/21/2020 Time:  12:41 PM  Group Topic/Focus:  PROGRESSIVE RELAXATION. A group where deep breathing is taught and tensing and relaxation muscle groups is used. Imagery is used as well.  Pts are asked to imagine 3 pillars that hold them up when they are not able to hold themselves up.    Participation Level:  Did Not Attend   Additional Comments:  Pt shared with the NP that he did not come to group because in group he is asked to say his name. Had a 1:1 with Pt. About his withdrawal from group and that he can tell the leader of the group that he does not want to speak, that he would rather just sit and listen. States when anyone asks him to speak up he becomes self conscious and doesn't want to. States, "I am very acward in a group setting, it makes me feel uncomfortable" Provided with active listening, and eduction that he does not have to say anything and can just listen.  [name] 05/21/2020, 12:41 PM

## 2020-05-21 NOTE — Progress Notes (Signed)
Adult Psychoeducational Group Note  Date:  05/21/2020 Time:  9:08 PM  Group Topic/Focus:  Wrap-Up Group:   The focus of this group is to help patients review their daily goal of treatment and discuss progress on daily workbooks.  Participation Level:  Active  Participation Quality:  Appropriate  Affect:  Appropriate  Cognitive:  Appropriate  Insight: Appropriate  Engagement in Group:  Engaged  Modes of Intervention:  Discussion  Additional Comments:  Patient attended group and participated.  Brittanya Winburn W Ragan Reale 05/21/2020, 9:08 PM

## 2020-05-21 NOTE — Progress Notes (Signed)
[name] Progress Note  05/21/2020 11:36 AM Adam Larsen.  MRN:  [id number] Subjective:  " I am feeling bettereveryday" Male, 34 years old seen this morning for daily assessment.  He reports mood is improving and states he is feeling better today than yesterday.  He remains compliant with medications and participates in few groups although he does not want his name called up in group.  No agitation or violent behavior noted.  He continues to deny suicide thought or ideation.  He eats and sleep well and is seen visibly interacting with peers and staff.  We will increase his Vraylar. Principal Problem: <principal problem not specified> Diagnosis: Active Problems:   MDD (major depressive disorder)  Total Time spent with patient: 20 minutes  Past Psychiatric History:  ADHD, Bipolar disorder  Past Medical History:  Past Medical History:  Diagnosis Date  . Anxiety   . Bipolar 1 disorder (HCC)   . Depression   . Mania (HCC)   . Suicidal ideations     Past Surgical History:  Procedure Laterality Date  . WISDOM TOOTH EXTRACTION     Family History:  Family History  Problem Relation Age of Onset  . Schizophrenia Paternal Grandmother    Family Psychiatric  History: Schizophrenia-paternal grandfather Social History:  Social History   Substance and Sexual Activity  Alcohol Use Never     Social History   Substance and Sexual Activity  Drug Use Never    Social History   Socioeconomic History  . Marital status: Single    Spouse name: Not on file  . Number of children: 0  . Years of education: 10 or 20  . Highest education level: 11th grade  Occupational History  . Not on file  Tobacco Use  . Smoking status: Current Every Day Smoker    Years: 10.00    Types: E-cigarettes  . Smokeless tobacco: Never Used  Vaping Use  . Vaping Use: Every day  . Substances: Nicotine, Flavoring, Nicotine-salt  Substance and Sexual Activity  . Alcohol use: Never  . Drug use: Never  . Sexual  activity: Not Currently  Other Topics Concern  . Not on file  Social History Narrative   Pt lives with his parents. States he has been diagnosed with ADHD, dyslexia, dysgraphia and learning disabilities. Finished 10 th or 11 th grade but is working on his GED through an online high school program. Pt is currently unemployed.   Social Determinants of Health   Financial Resource Strain:   . Difficulty of Paying Living Expenses:   Food Insecurity:   . Worried About Programme researcher, broadcasting/film/video in the Last Year:   . Barista in the Last Year:   Transportation Needs:   . Freight forwarder (Medical):   Marland Kitchen Lack of Transportation (Non-Medical):   Physical Activity:   . Days of Exercise per Week:   . Minutes of Exercise per Session:   Stress:   . Feeling of Stress :   Social Connections:   . Frequency of Communication with Friends and Family:   . Frequency of Social Gatherings with Friends and Family:   . Attends Religious Services:   . Active Member of Clubs or Organizations:   . Attends Banker Meetings:   Marland Kitchen Marital Status:    Additional Social History:                         Sleep: Poor  Appetite:  Good  Current Medications: Current Facility-Administered Medications  Medication Dose Route Frequency Provider Last Rate Last Admin  . ascorbic acid (VITAMIN C) tablet 2,000 mg  2,000 mg Oral Daily Denzil Magnuson, NP   2,000 mg at 05/18/20 0807  . buPROPion (WELLBUTRIN XL) 24 hr tablet 150 mg  150 mg Oral Daily Cobos, Rockey Situ, MD   150 mg at 05/21/20 0742  . [START ON 05/22/2020] cariprazine (VRAYLAR) capsule 3 mg  3 mg Oral Daily Anatole Apollo C, NP      . lamoTRIgine (LAMICTAL) tablet 200 mg  200 mg Oral QHS Denzil Magnuson, NP   200 mg at 05/20/20 2109  . multivitamin with minerals tablet 1 tablet  1 tablet Oral Daily Denzil Magnuson, NP      . nicotine (NICODERM CQ - dosed in mg/24 hours) patch 21 mg  21 mg Transdermal Daily Cobos, Rockey Situ, MD    21 mg at 05/21/20 0744  . omega-3 acid ethyl esters (LOVAZA) capsule 1 g  1 g Oral BID Denzil Magnuson, NP   1 g at 05/21/20 0743  . Vitamin D3 (Vitamin D) tablet 2,000 Units  2,000 Units Oral Daily Denzil Magnuson, NP   2,000 Units at 05/21/20 6962    Lab Results: No results found for this or any previous visit (from the past 48 hour(s)).  Blood Alcohol level:  Lab Results  Component Value Date   ETH <10 05/16/2020   ETH <10 04/19/2020    Metabolic Disorder Labs: Lab Results  Component Value Date   HGBA1C 5.1 04/21/2020   MPG 99.67 04/21/2020   No results found for: PROLACTIN Lab Results  Component Value Date   CHOL 150 04/21/2020   TRIG 71 04/21/2020   HDL 47 04/21/2020   CHOLHDL 3.2 04/21/2020   VLDL 14 04/21/2020   LDLCALC 89 04/21/2020    Physical Findings: AIMS: Facial and Oral Movements Muscles of Facial Expression: None, normal Lips and Perioral Area: None, normal Jaw: None, normal Tongue: None, normal,Extremity Movements Upper (arms, wrists, hands, fingers): None, normal Lower (legs, knees, ankles, toes): None, normal, Trunk Movements Neck, shoulders, hips: None, normal, Overall Severity Severity of abnormal movements (highest score from questions above): None, normal Incapacitation due to abnormal movements: None, normal Patient's awareness of abnormal movements (rate only patient's report): No Awareness, Dental Status Current problems with teeth and/or dentures?: No Does patient usually wear dentures?: No  CIWA:    COWS:     Musculoskeletal: Strength & Muscle Tone: within normal limits Gait & Station: normal Patient leans: N/A  Psychiatric Specialty Exam: Physical Exam  Review of Systems  Constitutional: Negative.   HENT: Negative.   Eyes: Negative.   Respiratory: Negative.   Cardiovascular: Negative.   Gastrointestinal: Negative.   Endocrine: Negative.   Genitourinary: Negative.   Musculoskeletal: Negative.   Allergic/Immunologic:  Negative.   Neurological: Negative.   Hematological: Negative.   Psychiatric/Behavioral: Negative.     Blood pressure (!) 114/91, pulse 93, temperature 98.6 F (37 C), temperature source Oral, resp. rate 16, height 5\' 11"  (1.803 m), weight 88 kg, SpO2 99 %.Body mass index is 27.06 kg/m.  General Appearance: Casual  Eye Contact:  Good  Speech:  Normal Rate  Volume:  Normal  Mood:  Improving, less depressed  Affect:   Brighter on approach.  Thought Process:  Linear and Descriptions of Associations: Intact  Orientation:  Full (Time, Place, and Person)  Thought Content:  Denies hallucinations, no delusions expressed  Suicidal Thoughts:  Denies  Homicidal Thoughts:  No  Memory:  Recent and remote grossly intact  Judgement:  Fair/improving  Insight:  Fair  Psychomotor Activity:  Normal-no psychomotor agitation or restlessness  Concentration:  Concentration: Good and Attention Span: Good  Recall:  Good  Fund of Knowledge:  Good  Language:  Good  Akathisia:  Negative  Handed:  Right  AIMS (if indicated):     Assets:  Communication Skills Desire for Improvement Resilience  ADL's:  Intact  Cognition:  WNL    Sleep:  Number of Hours: 6.25   Treatment Plan Summary: Daily contact with patient to assess and evaluate symptoms and progress in treatment, Medication management and Plan Continue taking all prescribed medications   -Mood stabilization/Depression-Continue taking Lamictal 200 mg po at bed time and BupropionXL 24hr 150 mg po daily Mood control- Increase  Vraylar Capsule to 3 mg  po daily Nicotine craving-Take Nicotine -Nicoderm CQ patch 21 mg /24 hours daily Earney Navy, NP 05/21/2020, 11:36 AM

## 2020-05-21 NOTE — Progress Notes (Signed)
   05/21/20 2309  Psych Admission Type (Psych Patients Only)  Admission Status Involuntary  Psychosocial Assessment  Patient Complaints Depression  Eye Contact Fair  Facial Expression Flat  Affect Depressed  Speech Logical/coherent  Interaction Minimal;Forwards little  Motor Activity Slow  Appearance/Hygiene Unremarkable  Behavior Characteristics Appropriate to situation  Mood Depressed  Thought Process  Coherency Concrete thinking  Content WDL  Delusions None reported or observed  Perception WDL  Hallucination None reported or observed  Judgment Poor  Confusion None  Danger to Self  Current suicidal ideation? Denies  Self-Injurious Behavior No self-injurious ideation or behavior indicators observed or expressed   Danger to Others  Danger to Others None reported or observed  D: Patient sitting in dayroom watching TV and not engaging with peers.  A: Medications administered as prescribed. Support and encouragement provided as needed.  R: Patient remains safe on the unit. Will continue to monitor for safety and stability.

## 2020-05-21 NOTE — [facility] Group Notes (Signed)
Date:  05/21/2020 Time:  12:57 PM  Group Topic/Focus:  Making Healthy Choices:   The focus of this group is to help patients identify negative/unhealthy choices they were using prior to admission and identify positive/healthier coping strategies to replace them upon discharge.  Participation Level:  Did Not Attend   Zamyiah Tino A 05/21/2020, 12:57 PM  

## 2020-05-21 NOTE — Progress Notes (Signed)
   05/21/20 1000  Psych Admission Type (Psych Patients Only)  Admission Status Involuntary  Psychosocial Assessment  Patient Complaints Anxiety;Depression  Eye Contact Fair  Facial Expression Flat  Affect Depressed  Speech Logical/coherent  Interaction Minimal;Forwards little  Motor Activity Slow  Appearance/Hygiene Unremarkable  Behavior Characteristics Appropriate to situation  Mood Depressed  Thought Process  Coherency Concrete thinking  Content WDL  Delusions None reported or observed  Perception WDL  Hallucination None reported or observed  Judgment Poor  Confusion None  Danger to Self  Current suicidal ideation? Denies  Self-Injurious Behavior No self-injurious ideation or behavior indicators observed or expressed   Danger to Others  Danger to Others None reported or observed

## 2020-05-22 DIAGNOSIS — F332 Major depressive disorder, recurrent severe without psychotic features: Secondary | ICD-10-CM

## 2020-05-22 MED ORDER — BUPROPION HCL ER (XL) 300 MG PO TB24
300.0000 mg | ORAL_TABLET | Freq: Every day | ORAL | Status: DC
  Administered 2020-05-23 – 2020-05-29 (×7): 300 mg via ORAL
  Filled 2020-05-22: qty 1
  Filled 2020-05-22: qty 7
  Filled 2020-05-22 (×2): qty 1
  Filled 2020-05-22: qty 7
  Filled 2020-05-22 (×5): qty 1

## 2020-05-22 NOTE — [facility] Group Notes (Signed)
LCSW Group Therapy Notes  Type of Therapy and Topic: Group Therapy: Healthy Vs. Unhealthy Coping Strategies  Date and Time: 05/22/20 1pm  Participation Level: [facility] PARTICIPATION LEVEL: Active  Description of Group: In this group, patients will be encouraged to explore their healthy and unhealthy coping strategics. Coping strategies are actions that we take to deal with stress, problems, or uncomfortable emotions in our daily lives. Each patient will be challenged to read some scenarios and discuss the unhealthy and healthy coping strategies within those scenarios. Also, each patient will be challenged to describe current healthy and unhealthy strategies that they use in their own lives and discuss the outcomes and barriers to those strategies. This group will be process-oriented, with patients participating in exploration of their own experiences as well as giving and receiving support and challenge from other group members.  Therapeutic Goals: Patient will identify personal healthy and unhealthy coping strategies. Patient will identify healthy and unhealthy coping strategies, in others, through scenarios. Patient will identify expected outcomes of healthy and unhealthy coping strategies. Patient will identify barriers to using healthy coping strategies.  Summary of Patient Progress: Pt is engaged in group. He is able to reflect on his individual coping skills and whether or not they are healthy vs unhealthy. Pt identifies isolation as an unhealthy coping skill for him. He also identifies working out as a Research officer, trade union he would like to try more often.     Therapeutic Modalities:  Cognitive Behavioral Therapy Solution Focused Therapy Motivational Interviewing

## 2020-05-22 NOTE — [facility] Counselor (Signed)
CSW spoke with admissions at [facility]. Admissions stated that family paper work still shows as pending. States that if she receives paper work today then she could do assessment on following day, 05/23/20.

## 2020-05-22 NOTE — [facility] Counselor (Signed)
CSW contacted [facility] at [phone] to check on referral. CSW is informed that Adam Larsen has everything needed from [facility] but is waiting on a family form and financial form from pt's parents.

## 2020-05-22 NOTE — B[facility] Counselor (Signed)
CSW spoke with pt mother and father. Family expresses concerns that pt will not be accepted at facilities due to previous experience with pt being denied. Parents emphasize that they do not want pt to leave too early and express concerns that pt is still suicidal. Family wants door to door transition. They explain they are preferring [facility] at this point. Father states he will send required admission documents today.

## 2020-05-22 NOTE — Progress Notes (Signed)
[facility] MD Progress Note  05/22/2020 11:02 AM Adam Larsen.  MRN:  [id number] Subjective:  "I'm doing alright."  34 year old male, lives with parents, known to staff from recent admission to [facility] earlier this month. Presented for suicide attempt by overdose (on Vraylar) on 7/27. He reports overdose as impulsive/unplanned but does acknowledge depression and intermittent suicidal ideations leading up to event.   [name] has been visible in the hallway today talking on the phone. On assessment he appears flat with minimal speech. He states mood has been improving since admission. He does report continuing depression related to not having employment and living with his parents. He states he lost his last job 5-6 years ago. He reports SI with multiple plans but denies suicidal plan or intent on the unit and contracts for safety. He shows no signs of responding to internal stimuli. He reports sleep is "fair" related to having a roommate. He states Wellbutrin was helpful for his mood in the past, and is asking if dose can be increased.     Principal Problem: <principal problem not specified> Diagnosis: Active Problems:   MDD (major depressive disorder)  Total Time spent with patient: 15 minutes  Past Psychiatric History: See admission H&P  Past Medical History:  Past Medical History:  Diagnosis Date  . Anxiety   . Bipolar 1 disorder (HCC)   . Depression   . Mania (HCC)   . Suicidal ideations     Past Surgical History:  Procedure Laterality Date  . WISDOM TOOTH EXTRACTION     Family History:  Family History  Problem Relation Age of Onset  . Schizophrenia Paternal Grandmother    Family Psychiatric  History: See admission H&P Social History:  Social History   Substance and Sexual Activity  Alcohol Use Never     Social History   Substance and Sexual Activity  Drug Use Never    Social History   Socioeconomic History  . Marital status: Single    Spouse name: Not on file  .  Number of children: 0  . Years of education: 10 or 39  . Highest education level: 11th grade  Occupational History  . Not on file  Tobacco Use  . Smoking status: Current Every Day Smoker    Years: 10.00    Types: E-cigarettes  . Smokeless tobacco: Never Used  Vaping Use  . Vaping Use: Every day  . Substances: Nicotine, Flavoring, Nicotine-salt  Substance and Sexual Activity  . Alcohol use: Never  . Drug use: Never  . Sexual activity: Not Currently  Other Topics Concern  . Not on file  Social History Narrative   Pt lives with his parents. States he has been diagnosed with ADHD, dyslexia, dysgraphia and learning disabilities. Finished 10 th or 11 th grade but is working on his GED through an online high school program. Pt is currently unemployed.   Social Determinants of Health   Financial Resource Strain:   . Difficulty of Paying Living Expenses:   Food Insecurity:   . Worried About Programme researcher, broadcasting/film/video in the Last Year:   . Barista in the Last Year:   Transportation Needs:   . Freight forwarder (Medical):   Marland Kitchen Lack of Transportation (Non-Medical):   Physical Activity:   . Days of Exercise per Week:   . Minutes of Exercise per Session:   Stress:   . Feeling of Stress :   Social Connections:   . Frequency of Communication with Friends  and Family:   . Frequency of Social Gatherings with Friends and Family:   . Attends Religious Services:   . Active Member of Clubs or Organizations:   . Attends Banker Meetings:   Marland Kitchen Marital Status:    Additional Social History:                         Sleep: Fair  Appetite:  Fair  Current Medications: Current Facility-Administered Medications  Medication Dose Route Frequency Provider Last Rate Last Admin  . ascorbic acid (VITAMIN C) tablet 2,000 mg  2,000 mg Oral Daily Denzil Magnuson, NP   2,000 mg at 05/18/20 0807  . [START ON 05/23/2020] buPROPion (WELLBUTRIN XL) 24 hr tablet 300 mg  300 mg Oral  Daily Aldean Baker, NP      . cariprazine (VRAYLAR) capsule 3 mg  3 mg Oral Daily Dahlia Byes C, NP   3 mg at 05/22/20 0828  . lamoTRIgine (LAMICTAL) tablet 200 mg  200 mg Oral QHS Denzil Magnuson, NP   200 mg at 05/21/20 2103  . multivitamin with minerals tablet 1 tablet  1 tablet Oral Daily Denzil Magnuson, NP      . nicotine (NICODERM CQ - dosed in mg/24 hours) patch 21 mg  21 mg Transdermal Daily Cobos, Rockey Situ, MD   21 mg at 05/22/20 0827  . omega-3 acid ethyl esters (LOVAZA) capsule 1 g  1 g Oral BID Denzil Magnuson, NP   1 g at 05/22/20 4854  . Vitamin D3 (Vitamin D) tablet 2,000 Units  2,000 Units Oral Daily Denzil Magnuson, NP   2,000 Units at 05/22/20 6270    Lab Results: No results found for this or any previous visit (from the past 48 hour(s)).  Blood Alcohol level:  Lab Results  Component Value Date   ETH <10 05/16/2020   ETH <10 04/19/2020    Metabolic Disorder Labs: Lab Results  Component Value Date   HGBA1C 5.1 04/21/2020   MPG 99.67 04/21/2020   No results found for: PROLACTIN Lab Results  Component Value Date   CHOL 150 04/21/2020   TRIG 71 04/21/2020   HDL 47 04/21/2020   CHOLHDL 3.2 04/21/2020   VLDL 14 04/21/2020   LDLCALC 89 04/21/2020    Physical Findings: AIMS: Facial and Oral Movements Muscles of Facial Expression: None, normal Lips and Perioral Area: None, normal Jaw: None, normal Tongue: None, normal,Extremity Movements Upper (arms, wrists, hands, fingers): None, normal Lower (legs, knees, ankles, toes): None, normal, Trunk Movements Neck, shoulders, hips: None, normal, Overall Severity Severity of abnormal movements (highest score from questions above): None, normal Incapacitation due to abnormal movements: None, normal Patient's awareness of abnormal movements (rate only patient's report): No Awareness, Dental Status Current problems with teeth and/or dentures?: No Does patient usually wear dentures?: No  CIWA:    COWS:      Musculoskeletal: Strength & Muscle Tone: within normal limits Gait & Station: normal Patient leans: N/A  Psychiatric Specialty Exam: Physical Exam Vitals and nursing note reviewed.  Constitutional:      Appearance: He is well-developed.  Cardiovascular:     Rate and Rhythm: Normal rate.  Pulmonary:     Effort: Pulmonary effort is normal.  Neurological:     Mental Status: He is alert and oriented to person, place, and time.     Review of Systems  Constitutional: Negative.   Respiratory: Negative for cough and shortness of breath.   Psychiatric/Behavioral: Positive  for dysphoric mood and suicidal ideas. Negative for agitation, behavioral problems, confusion, hallucinations, self-injury and sleep disturbance. The patient is not nervous/anxious and is not hyperactive.     Blood pressure (!) 120/91, pulse 92, temperature 98.7 F (37.1 C), temperature source Oral, resp. rate 16, height 5\' 11"  (1.803 m), weight 88 kg, SpO2 99 %.Body mass index is 27.06 kg/m.  General Appearance: Casual  Eye Contact:  Good  Speech:  Normal Rate  Volume:  Normal  Mood:  Depressed  Affect:  Congruent  Thought Process:  Coherent  Orientation:  Full (Time, Place, and Person)  Thought Content:  Logical  Suicidal Thoughts:  Yes.  with intent/plan Denies plan/intent on the unit and contracts for safety.  Homicidal Thoughts:  No  Memory:  Immediate;   Good Recent;   Good Remote;   Good  Judgement:  Intact  Insight:  Fair  Psychomotor Activity:  Normal  Concentration:  Concentration: Good and Attention Span: Good  Recall:  Good  Fund of Knowledge:  Fair  Language:  Good  Akathisia:  No  Handed:  Right  AIMS (if indicated):     Assets:  Communication Skills Desire for Improvement Financial Resources/Insurance Housing Social Support  ADL's:  Intact  Cognition:  WNL  Sleep:  Number of Hours: 6.75     Treatment Plan Summary: Daily contact with patient to assess and evaluate symptoms and  progress in treatment and Medication management   Continue inpatient hospitalization.  Increase Wellbutrin XL to 300 mg PO daily for depression Continue Vraylar 3 mg PO daily for mood Continue Lamictal 200 mg PO QHS for mood Continue Lovaza 1 g PO BID for neuroprotection Continue vitamin D3 2000 units PO daily for supplementation Continue vitamin C 2000 mg PO daily for supplementation  Patient will participate in the therapeutic group milieu.  Discharge disposition in progress.   , NP 05/22/2020, 11:02 AM

## 2020-05-22 NOTE — Progress Notes (Signed)
   05/22/20 0623  Vital Signs  Temp 98.7 F (37.1 C)  Temp Source Oral  Pulse Rate 87  BP 114/73  BP Method Automatic  Oxygen Therapy  SpO2 99 %   D: Patient admits to some passive SI without a plan, but denies HI and AVH.   Patient rates anxiety 5/10 an depression 7/10. Patient refused medicine for anxiety.  A:  Patient took scheduled medicine except vitamin C .  Support and encouragement provided Routine safety checks conducted every 15 minutes. Patient  Informed to notify staff with any concerns.   R:  Patient contracts for safety. Safety maintained.

## 2020-05-23 NOTE — Progress Notes (Signed)
Pt stated he was doing a little better    05/23/20 2000  Psych Admission Type (Psych Patients Only)  Admission Status Involuntary  Psychosocial Assessment  Patient Complaints Anxiety;Depression  Eye Contact Fair  Facial Expression Flat  Affect Depressed  Speech Logical/coherent  Interaction Minimal;Forwards little  Motor Activity Slow  Appearance/Hygiene Unremarkable  Behavior Characteristics Cooperative  Mood Depressed  Thought Process  Coherency Concrete thinking  Content WDL  Delusions None reported or observed  Perception WDL  Hallucination None reported or observed  Judgment Poor  Confusion None  Danger to Self  Current suicidal ideation? Denies  Self-Injurious Behavior No self-injurious ideation or behavior indicators observed or expressed   Danger to Others  Danger to Others None reported or observed

## 2020-05-23 NOTE — BHH Counselor (Signed)
CSW faxed records from previous admission to [facility] [phone]

## 2020-05-23 NOTE — BHH Counselor (Signed)
CSW received email from [employer] admissions staff Kris Mouton. They are requesting additional records specifically records showing he does not need follow up after drinking drano on his last admission. She also requests information regarding pt's reported previous delusions during his assessment. Victorino [email or web address]  She explains that once records received and pt approved, they could likely schedule admission for Monday 05/29/20.

## 2020-05-23 NOTE — Progress Notes (Signed)
Patient isolative to self. Spent most of early shift on telephone. Minimal interaction with staff and peers. Only answered yes or no to questions. Denies any SI, Hi, AVH. Endorses depression. Only requested scheduled night medications. Encouragement and support provided. Safety checks maintained. Medications given as prescribed.  Pt receptive and remains safe on unit with q 15 min checks.

## 2020-05-23 NOTE — Progress Notes (Signed)
[facility] MD Progress Note  05/23/2020 3:24 PM Adam Larsen.  MRN:  [id number] Subjective: He reports some improvement compared to admission.  He acknowledges mood has improved.  He endorses some intermittent suicidal thoughts but contracts for safety on unit .  Of note, he reports he has experienced suicidal ideations "on and off for years". Today presents future oriented, states that he is motivated in going to a longer term residential treatment facility following discharge.  He has a scheduled phone interview with Lolita Lenz later today.   Objective: I have reviewed case with treatment team and I met with patient. Currently he is presenting alert, attentive, calm, polite/cooperative on approach. He appears slightly guarded initially but affect tends to improve partially during session.  He is noticeably less suspicious and guarded than on his previous/recent psychiatric admission psychiatric admission to [facility]. He currently denies medication side effects. Tolerating Wellbutrin XL ( which was titrated to 300 mgrs QDY today) and Vraylar well thus far.  *Patient's parents sent information to Dr. Dwyane Dee who forwarded it to Dr. Mallie Darting and myself.  In summary, with regards to psychiatric medication management history, family reported he did well on Rexulti but unfortunately this medication caused significant weight gain.  He did not do particularly well with either Abilify Latuda or Seroquel.   The patient expressed consent I spoke with his father via phone.  Father acknowledges the patient seems improved compared to admission but expresses concern that he also appeared to be doing better at discharge prior psychiatric admission, but seem to decompensate after he returned to home environment.  Parents are in agreement with disposition plan of going to a longer term residential facility for further inpatient treatment. Behavior on unit has been in good control, no disruptive or agitated behaviors. Has been  visible in dayroom, limited interaction with peers.   Principal Problem:  Depression Diagnosis: Active Problems:   MDD (major depressive disorder)  Total Time spent with patient: 20 minutes  Past Psychiatric History: See admission H&P  Past Medical History:  Past Medical History:  Diagnosis Date  . Anxiety   . Bipolar 1 disorder ([place])   . Depression   . Mania ([place])   . Suicidal ideations     Past Surgical History:  Procedure Laterality Date  . WISDOM TOOTH EXTRACTION     Family History:  Family History  Problem Relation Age of Onset  . Schizophrenia Paternal Grandmother    Family Psychiatric  History: See admission H&P Social History:  Social History   Substance and Sexual Activity  Alcohol Use Never     Social History   Substance and Sexual Activity  Drug Use Never    Social History   Socioeconomic History  . Marital status: Single    Spouse name: Not on file  . Number of children: 0  . Years of education: 10 or 73  . Highest education level: 11th grade  Occupational History  . Not on file  Tobacco Use  . Smoking status: Current Every Day Smoker    Years: 10.00    Types: E-cigarettes  . Smokeless tobacco: Never Used  Vaping Use  . Vaping Use: Every day  . Substances: Nicotine, Flavoring, Nicotine-salt  Substance and Sexual Activity  . Alcohol use: Never  . Drug use: Never  . Sexual activity: Not Currently  Other Topics Concern  . Not on file  Social History Narrative   Pt lives with his parents. States he has been diagnosed with ADHD, dyslexia, dysgraphia  and learning disabilities. Finished 10 th or 11 th grade but is working on his GED through an online high school program. Pt is currently unemployed.   Social Determinants of Health   Financial Resource Strain:   . Difficulty of Paying Living Expenses:   Food Insecurity:   . Worried About Charity fundraiser in the Last Year:   . Arboriculturist in the Last Year:   Transportation Needs:    . Film/video editor (Medical):   Marland Kitchen Lack of Transportation (Non-Medical):   Physical Activity:   . Days of Exercise per Week:   . Minutes of Exercise per Session:   Stress:   . Feeling of Stress :   Social Connections:   . Frequency of Communication with Friends and Family:   . Frequency of Social Gatherings with Friends and Family:   . Attends Religious Services:   . Active Member of Clubs or Organizations:   . Attends Archivist Meetings:   Marland Kitchen Marital Status:    Additional Social History:   Sleep: improving   Appetite:  improving   Current Medications: Current Facility-Administered Medications  Medication Dose Route Frequency Provider Last Rate Last Admin  . buPROPion (WELLBUTRIN XL) 24 hr tablet 300 mg  300 mg Oral Daily Connye Burkitt, NP   300 mg at 05/23/20 0263  . cariprazine (VRAYLAR) capsule 3 mg  3 mg Oral Daily Charmaine Downs C, NP   3 mg at 05/23/20 7858  . lamoTRIgine (LAMICTAL) tablet 200 mg  200 mg Oral QHS Mordecai Maes, NP   200 mg at 05/22/20 2124  . multivitamin with minerals tablet 1 tablet  1 tablet Oral Daily Mordecai Maes, NP      . nicotine (NICODERM CQ - dosed in mg/24 hours) patch 21 mg  21 mg Transdermal Daily , Myer Peer, MD   21 mg at 05/23/20 0813  . omega-3 acid ethyl esters (LOVAZA) capsule 1 g  1 g Oral BID Mordecai Maes, NP   1 g at 05/23/20 8502  . Vitamin D3 (Vitamin D) tablet 2,000 Units  2,000 Units Oral Daily Mordecai Maes, NP   2,000 Units at 05/23/20 0813    Lab Results: No results found for this or any previous visit (from the past 48 hour(s)).  Blood Alcohol level:  Lab Results  Component Value Date   ETH <10 05/16/2020   ETH <10 77/41/2878    Metabolic Disorder Labs: Lab Results  Component Value Date   HGBA1C 5.1 04/21/2020   MPG 99.67 04/21/2020   No results found for: PROLACTIN Lab Results  Component Value Date   CHOL 150 04/21/2020   TRIG 71 04/21/2020   HDL 47 04/21/2020   CHOLHDL  3.2 04/21/2020   VLDL 14 04/21/2020   LDLCALC 89 04/21/2020    Physical Findings: AIMS: Facial and Oral Movements Muscles of Facial Expression: None, normal Lips and Perioral Area: None, normal Jaw: None, normal Tongue: None, normal,Extremity Movements Upper (arms, wrists, hands, fingers): None, normal Lower (legs, knees, ankles, toes): None, normal, Trunk Movements Neck, shoulders, hips: None, normal, Overall Severity Severity of abnormal movements (highest score from questions above): None, normal Incapacitation due to abnormal movements: None, normal Patient's awareness of abnormal movements (rate only patient's report): No Awareness, Dental Status Current problems with teeth and/or dentures?: No Does patient usually wear dentures?: No  CIWA:    COWS:     Musculoskeletal: Strength & Muscle Tone: within normal limits Gait & Station: normal  Patient leans: N/A  Psychiatric Specialty Exam: Physical Exam Vitals and nursing note reviewed.  Constitutional:      Appearance: He is well-developed.  Cardiovascular:     Rate and Rhythm: Normal rate.  Pulmonary:     Effort: Pulmonary effort is normal.  Neurological:     Mental Status: He is alert and oriented to person, place, and time.     Review of Systems  Constitutional: Negative.   Respiratory: Negative for cough and shortness of breath.   Psychiatric/Behavioral: Positive for dysphoric mood and suicidal ideas. Negative for agitation, behavioral problems, confusion, hallucinations, self-injury and sleep disturbance. The patient is not nervous/anxious and is not hyperactive.     Blood pressure (!) 129/97, pulse 94, temperature (!) 97.3 F (36.3 C), temperature source Oral, resp. rate 16, height 5' 11" (1.803 m), weight 88 kg, SpO2 99 %.Body mass index is 27.06 kg/m.  General Appearance: Casual  Eye Contact:  Good  Speech:  Normal Rate  Volume:  Normal  Mood:  partially improved mood   Affect:  vaguely guarded/irritable,  but improves partially during session  Thought Process:  Linear and Descriptions of Associations: Intact  Orientation:  Full (Time, Place, and Person)  Thought Content:  denies hallucinations, no delusions expressed   Suicidal Thoughts: Endorses intermittent suicidal ideations, denies plan or intention and today presents future oriented. Contracts for safety on unit   Homicidal Thoughts:  No  Memory:  recent and remote grossly intact   Judgement:  Other:  improving   Insight:  Fair/ improving   Psychomotor Activity:  Normal- no psychomotor agitation or restlessness   Concentration:  Concentration: Good and Attention Span: Good  Recall:  Good  Fund of Knowledge:  Good  Language:  Good  Akathisia:  No  Handed:  Right  AIMS (if indicated):     Assets:  Communication Skills Desire for Improvement Financial Resources/Insurance Housing Social Support  ADL's:  Intact  Cognition:  WNL  Sleep:  Number of Hours: 6.75   Assessment - Today patient presents alert, attentive, calm, without psychomotor agitation or restlessness.  Mood is described as partially improved.  He does endorse intermittent suicidal ideations, but denies current plan or intention and contracts for safety on unit.  He reports the suicidal ideations have been present intermittently for years.  Currently he presents more future oriented and is expressing motivation and interest in going to a longer term residential setting following discharge.  He has a phone interview at [facility] later today.  Thus far he has tolerated regular/Wellbutrin XL titration well.  Of note currently is denying sedation or other side effects from [place].   Treatment Plan Summary: Daily contact with patient to assess and evaluate symptoms and progress in treatment and Medication management  Treatment plan review as below today 8/3 Continue Wellbutrin XL to 300 mg PO daily for depression Continue Vraylar 3 mg PO daily for mood Continue Lamictal  200 mg PO QHS for mood Continue Lovaza 1 g PO BID for neuroprotection Continue vitamin D3 2000 units PO daily for supplementation Continue vitamin C 2000 mg PO daily for supplementation Treatment team is working on disposition planning options-see above.  Jenne Campus, MD 05/23/2020, 3:24 PM   Patient ID: Adam Larsen., male   DOB: 01-29-1986, 34 y.o.   MRN: [id number]

## 2020-05-23 NOTE — BHH Counselor (Cosign Needed)
Pt completed virtual assessment for [employer]. Excell Seltzer Riis to review and respond following day.

## 2020-05-23 NOTE — Progress Notes (Signed)
Recreation Therapy Notes  Animal-Assisted Activity (AAA) Program Checklist/Progress Notes Patient Eligibility Criteria Checklist & Daily Group note for Rec Tx Intervention  Date: 8.3.21 Time: 1430 Location: 300 Hall Dayroom   AAA/T Program Assumption of Risk Form signed by Patient/ or Parent Legal Guardian  YES  Patient is free of allergies or sever asthma YES   Patient reports no fear of animals  YES   Patient reports no history of cruelty to animals YES   Patient understands his/her participation is voluntary YES  Patient washes hands before animal contact  YES  Patient washes hands after animal contact YES   Behavioral Response: Engaged  Education: Hand Washing, Appropriate Animal Interaction   Education Outcome: Acknowledges understanding/In group clarification offered/Needs additional education.   Clinical Observations/Feedback:  Pt attended and participated in activity.    Adam Larsen, LRT/CTRS         Adam Larsen 05/23/2020 3:19 PM 

## 2020-05-23 NOTE — Progress Notes (Addendum)
Pt attend wrap up group. His day was a 6. His goal was to do an assesment for In-patient facility. He said he achieved his goal. His has not found any coping skills yet it maybe working out.

## 2020-05-23 NOTE — Progress Notes (Signed)
°   05/23/20 0622  Vital Signs  Temp (!) 97.3 F (36.3 C)  Temp Source Oral  Pulse Rate 83  BP (!) 136/100  BP Location Left Arm  BP Method Automatic  Patient Position (if appropriate) Sitting   D: Patient admits to some passive SI, but denies HI and AVH. Patient rates anxiety 5/10 and depression 7/10. Patient attended pharmacy group.  A:  Patient took scheduled medicine.  Support and encouragement provided Routine safety checks conducted every 15 minutes. Patient  Informed to notify staff with any concerns.  R:  Safety maintained.

## 2020-05-23 NOTE — Plan of Care (Signed)
  Problem: Safety: Goal: Periods of time without injury will increase Outcome: Progressing   Problem: Medication: Goal: Compliance with prescribed medication regimen will improve Outcome: Progressing   Problem: Safety: Goal: Ability to disclose and discuss suicidal ideas will improve Outcome: Progressing

## 2020-05-24 NOTE — Progress Notes (Signed)
B[facility] Group Notes:  (Nursing/MHT/Case Management/Adjunct)  Date:  05/24/2020  Time:  2030  Type of Therapy:  wrap up group  Participation Level:  Active  Participation Quality:  Appropriate, Attentive, Sharing and Supportive  Affect:  Appropriate and Irritable  Cognitive:  Appropriate  Insight:  Improving  Engagement in Group:  Engaged  Modes of Intervention:  Clarification, Education and Support  Summary of Progress/Problems: Pt reports being happy about acceptance into a 45-90 day treatment program.  Pt reports good family support and wanting and needing a job after further treatment. Positive thinking and positive change discussed.   Marcille Buffy 05/24/2020, 9:17 PM

## 2020-05-24 NOTE — [facility] Counselor (Signed)
CSW spoke to [place] in admissions at [facility] [phone]) regarding residential placement for this patient. Per Cammy Copa, the clinical team is to meet at 3:30pm to review cases and will then call CSW with determination for this patient.    Ruthann Cancer MSW, LCSW Clincal Social Worker  [facility]

## 2020-05-24 NOTE — Plan of Care (Signed)
Progress note  D: pt found in bed; compliant with medication administration. Pt continues to be fidgety and guarded on approach. Pt denied their multi vitamin stating this upsets their stomach. Pt denies any phsyical complaints besides nicotine withdrawal symptoms. Pt continues to show little insight into their disease process. Pt states they are waiting on a bed for outpatient treatment and they plan to go their once discharged. Pt feels the interview went well. Pt endorses si with no plan. Pt denies hi/ah/vh and verbally agrees to approach staff if these and/or si become apparent or before harming themself/others while at [facility].  A: Pt provided support and encouragement. Pt given medication per protocol and standing orders. Q5m safety checks implemented and continued.  R: Pt safe on the unit. Will continue to monitor.  Pt progressing in the following metrics  Problem: Education: Goal: Ability to make informed decisions regarding treatment will improve Outcome: Progressing   Problem: Coping: Goal: Coping ability will improve Outcome: Progressing   Problem: Health Behavior/Discharge Planning: Goal: Identification of resources available to assist in meeting health care needs will improve Outcome: Progressing   Problem: Medication: Goal: Compliance with prescribed medication regimen will improve Outcome: Progressing   Problem: Self-Concept: Goal: Ability to disclose and discuss suicidal ideas will improve Outcome: Progressing

## 2020-05-24 NOTE — Tx Team (Cosign Needed)
Interdisciplinary Treatment and Diagnostic Plan Update  05/24/2020 Time of Session: 1130a Adam Larsen. MRN: [id number]  Principal Diagnosis: <principal problem not specified>  Secondary Diagnoses: Active Problems:   MDD (major depressive disorder)   Current Medications:  Current Facility-Administered Medications  Medication Dose Route Frequency Provider Last Rate Last Admin  . buPROPion (WELLBUTRIN XL) 24 hr tablet 300 mg  300 mg Oral Daily Aldean Baker, NP   300 mg at 05/24/20 0758  . cariprazine (VRAYLAR) capsule 3 mg  3 mg Oral Daily Onuoha, Josephine C, NP   3 mg at 05/24/20 0758  . lamoTRIgine (LAMICTAL) tablet 200 mg  200 mg Oral QHS Denzil Magnuson, NP   200 mg at 05/23/20 2133  . multivitamin with minerals tablet 1 tablet  1 tablet Oral Daily Denzil Magnuson, NP      . nicotine (NICODERM CQ - dosed in mg/24 hours) patch 21 mg  21 mg Transdermal Daily Cobos, Rockey Situ, MD   21 mg at 05/24/20 0758  . omega-3 acid ethyl esters (LOVAZA) capsule 1 g  1 g Oral BID Denzil Magnuson, NP   1 g at 05/24/20 0758  . Vitamin D3 (Vitamin D) tablet 2,000 Units  2,000 Units Oral Daily Denzil Magnuson, NP   2,000 Units at 05/24/20 0758   PTA Medications: Medications Prior to Admission  Medication Sig Dispense Refill Last Dose  . Ascorbic Acid (VITAMIN C) 1000 MG tablet Take 2,000 mg by mouth daily.     Marland Kitchen atomoxetine (STRATTERA) 18 MG capsule Take 2 capsules (36 mg total) by mouth daily. 60 capsule 2   . cariprazine (VRAYLAR) capsule Take 1 capsule (3 mg total) by mouth daily. 30 capsule 1   . chlorproMAZINE (THORAZINE) 25 MG tablet Take 1 tablet (25 mg total) by mouth 2 (two) times daily. 60 tablet 2   . cholecalciferol (VITAMIN D3) 25 MCG (1000 UNIT) tablet Take 2,000 Units by mouth daily.     Marland Kitchen lamoTRIgine (LAMICTAL) 200 MG tablet Take 1 tablet (200 mg total) by mouth at bedtime. 30 tablet 2   . Multiple Vitamin (MULTIVITAMIN) capsule Take 1 capsule by mouth daily.     . Omega-3  Fatty Acids (FISH OIL) 1000 MG CAPS Take 1,000 mg by mouth daily.       Patient Stressors: Marital or family conflict Medication change or noncompliance  Patient Strengths: Ability for insight Physical Health Supportive family/friends  Treatment Modalities: Medication Management, Group therapy, Case management,  1 to 1 session with clinician, Psychoeducation, Recreational therapy.   Physician Treatment Plan for Primary Diagnosis: <principal problem not specified> Long Term Goal(s): Improvement in symptoms so as ready for discharge Improvement in symptoms so as ready for discharge   Short Term Goals: Ability to identify changes in lifestyle to reduce recurrence of condition will improve Ability to verbalize feelings will improve Ability to disclose and discuss suicidal ideas Ability to demonstrate self-control will improve Ability to identify and develop effective coping behaviors will improve Ability to maintain clinical measurements within normal limits will improve Compliance with prescribed medications will improve Ability to identify triggers associated with substance abuse/mental health issues will improve Ability to identify changes in lifestyle to reduce recurrence of condition will improve Ability to verbalize feelings will improve Ability to disclose and discuss suicidal ideas Ability to demonstrate self-control will improve Ability to identify and develop effective coping behaviors will improve Ability to maintain clinical measurements within normal limits will improve Compliance with prescribed medications will improve  Medication Management:  Evaluate patient's response, side effects, and tolerance of medication regimen.  Therapeutic Interventions: 1 to 1 sessions, Unit Group sessions and Medication administration.  Evaluation of Outcomes: Progressing  Physician Treatment Plan for Secondary Diagnosis: Active Problems:   MDD (major depressive disorder)  Long Term  Goal(s): Improvement in symptoms so as ready for discharge Improvement in symptoms so as ready for discharge   Short Term Goals: Ability to identify changes in lifestyle to reduce recurrence of condition will improve Ability to verbalize feelings will improve Ability to disclose and discuss suicidal ideas Ability to demonstrate self-control will improve Ability to identify and develop effective coping behaviors will improve Ability to maintain clinical measurements within normal limits will improve Compliance with prescribed medications will improve Ability to identify triggers associated with substance abuse/mental health issues will improve Ability to identify changes in lifestyle to reduce recurrence of condition will improve Ability to verbalize feelings will improve Ability to disclose and discuss suicidal ideas Ability to demonstrate self-control will improve Ability to identify and develop effective coping behaviors will improve Ability to maintain clinical measurements within normal limits will improve Compliance with prescribed medications will improve     Medication Management: Evaluate patient's response, side effects, and tolerance of medication regimen.  Therapeutic Interventions: 1 to 1 sessions, Unit Group sessions and Medication administration.  Evaluation of Outcomes: Progressing   RN Treatment Plan for Primary Diagnosis: <principal problem not specified> Long Term Goal(s): Knowledge of disease and therapeutic regimen to maintain health will improve  Short Term Goals: Ability to remain free from injury will improve, Ability to identify and develop effective coping behaviors will improve and Compliance with prescribed medications will improve  Medication Management: RN will administer medications as ordered by provider, will assess and evaluate patient's response and provide education to patient for prescribed medication. RN will report any adverse and/or side effects  to prescribing provider.  Therapeutic Interventions: 1 on 1 counseling sessions, Psychoeducation, Medication administration, Evaluate responses to treatment, Monitor vital signs and CBGs as ordered, Perform/monitor CIWA, COWS, AIMS and Fall Risk screenings as ordered, Perform wound care treatments as ordered.  Evaluation of Outcomes: Progressing   LCSW Treatment Plan for Primary Diagnosis: <principal problem not specified> Long Term Goal(s): Safe transition to appropriate next level of care at discharge, Engage patient in therapeutic group addressing interpersonal concerns.  Short Term Goals: Engage patient in aftercare planning with referrals and resources, Increase social support, Identify triggers associated with mental health/substance abuse issues and Increase skills for wellness and recovery  Therapeutic Interventions: Assess for all discharge needs, 1 to 1 time with Social worker, Explore available resources and support systems, Assess for adequacy in community support network, Educate family and significant other(s) on suicide prevention, Complete Psychosocial Assessment, Interpersonal group therapy.  Evaluation of Outcomes: Progressing   Progress in Treatment: Attending groups: Yes. Participating in groups: Yes. Taking medication as prescribed: Yes. Toleration medication: Yes. Family/Significant other contact made: Yes, individual(s) contacted:  Mother and father Patient understands diagnosis: Yes. Discussing patient identified problems/goals with staff: Yes. Medical problems stabilized or resolved: Yes. Denies suicidal/homicidal ideation: Yes. Issues/concerns per patient self-inventory: No. Other:   New problem(s) identified: No, Describe:  none  New Short Term/Long Term Goal(s):  Patient Goals:  "Get on the right track" and get into residential [facility] treatment  Discharge Plan or Barriers:   Reason for Continuation of Hospitalization: Depression Medication  stabilization  Estimated Length of Stay: 1-3 days  Attendees: Patient:  05/24/2020  Physician:  05/24/2020   Nursing:  05/24/2020   RN Care Manager: 05/24/2020   Social Worker: Ruthann Cancer, LCSW 05/24/2020  Recreational Therapist:  05/24/2020  Other:  05/24/2020   Other:  05/24/2020   Other: 05/24/2020       Scribe for Treatment Team: Otelia Santee, LCSW 05/24/2020 11:01 AM

## 2020-05-24 NOTE — Progress Notes (Signed)
**Note Adam-Identified via Obfuscation** [facility] MD Progress Note  05/24/2020 4:55 PM Adam Coralee Rud.  MRN:  [id number] Subjective: patient reports he is feeling " all right". He states he is pleased that he found out he has been accepted to [facility] ( with bed available next Monday). He states he is hoping he can go home to his parents for 1-2 days prior to his discharge , but that his parents are reluctant, and prefer for him to go directly from inpatient unit. States " I guess they are concerned I might do something again" Denies medication side effects. Currently denies suicidal ideations.   Objective: I have reviewed case with treatment team and I met with patient.  Today patient is presenting alert, attentive, and presents calm and without restlessness or agitation. Currently denies SI and presents future oriented, stating he has been accepted to [facility], as above, and hoping he will be able to go home for 1-2 days before entering said program, which is a long term residential treatment setting. Denies medication side effects. Mood presents with partial improvement and affect has gradually improved, noted to be more reactive and less irritable  or guarded. Currently denies SI and presents future oriented , expressing motivation in going to [facility].    Principal Problem:  Depression Diagnosis: Active Problems:   MDD (major depressive disorder)  Total Time spent with patient: 15 minutes  Past Psychiatric History: See admission H&P  Past Medical History:  Past Medical History:  Diagnosis Date  . Anxiety   . Bipolar 1 disorder ([place])   . Depression   . Mania ([place])   . Suicidal ideations     Past Surgical History:  Procedure Laterality Date  . WISDOM TOOTH EXTRACTION     Family History:  Family History  Problem Relation Age of Onset  . Schizophrenia Paternal Grandmother    Family Psychiatric  History: See admission H&P Social History:  Social History   Substance and Sexual Activity  Alcohol  Use Never     Social History   Substance and Sexual Activity  Drug Use Never    Social History   Socioeconomic History  . Marital status: Single    Spouse name: Not on file  . Number of children: 0  . Years of education: 10 or 6  . Highest education level: 11th grade  Occupational History  . Not on file  Tobacco Use  . Smoking status: Current Every Day Smoker    Years: 10.00    Types: E-cigarettes  . Smokeless tobacco: Never Used  Vaping Use  . Vaping Use: Every day  . Substances: Nicotine, Flavoring, Nicotine-salt  Substance and Sexual Activity  . Alcohol use: Never  . Drug use: Never  . Sexual activity: Not Currently  Other Topics Concern  . Not on file  Social History Narrative   Pt lives with his parents. States he has been diagnosed with ADHD, dyslexia, dysgraphia and learning disabilities. Finished 10 th or 11 th grade but is working on his GED through an online high school program. Pt is currently unemployed.   Social Determinants of Health   Financial Resource Strain:   . Difficulty of Paying Living Expenses:   Food Insecurity:   . Worried About Charity fundraiser in the Last Year:   . Arboriculturist in the Last Year:   Transportation Needs:   . Film/video editor (Medical):   Marland Kitchen Lack of Transportation (Non-Medical):   Physical Activity:   . Days  of Exercise per Week:   . Minutes of Exercise per Session:   Stress:   . Feeling of Stress :   Social Connections:   . Frequency of Communication with Friends and Family:   . Frequency of Social Gatherings with Friends and Family:   . Attends Religious Services:   . Active Member of Clubs or Organizations:   . Attends Archivist Meetings:   Marland Kitchen Marital Status:    Additional Social History:   Sleep: Good  Appetite:  Good  Current Medications: Current Facility-Administered Medications  Medication Dose Route Frequency Provider Last Rate Last Admin  . buPROPion (WELLBUTRIN XL) 24 hr tablet  300 mg  300 mg Oral Daily Connye Burkitt, NP   300 mg at 05/24/20 0758  . cariprazine (VRAYLAR) capsule 3 mg  3 mg Oral Daily Onuoha, Josephine C, NP   3 mg at 05/24/20 0758  . lamoTRIgine (LAMICTAL) tablet 200 mg  200 mg Oral QHS Mordecai Maes, NP   200 mg at 05/23/20 2133  . multivitamin with minerals tablet 1 tablet  1 tablet Oral Daily Mordecai Maes, NP      . nicotine (NICODERM CQ - dosed in mg/24 hours) patch 21 mg  21 mg Transdermal Daily Fischer Halley, Myer Peer, MD   21 mg at 05/24/20 0758  . omega-3 acid ethyl esters (LOVAZA) capsule 1 g  1 g Oral BID Mordecai Maes, NP   1 g at 05/24/20 1618  . Vitamin D3 (Vitamin D) tablet 2,000 Units  2,000 Units Oral Daily Mordecai Maes, NP   2,000 Units at 05/24/20 9326    Lab Results: No results found for this or any previous visit (from the past 48 hour(s)).  Blood Alcohol level:  Lab Results  Component Value Date   ETH <10 05/16/2020   ETH <10 71/24/5809    Metabolic Disorder Labs: Lab Results  Component Value Date   HGBA1C 5.1 04/21/2020   MPG 99.67 04/21/2020   No results found for: PROLACTIN Lab Results  Component Value Date   CHOL 150 04/21/2020   TRIG 71 04/21/2020   HDL 47 04/21/2020   CHOLHDL 3.2 04/21/2020   VLDL 14 04/21/2020   LDLCALC 89 04/21/2020    Physical Findings: AIMS: Facial and Oral Movements Muscles of Facial Expression: None, normal Lips and Perioral Area: None, normal Jaw: None, normal Tongue: None, normal,Extremity Movements Upper (arms, wrists, hands, fingers): None, normal Lower (legs, knees, ankles, toes): None, normal, Trunk Movements Neck, shoulders, hips: None, normal, Overall Severity Severity of abnormal movements (highest score from questions above): None, normal Incapacitation due to abnormal movements: None, normal Patient's awareness of abnormal movements (rate only patient's report): No Awareness, Dental Status Current problems with teeth and/or dentures?: No Does patient  usually wear dentures?: No  CIWA:    COWS:     Musculoskeletal: Strength & Muscle Tone: within normal limits Gait & Station: normal Patient leans: N/A  Psychiatric Specialty Exam: Physical Exam Vitals and nursing note reviewed.  Constitutional:      Appearance: He is well-developed.  Cardiovascular:     Rate and Rhythm: Normal rate.  Pulmonary:     Effort: Pulmonary effort is normal.  Neurological:     Mental Status: He is alert and oriented to person, place, and time.     Review of Systems  Constitutional: Negative.   Respiratory: Negative for cough and shortness of breath.   Psychiatric/Behavioral: Positive for dysphoric mood and suicidal ideas. Negative for agitation, behavioral problems, confusion,  hallucinations, self-injury and sleep disturbance. The patient is not nervous/anxious and is not hyperactive.     Blood pressure 132/89, pulse 92, temperature 98 F (36.7 C), temperature source Oral, resp. rate 18, height _0  (1.803 m), weight 88 kg, SpO2 100 %.Body mass index is 27.06 kg/m.  General Appearance: Casual  Eye Contact:  Good  Speech:  Normal Rate  Volume:  Normal  Mood:  improving compared to admission  Affect:  improving, today smiled at times during session  Thought Process:  Linear and Descriptions of Associations: Intact  Orientation:  Full (Time, Place, and Person)  Thought Content:  denies hallucinations, no delusions expressed   Suicidal Thoughts: Currently denies suicidal plans or intentions and contracts for safety on unit, presents future oriented   Homicidal Thoughts:  No  Memory:  recent and remote grossly intact   Judgement:  Other:  improving   Insight:  Fair/ improving   Psychomotor Activity:  Normal- presents calm, without restlessness or agitation  Concentration:  Concentration: Good and Attention Span: Good  Recall:  Good  Fund of Knowledge:  Good  Language:  Good  Akathisia:  No  Handed:  Right  AIMS (if indicated):     Assets:   Communication Skills Desire for Improvement Financial Resources/Insurance Housing Social Support  ADL's:  Intact  Cognition:  WNL  Sleep:  Number of Hours: 6.5   Assessment -  Presents with gradually improving mood and range of affect . He is noted to be less guarded/less irritable. Currently presents future oriented and states he found out earlier today that he was accepted to [facility], with bed available on Monday. He states he would like to spend 1-2 days at home with his parents prior to going to this program, but states his parents are against this, which he suspects is because they are concerned for his safety, based on his recent suicidal attempt. At this time denies SI and presents future oriented . Tolerating medications well , and appears to be responding well to Vraylar/WellbutrinXL combination thus far .  Treatment Plan Summary: Daily contact with patient to assess and evaluate symptoms and progress in treatment and Medication management  Treatment plan review as below today 8/4 Continue Wellbutrin XL to 300 mg PO daily for depression Continue Vraylar 3 mg PO daily for mood Continue Lamictal 200 mg PO QHS for mood Continue Lovaza 1 g PO BID for neuroprotection Continue vitamin D3 2000 units PO daily for supplementation Continue vitamin C 2000 mg PO daily for supplementation Treatment team is working on disposition planning options-see above.  Jenne Campus, MD 05/24/2020, 4:55 PM   Patient ID: Deforest Hoyles., male   DOB: 24-Oct-1985, 34 y.o.   MRN: [id number]

## 2020-05-25 MED ORDER — LORATADINE 10 MG PO TABS
10.0000 mg | ORAL_TABLET | Freq: Every day | ORAL | Status: DC
  Administered 2020-05-25 – 2020-05-29 (×5): 10 mg via ORAL
  Filled 2020-05-25 (×3): qty 1
  Filled 2020-05-25: qty 7
  Filled 2020-05-25 (×3): qty 1
  Filled 2020-05-25: qty 7

## 2020-05-25 MED ORDER — LORATADINE 10 MG PO TABS: 10.0000 mg | ORAL_TABLET | Freq: Every day | ORAL | Status: DC

## 2020-05-25 MED FILL — buPROPion HCL ER (XL) 300 M: 300 | 30 days supply | Qty: 30 | Fill #0

## 2020-05-25 NOTE — Progress Notes (Signed)
°   05/24/20 2100  Psych Admission Type (Psych Patients Only)  Admission Status Involuntary  Psychosocial Assessment  Patient Complaints Anxiety  Eye Contact Fair  Facial Expression Flat;Pensive  Affect Preoccupied  Speech Logical/coherent  Interaction Cautious;Forwards little;Guarded;Manipulative;Minimal  Motor Activity Slow  Appearance/Hygiene Unremarkable  Behavior Characteristics Cooperative;Appropriate to situation  Mood Anxious;Preoccupied  Thought Process  Coherency Concrete thinking  Content Ambivalence;Blaming others  Delusions Persecutory  Perception WDL  Hallucination None reported or observed  Judgment Poor  Confusion None  Danger to Self  Current suicidal ideation? Passive  Self-Injurious Behavior Some self-injurious ideation observed or expressed.  No lethal plan expressed   Agreement Not to Harm Self Yes  Description of Agreement verbally contracts for safety  Danger to Others  Danger to Others None reported or observed

## 2020-05-25 NOTE — Progress Notes (Signed)
   05/25/20 2211  Psych Admission Type (Psych Patients Only)  Admission Status Involuntary  Psychosocial Assessment  Patient Complaints Anxiety  Eye Contact Fair  Facial Expression Flat;Pensive  Affect Preoccupied  Speech Logical/coherent  Interaction Cautious;Forwards little;Guarded;Manipulative;Minimal  Motor Activity Slow  Appearance/Hygiene Unremarkable  Behavior Characteristics Cooperative  Mood Anxious  Thought Process  Coherency Concrete thinking  Content Ambivalence;Blaming others  Delusions Persecutory  Perception WDL  Hallucination None reported or observed  Judgment Poor  Confusion None  Danger to Self  Current suicidal ideation? Passive  Self-Injurious Behavior Some self-injurious ideation observed or expressed.  No lethal plan expressed   Agreement Not to Harm Self Yes  Description of Agreement verbally contracts for safety  Danger to Others  Danger to Others None reported or observed  D: Patient in dayroom reports he had a good day.  A: Medications administered as prescribed. Support and encouragement provided as needed.  R: Patient remains safe on the unit. Will continue to monitor for safety and stability.

## 2020-05-25 NOTE — Progress Notes (Addendum)
   05/25/20 0628  Vital Signs  Temp 97.8 F (36.6 C)  Temp Source Oral  Pulse Rate 94  Resp 18  BP (!) 132/95  BP Location Right Arm  BP Method Automatic  Patient Position (if appropriate) Standing  Oxygen Therapy  SpO2 96 %   D:  Patient admits to some passive SI. Denies HI and AVH. Patient rated depression and anxiety both7/10.  A:  Patient took scheduled medicine except a multivitamin.  Support and encouragement provided Routine safety checks conducted every 15 minutes. Patient  Informed to notify staff with any concerns.  R: Safety maintained

## 2020-05-25 NOTE — Progress Notes (Signed)
Adult Psychoeducational Group Note  Date:  05/25/2020 Time:  9:26 PM  Group Topic/Focus:  Wrap-Up Group:   The focus of this group is to help patients review their daily goal of treatment and discuss progress on daily workbooks.  Participation Level:  Active  Participation Quality:  Appropriate  Affect:  Appropriate    Cognitive:  Appropriate  Insight: Appropriate  Engagement in Group:  Engaged  Modes of Intervention:  Discussion  Additional Comments:   Chauncey Fischer 05/25/2020, 9:26 PM

## 2020-05-25 NOTE — Progress Notes (Signed)
[facility] MD Progress Note  05/25/2020 2:17 PM Adam Coralee Rud.  MRN:  [id number] Subjective: Endorses partial improvement since admission.  Denies medication side effects at this time.  Currently focusing on discharge planning.  Objective: I have reviewed case with treatment team and I met with patient.  34 year old male, lives with parents, known to staff from recent admission to [facility] earlier this month. Presented for suicide attempt by overdose (on Vraylar) on 7/27. He reports overdose as impulsive/unplanned but does acknowledge depression and intermittent suicidal ideations leading up to event. He reports neurovegetative symptoms of depression. Currently denies psychotic symptoms, but presents vaguely guarded/suspicious on approach. He endorses living with parents/unemployment/limited daily structuredactivities/purpose as major stressor.  Today presents alert, attentive, calm, polite on approach.  Remains future oriented, and focusing on disposition planning which is currently to go to a residential treatment facility following discharge from [facility]) .  He had hoped to spend the weekend at home with his parents in anticipation for admission to above facility on Monday but states his parents are concerned and prefer for him to go straight from [facility] to [facility].  Today does not appear overly concerned about this and states he understands his family's reluctance for him to return home, based on recent history of worsening depression and suicidal attempt. With his expressed consent I spoke with his mother and father via phone.  They are very involved in his care.  They confirm disposition plan as above.  They report that patient needs to have prescribed medications with him on admission to [facility] and are hoping to be able to pick prescriptions up on Monday AM before transporting him there .  They are concerned that Vraylar may not be available at the pharmacy if it does not call them  earlier.  *I have reviewed this with pharmacist who will contact patient's pharmacy to verify/confirm that this medication is available on patient's discharge date. Parents also inquired about patient's diagnoses, which we reviewed .  Currently working diagnoses MDD.  (He endorses history of depressive episodes.  There is also a history of a paranoid ideations, which he reports worsen during episodes of depression.  Currently denies other psychotic symptoms or hallucinations.  He does not endorse any clear history of mania although describes brief mood swings and episodes of increased irritability.) This time he is tolerating medications well. Currently denies suicidal ideations and presents future oriented. No disruptive or agitated behaviors on unit.  Polite on approach.   Principal Problem:  Depression Diagnosis: Active Problems:   MDD (major depressive disorder)  Total Time spent with patient: 20 minutes  Past Psychiatric History: See admission H&P  Past Medical History:  Past Medical History:  Diagnosis Date  . Anxiety   . Bipolar 1 disorder ([place])   . Depression   . Mania ([place])   . Suicidal ideations     Past Surgical History:  Procedure Laterality Date  . WISDOM TOOTH EXTRACTION     Family History:  Family History  Problem Relation Age of Onset  . Schizophrenia Paternal Grandmother    Family Psychiatric  History: See admission H&P Social History:  Social History   Substance and Sexual Activity  Alcohol Use Never     Social History   Substance and Sexual Activity  Drug Use Never    Social History   Socioeconomic History  . Marital status: Single    Spouse name: Not on file  . Number of children: 0  . Years  of education: 10 or 11  . Highest education level: 11th grade  Occupational History  . Not on file  Tobacco Use  . Smoking status: Current Every Day Smoker    Years: 10.00    Types: E-cigarettes  . Smokeless tobacco: Never Used  Vaping Use  . Vaping  Use: Every day  . Substances: Nicotine, Flavoring, Nicotine-salt  Substance and Sexual Activity  . Alcohol use: Never  . Drug use: Never  . Sexual activity: Not Currently  Other Topics Concern  . Not on file  Social History Narrative   Pt lives with his parents. States he has been diagnosed with ADHD, dyslexia, dysgraphia and learning disabilities. Finished 10 th or 11 th grade but is working on his GED through an online high school program. Pt is currently unemployed.   Social Determinants of Health   Financial Resource Strain:   . Difficulty of Paying Living Expenses:   Food Insecurity:   . Worried About Charity fundraiser in the Last Year:   . Arboriculturist in the Last Year:   Transportation Needs:   . Film/video editor (Medical):   Marland Kitchen Lack of Transportation (Non-Medical):   Physical Activity:   . Days of Exercise per Week:   . Minutes of Exercise per Session:   Stress:   . Feeling of Stress :   Social Connections:   . Frequency of Communication with Friends and Family:   . Frequency of Social Gatherings with Friends and Family:   . Attends Religious Services:   . Active Member of Clubs or Organizations:   . Attends Archivist Meetings:   Marland Kitchen Marital Status:    Additional Social History:   Sleep: Good  Appetite:  Good  Current Medications: Current Facility-Administered Medications  Medication Dose Route Frequency Provider Last Rate Last Admin  . buPROPion (WELLBUTRIN XL) 24 hr tablet 300 mg  300 mg Oral Daily Connye Burkitt, NP   300 mg at 05/25/20 0750  . cariprazine (VRAYLAR) capsule 3 mg  3 mg Oral Daily Onuoha, Josephine C, NP   3 mg at 05/25/20 0750  . lamoTRIgine (LAMICTAL) tablet 200 mg  200 mg Oral QHS Mordecai Maes, NP   200 mg at 05/24/20 2102  . loratadine (CLARITIN) tablet 10 mg  10 mg Oral Daily Jazzmen Restivo, Myer Peer, MD      . multivitamin with minerals tablet 1 tablet  1 tablet Oral Daily Mordecai Maes, NP      . nicotine (NICODERM CQ  - dosed in mg/24 hours) patch 21 mg  21 mg Transdermal Daily Shanena Pellegrino, Myer Peer, MD   21 mg at 05/25/20 0749  . omega-3 acid ethyl esters (LOVAZA) capsule 1 g  1 g Oral BID Mordecai Maes, NP   1 g at 05/25/20 0750  . Vitamin D3 (Vitamin D) tablet 2,000 Units  2,000 Units Oral Daily Mordecai Maes, NP   2,000 Units at 05/25/20 0750    Lab Results: No results found for this or any previous visit (from the past 48 hour(s)).  Blood Alcohol level:  Lab Results  Component Value Date   ETH <10 05/16/2020   ETH <10 41/66/0630    Metabolic Disorder Labs: Lab Results  Component Value Date   HGBA1C 5.1 04/21/2020   MPG 99.67 04/21/2020   No results found for: PROLACTIN Lab Results  Component Value Date   CHOL 150 04/21/2020   TRIG 71 04/21/2020   HDL 47 04/21/2020   CHOLHDL  3.2 04/21/2020   VLDL 14 04/21/2020   LDLCALC 89 04/21/2020    Physical Findings: AIMS: Facial and Oral Movements Muscles of Facial Expression: None, normal Lips and Perioral Area: None, normal Jaw: None, normal Tongue: None, normal,Extremity Movements Upper (arms, wrists, hands, fingers): None, normal Lower (legs, knees, ankles, toes): None, normal, Trunk Movements Neck, shoulders, hips: None, normal, Overall Severity Severity of abnormal movements (highest score from questions above): None, normal Incapacitation due to abnormal movements: None, normal Patient's awareness of abnormal movements (rate only patient's report): No Awareness, Dental Status Current problems with teeth and/or dentures?: No Does patient usually wear dentures?: No  CIWA:    COWS:     Musculoskeletal: Strength & Muscle Tone: within normal limits Gait & Station: normal Patient leans: N/A  Psychiatric Specialty Exam: Physical Exam Vitals and nursing note reviewed.  Constitutional:      Appearance: He is well-developed.  Cardiovascular:     Rate and Rhythm: Normal rate.  Pulmonary:     Effort: Pulmonary effort is normal.   Neurological:     Mental Status: He is alert and oriented to person, place, and time.     Review of Systems  Constitutional: Negative.   Respiratory: Negative for cough and shortness of breath.   Psychiatric/Behavioral: Positive for dysphoric mood and suicidal ideas. Negative for agitation, behavioral problems, confusion, hallucinations, self-injury and sleep disturbance. The patient is not nervous/anxious and is not hyperactive.   Denies chest pain, no shortness of breath, no vomiting  Blood pressure (!) 132/95, pulse 94, temperature 97.8 F (36.6 C), temperature source Oral, resp. rate 18, height 5' 11"  (1.803 m), weight 88 kg, SpO2 96 %.Body mass index is 27.06 kg/m.  General Appearance: Casual  Eye Contact:  Good  Speech:  Normal Rate  Volume:  Normal  Mood:  Gradual improvement compared to admission  Affect:  Less blunted, becoming more reactive  Thought Process:  Linear and Descriptions of Associations: Intact  Orientation:  Full (Time, Place, and Person)  Thought Content:  denies hallucinations, no delusions expressed   Suicidal Thoughts: Today denies suicidal ideations or self-injurious ideations.  Contracts for safety on unit.  Presents future oriented.  As per nursing staff has endorsed passive SI.  Homicidal Thoughts:  No  Memory:  recent and remote grossly intact   Judgement:  Other:  improving   Insight:  Fair/ improving   Psychomotor Activity:  Normal- presents calm, without restlessness or agitation  Concentration:  Concentration: Good and Attention Span: Good  Recall:  Good  Fund of Knowledge:  Good  Language:  Good  Akathisia:  No  Handed:  Right  AIMS (if indicated):     Assets:  Communication Skills Desire for Improvement Financial Resources/Insurance Housing Social Support  ADL's:  Intact  Cognition:  WNL  Sleep:  Number of Hours: 6.5   Assessment - 34 year old male, lives with parents, known to staff from recent admission to [facility] earlier this month.  Presented for suicide attempt by overdose (on Vraylar) on 7/27. He reports overdose as impulsive/unplanned but does acknowledge depression and intermittent suicidal ideations leading up to event. He reports neurovegetative symptoms of depression. Currently denies psychotic symptoms, but presents vaguely guarded/suspicious on approach. He endorses living with parents/unemployment/limited daily structuredactivities/purpose as major stressor.  Currently patient is presenting with gradually improving mood affect has tended to be less irritable and more reactive.  Although vaguely guarded he is calm/cooperative/polite on approach and presents less guarded/less paranoid than on his prior psychiatric admission to  [facility].  Currently tolerating medications well, denies side effects.  He is future oriented and focusing on disposition plan, which is to go to a residential treatment setting [facility]), with a bed available for him on Monday. Parents are involved in his care and supportive, plan to transport him to [facility] at discharge   Treatment Plan Summary: Daily contact with patient to assess and evaluate symptoms and progress in treatment and Medication management  Treatment plan review as below today 8/5 Continue Wellbutrin XL to 300 mg PO daily for depression Continue Vraylar 3 mg PO daily for mood Continue Lamictal 200 mg PO QHS for mood Continue Lovaza 1 g PO BID for neuroprotection Continue vitamin D3 2000 units PO daily for supplementation Continue vitamin C 2000 mg PO daily for supplementation Treatment team is working on disposition planning options-see above.  Jenne Campus, MD 05/25/2020, 2:17 PM   Patient ID: Adam Hoyles., male   DOB: 1986-06-19, 34 y.o.   MRN: [id number]

## 2020-05-25 NOTE — Progress Notes (Signed)
Pt attend wrap up group. His day was a 2. Pt said he had a bad day. His goal was to make it thru the day and try to have better thoughts.

## 2020-05-25 NOTE — Progress Notes (Signed)
Adult Psychoeducational Group Note  Date:  05/25/2020 Time:  10:52 AM  Group Topic/Focus:  Goals Group:   The focus of this group is to help patients establish daily goals to achieve during treatment and discuss how the patient can incorporate goal setting into their daily lives to aide in recovery.  Participation Level:  Active  Participation Quality:  Appropriate  Affect:  Appropriate  Cognitive:  Alert and Appropriate  Insight: Appropriate  Engagement in Group:  Engaged  Modes of Intervention:  Discussion  Additional Comments:  Pt attended group and participated in discussion.  Alaynna Kerwood R Markeese Boyajian 05/25/2020, 10:52 AM

## 2020-05-26 LAB — SARS CORONAVIRUS 2 BY RT PCR (HOSPITAL ORDER, PERFORMED IN ~~LOC~~ HOSPITAL LAB): SARS Coronavirus 2: NEGATIVE

## 2020-05-26 NOTE — Progress Notes (Signed)
Recreation Therapy Notes  Date: 8.6.21 Time: 0930 Location: [address] Dayroom  Group Topic: Stress Management  Goal Area(s) Addresses:  Patient will identify positive stress management techniques. Patient will identify benefits of using stress management post d/c.  Behavioral Response: Engaged  Intervention: Stress Management  Activity: Guided Imagery.  LRT read a script that guided patients to envision their peaceful place.  Patients were to listen and follow along as script was read to engage in activity.    Education:  Stress Management, Discharge Planning.   Education Outcome: Acknowledges Education  Clinical Observations/Feedback: Pt attended and participated in activity.    Rolena Knutson, LRT/CTRS         Genia Perin A 05/26/2020 11:59 AM 

## 2020-05-26 NOTE — Progress Notes (Signed)
Adult Psychoeducational Group Note  Date:  05/26/2020 Time:  4:36 AM  Group Topic/Focus:  Wrap-Up Group:   The focus of this group is to help patients review their daily goal of treatment and discuss progress on daily workbooks.  Participation Level:  Active  Participation Quality:  Appropriate  Affect:  Appropriate  Cognitive:  Appropriate  Insight: Appropriate  Engagement in Group:  Distracting and Engaged  Modes of Intervention:  Discussion  Additional Comments:  Pt attend wrap up group. His day was 5. His goal for today was to figure out what day he being discharged. Pt said he achieved his goal. His coping skills resting.  Charna Busman Long 05/26/2020, 4:36 AM

## 2020-05-26 NOTE — Progress Notes (Signed)
D-Pt endorses having suicidal thoughts.  Reports that he does not have a suicide plan in place, but, "I just feel down."   A-Medications administered, emotional support provided.  Covid test administered per MD order for pt's inpatient stay at [facility] in [place] on Monday 05/29/20. R-Pt keeps to himself and has been calm this shift.  Q 15 min safety checks in place.

## 2020-05-26 NOTE — Progress Notes (Signed)
   05/26/20 2000  Psych Admission Type (Psych Patients Only)  Admission Status Involuntary  Psychosocial Assessment  Patient Complaints Depression  Eye Contact Fair  Facial Expression Flat;Pensive  Affect Preoccupied  Speech Logical/coherent  Interaction Cautious;Forwards little;Guarded;Manipulative;Minimal  Motor Activity Slow  Appearance/Hygiene Unremarkable  Behavior Characteristics Cooperative  Mood Depressed  Thought Process  Coherency Concrete thinking  Content Ambivalence;Blaming others  Delusions Persecutory  Perception WDL  Hallucination None reported or observed  Judgment Poor  Confusion None  Danger to Self  Current suicidal ideation? Passive  Self-Injurious Behavior Some self-injurious ideation observed or expressed.  No lethal plan expressed   Agreement Not to Harm Self Yes  Description of Agreement verbally contracts for safety  Danger to Others  Danger to Others None reported or observed

## 2020-05-26 NOTE — Progress Notes (Signed)
[facility] Adam Larsen Progress Note  05/26/2020 1:32 PM Adam Coralee Rud.  MRN:  [id number] Subjective: Reports "I guess I am doing all right".  Currently denies suicidal ideations.  Tolerating medications well thus far, denies side effects.  Remains motivated/interested in going to a longer term residential treatment facility at discharge from [facility].   Objective: I have reviewed case with treatment team and I met with patient.  34 year old male, lives with parents, known to staff from recent admission to [facility] earlier this month. Presented for suicide attempt by overdose (on Vraylar) on 7/27. He reports overdose as impulsive/unplanned but does acknowledge depression and intermittent suicidal ideations leading up to event. He reports neurovegetative symptoms of depression. Currently denies psychotic symptoms, but presents vaguely guarded/suspicious on approach. He endorses living with parents/unemployment/limited daily structuredactivities/purpose as major stressor.  Presents alert, attentive, calm, polite on approach. Presents with a fuller range of affect compared to admission.  Also noted to be more communicative/ less guarded than on admission.  For example today spoke about a book he is currently reading and smiled  appropriately during session.  No disruptive or agitated behaviors.  He has been visible in dayroom, going to some groups. As noted, he is currently planning on going to [facility] - bed available on Monday AM.  Parents had expressed preference that he can go to this residential program directly from [facility], which he is agreeing to. At this time denies medication side effects.    Principal Problem:  Depression Diagnosis: Active Problems:   MDD (major depressive disorder)  Total Time spent with patient: 15 minutes  Past Psychiatric History: See admission H&P  Past Medical History:  Past Medical History:  Diagnosis Date  . Anxiety   . Bipolar 1 disorder ([place])   . Depression   . Mania  ([place])   . Suicidal ideations     Past Surgical History:  Procedure Laterality Date  . WISDOM TOOTH EXTRACTION     Family History:  Family History  Problem Relation Age of Onset  . Schizophrenia Paternal Grandmother    Family Psychiatric  History: See admission H&P Social History:  Social History   Substance and Sexual Activity  Alcohol Use Never     Social History   Substance and Sexual Activity  Drug Use Never    Social History   Socioeconomic History  . Marital status: Single    Spouse name: Not on file  . Number of children: 0  . Years of education: 10 or 83  . Highest education level: 11th grade  Occupational History  . Not on file  Tobacco Use  . Smoking status: Current Every Day Smoker    Years: 10.00    Types: E-cigarettes  . Smokeless tobacco: Never Used  Vaping Use  . Vaping Use: Every day  . Substances: Nicotine, Flavoring, Nicotine-salt  Substance and Sexual Activity  . Alcohol use: Never  . Drug use: Never  . Sexual activity: Not Currently  Other Topics Concern  . Not on file  Social History Narrative   Pt lives with his parents. States he has been diagnosed with ADHD, dyslexia, dysgraphia and learning disabilities. Finished 10 th or 11 th grade but is working on his GED through an online high school program. Pt is currently unemployed.   Social Determinants of Health   Financial Resource Strain:   . Difficulty of Paying Living Expenses:   Food Insecurity:   . Worried About Charity fundraiser in the Last Year:   .  Ran Out of Food in the Last Year:   Transportation Needs:   . Film/video editor (Medical):   Marland Kitchen Lack of Transportation (Non-Medical):   Physical Activity:   . Days of Exercise per Week:   . Minutes of Exercise per Session:   Stress:   . Feeling of Stress :   Social Connections:   . Frequency of Communication with Friends and Family:   . Frequency of Social Gatherings with Friends and Family:   . Attends Religious  Services:   . Active Member of Clubs or Organizations:   . Attends Archivist Meetings:   Marland Kitchen Marital Status:    Additional Social History:   Sleep: Good  Appetite:  Good  Current Medications: Current Facility-Administered Medications  Medication Dose Route Frequency Provider Last Rate Last Admin  . buPROPion (WELLBUTRIN XL) 24 hr tablet 300 mg  300 mg Oral Daily Adam Burkitt, Adam Larsen   300 mg at 05/26/20 2725  . cariprazine (VRAYLAR) capsule 3 mg  3 mg Oral Daily Charmaine Downs Larsen, Adam Larsen   3 mg at 05/26/20 3664  . lamoTRIgine (LAMICTAL) tablet 200 mg  200 mg Oral QHS Adam Maes, Adam Larsen   200 mg at 05/25/20 2108  . loratadine (CLARITIN) tablet 10 mg  10 mg Oral Daily Adam Larsen, Adam Peer, Adam Larsen   10 mg at 05/26/20 4034  . multivitamin with minerals tablet 1 tablet  1 tablet Oral Daily Adam Maes, Adam Larsen      . nicotine (NICODERM CQ - dosed in mg/24 hours) patch 21 mg  21 mg Transdermal Daily Adam Larsen, Adam Peer, Adam Larsen   21 mg at 05/26/20 0836  . omega-3 acid ethyl esters (LOVAZA) capsule 1 g  1 g Oral BID Adam Maes, Adam Larsen   1 g at 05/26/20 7425  . Vitamin D3 (Vitamin D) tablet 2,000 Units  2,000 Units Oral Daily Adam Maes, Adam Larsen   2,000 Units at 05/26/20 9563    Lab Results: No results found for this or any previous visit (from the past 48 hour(s)).  Blood Alcohol level:  Lab Results  Component Value Date   ETH <10 05/16/2020   ETH <10 87/56/4332    Metabolic Disorder Labs: Lab Results  Component Value Date   HGBA1C 5.1 04/21/2020   MPG 99.67 04/21/2020   No results found for: PROLACTIN Lab Results  Component Value Date   CHOL 150 04/21/2020   TRIG 71 04/21/2020   HDL 47 04/21/2020   CHOLHDL 3.2 04/21/2020   VLDL 14 04/21/2020   LDLCALC 89 04/21/2020    Physical Findings: AIMS: Facial and Oral Movements Muscles of Facial Expression: None, normal Lips and Perioral Area: None, normal Jaw: None, normal Tongue: None, normal,Extremity Movements Upper (arms,  wrists, hands, fingers): None, normal Lower (legs, knees, ankles, toes): None, normal, Trunk Movements Neck, shoulders, hips: None, normal, Overall Severity Severity of abnormal movements (highest score from questions above): None, normal Incapacitation due to abnormal movements: None, normal Patient's awareness of abnormal movements (rate only patient's report): No Awareness, Dental Status Current problems with teeth and/or dentures?: No Does patient usually wear dentures?: No  CIWA:    COWS:     Musculoskeletal: Strength & Muscle Tone: within normal limits Gait & Station: normal Patient leans: N/A  Psychiatric Specialty Exam: Physical Exam Vitals and nursing note reviewed.  Constitutional:      Appearance: He is well-developed.  Cardiovascular:     Rate and Rhythm: Normal rate.  Pulmonary:     Effort: Pulmonary  effort is normal.  Neurological:     Mental Status: He is alert and oriented to person, place, and time.     Review of Systems  Constitutional: Negative.   Respiratory: Negative for cough and shortness of breath.   Psychiatric/Behavioral: Positive for dysphoric mood and suicidal ideas. Negative for agitation, behavioral problems, confusion, hallucinations, self-injury and sleep disturbance. The patient is not nervous/anxious and is not hyperactive.   No chest pain, no shortness of breath, no vomiting  Blood pressure (!) 126/98, pulse 90, temperature 97.7 F (36.5 Larsen), temperature source Oral, resp. rate 18, height _0  (1.803 m), weight 88 kg, SpO2 96 %.Body mass index is 27.06 kg/m.  General Appearance: Casual  Eye Contact:  Good  Speech:  Normal Rate  Volume:  Normal  Mood:  Has gradually improved  Affect:  Affect also improving, becoming more reactive  Thought Process:  Linear and Descriptions of Associations: Intact  Orientation:  Full (Time, Place, and Person)  Thought Content:  denies hallucinations, no delusions expressed , Appears less guarded than on  admission  Suicidal Thoughts: At this time denies suicidal ideations.  Contracts for safety on unit.  Homicidal Thoughts:  No  Memory:  recent and remote grossly intact   Judgement:  Other:  improving   Insight:  improving   Psychomotor Activity:  Normal- presents calm, without restlessness or agitation  Concentration:  Concentration: Good and Attention Span: Good  Recall:  Good  Fund of Knowledge:  Good  Language:  Good  Akathisia:  No  Handed:  Right  AIMS (if indicated):     Assets:  Communication Skills Desire for Improvement Financial Resources/Insurance Housing Social Support  ADL's:  Intact  Cognition:  WNL  Sleep:  Number of Hours: 6.5   Assessment - 34 year old male, lives with parents, known to staff from recent admission to [facility] earlier this month. Presented for suicide attempt by overdose (on Vraylar) on 7/27. He reports overdose as impulsive/unplanned but does acknowledge depression and intermittent suicidal ideations leading up to event. He reports neurovegetative symptoms of depression. Currently denies psychotic symptoms, but presents vaguely guarded/suspicious on approach. He endorses living with parents/unemployment/limited daily structuredactivities/purpose as major stressor.  Patient continues to present with gradually/partially improving mood and range of affect.  He is noted to be less isolative/spending more time and dayroom/milieu, also less guarded.  Currently tolerating medications well, denies side effects (currently on Vraylar, Wellbutrin XL).  Remains motivated in going to [facility] residential setting at discharge, which is planned for Monday AM ( parents will pick him up) . Currently denies medication side effects and is tolerating Wellbutrin/Vraylar/Lamictal well thus far.    Treatment Plan Summary: Daily contact with patient to assess and evaluate symptoms and progress in treatment and Medication management  Treatment plan review as below today  8/6 Continue Wellbutrin XL to 300 mg PO daily for depression Continue Vraylar 3 mg PO daily for mood Continue Lamictal 200 mg PO QHS for mood Continue Lovaza 1 g PO BID for neuroprotection Continue vitamin D3 2000 units PO daily for supplementation Treatment team is working on disposition planning options-see above.  Adam Campus, Adam Larsen 05/26/2020, 1:32 PM   Patient ID: Adam Hoyles., male   DOB: Dec 01, 1985, 34 y.o.   MRN: [id number]

## 2020-05-27 NOTE — Progress Notes (Signed)
[place] NOVEL CORONAVIRUS (COVID-19) DAILY CHECK-OFF SYMPTOMS - answer yes or no to each - every day NO YES  Have you had a fever in the past 24 hours?  . Fever (Temp > 37.80C / 100F) X   Have you had any of these symptoms in the past 24 hours? . New Cough .  Sore Throat  .  Shortness of Breath .  Difficulty Breathing .  Unexplained Body Aches   X   Have you had any one of these symptoms in the past 24 hours not related to allergies?   . Runny Nose .  Nasal Congestion .  Sneezing   X   If you have had runny nose, nasal congestion, sneezing in the past 24 hours, has it worsened?  X   EXPOSURES - check yes or no X   Have you traveled outside the state in the past 14 days?  X   Have you been in contact with someone with a confirmed diagnosis of COVID-19 or PUI in the past 14 days without wearing appropriate PPE?  X   Have you been living in the same home as a person with confirmed diagnosis of COVID-19 or a PUI (household contact)?    X   Have you been diagnosed with COVID-19?    X              What to do next: Answered NO to all: Answered YES to anything:   Proceed with unit schedule Follow the [facility] Inpatient Flowsheet.   

## 2020-05-27 NOTE — Progress Notes (Signed)
[facility] MD Progress Note  05/27/2020 4:56 PM Dvaughn Coralee Rud.  MRN:  [id number] Subjective: patient reports partial improvement compared to admission , but endorses some persistent depression, describing mood as 4-5/10 . He does express motivation about going to [facility] Monday morning and feels that the structure and treatment offered there will help him continue to improve . Denies medication side effects.   Objective: I have reviewed case with treatment team and I met with patient.  34 year old male, lives with parents, known to staff from recent admission to [facility] earlier this month. Presented for suicide attempt by overdose (on Vraylar) on 7/27. He reports overdose as impulsive/unplanned but does acknowledge depression and intermittent suicidal ideations leading up to event. He reports neurovegetative symptoms of depression. Currently denies psychotic symptoms, but presents vaguely guarded/suspicious on approach. He endorses living with parents/unemployment/limited daily structuredactivities/purpose as major stressor.  Presents alert, attentive, calm. Behavior on unit in good control, visible in day room/milieu, limited interaction with peers As above, endorses improvement since admission but does report  some lingering /persistent depression . He denies suicidal ideations and is currently future oriented, looking forward to discharging [place] to [facility] inpatient residential setting . He denies suicidal ideations.  He denies medication side effects. Currently on Wellbutrin XL, Lamictal , Vraylar.  No akathisia noted or reported . Overall, he is presenting with a more reactive/less constricted affect than on admission.     Principal Problem:  Depression Diagnosis: Active Problems:   MDD (major depressive disorder)  Total Time spent with patient: 15 minutes  Past Psychiatric History: See admission H&P  Past Medical History:  Past Medical History:  Diagnosis Date  . Anxiety    . Bipolar 1 disorder ([place])   . Depression   . Mania ([place])   . Suicidal ideations     Past Surgical History:  Procedure Laterality Date  . WISDOM TOOTH EXTRACTION     Family History:  Family History  Problem Relation Age of Onset  . Schizophrenia Paternal Grandmother    Family Psychiatric  History: See admission H&P Social History:  Social History   Substance and Sexual Activity  Alcohol Use Never     Social History   Substance and Sexual Activity  Drug Use Never    Social History   Socioeconomic History  . Marital status: Single    Spouse name: Not on file  . Number of children: 0  . Years of education: 10 or 10  . Highest education level: 11th grade  Occupational History  . Not on file  Tobacco Use  . Smoking status: Current Every Day Smoker    Years: 10.00    Types: E-cigarettes  . Smokeless tobacco: Never Used  Vaping Use  . Vaping Use: Every day  . Substances: Nicotine, Flavoring, Nicotine-salt  Substance and Sexual Activity  . Alcohol use: Never  . Drug use: Never  . Sexual activity: Not Currently  Other Topics Concern  . Not on file  Social History Narrative   Pt lives with his parents. States he has been diagnosed with ADHD, dyslexia, dysgraphia and learning disabilities. Finished 10 th or 11 th grade but is working on his GED through an online high school program. Pt is currently unemployed.   Social Determinants of Health   Financial Resource Strain:   . Difficulty of Paying Living Expenses:   Food Insecurity:   . Worried About Charity fundraiser in the Last Year:   . Arboriculturist in  the Last Year:   Transportation Needs:   . Film/video editor (Medical):   Marland Kitchen Lack of Transportation (Non-Medical):   Physical Activity:   . Days of Exercise per Week:   . Minutes of Exercise per Session:   Stress:   . Feeling of Stress :   Social Connections:   . Frequency of Communication with Friends and Family:   . Frequency of Social Gatherings  with Friends and Family:   . Attends Religious Services:   . Active Member of Clubs or Organizations:   . Attends Archivist Meetings:   Marland Kitchen Marital Status:    Additional Social History:   Sleep: Good  Appetite:  Good  Current Medications: Current Facility-Administered Medications  Medication Dose Route Frequency Provider Last Rate Last Admin  . buPROPion (WELLBUTRIN XL) 24 hr tablet 300 mg  300 mg Oral Daily Connye Burkitt, NP   300 mg at 05/27/20 0849  . cariprazine (VRAYLAR) capsule 3 mg  3 mg Oral Daily Onuoha, Josephine C, NP   3 mg at 05/27/20 0850  . lamoTRIgine (LAMICTAL) tablet 200 mg  200 mg Oral QHS Mordecai Maes, NP   200 mg at 05/26/20 2147  . loratadine (CLARITIN) tablet 10 mg  10 mg Oral Daily Caya Soberanis, Myer Peer, MD   10 mg at 05/27/20 0850  . multivitamin with minerals tablet 1 tablet  1 tablet Oral Daily Mordecai Maes, NP      . nicotine (NICODERM CQ - dosed in mg/24 hours) patch 21 mg  21 mg Transdermal Daily Treyvon Blahut, Myer Peer, MD   21 mg at 05/27/20 0849  . omega-3 acid ethyl esters (LOVAZA) capsule 1 g  1 g Oral BID Mordecai Maes, NP   1 g at 05/27/20 0849  . Vitamin D3 (Vitamin D) tablet 2,000 Units  2,000 Units Oral Daily Mordecai Maes, NP   2,000 Units at 05/27/20 3086    Lab Results:  Results for orders placed or performed during the hospital encounter of 05/17/20 (from the past 48 hour(s))  SARS Coronavirus 2 by RT PCR (hospital order, performed in [facility] hospital lab) Nasopharyngeal Nasopharyngeal Swab     Status: None   Collection Time: 05/26/20  4:08 PM   Specimen: Nasopharyngeal Swab  Result Value Ref Range   SARS Coronavirus 2 NEGATIVE NEGATIVE    Comment: (NOTE) SARS-CoV-2 target nucleic acids are NOT DETECTED.  The SARS-CoV-2 RNA is generally detectable in upper and lower respiratory specimens during the acute phase of infection. The lowest concentration of SARS-CoV-2 viral copies this assay can detect is 250 copies / mL. A  negative result does not preclude SARS-CoV-2 infection and should not be used as the sole basis for treatment or other patient management decisions.  A negative result may occur with improper specimen collection / handling, submission of specimen other than nasopharyngeal swab, presence of viral mutation(s) within the areas targeted by this assay, and inadequate number of viral copies (<250 copies / mL). A negative result must be combined with clinical observations, patient history, and epidemiological information.  Fact Sheet for Patients:   StrictlyIdeas.no  Fact Sheet for Healthcare Providers: BankingDealers.co.za  This test is not yet approved or  cleared by the [place] FDA and has been authorized for detection and/or diagnosis of SARS-CoV-2 by FDA under an Emergency Use Authorization (EUA).  This EUA will remain in effect (meaning this test can be used) for the duration of the COVID-19 declaration under Section 564(b)(1) of the Act, 21  U.S.C. section 360bbb-3(b)(1), unless the authorization is terminated or revoked sooner.  Performed at [facility], [place] [address], [place], [place] [address]     Blood Alcohol level:  Lab Results  Component Value Date   ETH <10 05/16/2020   ETH <10 93/81/8299    Metabolic Disorder Labs: Lab Results  Component Value Date   HGBA1C 5.1 04/21/2020   MPG 99.67 04/21/2020   No results found for: PROLACTIN Lab Results  Component Value Date   CHOL 150 04/21/2020   TRIG 71 04/21/2020   HDL 47 04/21/2020   CHOLHDL 3.2 04/21/2020   VLDL 14 04/21/2020   LDLCALC 89 04/21/2020    Physical Findings: AIMS: Facial and Oral Movements Muscles of Facial Expression: None, normal Lips and Perioral Area: None, normal Jaw: None, normal Tongue: None, normal,Extremity Movements Upper (arms, wrists, hands, fingers): None, normal Lower (legs, knees, ankles, toes): None, normal,  Trunk Movements Neck, shoulders, hips: None, normal, Overall Severity Severity of abnormal movements (highest score from questions above): None, normal Incapacitation due to abnormal movements: None, normal Patient's awareness of abnormal movements (rate only patient's report): No Awareness, Dental Status Current problems with teeth and/or dentures?: No Does patient usually wear dentures?: No  CIWA:    COWS:     Musculoskeletal: Strength & Muscle Tone: within normal limits Gait & Station: normal Patient leans: N/A  Psychiatric Specialty Exam: Physical Exam Vitals and nursing note reviewed.  Constitutional:      Appearance: He is well-developed.  Cardiovascular:     Rate and Rhythm: Normal rate.  Pulmonary:     Effort: Pulmonary effort is normal.  Neurological:     Mental Status: He is alert and oriented to person, place, and time.     Review of Systems  Constitutional: Negative.   Respiratory: Negative for cough and shortness of breath.   Psychiatric/Behavioral: Positive for dysphoric mood and suicidal ideas. Negative for agitation, behavioral problems, confusion, hallucinations, self-injury and sleep disturbance. The patient is not nervous/anxious and is not hyperactive.   no chest pain, no shortness of breath, no vomiting   Blood pressure (!) 127/95, pulse 92, temperature (!) 97.5 F (36.4 C), temperature source Oral, resp. rate 20, height 5' 11"  (1.803 m), weight 88 kg, SpO2 100 %.Body mass index is 27.06 kg/m.  General Appearance: Casual  Eye Contact:  Good  Speech:  Normal Rate  Volume:  Normal  Mood:  reports partial improvement but still feeling depressed, characterizes mood as 4-5 /10  Affect:  more reactive affect   Thought Process:  Linear and Descriptions of Associations: Intact  Orientation:  Full (Time, Place, and Person)  Thought Content:  denies hallucinations, no delusions expressed , not internally preoccupied   Suicidal Thoughts: At this time denies  suicidal ideations.  Contracts for safety on unit.  Homicidal Thoughts:  No  Memory:  recent and remote grossly intact   Judgement:  Other:  improving   Insight:  improving   Psychomotor Activity:  Normal- presents calm, without restlessness or agitation  Concentration:  Concentration: Good and Attention Span: Good  Recall:  Good  Fund of Knowledge:  Good  Language:  Good  Akathisia:  No  Handed:  Right  AIMS (if indicated):     Assets:  Communication Skills Desire for Improvement Financial Resources/Insurance Housing Social Support  ADL's:  Intact  Cognition:  WNL  Sleep:  Number of Hours: 6   Assessment - 34 year old male, lives with parents, known to staff from recent  admission to [facility] earlier this month. Presented for suicide attempt by overdose (on Vraylar) on 7/27. He reports overdose as impulsive/unplanned but does acknowledge depression and intermittent suicidal ideations leading up to event. He reports neurovegetative symptoms of depression. Currently denies psychotic symptoms, but presents vaguely guarded/suspicious on approach. He endorses living with parents/unemployment/limited daily structuredactivities/purpose as major stressor.  Patient reports partial improvement but reports persistent depression. He presents  with gradually/partially improving  range of affect. Denies SI. He is noted to be less isolative/spending more time and dayroom/milieu, also less guarded.   Remains motivated in going to [place] residential setting at discharge, which is planned for Monday AM ( parents will pick him up) . Currently denies medication side effects and is tolerating Wellbutrin/Vraylar/Lamictal well thus far.    Treatment Plan Summary: Daily contact with patient to assess and evaluate symptoms and progress in treatment and Medication management  Treatment plan review as below today 8/7 Continue Wellbutrin XL to 300 mg PO daily for depression Continue Vraylar 3 mg PO daily  for mood Continue Lamictal 200 mg PO QHS for mood Continue Lovaza 1 g PO BID for neuroprotection Continue vitamin D3 2000 units PO daily for supplementation Treatment team is working on disposition planning options-see above.  Jenne Campus, MD 05/27/2020, 4:56 PM   Patient ID: Deforest Hoyles., male   DOB: [birth year], 34 y.o.   MRN: [id number]

## 2020-05-27 NOTE — Progress Notes (Signed)
   05/27/20 2158  Psych Admission Type (Psych Patients Only)  Admission Status Involuntary  Psychosocial Assessment  Patient Complaints None  Eye Contact Fair  Facial Expression Flat  Affect Appropriate to circumstance  Speech Logical/coherent  Interaction Minimal  Motor Activity Other (Comment) (WDL)  Appearance/Hygiene Unremarkable  Behavior Characteristics Appropriate to situation  Mood Depressed  Thought Process  Coherency Concrete thinking  Content WDL  Delusions None reported or observed  Perception WDL  Hallucination None reported or observed  Judgment Poor  Confusion None  Danger to Self  Current suicidal ideation? Denies  Self-Injurious Behavior No self-injurious ideation or behavior indicators observed or expressed   Agreement Not to Harm Self Yes  Description of Agreement verbally contracts for safety  Danger to Others  Danger to Others None reported or observed

## 2020-05-27 NOTE — Progress Notes (Signed)
Adult Psychoeducational Group Note  Date:  05/27/2020 Time:  12:25 PM  Group Topic/Focus:  Goals Group:   The focus of this group is to help patients establish daily goals to achieve during treatment and discuss how the patient can incorporate goal setting into their daily lives to aide in recovery.  Participation Level:  Active  Participation Quality:  Appropriate  Affect:  Appropriate  Cognitive:  Lacking  Insight: Lacking  Engagement in Group:  Lacking  Modes of Intervention:  Discussion  Additional Comments:  Patient attended Goals and Boundary setting group, but did not participate.  Blaise Grieshaber W Cathalina Barcia 05/27/2020, 12:25 PM

## 2020-05-27 NOTE — Progress Notes (Signed)
D. Pt presents with a flat affect/ depressed mood, brightens a little during interactions. Per pt's self inventory, pt rated his depression, hopelessness and anxiety a 6/6/5, respectively. Pt currently denies SI/HI and AVH A. Labs and vitals monitored. Pt administered scheduled medications. Pt supported emotionally and encouraged to express concerns and ask questions.   R. Pt remains safe with 15 minute checks. Will continue POC.

## 2020-05-27 NOTE — Progress Notes (Signed)
   05/27/20 2154  COVID-19 Daily Checkoff  Have you had a fever (temp > 37.80C/100F)  in the past 24 hours?  No  If you have had runny nose, nasal congestion, sneezing in the past 24 hours, has it worsened? No  COVID-19 EXPOSURE  Have you traveled outside the state in the past 14 days? No  Have you been in contact with someone with a confirmed diagnosis of COVID-19 or PUI in the past 14 days without wearing appropriate PPE? No  Have you been living in the same home as a person with confirmed diagnosis of COVID-19 or a PUI (household contact)? No  Have you been diagnosed with COVID-19? No

## 2020-05-28 DIAGNOSIS — F3132 Bipolar disorder, current episode depressed, moderate: Secondary | ICD-10-CM

## 2020-05-28 MED ORDER — BUPROPION HCL ER (XL) 300 MG PO TB24: 300.0000 mg | ORAL_TABLET | Freq: Every day | ORAL | 0 refills | Status: DC

## 2020-05-28 MED ORDER — LAMOTRIGINE 100 MG PO TABS
200.0000 mg | ORAL_TABLET | Freq: Every day | ORAL | Status: DC
  Filled 2020-05-28: qty 14

## 2020-05-28 MED ORDER — NICOTINE 21 MG/24HR TD PT24: 21.0000 mg | MEDICATED_PATCH | Freq: Every day | TRANSDERMAL | 0 refills | Status: AC

## 2020-05-28 MED ORDER — OMEGA-3-ACID ETHYL ESTERS 1 G PO CAPS: 1.0000 g | ORAL_CAPSULE | Freq: Two times a day (BID) | ORAL | 0 refills | Status: DC

## 2020-05-28 MED ORDER — LORATADINE 10 MG PO TABS: 10.0000 mg | ORAL_TABLET | Freq: Every day | ORAL | 0 refills | Status: DC

## 2020-05-28 NOTE — Progress Notes (Signed)
D:Adam Larsen  Presents flat, depressed mood. Interacts and maintains minimal eye during conversations. Denies any physical pain. Rates his depression as a 5 on 1-10 scale. Today he rates his hopelessness as a 5 on 1-10 scale as well as his anxiety.  Denies any suicidal ideation, homicidal ideations or auditory/visual hallucinations. Express his goal for today is remembering what he needs for his trip tomorrow to the long-term inpatient behavioral health facility. Denies SI, HI and AVH.   A: Medication education given verbalized understanding. Medication compliant with exception of MVI. Maintain unit level 3, fifteen minute safety checks. Encouraged to express concerns and ask questions.   R: Verbally contracts for safety. Encouraged to report is feelings change. Continue to remain safe.   Clio NOVEL CORONAVIRUS (COVID-19) DAILY CHECK-OFF SYMPTOMS - answer yes or no to each - every day NO YES  Have you had a fever in the past 24 hours?   Fever (Temp > 37.80C / 100F) X    Have you had any of these symptoms in the past 24 hours?  New Cough   Sore Throat    Shortness of Breath   Difficulty Breathing   Unexplained Body Aches   X    Have you had any one of these symptoms in the past 24 hours not related to allergies?    Runny Nose   Nasal Congestion   Sneezing   X    If you have had runny nose, nasal congestion, sneezing in the past 24 hours, has it worsened?   X    EXPOSURES - check yes or no X    Have you traveled outside the state in the past 14 days?   X    Have you been in contact with someone with a confirmed diagnosis of COVID-19 or PUI in the past 14 days without wearing appropriate PPE?   X    Have you been living in the same home as a person with confirmed diagnosis of COVID-19 or a PUI (household contact)?     X    Have you been diagnosed with COVID-19?     X                                                                                                                              What to do next: Answered NO to all: Answered YES to anything:    Proceed with unit schedule Follow the [facility] Inpatient Flowsheet.

## 2020-05-28 NOTE — Progress Notes (Signed)
Adult Psychoeducational Wrap-up Group Note     Group Topic/Focus:  Goals Group:   The focus of this group is to help patients review daily goals and to reflect on how goals were implemented throughout the day and the effect they had. In addition, the goal was to revise goals if needed and to discuss how the patient can incorporate goal setting into their daily lives to aide in recovery.   Participation Level:  Active   Participation Quality:  Appropriate   Affect:  Appropriate   Cognitive:  Appropriate   Insight: Appropriate   Engagement in Group:  Engaged   Modes of Intervention:  Discussion   Additional Comments:  Patient rated today a 5 and said that goal was achieved.  Was confused about what coping skills were so we did a verbal review.

## 2020-05-28 NOTE — Progress Notes (Signed)
   05/28/20 2144  Psych Admission Type (Psych Patients Only)  Admission Status Involuntary  Psychosocial Assessment  Patient Complaints None  Eye Contact Fair  Facial Expression Flat  Affect Appropriate to circumstance  Speech Logical/coherent  Interaction Minimal  Motor Activity Other (Comment) (WDL)  Appearance/Hygiene Unremarkable  Aggressive Behavior  Effect No apparent injury  Thought Process  Coherency Concrete thinking  Content WDL  Delusions None reported or observed  Perception WDL  Hallucination None reported or observed  Judgment Poor  Confusion None  Danger to Self  Current suicidal ideation? Denies  Self-Injurious Behavior No self-injurious ideation or behavior indicators observed or expressed   Agreement Not to Harm Self Yes  Description of Agreement verbally contracts for safety  Danger to Others  Danger to Others None reported or observed

## 2020-05-28 NOTE — [facility] Suicide Risk Assessment (Addendum)
[facility] Discharge Suicide Risk Assessment   Principal Problem: depression, S/P Suicidal Attempt by Overdose  Discharge Diagnoses: Active Problems:   MDD (major depressive disorder)   Total Time spent with patient: 30 minutes  Musculoskeletal: Strength & Muscle Tone: within normal limits Gait & Station: normal Patient leans: N/A  Psychiatric Specialty Exam: Review of Systems no headache, no chest pain, no shortness of breath, no cough, no vomiting , no rash   Blood pressure (!) 123/94, pulse 88, temperature 98.4 F (36.9 C), temperature source Oral, resp. rate 20, height 5\' 11"  (1.803 m), weight 88 kg, SpO2 100 %.Body mass index is 27.06 kg/m.  General Appearance: Casual  Eye Contact::  Fair, improves during session  Speech:  Normal Rate409  Volume:  Normal  Mood:  partially improved compared to admission, currently reports as 5/10 with 10 being best   Affect:  less blunted , has become more reactive since admission  Thought Process:  Linear and Descriptions of Associations: Intact  Orientation:  Full (Time, Place, and Person)  Thought Content:  no hallucinations, no delusions , not internally preoccupied   Suicidal Thoughts:  No denies any suicidal ideations, denies self injurious ideations, no homicidal or violent ideations   Homicidal Thoughts:  No  Memory:  recent and remote grossly intact   Judgement:  Other:  improving   Insight:  fair/improving   Psychomotor Activity:  Normal  Concentration:  Good  Recall:  Good  Fund of Knowledge:Good  Language: Good  Akathisia:  Negative  Handed:  Right  AIMS (if indicated):     Assets:  Communication Skills Resilience  Sleep:  Number of Hours: 4.5  Cognition: WNL  ADL's:  Intact   Mental Status Per Nursing Assessment::   On Admission:  Suicidal ideation indicated by patient  Demographic Factors:  34 year old male, lives with his parents, currently not employed  Loss Factors: Reports limited daily structure/ lack of  structured activities  as a stressor History of chronic mood disorder   Historical Factors: History of past psychiatric admission last month for suicidal ideations, depression, overdose on chemical products .   Risk Reduction Factors:   Positive social support and Positive coping skills or problem solving skills  Continued Clinical Symptoms:  Today presents alert, attentive, calm , acknowledges partially improved mood compared to admission. Affect less blunted . Thought process linear. Denies suicidal ideations, denies homicidal ideations, no hallucinations, no delusions expressed. Presents less guarded than on admission. Currently presents future oriented and expresses ongoing motivation in going to [facility]. With his express consent I spoke with his parents . They are involved in his care and in agreement with disposition planning, will be picking him up tomorrow in AM and take him to [facility].  No disruptive or agitated behaviors on unit, polite on approach. Denies medication side effects and has tolerated medications well thus far . Currently on Vraylar/ Wellbutrin XL/Lamictal. Side effects have been reviewed, including potential increased risk of seizures , possible metabolic/movement disorders associated with antipsychotic medications, and possible severe rash ( SJ syndrome) with lamotrigine   Cognitive Features That Contribute To Risk:  No gross cognitive deficits noted upon discharge. Is alert , attentive, and oriented x 3   Suicide Risk:  Mild:  Suicidal ideation of limited frequency, intensity, duration, and specificity.  There are no identifiable plans, no associated intent, mild dysphoria and related symptoms, good self-control (both objective and subjective assessment), few other risk factors, and identifiable protective factors, including available and  accessible social support.   Follow-up Information    [facility] Follow up.   Why: A referral has been made  on your behalf to this provider. Contact information: [address] [place], [place] [address]  P:  [phone] F:  [phone]              Plan Of Care/Follow-up recommendations:  Activity:  as tolerated  Diet:  regular Tests:  NA Other:  See below  Patient is being discharged tomorrow early in AM with plan to go to [facility] . His parents will pick him up and transport him there.   Craige Cotta, MD 05/28/2020, 4:16 PM

## 2020-05-28 NOTE — Discharge Summary (Signed)
Physician Discharge Summary Note  Patient:  Adam CanterburyRichard Mayson Jr. is an 34 y.o., male MRN:  [id number] DOB:  [birth year] Patient phone:  [phone] (home)  Patient address:   [address] [place] [id number],  Total Time spent with patient: 30 minutes  Date of Admission:  33/28/2021 Date of Discharge: 05/29/2020  Reason for Admission:34 year old male, lives with parents, known to staff from [facility].  Presented for suicide attempt by overdose (on Vraylar) on 7/27.  He reports overdose as impulsive/unplanned but does acknowledge depression and intermittent suicidal ideations leading up to event.  He reports neurovegetative symptoms of depression.  Currently denies psychotic symptoms, but presents vaguely guarded/suspicious on approach.  He endorses living with parents/unemployment/limited daily structured activities/purpose as major stressor. *Of note, states Vraylar had recently been prescribed by his outpatient psychiatrist but that he had not yet started taking this medication prior to the overdose.  Principal Problem: depression Discharge Diagnoses: Active Problems:   MDD (major depressive disorder)   Moderate bipolar I disorder, most recent episode depressed [facility](HCC)   Past Psychiatric History: History of prior psychiatric admission, as [facility].  He reports he has been diagnosed with bipolar disorder, and also reports a history of ADHD.  Currently denies history of psychosis although does acknowledge suspiciousness and vague paranoia.  He reports episodes of depression and he also states he has been "manic" in the past. He reports he has been taking his psychiatric medications, and has noted states that he was in the process of starting Vraylar, prescribed by his outpatient psychiatrist. He states he had not yet started taking this medication.   Past Medical History:  Past Medical History:  Diagnosis  Date  . Anxiety   . Bipolar 1 disorder (HCC)   . Depression   . Mania (HCC)   . Suicidal ideations     Past Surgical History:  Procedure Laterality Date  . WISDOM TOOTH EXTRACTION     Family History:  Family History  Problem Relation Age of Onset  . Schizophrenia Paternal Grandmother    Family Psychiatric  History: as above Social History:  Social History   Substance and Sexual Activity  Alcohol Use Never     Social History   Substance and Sexual Activity  Drug Use Never    Social History   Socioeconomic History  . Marital status: Single    Spouse name: Not on file  . Number of children: 0  . Years of education: 10 or 6111  . Highest education level: 11th grade  Occupational History  . Not on file  Tobacco Use  . Smoking status: Current Every Day Smoker    Years: 10.00    Types: E-cigarettes  . Smokeless tobacco: Never Used  Vaping Use  . Vaping Use: Every day  . Substances: Nicotine, Flavoring, Nicotine-salt  Substance and Sexual Activity  . Alcohol use: Never  . Drug use: Never  . Sexual activity: Not Currently  Other Topics Concern  . Not on file  Social History Narrative   Pt lives with his parents. States he has been diagnosed with ADHD, dyslexia, dysgraphia and learning disabilities. Finished 10 th or 11 th grade but is working on his GED through an online high school program. Pt is currently unemployed.   Social Determinants of Health   Financial Resource Strain:   . Difficulty of Paying Living Expenses:   Food Insecurity:   . Worried About Running  Out of Food in the Last Year:   . Ran Out of Food in the Last Year:   Transportation Needs:   . Lack of Transportation (Medical):   Marland Kitchen Lack of Transportation (Non-Medical):   Physical Activity:   . Days of Exercise per Week:   . Minutes of Exercise per Session:   Stress:   . Feeling of Stress :   Social Connections:   . Frequency of Communication with Friends and Family:   . Frequency of Social  Gatherings with Friends and Family:   . Attends Religious Services:   . Active Member of Clubs or Organizations:   . Attends Banker Meetings:   Marland Kitchen Marital Status:     Hospital Course: Patient was actively engaged in medication management decision making . He expressed interest in Vraylar trial ( he had just recently been prescribed this medication and had not yet started taking it prior to his overdose.)  He was also recently started on Wellbutrin XL, which replaced Strattera ( which he had been taking for ADHD symptoms) . Patient was started on Vraylar , which was titrated to 33 mgrs QDAY. He was continued on Wellubtrin XL, which was titrated to 333 mgrs QDAY . He was continued on Lamictal 333 mgrs QDAY . He tolerated medications well. During his admission he presented with gradual improvement overall . Mood and range of affect both tended to improve . He gradually  presented more communicative and less guarded during his admission.  He expressed interest in going to a longer term inpatient treatment facility and was accepted to [facility] following a phone based interview. His behavior remained in good control, without episodes of agitation or restlessness.  His parents are involved in his care and with patient's express consent writer communicated with them several times during his inpatient stay.  On the day of discharge patient presents alert, attentive, calm , polite on approach,  with improving mood compared to admission. Affect is less blunted/ more reactive, and not irritable . Thought process linear. Denies suicidal ideations, denies homicidal ideations, no hallucinations, no delusions expressed. Currently presents future oriented and expresses ongoing motivation in going to [facility].With his express consent I spoke with his parents to review discharge planning and procedure  . They are in agreement with disposition planning and will be picking him up tomorrow in AM  and take him to [facility].  Denies medication side effects and has tolerated medications well thus far . Currently on Vraylar/ Wellbutrin XL/Lamictal. Side effects have been reviewed, including potential increased risk of seizures , possible metabolic/movement disorders associated with antipsychotic medications, and possible severe rash ( SJ syndrome) with lamotrigine  Physical Findings: AIMS: Facial and Oral Movements Muscles of Facial Expression: None, normal Lips and Perioral Area: None, normal Jaw: None, normal Tongue: None, normal,Extremity Movements Upper (arms, wrists, hands, fingers): None, normal Lower (legs, knees, ankles, toes): None, normal, Trunk Movements Neck, shoulders, hips: None, normal, Overall Severity Severity of abnormal movements (highest score from questions above): None, normal Incapacitation due to abnormal movements: None, normal Patient's awareness of abnormal movements (rate only patient's report): No Awareness, Dental Status Current problems with teeth and/or dentures?: No Does patient usually wear dentures?: No  CIWA:    COWS:     Musculoskeletal: Strength & Muscle Tone: within normal limits Gait & Station: normal Patient leans: N/A  Psychiatric Specialty Exam: Physical Exam  Review of Systems  Blood pressure (!) 123/94, pulse 88, temperature 98.4 F (36.9  C), temperature source Oral, resp. rate 20, height 5\' 11"  (1.803 m), weight 88 kg, SpO2 100 %.Body mass index is 27.06 kg/m.  General Appearance: Casual  Eye Contact:  Fair and improving  Speech:  Normal Rate  Volume:  Normal  Mood:  partially improved compared to admission, currently reports as 5/10 with 10 being best   Affect:   less blunted , has become more reactive since admission  Thought Process:  Linear and Descriptions of Associations: Intact  Orientation:  Full (Time, Place, and Person)  Thought Content:  NA  Suicidal Thoughts:  No  Homicidal Thoughts:  No  Memory:  Immediate;    Good Recent;   Good  Judgement:  Other:  Improving  Insight:  Fair and Improving  Psychomotor Activity:  Normal  Concentration:  Concentration: Good and Attention Span: Good  Recall:  Good  Fund of Knowledge:  Good  Language:  Good  Akathisia:  Negative  Handed:  Right  AIMS (if indicated):     Assets:  Communication Skills Desire for Improvement Social Support  ADL's:  Intact  Cognition:  WNL  Sleep:  Number of Hours: 4.5        Has this patient used any form of tobacco in the last 30 days? (Cigarettes, Smokeless Tobacco, Cigars, and/or Pipes) Yes   Blood Alcohol level:  Lab Results  Component Value Date   ETH <10 05/16/2020   ETH <10 04/19/2020    Metabolic Disorder Labs:  Lab Results  Component Value Date   HGBA1C 5.1 04/21/2020   MPG 99.67 04/21/2020   No results found for: PROLACTIN Lab Results  Component Value Date   CHOL 150 04/21/2020   TRIG 71 04/21/2020   HDL 47 04/21/2020   CHOLHDL 3.2 04/21/2020   VLDL 14 04/21/2020   LDLCALC 89 04/21/2020    See Psychiatric Specialty Exam and Suicide Risk Assessment completed by Attending Physician prior to discharge.  Discharge destination:  Other:  06/22/2020 [facility]  Is patient on multiple antipsychotic therapies at discharge:  No   Has Patient had three or more failed trials of antipsychotic monotherapy by history:  No  Recommended Plan for Multiple Antipsychotic Therapies: NA  Discharge Instructions    Discharge patient   Complete by: As directed    Patient is scheduled for discharge tomorrow morning ( 05/29/2020) in order to go to 07/29/2020 residential/inpatient treatment facility.   Discharge disposition: 02-Transferred to [facility]   Discharge patient date: 05/29/2020     Allergies as of 05/28/2020   No Known Allergies     Medication List    STOP taking these medications   atomoxetine 18 MG capsule Commonly known as: STRATTERA   chlorproMAZINE 25 MG tablet Commonly known as: THORAZINE      TAKE these medications     Indication  buPROPion 300 MG 24 hr tablet Commonly known as: WELLBUTRIN XL Take 1 tablet (300 mg total) by mouth daily. Start taking on: May 29, 2020  Indication: Major Depressive Disorder   cariprazine capsule Commonly known as: Vraylar Take 1 capsule (3 mg total) by mouth daily.  Indication: Major Depressive Disorder   cholecalciferol 25 MCG (1000 UNIT) tablet Commonly known as: VITAMIN D3 Take 2,000 Units by mouth daily.  Indication: Vitamin D Deficiency   Fish Oil 1000 MG Caps Take 1,000 mg by mouth daily.  Indication: Disorder in Nutrition   lamoTRIgine 200 MG tablet Commonly known as: LAMICTAL Take 1 tablet (200 mg total) by mouth at  bedtime.  Indication: Manic-Depression   loratadine 10 MG tablet Commonly known as: CLARITIN Take 1 tablet (10 mg total) by mouth daily. Start taking on: May 29, 2020  Indication: Hayfever   multivitamin capsule Take 1 capsule by mouth daily.  Indication: Nutritional Support   nicotine 21 mg/24hr patch Commonly known as: NICODERM CQ - dosed in mg/24 hours Place 1 patch (21 mg total) onto the skin daily for 28 days. Start taking on: May 29, 2020  Indication: Nicotine Addiction   omega-3 acid ethyl esters 1 g capsule Commonly known as: LOVAZA Take 1 capsule (1 g total) by mouth 2 (two) times daily.  Indication: High Amount of Triglycerides in the Blood   vitamin C 1000 MG tablet Take 2,000 mg by mouth daily.  Indication: Inadequate Vitamin C       Follow-up Information    [facility] Follow up.   Why: A referral has been made on your behalf to this provider. Contact information: [address] [place], [place] [address]  P:  [phone] F:  [phone]              Follow-up recommendations:  Activity:  Normal Diet:  Normal  Comments:  Parents picking patient up on 8/9 early in AM to transport him to [facility]  Signed: Craige Cotta,  MD 05/28/2020, 4:58 PM

## 2020-05-29 NOTE — Progress Notes (Signed)
Recreation Therapy Notes  Date:  8.9.21 Time: 0930 Location: [address] Dayroom  Group Topic: Stress Management  Goal Area(s) Addresses:  Patient will identify positive stress management techniques. Patient will identify benefits of using stress management post d/c.  Intervention: Stress Management  Activity: Meditation.  LRT played a meditation that focused on having gratitude for people in your life who are supportive.  Patients were to listen and follow along as meditation played to engage in activity.   Education:  Stress Management, Discharge Planning.   Education Outcome: Acknowledges Education  Clinical Observations/Feedback: Pt did not attend group activity.    Caroll Rancher, LRT/CTRS         Caroll Rancher A 05/29/2020 10:52 AM

## 2020-05-29 NOTE — Progress Notes (Signed)
  [facility] Adult Case Management Discharge Plan :  Will you be returning to the same living situation after discharge:  No. Will be transferring to long term residential treatment. At discharge, do you have transportation home?: Yes,  parents to pick patient up. Do you have the ability to pay for your medications: Yes,  has insurance.  Release of information consent forms completed and in the chart;  Patient's signature needed at discharge.  Patient to Follow up at:  Follow-up Information    [facility] Follow up.   Why: A referral has been made on your behalf to this provider. Contact information: [address] [place], [place] [address]  P:  [phone] F:  [phone]              Next level of care provider has access to [facility] Link:no  Safety Planning and Suicide Prevention discussed: Yes,  with parents.     Has patient been referred to the Quitline?: Patient refused referral  Patient has been referred for addiction treatment: Yes  Otelia Santee, LCSW 05/29/2020, 9:16 AM

## 2020-05-29 NOTE — Progress Notes (Signed)
Discharge Note:  Patient discharged with parents who plan to take patient to [facility] [place], [place].  Patient stated he received all his belongings, clothing, toiletries, misc items, etc.  Patient stated he appreciated all assistance received from [facility] staff. Suicide prevention information given and discussed with patient who stated he understood and had no questions.  All required discharge information given to patient at discharge.

## 2020-05-29 NOTE — Progress Notes (Signed)
D:  Patient denied SI and HI.  Denied A/V hallucinations. A:  Patient refused multivitamins but took his other medications.  Emotional support and encouragement given patient. R:  Safety maintained with 15 minute checks.

## 2020-06-01 ENCOUNTER — Ambulatory Visit: Payer: 59 | Admitting: Adult Health

## 2020-06-29 ENCOUNTER — Ambulatory Visit: Payer: 59 | Admitting: Adult Health

## 2020-07-11 ENCOUNTER — Other Ambulatory Visit: Payer: Self-pay

## 2020-07-11 ENCOUNTER — Telehealth: Payer: Self-pay | Admitting: Adult Health

## 2020-07-11 MED ORDER — PERPHENAZINE 4 MG PO TABS: ORAL_TABLET | ORAL | 0 refills | Status: DC

## 2020-07-11 MED ORDER — PERPHENAZINE 2 MG PO TABS: ORAL_TABLET | ORAL | 0 refills | Status: DC

## 2020-07-11 NOTE — Telephone Encounter (Signed)
Ok to send enough in until appointment - bring medication bottles.

## 2020-07-11 NOTE — Telephone Encounter (Signed)
Mother, Adam Larsen, called to request refill of perphenazine which was prescribed to him when he was at [facility].  He will be out today and won't have any anti-psychotic meds if this is not refilled.  He appt Thursday and can go over all his medications then, but doesn't want to wait until Thursday to get the perphenazine.  He takes 6mg  qAM and 6mg  qHS.  It is prescribed as 4mg  and 2mg  prescriptions.  Please send to [facility].

## 2020-07-11 NOTE — Telephone Encounter (Signed)
Please review and send if appropriate

## 2020-07-11 NOTE — Telephone Encounter (Signed)
#   6 of each strength sent to [facility].   4 mg #6 2 mg #6 total of 6 mg bid

## 2020-07-11 NOTE — Telephone Encounter (Signed)
Can not verify his dose at the pharmacy they have never filled it before. Probably received at hospital?

## 2020-07-11 NOTE — Telephone Encounter (Signed)
Can we check with the pharmacy to verify dosage?

## 2020-07-11 NOTE — Telephone Encounter (Signed)
Adam Larsen, I will verify his dose

## 2020-07-13 ENCOUNTER — Encounter: Payer: Self-pay | Admitting: Adult Health

## 2020-07-13 ENCOUNTER — Encounter (INDEPENDENT_AMBULATORY_CARE_PROVIDER_SITE_OTHER): Payer: Self-pay

## 2020-07-13 ENCOUNTER — Other Ambulatory Visit: Payer: Self-pay

## 2020-07-13 ENCOUNTER — Ambulatory Visit (INDEPENDENT_AMBULATORY_CARE_PROVIDER_SITE_OTHER): Payer: 59 | Admitting: Adult Health

## 2020-07-13 DIAGNOSIS — F900 Attention-deficit hyperactivity disorder, predominantly inattentive type: Secondary | ICD-10-CM | POA: Diagnosis not present

## 2020-07-13 DIAGNOSIS — F411 Generalized anxiety disorder: Secondary | ICD-10-CM | POA: Diagnosis not present

## 2020-07-13 DIAGNOSIS — G4700 Insomnia, unspecified: Secondary | ICD-10-CM | POA: Diagnosis not present

## 2020-07-13 DIAGNOSIS — F319 Bipolar disorder, unspecified: Secondary | ICD-10-CM | POA: Diagnosis not present

## 2020-07-13 DIAGNOSIS — F331 Major depressive disorder, recurrent, moderate: Secondary | ICD-10-CM

## 2020-07-13 MED ORDER — LAMOTRIGINE 200 MG PO TABS: 200.0000 mg | ORAL_TABLET | Freq: Every day | ORAL | 2 refills | Status: DC

## 2020-07-13 MED ORDER — PERPHENAZINE 4 MG PO TABS: ORAL_TABLET | ORAL | 0 refills | Status: DC

## 2020-07-13 MED ORDER — PERPHENAZINE 2 MG PO TABS: ORAL_TABLET | ORAL | 0 refills | Status: DC

## 2020-07-13 MED ORDER — BUPROPION HCL ER (XL) 300 MG PO TB24: 300.0000 mg | ORAL_TABLET | Freq: Every day | ORAL | 2 refills | Status: DC

## 2020-07-13 NOTE — Progress Notes (Signed)
Adam Larsen [id number] [birth year] 34 y.o.  Subjective:   Patient ID:  Adam Larsen. is a 34 y.o. (DOB [birth year]) male.  Chief Complaint: No chief complaint on file.   HPI Adam Larsen. presents to the office today for follow-up of MDD, GAD, insomnia, BPD1, and ADHD.  Describes mood today as "ok". Pleasant. Mood symptoms - decreased depression (1 to 2 on a scale of 1 to 10), denies anxiety and irritability. Stating "I feel pretty neutral ". Feels satisfied with current medication regimen. Denies any paranoid or suicidal thoughts. Since last visit, overdosed on Vraylar and was taken to [facility] for stabilization. Discharged to [facility] - 4 weeks. Currently at [facility] IOP program for the next 3 weeks. Plans to get a job once finished with IOP. Improved interest and motivation. Taking medications as prescribed.  Energy levels "ok" - "kinda tired". Active, does not have a regular exercise routine - "I am more active". Doing things around the house. Kayaking. Stating "I' more active than I was previously".  Enjoys some usual interests and activities. Single. Lives with parents. Spending time with family. Getting out some. Plans to take driver's license test next week to be able to drive again.  Appetite adequate. Weight loss - 5 pounds - 194 pounds. Sleeps well most nights. Averages 5 to 6 hours. Napping during the day. Using Trazadone at night.  Focus and concentration "pretty good". Completing tasks. Managing aspects of household. Doing yard work. Denies SI or HI. Denies. Denies AH or VH.  History of seeing therapists.   Previous medication trials: Vraylar, Thorazine    AIMS     [facility]  AIMS Total Score 0 0    AUDIT     [facility]  Alcohol Use Disorder Identification Test Final Score (AUDIT) 1 0    PHQ2-9     ED from 05/16/2020 in [facility] EMERGENCY DEPARTMENT  PHQ-2 Total Score 6  PHQ-9 Total Score 17       Review of Systems:  Review of Systems  Musculoskeletal: Negative for gait problem.  Neurological: Negative for tremors.  Psychiatric/Behavioral:       Please refer to HPI    Medications: I have reviewed the patient's current medications.  Current Outpatient Medications  Medication Sig Dispense Refill  . Ascorbic Acid (VITAMIN C) 1000 MG tablet Take 2,000 mg by mouth daily.    Marland Kitchen buPROPion (WELLBUTRIN XL) 300 MG 24 hr tablet Take 1 tablet (300 mg total) by mouth daily. 30 tablet 2  . cholecalciferol (VITAMIN D3) 25 MCG (1000 UNIT) tablet Take 2,000 Units by mouth daily.    Marland Kitchen lamoTRIgine (LAMICTAL) 200 MG tablet Take 1 tablet (200 mg total) by mouth at bedtime. 30 tablet 2  . loratadine (CLARITIN) 10 MG tablet Take 1 tablet (10 mg total) by mouth daily. 30 tablet 0  . Multiple Vitamin (MULTIVITAMIN) capsule Take 1 capsule by mouth daily.    Marland Kitchen omega-3 acid ethyl esters (LOVAZA) 1 g capsule Take 1 capsule (1 g total) by mouth 2 (two) times daily. 60 capsule 0  . Omega-3 Fatty Acids (FISH OIL) 1000 MG CAPS Take 1,000 mg by mouth daily.    Marland Kitchen perphenazine (TRILAFON) 2 MG tablet Take  1 tablet (2 mg) by mouth with a 4 mg tablet twice a day (total of 6 mg bid) 6 tablet 0  . perphenazine (TRILAFON) 4 MG tablet Take 1 tablet (4 mg) by mouth with a 2 mg tablet twice a day. (6 mg bid) 6 tablet 0   No current facility-administered medications for this visit.    Medication Side Effects: None  Allergies: No Known Allergies  Past Medical History:  Diagnosis Date  . Anxiety   . Bipolar 1 disorder (HCC)   . Depression   . Mania (HCC)   . Suicidal ideations     Family  History  Problem Relation Age of Onset  . Schizophrenia Paternal Grandmother     Social History   Socioeconomic History  . Marital status: Single    Spouse name: Not on file  . Number of children: 0  . Years of education: 10 or 30  . Highest education level: 11th grade  Occupational History  . Not on file  Tobacco Use  . Smoking status: Current Every Day Smoker    Years: 10.00    Types: E-cigarettes  . Smokeless tobacco: Never Used  Vaping Use  . Vaping Use: Every day  . Substances: Nicotine, Flavoring, Nicotine-salt  Substance and Sexual Activity  . Alcohol use: Never  . Drug use: Never  . Sexual activity: Not Currently  Other Topics Concern  . Not on file  Social History Narrative   Pt lives with his parents. States he has been diagnosed with ADHD, dyslexia, dysgraphia and learning disabilities. Finished 10 th or 11 th grade but is working on his GED through an online high school program. Pt is currently unemployed.   Social Determinants of Health   Financial Resource Strain:   . Difficulty of Paying Living Expenses: Not on file  Food Insecurity:   . Worried About Programme researcher, broadcasting/film/video in the Last Year: Not on file  . Ran Out of Food in the Last Year: Not on file  Transportation Needs:   . Lack of Transportation (Medical): Not on file  . Lack of Transportation (Non-Medical): Not on file  Physical Activity:   . Days of Exercise per Week: Not on file  . Minutes of Exercise per Session: Not on file  Stress:   . Feeling of Stress : Not on file  Social Connections:   . Frequency of Communication with Friends and Family: Not on file  . Frequency of Social Gatherings with Friends and Family: Not on file  . Attends Religious Services: Not on file  . Active Member of Clubs or Organizations: Not on file  . Attends Banker Meetings: Not on file  . Marital Status: Not on file  Intimate Partner Violence:   . Fear of Current or Ex-Partner: Not on file  .  Emotionally Abused: Not on file  . Physically Abused: Not on file  . Sexually Abused: Not on file    Past Medical History, Surgical history, Social history, and Family history were reviewed and updated as appropriate.   Please see review of systems for further details on the patient's review from today.   Objective:   Physical Exam:  There were no vitals taken for this visit.  Physical Exam Constitutional:      General: He is not in acute distress. Musculoskeletal:        General: No deformity.  Neurological:     Mental Status: He is alert and oriented to person, place, and time.  Coordination: Coordination normal.  Psychiatric:        Attention and Perception: Attention and perception normal. He does not perceive auditory or visual hallucinations.        Mood and Affect: Mood normal. Mood is not anxious or depressed. Affect is not labile, blunt, angry or inappropriate.        Speech: Speech normal.        Behavior: Behavior normal.        Thought Content: Thought content normal. Thought content is not paranoid or delusional. Thought content does not include homicidal or suicidal ideation. Thought content does not include homicidal or suicidal plan.        Cognition and Memory: Cognition and memory normal.        Judgment: Judgment normal.     Comments: Insight intact     Lab Review:     Component Value Date/Time   NA 140 05/16/2020 0912   K 3.6 05/16/2020 0912   CL 105 05/16/2020 0912   CO2 24 05/16/2020 0912   GLUCOSE 202 (H) 05/16/2020 0912   BUN 11 05/16/2020 0912   CREATININE 1.05 05/16/2020 0912   CALCIUM 9.6 05/16/2020 0912   PROT 7.5 05/16/2020 0912   ALBUMIN 4.3 05/16/2020 0912   AST 20 05/16/2020 0912   ALT 27 05/16/2020 0912   ALKPHOS 73 05/16/2020 0912   BILITOT 0.7 05/16/2020 0912   GFRNONAA >60 05/16/2020 0912   GFRAA >60 05/16/2020 0912       Component Value Date/Time   WBC 12.5 (H) 05/16/2020 0912   RBC 5.12 05/16/2020 0912   HGB 15.3  05/16/2020 0912   HCT 45.6 05/16/2020 0912   PLT 392 05/16/2020 0912   MCV 89.1 05/16/2020 0912   MCH 29.9 05/16/2020 0912   MCHC 33.6 05/16/2020 0912   RDW 12.7 05/16/2020 0912   LYMPHSABS 2.4 04/19/2020 1252   MONOABS 0.8 04/19/2020 1252   EOSABS 0.0 04/19/2020 1252   BASOSABS 0.1 04/19/2020 1252    No results found for: POCLITH, LITHIUM   No results found for: PHENYTOIN, PHENOBARB, VALPROATE, CBMZ   .res Assessment: Plan:    PDMP reviewed  Continue:  1. Perphenazine 6mg  BID (4mg  and 2mg  scripts) 2. Wellbutrin XL 300mg  daily 3. Lamictal 200mg  at hs 4. Xanax 0.5mg  prn   RTC 4 weeks  Patient advised to contact office with any questions, adverse effects, or acute worsening in signs and symptoms.  Discussed potential metabolic side effects associated with atypical antipsychotics, as well as potential risk for movement side effects. Advised pt to contact office if movement side effects occur.   Discussed potential benefits, risk, and side effects of benzodiazepines to include potential risk of tolerance and dependence, as well as possible drowsiness.  Advised patient not to drive if experiencing drowsiness and to take lowest possible effective dose to minimize risk of dependence and tolerance.    Diagnoses and all orders for this visit:  ADHD, predominantly inattentive type  GAD (generalized anxiety disorder)  Bipolar I disorder (HCC) -     lamoTRIgine (LAMICTAL) 200 MG tablet; Take 1 tablet (200 mg total) by mouth at bedtime. -     perphenazine (TRILAFON) 2 MG tablet; Take 1 tablet (2 mg) by mouth with a 4 mg tablet twice a day (total of 6 mg bid) -     perphenazine (TRILAFON) 4 MG tablet; Take 1 tablet (4 mg) by mouth with a 2 mg tablet twice a day. (6 mg bid)  Insomnia, unspecified type  Major depressive disorder, recurrent episode, moderate (HCC) -     buPROPion (WELLBUTRIN XL) 300 MG 24 hr tablet; Take 1 tablet (300 mg total) by mouth daily.     Please see  After Visit Summary for patient specific instructions.  Future Appointments  Date Time Provider Department Center  08/10/2020  5:20 PM Marinda Tyer, Thereasa Soloegina Nattalie, NP CP-[facility] None    No orders of the defined types were placed in this encounter.   -------------------------------

## 2020-07-14 ENCOUNTER — Telehealth: Payer: Self-pay | Admitting: Adult Health

## 2020-07-14 NOTE — Telephone Encounter (Signed)
Mother called and said that you were going to leave Adam Larsen on the perphenazine but you only called in a 3 day supply.  Please call in a 30 day supply.  Send to [facility].  She is concerned about him not continuing on the medication.  Of note we do not have an ROI for his mother.

## 2020-07-17 ENCOUNTER — Other Ambulatory Visit: Payer: Self-pay | Admitting: Adult Health

## 2020-07-17 DIAGNOSIS — F319 Bipolar disorder, unspecified: Secondary | ICD-10-CM

## 2020-07-17 MED ORDER — PERPHENAZINE 2 MG PO TABS: ORAL_TABLET | ORAL | 2 refills | Status: DC

## 2020-07-17 MED ORDER — PERPHENAZINE 4 MG PO TABS: ORAL_TABLET | ORAL | 2 refills | Status: DC

## 2020-07-17 NOTE — Telephone Encounter (Signed)
Scripts sent

## 2020-08-10 ENCOUNTER — Ambulatory Visit (INDEPENDENT_AMBULATORY_CARE_PROVIDER_SITE_OTHER): Payer: 59 | Admitting: Adult Health

## 2020-08-10 ENCOUNTER — Encounter: Payer: Self-pay | Admitting: Adult Health

## 2020-08-10 ENCOUNTER — Other Ambulatory Visit: Payer: Self-pay

## 2020-08-10 DIAGNOSIS — F411 Generalized anxiety disorder: Secondary | ICD-10-CM | POA: Diagnosis not present

## 2020-08-10 DIAGNOSIS — F319 Bipolar disorder, unspecified: Secondary | ICD-10-CM | POA: Diagnosis not present

## 2020-08-10 DIAGNOSIS — G4700 Insomnia, unspecified: Secondary | ICD-10-CM

## 2020-08-10 DIAGNOSIS — F900 Attention-deficit hyperactivity disorder, predominantly inattentive type: Secondary | ICD-10-CM

## 2020-08-10 NOTE — Progress Notes (Signed)
Adam Larsen [id number] May 04, 1986 34 y.o.  Subjective:   Patient ID:  Adam Larsen. is a 34 y.o. (DOB [birth year]) male.  Chief Complaint: No chief complaint on file.   HPI Adam Larsen. presents to the office today for follow-up of MDD, GAD, insomnia, BPD1, and ADHD.  Describes mood today as "ok". Pleasant. Mood symptoms - denies depression, anxiety, and irritability. Stating "I'm doing pretty good". Feels like medications are working well for him. Denies any side effects. Trying to push himself to get out more. Planning to get a part time job. Received first Covid vaccine yesterday.  Improved interest and motivation. Taking medications as prescribed.  Energy levels "alright I guess". Active, does not have a regular exercise routine.  Enjoys some usual interests and activities. Single. Lives with parents. Spending time with family. Getting out more. Appetite adequate. Weight gain - 3 pounds - 197 pounds. Sleeps well most nights. Averages 3 to 4 hours. Napping during the day - 1/2 to 1 hour. Plans to restart Trazadone at night.  Focus and concentration "good". Completing tasks. Managing aspects of household. Planning to get a part time job. Denies SI or HI.  Denies AH or VH.  History of seeing therapists.   Previous medication trials: [name]   PHQ2-9     ED from 05/16/2020 in [facility] EM[facility]Y DEPARTMENT  PHQ-2 Total Score 6  PHQ-9 Total Score 17       Review of Systems:  Review of Systems  Musculoskeletal: Negative for gait problem.  Neurological: Negative for tremors.  Psychiatric/Behavioral:       Please refer to HPI    Medications: I have reviewed the patient's current medications.  Current Outpatient Medications  Medication Sig Dispense Refill   Ascorbic Acid (VITAMIN C) 1000 MG tablet Take 2,000 mg by mouth daily.     buPROPion (WELLBUTRIN XL) 300 MG 24 hr tablet Take 1 tablet (300 mg total) by mouth daily. 30 tablet  2   cholecalciferol (VITAMIN D3) 25 MCG (1000 UNIT) tablet Take 2,000 Units by mouth daily.     lamoTRIgine (LAMICTAL) 200 MG tablet Take 1 tablet (200 mg total) by mouth at bedtime. 30 tablet 2   loratadine (CLARITIN) 10 MG tablet Take 1 tablet (10 mg total) by mouth daily. 30 tablet 0   Multiple Vitamin (MULTIVITAMIN) capsule Take 1 capsule by mouth daily.     omega-3 acid ethyl esters (LOVAZA) 1 g capsule Take 1 capsule (1 g total) by mouth 2 (two) times daily. 60 capsule 0   Omega-3 Fatty Acids (FISH OIL) 1000 MG CAPS Take 1,000 mg by mouth daily.     perphenazine (TRILAFON) 2 MG tablet Take 1 tablet (2 mg) by mouth with a 4 mg tablet twice a day (total of 6 mg bid) 60 tablet 2   perphenazine (TRILAFON) 4 MG tablet Take 1 tablet (4 mg) by mouth with a 2 mg tablet twice a day. (6 mg bid) 60 tablet 2   No current facility-administered medications for this visit.    Medication Side Effects: None  Allergies: No Known Allergies  Past Medical History:  Diagnosis Date   Anxiety    Bipolar 1 disorder (HCC)    Depression    Mania (HCC)    Suicidal ideations     Family History  Problem Relation Age of Onset   Schizophrenia Paternal Grandmother     Social History   Socioeconomic History   Marital status: Single  Spouse name: Not on file   Number of children: 0   Years of education: 10 or 55   Highest education level: 11th grade  Occupational History   Not on file  Tobacco Use   Smoking status: Current Every Day Smoker    Years: 10.00    Types: E-cigarettes   Smokeless tobacco: Never Used  Vaping Use   Vaping Use: Every day   Substances: Nicotine, Flavoring, Nicotine-salt  Substance and Sexual Activity   Alcohol use: Never   Drug use: Never   Sexual activity: Not Currently  Other Topics Concern   Not on file  Social History Narrative   Pt lives with his parents. States he has been diagnosed with ADHD, dyslexia, dysgraphia and learning  disabilities. Finished 10 th or 11 th grade but is working on his GED through an online high school program. Pt is currently unemployed.   Social Determinants of Health   Financial Resource Strain:    Difficulty of Paying Living Expenses: Not on file  Food Insecurity:    Worried About Programme researcher, broadcasting/film/video in the Last Year: Not on file   [employer] of Food in the Last Year: Not on file  Transportation Needs:    Lack of Transportation (Medical): Not on file   Lack of Transportation (Non-Medical): Not on file  Physical Activity:    Days of Exercise per Week: Not on file   Minutes of Exercise per Session: Not on file  Stress:    Feeling of Stress : Not on file  Social Connections:    Frequency of Communication with Friends and Family: Not on file   Frequency of Social Gatherings with Friends and Family: Not on file   Attends Religious Services: Not on file   Active Member of Clubs or Organizations: Not on file   Attends Banker Meetings: Not on file   Marital Status: Not on file  Intimate Partner Violence:    Fear of Current or Ex-Partner: Not on file   Emotionally Abused: Not on file   Physically Abused: Not on file   Sexually Abused: Not on file    Past Medical History, Surgical history, Social history, and Family history were reviewed and updated as appropriate.   Please see review of systems for further details on the patient's review from today.   Objective:   Physical Exam:  There were no vitals taken for this visit.  Physical Exam Constitutional:      General: He is not in acute distress. Musculoskeletal:        General: No deformity.  Neurological:     Mental Status: He is alert and oriented to person, place, and time.     Coordination: Coordination normal.  Psychiatric:        Attention and Perception: Attention and perception normal. He does not perceive auditory or visual hallucinations.        Mood and Affect: Mood normal. Mood is  not anxious or depressed. Affect is not labile, blunt, angry or inappropriate.        Speech: Speech normal.        Behavior: Behavior normal.        Thought Content: Thought content normal. Thought content is not paranoid or delusional. Thought content does not include homicidal or suicidal ideation. Thought content does not include homicidal or suicidal plan.        Cognition and Memory: Cognition and memory normal.        Judgment: Judgment  normal.     Comments: Insight intact     Lab Review:     Component Value Date/Time   NA 140 05/16/2020 0912   K 3.6 05/16/2020 0912   CL 105 05/16/2020 0912   CO2 24 05/16/2020 0912   GLUCOSE 202 (H) 05/16/2020 0912   BUN 11 05/16/2020 0912   CREATININE 1.05 05/16/2020 0912   CALCIUM 9.6 05/16/2020 0912   PROT 7.5 05/16/2020 0912   ALBUMIN 4.3 05/16/2020 0912   AST 20 05/16/2020 0912   ALT 27 05/16/2020 0912   ALKPHOS 73 05/16/2020 0912   BILITOT 0.7 05/16/2020 0912   GFRNONAA >60 05/16/2020 0912   GFRAA >60 05/16/2020 0912       Component Value Date/Time   WBC 12.5 (H) 05/16/2020 0912   RBC 5.12 05/16/2020 0912   HGB 15.3 05/16/2020 0912   HCT 45.6 05/16/2020 0912   PLT 392 05/16/2020 0912   MCV 89.1 05/16/2020 0912   MCH 29.9 05/16/2020 0912   MCHC 33.6 05/16/2020 0912   RDW 12.7 05/16/2020 0912   LYMPHSABS 2.4 04/19/2020 1252   MONOABS 0.8 04/19/2020 1252   EOSABS 0.0 04/19/2020 1252   BASOSABS 0.1 04/19/2020 1252    No results found for: POCLITH, LITHIUM   No results found for: PHENYTOIN, PHENOBARB, VALPROATE, CBMZ   .res Assessment: Plan:    Plan:  1. Perphenazine 6mg  BID (4mg  and 2mg  scripts) 2. Wellbutrin XL 300mg  daily 3. Lamictal 200mg  at hs 4. Xanax 0.5mg  prn - not taking    RTC 4 weeks  Patient advised to contact office with any questions, adverse effects, or acute worsening in signs and symptoms.  Discussed potential metabolic side effects associated with atypical antipsychotics, as well as  potential risk for movement side effects. Advised pt to contact office if movement side effects occur.   Discussed potential benefits, risk, and side effects of benzodiazepines to include potential risk of tolerance and dependence, as well as possible drowsiness.  Advised patient not to drive if experiencing drowsiness and to take lowest possible effective dose to minimize risk of dependence and tolerance.   Diagnoses and all orders for this visit:  Bipolar I disorder (HCC)  ADHD, predominantly inattentive type  GAD (generalized anxiety disorder)  Insomnia, unspecified type     Please see After Visit Summary for patient specific instructions.  Future Appointments  Date Time Provider Department Center  08/28/2020 10:00 AM , [facility] CP-CP None  09/07/2020  9:00 AM Judit Awad, , NP CP-CP None    No orders of the defined types were placed in this encounter.   -------------------------------

## 2020-08-28 ENCOUNTER — Other Ambulatory Visit: Payer: Self-pay

## 2020-08-28 ENCOUNTER — Ambulatory Visit (INDEPENDENT_AMBULATORY_CARE_PROVIDER_SITE_OTHER): Payer: 59 | Admitting: Mental Health

## 2020-08-28 ENCOUNTER — Encounter (INDEPENDENT_AMBULATORY_CARE_PROVIDER_SITE_OTHER): Payer: Self-pay

## 2020-08-28 DIAGNOSIS — F900 Attention-deficit hyperactivity disorder, predominantly inattentive type: Secondary | ICD-10-CM

## 2020-08-28 DIAGNOSIS — F331 Major depressive disorder, recurrent, moderate: Secondary | ICD-10-CM | POA: Diagnosis not present
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
## 2020-09-07 ENCOUNTER — Telehealth: Payer: Self-pay | Admitting: Adult Health

## 2020-09-07 ENCOUNTER — Encounter: Payer: Self-pay | Admitting: Adult Health

## 2020-09-07 ENCOUNTER — Ambulatory Visit (INDEPENDENT_AMBULATORY_CARE_PROVIDER_SITE_OTHER): Payer: 59 | Admitting: Adult Health

## 2020-09-07 ENCOUNTER — Other Ambulatory Visit: Payer: Self-pay

## 2020-09-07 ENCOUNTER — Other Ambulatory Visit: Payer: Self-pay | Admitting: Adult Health

## 2020-09-07 DIAGNOSIS — G4700 Insomnia, unspecified: Secondary | ICD-10-CM

## 2020-09-07 DIAGNOSIS — F331 Major depressive disorder, recurrent, moderate: Secondary | ICD-10-CM

## 2020-09-07 DIAGNOSIS — F319 Bipolar disorder, unspecified: Secondary | ICD-10-CM

## 2020-09-07 MED ORDER — PERPHENAZINE 2 MG PO TABS: ORAL_TABLET | ORAL | 2 refills | Status: DC

## 2020-09-07 MED ORDER — LAMOTRIGINE 200 MG PO TABS: 200.0000 mg | ORAL_TABLET | Freq: Every day | ORAL | 2 refills | Status: DC

## 2020-09-07 MED ORDER — TRAZODONE HCL 50 MG PO TABS: ORAL_TABLET | ORAL | 2 refills | Status: DC

## 2020-09-07 MED ORDER — BUPROPION HCL ER (XL) 300 MG PO TB24: 300.0000 mg | ORAL_TABLET | Freq: Every day | ORAL | 2 refills | Status: DC

## 2020-09-07 MED ORDER — PERPHENAZINE 4 MG PO TABS: ORAL_TABLET | ORAL | 2 refills | Status: DC

## 2020-09-07 NOTE — Telephone Encounter (Signed)
Patient called back with info

## 2020-09-07 NOTE — Progress Notes (Signed)
Adam Larsen [id number] 1986/06/21 34 y.o.  Subjective:   Patient ID:  Adam Delpilar. is a 34 y.o. (DOB 14-Jun-1986) male.  Chief Complaint: No chief complaint on file.   HPI Adam Larsen. presents to the office today for follow-up of MDD, GAD, insomnia, BPD1, and ADHD.  Describes mood today as "ok". Pleasant. Mood symptoms - denies depression, anxiety, and irritability. Stating "I'm doing alright". Trying to get out more. Talking with friends. Tries to talk to someone every day. Has started seeing [name] for therapy. Hard to get motivated. Feels like medications continue to work well. Denies any side effects. Improved interest and motivation. Taking medications as prescribed.  Energy levels stable. Active, does not have a regular exercise routine.  Enjoys some usual interests and activities. Single. Lives with parents. Spending time with family.  Appetite adequate. Weight stable 197 pounds. Sleeps better some nights than others. Averages 5 hours - waking up a lot - difficulties staying asleep. Napping during the day - early morning. Focus and concentration "good". Completing tasks. Managing aspects of household. Planning to look for a job after the holidays. Denies SI or HI.  Denies AH or VH.    Previous medication trials: [name]   PHQ2-9     ED from 05/16/2020 in [facility] EM[facility]PARTMENT  PHQ-2 Total Score 6  PHQ-9 Total Score 17       Review of Systems:  Review of Systems  Musculoskeletal: Negative for gait problem.  Neurological: Negative for tremors.  Psychiatric/Behavioral:       Please refer to HPI    Medications: I have reviewed the patient's current medications.  Current Outpatient Medications  Medication Sig Dispense Refill  . Ascorbic Acid (VITAMIN C) 1000 MG tablet Take 2,000 mg by mouth daily.    Marland Kitchen buPROPion (WELLBUTRIN XL) 300 MG 24 hr tablet Take 1 tablet (300 mg total) by mouth daily. 30 tablet 2  .  cholecalciferol (VITAMIN D3) 25 MCG (1000 UNIT) tablet Take 2,000 Units by mouth daily.    Marland Kitchen lamoTRIgine (LAMICTAL) 200 MG tablet Take 1 tablet (200 mg total) by mouth at bedtime. 30 tablet 2  . loratadine (CLARITIN) 10 MG tablet Take 1 tablet (10 mg total) by mouth daily. 30 tablet 0  . Multiple Vitamin (MULTIVITAMIN) capsule Take 1 capsule by mouth daily.    Marland Kitchen omega-3 acid ethyl esters (LOVAZA) 1 g capsule Take 1 capsule (1 g total) by mouth 2 (two) times daily. 60 capsule 0  . Omega-3 Fatty Acids (FISH OIL) 1000 MG CAPS Take 1,000 mg by mouth daily.    Marland Kitchen perphenazine (TRILAFON) 2 MG tablet Take 1 tablet (2 mg) by mouth with a 4 mg tablet twice a day (total of 6 mg bid) 60 tablet 2  . perphenazine (TRILAFON) 4 MG tablet Take 1 tablet (4 mg) by mouth with a 2 mg tablet twice a day. (6 mg bid) 60 tablet 2   No current facility-administered medications for this visit.    Medication Side Effects: None  Allergies: No Known Allergies  Past Medical History:  Diagnosis Date  . Anxiety   . Bipolar 1 disorder (HCC)   . Depression   . Mania (HCC)   . Suicidal ideations     Family History  Problem Relation Age of Onset  . Schizophrenia Paternal Grandmother     Social History   Socioeconomic History  . Marital status: Single    Spouse name: Not on file  .  Number of children: 0  . Years of education: 10 or 36  . Highest education level: 11th grade  Occupational History  . Not on file  Tobacco Use  . Smoking status: Current Every Day Smoker    Years: 10.00    Types: E-cigarettes  . Smokeless tobacco: Never Used  Vaping Use  . Vaping Use: Every day  . Substances: Nicotine, Flavoring, Nicotine-salt  Substance and Sexual Activity  . Alcohol use: Never  . Drug use: Never  . Sexual activity: Not Currently  Other Topics Concern  . Not on file  Social History Narrative   Pt lives with his parents. States he has been diagnosed with ADHD, dyslexia, dysgraphia and learning  disabilities. Finished 10 th or 11 th grade but is working on his GED through an online high school program. Pt is currently unemployed.   Social Determinants of Health   Financial Resource Strain:   . Difficulty of Paying Living Expenses: Not on file  Food Insecurity:   . Worried About Programme researcher, broadcasting/film/video in the Last Year: Not on file  . Ran Out of Food in the Last Year: Not on file  Transportation Needs:   . Lack of Transportation (Medical): Not on file  . Lack of Transportation (Non-Medical): Not on file  Physical Activity:   . Days of Exercise per Week: Not on file  . Minutes of Exercise per Session: Not on file  Stress:   . Feeling of Stress : Not on file  Social Connections:   . Frequency of Communication with Friends and Family: Not on file  . Frequency of Social Gatherings with Friends and Family: Not on file  . Attends Religious Services: Not on file  . Active Member of Clubs or Organizations: Not on file  . Attends Banker Meetings: Not on file  . Marital Status: Not on file  Intimate Partner Violence:   . Fear of Current or Ex-Partner: Not on file  . Emotionally Abused: Not on file  . Physically Abused: Not on file  . Sexually Abused: Not on file    Past Medical History, Surgical history, Social history, and Family history were reviewed and updated as appropriate.   Please see review of systems for further details on the patient's review from today.   Objective:   Physical Exam:  There were no vitals taken for this visit.  Physical Exam Constitutional:      General: He is not in acute distress. Musculoskeletal:        General: No deformity.  Neurological:     Mental Status: He is alert and oriented to person, place, and time.     Coordination: Coordination normal.  Psychiatric:        Attention and Perception: Attention and perception normal. He does not perceive auditory or visual hallucinations.        Mood and Affect: Mood normal. Mood is  not anxious or depressed. Affect is not labile, blunt, angry or inappropriate.        Speech: Speech normal.        Behavior: Behavior normal.        Thought Content: Thought content normal. Thought content is not paranoid or delusional. Thought content does not include homicidal or suicidal ideation. Thought content does not include homicidal or suicidal plan.        Cognition and Memory: Cognition and memory normal.        Judgment: Judgment normal.     Comments: Insight  intact     Lab Review:     Component Value Date/Time   NA 140 05/16/2020 0912   K 3.6 05/16/2020 0912   CL 105 05/16/2020 0912   CO2 24 05/16/2020 0912   GLUCOSE 202 (H) 05/16/2020 0912   BUN 11 05/16/2020 0912   CREATININE 1.05 05/16/2020 0912   CALCIUM 9.6 05/16/2020 0912   PROT 7.5 05/16/2020 0912   ALBUMIN 4.3 05/16/2020 0912   AST 20 05/16/2020 0912   ALT 27 05/16/2020 0912   ALKPHOS 73 05/16/2020 0912   BILITOT 0.7 05/16/2020 0912   GFRNONAA >60 05/16/2020 0912   GFRAA >60 05/16/2020 0912       Component Value Date/Time   WBC 12.5 (H) 05/16/2020 0912   RBC 5.12 05/16/2020 0912   HGB 15.3 05/16/2020 0912   HCT 45.6 05/16/2020 0912   PLT 392 05/16/2020 0912   MCV 89.1 05/16/2020 0912   MCH 29.9 05/16/2020 0912   MCHC 33.6 05/16/2020 0912   RDW 12.7 05/16/2020 0912   LYMPHSABS 2.4 04/19/2020 1252   MONOABS 0.8 04/19/2020 1252   EOSABS 0.0 04/19/2020 1252   BASOSABS 0.1 04/19/2020 1252    No results found for: POCLITH, LITHIUM   No results found for: PHENYTOIN, PHENOBARB, VALPROATE, CBMZ   .res Assessment: Plan:    Plan:  Continue   1. Perphenazine 6mg  BID (4mg  and 2mg  scripts) 2. Wellbutrin XL 300mg  daily 3. Lamictal 200mg  at hs 4. Xanax 0.5mg  prn - not taking  5. Trazadone - will call with current dose.   RTC 4 weeks  Patient advised to contact office with any questions, adverse effects, or acute worsening in signs and symptoms.  Discussed potential metabolic side effects  associated with atypical antipsychotics, as well as potential risk for movement side effects. Advised pt to contact office if movement side effects occur.   Discussed potential benefits, risk, and side effects of benzodiazepines to include potential risk of tolerance and dependence, as well as possible drowsiness.  Advised patient not to drive if experiencing drowsiness and to take lowest possible effective dose to minimize risk of dependence and tolerance.   Diagnoses and all orders for this visit:  Major depressive disorder, recurrent episode, moderate (HCC) -     buPROPion (WELLBUTRIN XL) 300 MG 24 hr tablet; Take 1 tablet (300 mg total) by mouth daily.  Bipolar I disorder (HCC) -     lamoTRIgine (LAMICTAL) 200 MG tablet; Take 1 tablet (200 mg total) by mouth at bedtime. -     perphenazine (TRILAFON) 2 MG tablet; Take 1 tablet (2 mg) by mouth with a 4 mg tablet twice a day (total of 6 mg bid) -     perphenazine (TRILAFON) 4 MG tablet; Take 1 tablet (4 mg) by mouth with a 2 mg tablet twice a day. (6 mg bid)     Please see After Visit Summary for patient specific instructions.  Future Appointments  Date Time Provider Department Center  09/28/2020 10:00 AM , [facility] CP-CP None  10/05/2020 10:20 AM Tailyn Hantz, , NP CP-CP None  10/09/2020 10:00 AM [name] Session, [facility] CP-CP None  10/23/2020 10:00 AM Thereasa Solo, [facility] CP-CP None  11/06/2020 10:00 AM [facility], [facility] CP-CP None    No orders of the defined types were placed in this encounter.   -------------------------------

## 2020-09-07 NOTE — Telephone Encounter (Signed)
Reviewed thanks! 

## 2020-09-07 NOTE — Telephone Encounter (Signed)
Pt lm for [name] that he is on Trazadone 50 mg.

## 2020-09-07 NOTE — Telephone Encounter (Signed)
Noted - Trazadone 50mg  - 1 to 2 sent in.

## 2020-09-28 ENCOUNTER — Other Ambulatory Visit: Payer: Self-pay

## 2020-09-28 ENCOUNTER — Ambulatory Visit (INDEPENDENT_AMBULATORY_CARE_PROVIDER_SITE_OTHER): Payer: 59 | Admitting: Mental Health

## 2020-09-28 DIAGNOSIS — F331 Major depressive disorder, recurrent, moderate: Secondary | ICD-10-CM | POA: Diagnosis not present
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
## 2020-10-05 ENCOUNTER — Ambulatory Visit (INDEPENDENT_AMBULATORY_CARE_PROVIDER_SITE_OTHER): Payer: 59 | Admitting: Adult Health

## 2020-10-05 ENCOUNTER — Other Ambulatory Visit: Payer: Self-pay

## 2020-10-05 ENCOUNTER — Encounter: Payer: Self-pay | Admitting: Adult Health

## 2020-10-05 DIAGNOSIS — F411 Generalized anxiety disorder: Secondary | ICD-10-CM

## 2020-10-05 DIAGNOSIS — F319 Bipolar disorder, unspecified: Secondary | ICD-10-CM

## 2020-10-05 DIAGNOSIS — F331 Major depressive disorder, recurrent, moderate: Secondary | ICD-10-CM

## 2020-10-05 DIAGNOSIS — G4700 Insomnia, unspecified: Secondary | ICD-10-CM

## 2020-10-05 MED ORDER — LAMOTRIGINE 150 MG PO TABS: 150.0000 mg | ORAL_TABLET | Freq: Every day | ORAL | 2 refills | Status: DC

## 2020-10-05 NOTE — Progress Notes (Signed)
Adam Larsen [id number] [birth year] 34 y.o.  Subjective:   Patient ID:  Adam Larsen. is a 34 y.o. (DOB 1986/08/10) male.  Chief Complaint: No chief complaint on file.   HPI Adam Larsen. presents to the office today for follow-up of MDD, GAD, insomnia, BPD1, and ADHD.  Describes mood today as "ok". Pleasant. Mood symptoms - denies depression, anxiety, and irritability. Stating "I'm not feeling a lot of joy". Feels like he is in "neutral". Hard to enjoy the things he used to enjoy doing. Does better in the mornings and mood declines in the afternoons. Trying to get out more - going to friends houses. Wanting to discontinue Lamictal. Seeing [name] for therapy. Feels like medications continue to work well. Denies any side effects. Improved interest and motivation. Taking medications as prescribed.  Energy levels stable. Active, does not have a regular exercise routine.  Enjoys some usual interests and activities. Single. Lives with parents. Spending time with family.  Appetite adequate. Weight stable 196 pounds. Sleeps better some nights than others. Averages 5 hours. Napping 1 to 2 hours during the day. Focus and concentration. Completing tasks. Managing aspects of household. Plan to look for a job after Christmas. Denies SI or HI.  Denies AH or VH.    Previous medication trials: Leafy Kindle, Thorazine    PHQ2-9   Flowsheet Row ED from 05/16/2020 in [facility] EMERGENCY DEPARTMENT  PHQ-2 Total Score 6  PHQ-9 Total Score 17       Review of Systems:  Review of Systems  Musculoskeletal: Negative for gait problem.  Neurological: Negative for tremors.  Psychiatric/Behavioral:       Please refer to HPI    Medications: I have reviewed the patient's current medications.  Current Outpatient Medications  Medication Sig Dispense Refill  . Ascorbic Acid (VITAMIN C) 1000 MG tablet Take 2,000 mg by mouth daily.    Marland Kitchen buPROPion (WELLBUTRIN XL) 300 MG 24  hr tablet Take 1 tablet (300 mg total) by mouth daily. 30 tablet 2  . cholecalciferol (VITAMIN D3) 25 MCG (1000 UNIT) tablet Take 2,000 Units by mouth daily.    Marland Kitchen lamoTRIgine (LAMICTAL) 150 MG tablet Take 1 tablet (150 mg total) by mouth at bedtime. 30 tablet 2  . loratadine (CLARITIN) 10 MG tablet Take 1 tablet (10 mg total) by mouth daily. 30 tablet 0  . Multiple Vitamin (MULTIVITAMIN) capsule Take 1 capsule by mouth daily.    Marland Kitchen omega-3 acid ethyl esters (LOVAZA) 1 g capsule Take 1 capsule (1 g total) by mouth 2 (two) times daily. 60 capsule 0  . Omega-3 Fatty Acids (FISH OIL) 1000 MG CAPS Take 1,000 mg by mouth daily.    Marland Kitchen perphenazine (TRILAFON) 2 MG tablet Take 1 tablet (2 mg) by mouth with a 4 mg tablet twice a day (total of 6 mg bid) 60 tablet 2  . perphenazine (TRILAFON) 4 MG tablet Take 1 tablet (4 mg) by mouth with a 2 mg tablet twice a day. (6 mg bid) 60 tablet 2  . traZODone (DESYREL) 50 MG tablet Take one to two tablets at bedtime for sleep. 60 tablet 2   No current facility-administered medications for this visit.    Medication Side Effects: None  Allergies: No Known Allergies  Past Medical History:  Diagnosis Date  . Anxiety   . Bipolar 1 disorder (HCC)   . Depression   . Mania (HCC)   . Suicidal ideations     Family History  Problem  Relation Age of Onset  . Schizophrenia Paternal Grandmother     Social History   Socioeconomic History  . Marital status: Single    Spouse name: Not on file  . Number of children: 0  . Years of education: 10 or 77  . Highest education level: 11th grade  Occupational History  . Not on file  Tobacco Use  . Smoking status: Current Every Day Smoker    Years: 10.00    Types: E-cigarettes  . Smokeless tobacco: Never Used  Vaping Use  . Vaping Use: Every day  . Substances: Nicotine, Flavoring, Nicotine-salt  Substance and Sexual Activity  . Alcohol use: Never  . Drug use: Never  . Sexual activity: Not Currently  Other Topics  Concern  . Not on file  Social History Narrative   Pt lives with his parents. States he has been diagnosed with ADHD, dyslexia, dysgraphia and learning disabilities. Finished 10 th or 11 th grade but is working on his GED through an online high school program. Pt is currently unemployed.   Social Determinants of Health   Financial Resource Strain: Not on file  Food Insecurity: Not on file  Transportation Needs: Not on file  Physical Activity: Not on file  Stress: Not on file  Social Connections: Not on file  Intimate Partner Violence: Not on file    Past Medical History, Surgical history, Social history, and Family history were reviewed and updated as appropriate.   Please see review of systems for further details on the patient's review from today.   Objective:   Physical Exam:  There were no vitals taken for this visit.  Physical Exam Constitutional:      General: He is not in acute distress. Musculoskeletal:        General: No deformity.  Neurological:     Mental Status: He is alert and oriented to person, place, and time.     Coordination: Coordination normal.  Psychiatric:        Attention and Perception: Attention and perception normal. He does not perceive auditory or visual hallucinations.        Mood and Affect: Mood normal. Mood is not anxious or depressed. Affect is not labile, blunt, angry or inappropriate.        Speech: Speech normal.        Behavior: Behavior normal.        Thought Content: Thought content normal. Thought content is not paranoid or delusional. Thought content does not include homicidal or suicidal ideation. Thought content does not include homicidal or suicidal plan.        Cognition and Memory: Cognition and memory normal.        Judgment: Judgment normal.     Comments: Insight intact     Lab Review:     Component Value Date/Time   NA 140 05/16/2020 0912   K 3.6 05/16/2020 0912   CL 105 05/16/2020 0912   CO2 24 05/16/2020 0912    GLUCOSE 202 (H) 05/16/2020 0912   BUN 11 05/16/2020 0912   CREATININE 1.05 05/16/2020 0912   CALCIUM 9.6 05/16/2020 0912   PROT 7.5 05/16/2020 0912   ALBUMIN 4.3 05/16/2020 0912   AST 20 05/16/2020 0912   ALT 27 05/16/2020 0912   ALKPHOS 73 05/16/2020 0912   BILITOT 0.7 05/16/2020 0912   GFRNONAA >60 05/16/2020 0912   GFRAA >60 05/16/2020 0912       Component Value Date/Time   WBC 12.5 (H) 05/16/2020 0912  RBC 5.12 05/16/2020 0912   HGB 15.3 05/16/2020 0912   HCT 45.6 05/16/2020 0912   PLT 392 05/16/2020 0912   MCV 89.1 05/16/2020 0912   MCH 29.9 05/16/2020 0912   MCHC 33.6 05/16/2020 0912   RDW 12.7 05/16/2020 0912   LYMPHSABS 2.4 04/19/2020 1252   MONOABS 0.8 04/19/2020 1252   EOSABS 0.0 04/19/2020 1252   BASOSABS 0.1 04/19/2020 1252    No results found for: POCLITH, LITHIUM   No results found for: PHENYTOIN, PHENOBARB, VALPROATE, CBMZ   .res Assessment: Plan:    Plan:  Continue   1. Perphenazine 6mg  BID (4mg  and 2mg  scripts) 2. Wellbutrin XL 300mg  daily 3. Decrease Lamictal 200mg  to 150mg  at hs 4. Xanax 0.5mg  prn - not taking  5. Trazadone - will call with current dose.  Will reduce Lamictal by 50mg  and monitor for changes in mood.  RTC 4 weeks  Patient advised to contact office with any questions, adverse effects, or acute worsening in signs and symptoms.  Discussed potential metabolic side effects associated with atypical antipsychotics, as well as potential risk for movement side effects. Advised pt to contact office if movement side effects occur.   Discussed potential benefits, risk, and side effects of benzodiazepines to include potential risk of tolerance and dependence, as well as possible drowsiness.  Advised patient not to drive if experiencing drowsiness and to take lowest possible effective dose to minimize risk of dependence and tolerance.    Diagnoses and all orders for this visit:  Major depressive disorder, recurrent episode, moderate  (HCC)  Insomnia, unspecified type  GAD (generalized anxiety disorder)  Bipolar I disorder (HCC)  Other orders -     lamoTRIgine (LAMICTAL) 150 MG tablet; Take 1 tablet (150 mg total) by mouth at bedtime.     Please see After Visit Summary for patient specific instructions.  Future Appointments  Date Time Provider Department Center  10/09/2020 10:00 AM , [facility] CP-CP None  10/23/2020 10:00 AM , [facility] CP-CP None  11/06/2020 10:00 AM 10/11/2020, [facility] CP-CP None  11/13/2020 10:00 AM 12/21/2020, [facility] CP-CP None  11/20/2020 10:00 AM 11/08/2020, [facility] CP-CP None    No orders of the defined types were placed in this encounter.   -------------------------------

## 2020-10-09 ENCOUNTER — Ambulatory Visit (INDEPENDENT_AMBULATORY_CARE_PROVIDER_SITE_OTHER): Payer: 59 | Admitting: Mental Health

## 2020-10-09 ENCOUNTER — Other Ambulatory Visit: Payer: Self-pay

## 2020-10-09 DIAGNOSIS — F331 Major depressive disorder, recurrent, moderate: Secondary | ICD-10-CM

## 2020-10-09 NOTE — Progress Notes (Signed)
[facility] Note  Name: Adam Larsen. Date: 10/09/20 MRN: [id number]  DOB: [birth year] PCP: Patient, No Pcp Per  Time spent: 56 minutes  Treatment:  Individual therapy  Mental Status Exam:   Appearance:   Casual     Behavior:  Appropriate  Motor:  Normal  Speech/Language:   Clear and Coherent  Affect:  full range  Mood:  depressed and pleasant  Thought process:  normal  Thought content:    WNL  Sensory/Perceptual disturbances:    none  Orientation:  x4  Attention:  Good  Concentration:  Good  Memory:  WNL  Fund of knowledge:   Good  Insight:    Good  Judgment:   Good  Impulse Control:  Good   Reported Symptoms:  Sleep problems-wakes in the middle of the night, low motivation, isolative, intermittent depressed mood, anxiety, struggles with daily hygiene (not showering, brushing teeth etc), self depreciating thoughts  Risk Assessment: Danger to Self:  No Self-injurious Behavior: No Danger to Others: No Duty to Warn:no Physical Aggression / Violence:No  Access to Firearms a concern: No  Gang Involvement:No  Patient / guardian was educated about steps to take if suicide or homicide risk level increases between visits: yes While future psychiatric events cannot be accurately predicted, the patient does not currently require acute inpatient psychiatric care and does not currently meet [facility] involuntary commitment criteria.    Medications: Current Outpatient Medications  Medication Sig Dispense Refill  . Ascorbic Acid (VITAMIN C) 1000 MG tablet Take 2,000 mg by mouth daily.    Marland Kitchen buPROPion (WELLBUTRIN XL) 300 MG 24 hr tablet Take 1 tablet (300 mg total) by mouth daily. 30 tablet 2  . cholecalciferol (VITAMIN D3) 25 MCG (1000 UNIT) tablet Take 2,000 Units by mouth daily.    Marland Kitchen lamoTRIgine (LAMICTAL) 150 MG tablet Take 1 tablet (150 mg total) by mouth at bedtime. 30 tablet 2  . loratadine (CLARITIN) 10 MG tablet Take 1 tablet (10 mg total) by mouth  daily. 30 tablet 0  . Multiple Vitamin (MULTIVITAMIN) capsule Take 1 capsule by mouth daily.    Marland Kitchen omega-3 acid ethyl esters (LOVAZA) 1 g capsule Take 1 capsule (1 g total) by mouth 2 (two) times daily. 60 capsule 0  . Omega-3 Fatty Acids (FISH OIL) 1000 MG CAPS Take 1,000 mg by mouth daily.    Marland Kitchen perphenazine (TRILAFON) 2 MG tablet Take 1 tablet (2 mg) by mouth with a 4 mg tablet twice a day (total of 6 mg bid) 60 tablet 2  . perphenazine (TRILAFON) 4 MG tablet Take 1 tablet (4 mg) by mouth with a 2 mg tablet twice a day. (6 mg bid) 60 tablet 2  . traZODone (DESYREL) 50 MG tablet Take one to two tablets at bedtime for sleep. 60 tablet 2   No current facility-administered medications for this visit.    Subjective:   Patient arrived on time for today's session. Normally likes kayaking, computer games  "it hard for me to get those highs from anything" referring to having low motivation. He stated his medications have been helpful to decrease some of the depression, however, he wants to increase his motivation. We explored ways he can get out of the house more often as he stated this has been an issue. He plans to go kayaking w/ family members over the next few days. Through guided discovery, he identified wanting to eventually meet someone again to possibly date; does not want children "I'm afraid to have  kids, I dont want them to deal with what I do" referring to his MH issues.  He stated his grM had schizophrenia and knowing he copes with depression he does not want to potentially burden children with possibly coping with mental health issues. He went on to share's at a boundary with this friend, was upset with him telling him he needed a break from the friendship currently. He stated that he feels better about doing it this way versus just not calling him back which is done before a couple years ago. He plans on using this time to work on himself, and again making some changes without dealing with this  friendship which he feels is burdensome at times. Other ways to cope and care for himself were explored.  Interventions: Further assessment, CBT, supportive therapy  Diagnoses:    ICD-10-CM   1. Major depressive disorder, recurrent episode, moderate (HCC)  F33.1     Plan: Patient is to use CBT, mindfulness and coping skills to help manage decrease symptoms associated with their diagnosis.    Patient to set boundaries in his current relationship with a friend with which he identified trust issues.   Long-term goal:   Reduce overall level, frequency, and intensity of the feelings of depression and anxiety 7-8/10 to a 0-2/10 in severity for at least 3 consecutive months.   Short-term goal:  Decrease "deflating, self-doubting" and catastrophizing thinking style Decrease anxiety producing self talk such as thinking of the worse possible life outcome Decrease ruminating, which can affect his sleep and increased emotional distress    Assessment of progress:  progressing  [name] Session, [facility]

## 2020-10-23 ENCOUNTER — Other Ambulatory Visit: Payer: Self-pay

## 2020-10-23 ENCOUNTER — Ambulatory Visit (INDEPENDENT_AMBULATORY_CARE_PROVIDER_SITE_OTHER): Payer: 59 | Admitting: Mental Health

## 2020-10-23 DIAGNOSIS — F331 Major depressive disorder, recurrent, moderate: Secondary | ICD-10-CM | POA: Diagnosis not present

## 2020-10-23 NOTE — Progress Notes (Signed)
[facility] Note  Name: Adam Larsen. Date: 10/23/20 MRN: [id number]  DOB: 04-Jan-1986 PCP: Patient, No Pcp Per  Time spent: 52 minutes  Treatment:  Individual therapy  Mental Status Exam:   Appearance:   Casual     Behavior:  Appropriate  Motor:  Normal  Speech/Language:   Clear and Coherent  Affect:  full range  Mood:  depressed and pleasant  Thought process:  normal  Thought content:    WNL  Sensory/Perceptual disturbances:    none  Orientation:  x4  Attention:  Good  Concentration:  Good  Memory:  WNL  Fund of knowledge:   Good  Insight:    Good  Judgment:   Good  Impulse Control:  Good   Reported Symptoms:  Sleep problems-wakes in the middle of the night, low motivation, isolative, intermittent depressed mood, anxiety, struggles with daily hygiene (not showering, brushing teeth etc), self depreciating thoughts  Risk Assessment: Danger to Self:  No Self-injurious Behavior: No Danger to Others: No Duty to Warn:no Physical Aggression / Violence:No  Access to Firearms a concern: No  Gang Involvement:No  Patient / guardian was educated about steps to take if suicide or homicide risk level increases between visits: yes While future psychiatric events cannot be accurately predicted, the patient does not currently require acute inpatient psychiatric care and does not currently meet [facility] involuntary commitment criteria.    Medications: Current Outpatient Medications  Medication Sig Dispense Refill  . Ascorbic Acid (VITAMIN C) 1000 MG tablet Take 2,000 mg by mouth daily.    Marland Kitchen buPROPion (WELLBUTRIN XL) 300 MG 24 hr tablet Take 1 tablet (300 mg total) by mouth daily. 30 tablet 2  . cholecalciferol (VITAMIN D3) 25 MCG (1000 UNIT) tablet Take 2,000 Units by mouth daily.    Marland Kitchen lamoTRIgine (LAMICTAL) 150 MG tablet Take 1 tablet (150 mg total) by mouth at bedtime. 30 tablet 2  . loratadine (CLARITIN) 10 MG tablet Take 1 tablet (10 mg total) by mouth  daily. 30 tablet 0  . Multiple Vitamin (MULTIVITAMIN) capsule Take 1 capsule by mouth daily.    Marland Kitchen omega-3 acid ethyl esters (LOVAZA) 1 g capsule Take 1 capsule (1 g total) by mouth 2 (two) times daily. 60 capsule 0  . Omega-3 Fatty Acids (FISH OIL) 1000 MG CAPS Take 1,000 mg by mouth daily.    Marland Kitchen perphenazine (TRILAFON) 2 MG tablet Take 1 tablet (2 mg) by mouth with a 4 mg tablet twice a day (total of 6 mg bid) 60 tablet 2  . perphenazine (TRILAFON) 4 MG tablet Take 1 tablet (4 mg) by mouth with a 2 mg tablet twice a day. (6 mg bid) 60 tablet 2  . traZODone (DESYREL) 50 MG tablet Take one to two tablets at bedtime for sleep. 60 tablet 2   No current facility-administered medications for this visit.    Subjective:   Patient arrived on time for today's session.  He shared recent experiences with family over the Christmas holiday in which she stated were pleasant.  He continues to verbalize being stable, denies SI.  He shared some details related to his history of psychiatric inpatient which has occurred in late spring for unknown reasons.  He stated that he copes with significant paranoia but declined and wanting to discuss more details surrounding his inpatient admissions today.  He continues to have a boundary set with a friend of many years, going on to continue to identify reasons such as feeling that being around  this friend and his acting overly confident or superior to patient as at least partial catalyst for his feeling more depressed and getting into a crisis state.  He continues to state that he wants to keep this boundary at least for the next month.  It appears he has some self shame associated with his lack of medical stability in the past and we discussed the potential benefits of further discussing this in coming sessions.  Interventions: Further assessment, CBT, supportive therapy  Diagnoses:    ICD-10-CM   1. Major depressive disorder, recurrent episode, moderate (HCC)  F33.1      Plan: Patient is to use CBT, mindfulness and coping skills to help manage decrease symptoms associated with their diagnosis.    Patient to set boundaries in his current relationship with a friend with which he identified trust issues.   Long-term goal:   Reduce overall level, frequency, and intensity of the feelings of depression and anxiety 7-8/10 to a 0-2/10 in severity for at least 3 consecutive months.   Short-term goal:  Decrease "deflating, self-doubting" and catastrophizing thinking style Decrease anxiety producing self talk such as thinking of the worse possible life outcome Decrease ruminating, which can affect his sleep and increased emotional distress    Assessment of progress:  progressing  [name] Session, [facility]

## 2020-11-02 ENCOUNTER — Ambulatory Visit: Payer: 59 | Admitting: Adult Health

## 2020-11-06 ENCOUNTER — Ambulatory Visit: Payer: 59 | Admitting: Mental Health

## 2020-11-13 ENCOUNTER — Ambulatory Visit (INDEPENDENT_AMBULATORY_CARE_PROVIDER_SITE_OTHER): Payer: 59 | Admitting: Mental Health

## 2020-11-13 ENCOUNTER — Other Ambulatory Visit: Payer: Self-pay

## 2020-11-13 DIAGNOSIS — F331 Major depressive disorder, recurrent, moderate: Secondary | ICD-10-CM

## 2020-11-13 NOTE — Progress Notes (Signed)
[facility] Note  Name: Adam Larsen. Date: 11/13/20 MRN: [id number]   DOB: 05/20/1986 PCP: Patient, No Pcp Per  Time spent: 52 minutes  Treatment:  Individual therapy  Mental Status Exam:   Appearance:   Casual     Behavior:  Appropriate  Motor:  Normal  Speech/Language:   Clear and Coherent  Affect:  full range  Mood:  depressed and pleasant  Thought process:  normal  Thought content:    WNL  Sensory/Perceptual disturbances:    none  Orientation:  x4  Attention:  Good  Concentration:  Good  Memory:  WNL  Fund of knowledge:   Good  Insight:    Good  Judgment:   Good  Impulse Control:  Good   Reported Symptoms:  Sleep problems-wakes in the middle of the night, low motivation, isolative, intermittent depressed mood, anxiety, struggles with daily hygiene (not showering, brushing teeth etc), self depreciating thoughts  Risk Assessment: Danger to Self:  No Self-injurious Behavior: No Danger to Others: No Duty to Warn:no Physical Aggression / Violence:No  Access to Firearms a concern: No  Gang Involvement:No  Patient / guardian was educated about steps to take if suicide or homicide risk level increases between visits: yes While future psychiatric events cannot be accurately predicted, the patient does not currently require acute inpatient psychiatric care and does not currently meet [facility] involuntary commitment criteria.    Medications: Current Outpatient Medications  Medication Sig Dispense Refill  . Ascorbic Acid (VITAMIN C) 1000 MG tablet Take 2,000 mg by mouth daily.    Marland Kitchen buPROPion (WELLBUTRIN XL) 300 MG 24 hr tablet Take 1 tablet (300 mg total) by mouth daily. 30 tablet 2  . cholecalciferol (VITAMIN D3) 25 MCG (1000 UNIT) tablet Take 2,000 Units by mouth daily.    Marland Kitchen lamoTRIgine (LAMICTAL) 150 MG tablet Take 1 tablet (150 mg total) by mouth at bedtime. 30 tablet 2  . loratadine (CLARITIN) 10 MG tablet Take 1 tablet (10 mg total) by mouth  daily. 30 tablet 0  . Multiple Vitamin (MULTIVITAMIN) capsule Take 1 capsule by mouth daily.    Marland Kitchen omega-3 acid ethyl esters (LOVAZA) 1 g capsule Take 1 capsule (1 g total) by mouth 2 (two) times daily. 60 capsule 0  . Omega-3 Fatty Acids (FISH OIL) 1000 MG CAPS Take 1,000 mg by mouth daily.    Marland Kitchen perphenazine (TRILAFON) 2 MG tablet Take 1 tablet (2 mg) by mouth with a 4 mg tablet twice a day (total of 6 mg bid) 60 tablet 2  . perphenazine (TRILAFON) 4 MG tablet Take 1 tablet (4 mg) by mouth with a 2 mg tablet twice a day. (6 mg bid) 60 tablet 2  . traZODone (DESYREL) 50 MG tablet Take one to two tablets at bedtime for sleep. 60 tablet 2   No current facility-administered medications for this visit.    Subjective:   Patient arrived on time for today's session.  He shared how he has decided to continue to reach out to one of his friends he is known for years although he thought out maintaining a boundary and not having communication for a while.  He shared how he has limited friendships going on to share details about how he is stopped communicating with some of them recently, giving his reasons.  He maintains that he is continuing to feel some levels of depression but not severe currently.  He identified the need to make more of an effort to be physically active,  engaging in some walking exercise which he feels will help his mood also.  Facilitated his identifying self supportive, motivating self talk to start the process of building healthy habits toward exercise.  Interventions: Further assessment, CBT, supportive therapy  Diagnoses:    ICD-10-CM   1. Major depressive disorder, recurrent episode, moderate (HCC)  F33.1     Plan: Patient is to use CBT, mindfulness and coping skills to help manage decrease symptoms associated with their diagnosis.    Patient to set boundaries in his current relationship with a friend with which he identified trust issues.   Long-term goal:   Reduce overall  level, frequency, and intensity of the feelings of depression and anxiety 7-8/10 to a 0-2/10 in severity for at least 3 consecutive months.   Short-term goal:  Decrease "deflating, self-doubting" and catastrophizing thinking style Decrease anxiety producing self talk such as thinking of the worse possible life outcome Decrease ruminating, which can affect his sleep and increased emotional distress    Assessment of progress:  progressing  [name] Session, [facility]

## 2020-11-20 ENCOUNTER — Encounter: Payer: Self-pay | Admitting: Adult Health

## 2020-11-20 ENCOUNTER — Ambulatory Visit (INDEPENDENT_AMBULATORY_CARE_PROVIDER_SITE_OTHER): Payer: 59 | Admitting: Adult Health

## 2020-11-20 ENCOUNTER — Other Ambulatory Visit: Payer: Self-pay

## 2020-11-20 ENCOUNTER — Ambulatory Visit (INDEPENDENT_AMBULATORY_CARE_PROVIDER_SITE_OTHER): Payer: 59 | Admitting: Mental Health

## 2020-11-20 DIAGNOSIS — F331 Major depressive disorder, recurrent, moderate: Secondary | ICD-10-CM

## 2020-11-20 DIAGNOSIS — F319 Bipolar disorder, unspecified: Secondary | ICD-10-CM

## 2020-11-20 DIAGNOSIS — G4700 Insomnia, unspecified: Secondary | ICD-10-CM | POA: Diagnosis not present

## 2020-11-20 DIAGNOSIS — F411 Generalized anxiety disorder: Secondary | ICD-10-CM

## 2020-11-20 DIAGNOSIS — F900 Attention-deficit hyperactivity disorder, predominantly inattentive type: Secondary | ICD-10-CM | POA: Diagnosis not present

## 2020-11-20 MED ORDER — BUPROPION HCL ER (XL) 300 MG PO TB24: 300.0000 mg | ORAL_TABLET | Freq: Every day | ORAL | 2 refills | Status: DC

## 2020-11-20 MED ORDER — LAMOTRIGINE 150 MG PO TABS: 150.0000 mg | ORAL_TABLET | Freq: Every day | ORAL | 2 refills | Status: DC

## 2020-11-20 MED ORDER — PERPHENAZINE 4 MG PO TABS: ORAL_TABLET | ORAL | 2 refills | Status: DC

## 2020-11-20 MED ORDER — PERPHENAZINE 2 MG PO TABS: ORAL_TABLET | ORAL | 2 refills | Status: DC

## 2020-11-20 NOTE — Progress Notes (Signed)
Adam Larsen [id number] Jan 29, 1986 35 y.o.  Subjective:   Patient ID:  Adam Larsen. is a 35 y.o. (DOB 07-24-1986) male.  Chief Complaint: No chief complaint on file.   HPI Adam Larsen. presents to the office today for follow-up of MDD, GAD, insomnia, BPD1, and ADHD.  Describes mood today as "ok". Pleasant. Mood symptoms - denies depression, anxiety, and irritability. Stating "I'm feeling alright". Experimented with leaving medications off every other day and felt sad. Restarted medications and has taken consistently over past few weeks. Still not feeling "joy". Has gotten out some. Seeing [name] for therapy. Feels like medications keep him from going to a "bad place". Denies any untoward side effects. Varying interest and motivation. Taking medications as prescribed.  Energy levels stable. Active, does not have a regular exercise routine.  Enjoys some usual interests and activities. Single. Lives with parents. Spending time with family.  Appetite adequate. Weight stable 196 pounds. Sleeps better some nights than others. Averages 2 to 3 hours at a time - declined after stopping medications. Napping during the day. Focus and concentration difficulties. Starting tasks and not finishing them. Has things he wants to do, but can't get started on them. Treated for ADHD since childhood. Completing tasks. Managing aspects of household.  Denies SI or HI.  Denies AH or VH.   Previous medication trials: Leafy Kindle, Thorazine  PHQ2-9   Flowsheet Row ED from 05/16/2020 in [facility] EMERGENCY DEPARTMENT  PHQ-2 Total Score 6  PHQ-9 Total Score 17      Review of Systems:  Review of Systems  Musculoskeletal: Negative for gait problem.  Neurological: Negative for tremors.  Psychiatric/Behavioral:       Please refer to HPI    Medications: I have reviewed the patient's current medications.  Current Outpatient Medications  Medication Sig Dispense Refill  .  Ascorbic Acid (VITAMIN C) 1000 MG tablet Take 2,000 mg by mouth daily.    Marland Kitchen buPROPion (WELLBUTRIN XL) 300 MG 24 hr tablet Take 1 tablet (300 mg total) by mouth daily. 30 tablet 2  . cholecalciferol (VITAMIN D3) 25 MCG (1000 UNIT) tablet Take 2,000 Units by mouth daily.    Marland Kitchen lamoTRIgine (LAMICTAL) 150 MG tablet Take 1 tablet (150 mg total) by mouth at bedtime. 30 tablet 2  . loratadine (CLARITIN) 10 MG tablet Take 1 tablet (10 mg total) by mouth daily. 30 tablet 0  . Multiple Vitamin (MULTIVITAMIN) capsule Take 1 capsule by mouth daily.    Marland Kitchen omega-3 acid ethyl esters (LOVAZA) 1 g capsule Take 1 capsule (1 g total) by mouth 2 (two) times daily. 60 capsule 0  . Omega-3 Fatty Acids (FISH OIL) 1000 MG CAPS Take 1,000 mg by mouth daily.    Marland Kitchen perphenazine (TRILAFON) 2 MG tablet Take 1 tablet (2 mg) by mouth with a 4 mg tablet twice a day (total of 6 mg bid) 60 tablet 2  . perphenazine (TRILAFON) 4 MG tablet Take 1 tablet (4 mg) by mouth with a 2 mg tablet twice a day. (6 mg bid) 60 tablet 2  . traZODone (DESYREL) 50 MG tablet Take one to two tablets at bedtime for sleep. 60 tablet 2   No current facility-administered medications for this visit.    Medication Side Effects: None  Allergies: No Known Allergies  Past Medical History:  Diagnosis Date  . Anxiety   . Bipolar 1 disorder (HCC)   . Depression   . Mania (HCC)   . Suicidal ideations  Family History  Problem Relation Age of Onset  . Schizophrenia Paternal Grandmother     Social History   Socioeconomic History  . Marital status: Single    Spouse name: Not on file  . Number of children: 0  . Years of education: 10 or 62  . Highest education level: 11th grade  Occupational History  . Not on file  Tobacco Use  . Smoking status: Current Every Day Smoker    Years: 10.00    Types: E-cigarettes  . Smokeless tobacco: Never Used  Vaping Use  . Vaping Use: Every day  . Substances: Nicotine, Flavoring, Nicotine-salt  Substance  and Sexual Activity  . Alcohol use: Never  . Drug use: Never  . Sexual activity: Not Currently  Other Topics Concern  . Not on file  Social History Narrative   Pt lives with his parents. States he has been diagnosed with ADHD, dyslexia, dysgraphia and learning disabilities. Finished 10 th or 11 th grade but is working on his GED through an online high school program. Pt is currently unemployed.   Social Determinants of Health   Financial Resource Strain: Not on file  Food Insecurity: Not on file  Transportation Needs: Not on file  Physical Activity: Not on file  Stress: Not on file  Social Connections: Not on file  Intimate Partner Violence: Not on file    Past Medical History, Surgical history, Social history, and Family history were reviewed and updated as appropriate.   Please see review of systems for further details on the patient's review from today.   Objective:   Physical Exam:  There were no vitals taken for this visit.  Physical Exam Constitutional:      General: He is not in acute distress. Musculoskeletal:        General: No deformity.  Neurological:     Mental Status: He is alert and oriented to person, place, and time.     Coordination: Coordination normal.  Psychiatric:        Attention and Perception: Attention and perception normal. He does not perceive auditory or visual hallucinations.        Mood and Affect: Mood normal. Mood is not anxious or depressed. Affect is not labile, blunt, angry or inappropriate.        Speech: Speech normal.        Behavior: Behavior normal.        Thought Content: Thought content normal. Thought content is not paranoid or delusional. Thought content does not include homicidal or suicidal ideation. Thought content does not include homicidal or suicidal plan.        Cognition and Memory: Cognition and memory normal.        Judgment: Judgment normal.     Comments: Insight intact     Lab Review:     Component Value  Date/Time   NA 140 05/16/2020 0912   K 3.6 05/16/2020 0912   CL 105 05/16/2020 0912   CO2 24 05/16/2020 0912   GLUCOSE 202 (H) 05/16/2020 0912   BUN 11 05/16/2020 0912   CREATININE 1.05 05/16/2020 0912   CALCIUM 9.6 05/16/2020 0912   PROT 7.5 05/16/2020 0912   ALBUMIN 4.3 05/16/2020 0912   AST 20 05/16/2020 0912   ALT 27 05/16/2020 0912   ALKPHOS 73 05/16/2020 0912   BILITOT 0.7 05/16/2020 0912   GFRNONAA >60 05/16/2020 0912   GFRAA >60 05/16/2020 0912       Component Value Date/Time   WBC 12.5 (H)  05/16/2020 0912   RBC 5.12 05/16/2020 0912   HGB 15.3 05/16/2020 0912   HCT 45.6 05/16/2020 0912   PLT 392 05/16/2020 0912   MCV 89.1 05/16/2020 0912   MCH 29.9 05/16/2020 0912   MCHC 33.6 05/16/2020 0912   RDW 12.7 05/16/2020 0912   LYMPHSABS 2.4 04/19/2020 1252   MONOABS 0.8 04/19/2020 1252   EOSABS 0.0 04/19/2020 1252   BASOSABS 0.1 04/19/2020 1252    No results found for: POCLITH, LITHIUM   No results found for: PHENYTOIN, PHENOBARB, VALPROATE, CBMZ   .res Assessment: Plan:    Plan:  1. Perphenazine 6mg  BID (4mg  and 2mg  scripts) 2. Wellbutrin XL 300mg  daily 3. Lamictal 200mg  hs 4. Xanax 0.5mg  prn - not taking  5. Trazadone - not taking  RTC 4 weeks  Patient advised to contact office with any questions, adverse effects, or acute worsening in signs and symptoms.  Discussed potential metabolic side effects associated with atypical antipsychotics, as well as potential risk for movement side effects. Advised pt to contact office if movement side effects occur.   Discussed potential benefits, risk, and side effects of benzodiazepines to include potential risk of tolerance and dependence, as well as possible drowsiness.  Advised patient not to drive if experiencing drowsiness and to take lowest possible effective dose to minimize risk of dependence and tolerance.   Diagnoses and all orders for this visit:  Major depressive disorder, recurrent episode, moderate  (HCC) -     buPROPion (WELLBUTRIN XL) 300 MG 24 hr tablet; Take 1 tablet (300 mg total) by mouth daily.  Insomnia, unspecified type  GAD (generalized anxiety disorder)  ADHD, predominantly inattentive type  Bipolar I disorder (HCC) -     perphenazine (TRILAFON) 2 MG tablet; Take 1 tablet (2 mg) by mouth with a 4 mg tablet twice a day (total of 6 mg bid) -     perphenazine (TRILAFON) 4 MG tablet; Take 1 tablet (4 mg) by mouth with a 2 mg tablet twice a day. (6 mg bid)  Other orders -     lamoTRIgine (LAMICTAL) 150 MG tablet; Take 1 tablet (150 mg total) by mouth at bedtime.     Please see After Visit Summary for patient specific instructions.  Future Appointments  Date Time Provider Department Center  12/04/2020 10:00 AM , [facility] None  12/18/2020 10:00 AM , [facility] None  01/01/2021 10:00 AM [facility], [facility] None  01/15/2021 10:00 AM [facility], [facility] CP-CP None    No orders of the defined types were placed in this encounter.   -------------------------------

## 2020-11-20 NOTE — Progress Notes (Signed)
[facility] Note  Name: Adam Larsen. Date: 11/13/20 MRN: [id number]   DOB: Oct 13, 1986 PCP: Patient, No Pcp Per  Time spent: 52 minutes  Treatment:  Individual therapy  Mental Status Exam:   Appearance:   Casual     Behavior:  Appropriate  Motor:  Normal  Speech/Language:   Clear and Coherent  Affect:  full range  Mood:  depressed and pleasant  Thought process:  normal  Thought content:    WNL  Sensory/Perceptual disturbances:    none  Orientation:  x4  Attention:  Good  Concentration:  Good  Memory:  WNL  Fund of knowledge:   Good  Insight:    Good  Judgment:   Good  Impulse Control:  Good   Reported Symptoms:  Sleep problems-wakes in the middle of the night, low motivation, isolative, intermittent depressed mood, anxiety, struggles with daily hygiene (not showering, brushing teeth etc), self depreciating thoughts  Risk Assessment: Danger to Self:  No Self-injurious Behavior: No Danger to Others: No Duty to Warn:no Physical Aggression / Violence:No  Access to Firearms a concern: No  Gang Involvement:No  Patient / guardian was educated about steps to take if suicide or homicide risk level increases between visits: yes While future psychiatric events cannot be accurately predicted, the patient does not currently require acute inpatient psychiatric care and does not currently meet [facility] involuntary commitment criteria.    Medications: Current Outpatient Medications  Medication Sig Dispense Refill  . Ascorbic Acid (VITAMIN C) 1000 MG tablet Take 2,000 mg by mouth daily.    Marland Kitchen buPROPion (WELLBUTRIN XL) 300 MG 24 hr tablet Take 1 tablet (300 mg total) by mouth daily. 30 tablet 2  . cholecalciferol (VITAMIN D3) 25 MCG (1000 UNIT) tablet Take 2,000 Units by mouth daily.    Marland Kitchen lamoTRIgine (LAMICTAL) 150 MG tablet Take 1 tablet (150 mg total) by mouth at bedtime. 30 tablet 2  . loratadine (CLARITIN) 10 MG tablet Take 1 tablet (10 mg total) by mouth  daily. 30 tablet 0  . Multiple Vitamin (MULTIVITAMIN) capsule Take 1 capsule by mouth daily.    Marland Kitchen omega-3 acid ethyl esters (LOVAZA) 1 g capsule Take 1 capsule (1 g total) by mouth 2 (two) times daily. 60 capsule 0  . Omega-3 Fatty Acids (FISH OIL) 1000 MG CAPS Take 1,000 mg by mouth daily.    Marland Kitchen perphenazine (TRILAFON) 2 MG tablet Take 1 tablet (2 mg) by mouth with a 4 mg tablet twice a day (total of 6 mg bid) 60 tablet 2  . perphenazine (TRILAFON) 4 MG tablet Take 1 tablet (4 mg) by mouth with a 2 mg tablet twice a day. (6 mg bid) 60 tablet 2  . traZODone (DESYREL) 50 MG tablet Take one to two tablets at bedtime for sleep. 60 tablet 2   No current facility-administered medications for this visit.    Subjective:   Patient arrived on time for today's session.  Assessed progress.  He stated that he is continuing to take his medications, may not take them at times for a period of maybe one or 2 days.  He stated he does this to "not feel sedated".  He shared his long history of mental health, taking medications for most of those years.  He stated his sleep schedule is been inconsistent, continues to express not wanting to find employment feeling he needs to "work on myself", referring to his mental health.  He identified how he did feel more for filled by  having some social outlets, spending time with friends sharing some social experiences.  Currently, he has only one close friend as he stated he had to draw boundaries with three others over the last several weeks.  Assessed more about his family relationships as he continues to live at home with his parents.  He stated he has a brother who is 2 years younger who also lives at home.  He stated that their mother "gives [place] grief" referring to continuing to live there "I know she is disappointed in me".  He stated he is closest to his mother though and they can have meaningful discussions.  Facilitated his identifying needs where he stated that he has some  ideas about creating income through self-employment, although he stated he did not want to disclose more details currently.  He verbalizes not wanting to return to a work situation where he has a boss he does not like which was the case for many years when he worked in [employer].  Encouraged him to identify life needs between sessions for further discussion next visit.  Interventions: CBT, supportive therapy  Diagnoses:    ICD-10-CM   1. Major depressive disorder, recurrent episode, moderate (HCC)  F33.1     Plan: Patient is to use CBT, mindfulness and coping skills to help manage decrease symptoms associated with their diagnosis.    Patient to set boundaries in his current relationship with a friend with which he identified trust issues.   Long-term goal:   Reduce overall level, frequency, and intensity of the feelings of depression and anxiety 7-8/10 to a 0-2/10 in severity for at least 3 consecutive months.   Short-term goal:  Decrease "deflating, self-doubting" and catastrophizing thinking style Decrease anxiety producing self talk such as thinking of the worse possible life outcome Decrease ruminating, which can affect his sleep and increased emotional distress    Assessment of progress:  progressing  Adam Larsen Session, [facility]

## 2020-12-04 ENCOUNTER — Other Ambulatory Visit: Payer: Self-pay

## 2020-12-04 ENCOUNTER — Ambulatory Visit (INDEPENDENT_AMBULATORY_CARE_PROVIDER_SITE_OTHER): Payer: 59 | Admitting: Mental Health

## 2020-12-04 DIAGNOSIS — F331 Major depressive disorder, recurrent, moderate: Secondary | ICD-10-CM

## 2020-12-04 NOTE — Progress Notes (Addendum)
[facility] Note  Name: Adam Larsen. Date: 12/04/20 MRN: [id number]   DOB: 1986-07-04 PCP: Patient, No Pcp Per  Time spent: 52 minutes  Treatment:  Individual therapy  Mental Status Exam:   Appearance:   Casual     Behavior:  Appropriate  Motor:  Normal  Speech/Language:   Clear and Coherent  Affect:  full range  Mood:  depressed and pleasant  Thought process:  normal  Thought content:    WNL  Sensory/Perceptual disturbances:    none  Orientation:  x4  Attention:  Good  Concentration:  Good  Memory:  WNL  Fund of knowledge:   Good  Insight:    Good  Judgment:   Good  Impulse Control:  Good   Reported Symptoms:  Sleep problems-wakes in the middle of the night, low motivation, isolative, intermittent depressed mood, anxiety, struggles with daily hygiene (not showering, brushing teeth etc), self depreciating thoughts  Risk Assessment: Danger to Self:  No Self-injurious Behavior: No Danger to Others: No Duty to Warn:no Physical Aggression / Violence:No  Access to Firearms a concern: No  Gang Involvement:No  Patient / guardian was educated about steps to take if suicide or homicide risk level increases between visits: yes While future psychiatric events cannot be accurately predicted, the patient does not currently require acute inpatient psychiatric care and does not currently meet [facility] involuntary commitment criteria.    Medications: Current Outpatient Medications  Medication Sig Dispense Refill  . Ascorbic Acid (VITAMIN C) 1000 MG tablet Take 2,000 mg by mouth daily.    Marland Kitchen buPROPion (WELLBUTRIN XL) 300 MG 24 hr tablet Take 1 tablet (300 mg total) by mouth daily. 30 tablet 2  . cholecalciferol (VITAMIN D3) 25 MCG (1000 UNIT) tablet Take 2,000 Units by mouth daily.    Marland Kitchen lamoTRIgine (LAMICTAL) 150 MG tablet Take 1 tablet (150 mg total) by mouth at bedtime. 30 tablet 2  . loratadine (CLARITIN) 10 MG tablet Take 1 tablet (10 mg total) by mouth  daily. 30 tablet 0  . Multiple Vitamin (MULTIVITAMIN) capsule Take 1 capsule by mouth daily.    Marland Kitchen omega-3 acid ethyl esters (LOVAZA) 1 g capsule Take 1 capsule (1 g total) by mouth 2 (two) times daily. 60 capsule 0  . Omega-3 Fatty Acids (FISH OIL) 1000 MG CAPS Take 1,000 mg by mouth daily.    Marland Kitchen perphenazine (TRILAFON) 2 MG tablet Take 1 tablet (2 mg) by mouth with a 4 mg tablet twice a day (total of 6 mg bid) 60 tablet 2  . perphenazine (TRILAFON) 4 MG tablet Take 1 tablet (4 mg) by mouth with a 2 mg tablet twice a day. (6 mg bid) 60 tablet 2  . traZODone (DESYREL) 50 MG tablet Take one to two tablets at bedtime for sleep. 60 tablet 2   No current facility-administered medications for this visit.    Subjective:   Interventions: CBT, supportive therapy  Diagnoses:    ICD-10-CM   1. Major depressive disorder, recurrent episode, moderate (HCC)  F33.1          Assessment of progress:  progressing  [name] Session, [facility]

## 2020-12-18 ENCOUNTER — Other Ambulatory Visit: Payer: Self-pay

## 2020-12-18 ENCOUNTER — Ambulatory Visit (INDEPENDENT_AMBULATORY_CARE_PROVIDER_SITE_OTHER): Payer: 59 | Admitting: Mental Health

## 2020-12-18 DIAGNOSIS — F331 Major depressive disorder, recurrent, moderate: Secondary | ICD-10-CM

## 2020-12-18 NOTE — Progress Notes (Signed)
[facility] Note  Name: Adam Larsen. Date: 12/18/20 MRN: [id number]   DOB: 1986-02-04 PCP: Patient, No Pcp Per  Time spent: 51 minutes  Treatment:  Individual therapy  Mental Status Exam:   Appearance:   Casual     Behavior:  Appropriate  Motor:  Normal  Speech/Language:   Clear and Coherent  Affect:  full range  Mood:  depressed and pleasant  Thought process:  normal  Thought content:    WNL  Sensory/Perceptual disturbances:    none  Orientation:  x4  Attention:  Good  Concentration:  Good  Memory:  WNL  Fund of knowledge:   Good  Insight:    Good  Judgment:   Good  Impulse Control:  Good   Reported Symptoms:  Sleep problems-wakes in the middle of the night, low motivation, isolative, intermittent depressed mood, anxiety, struggles with daily hygiene (not showering, brushing teeth etc), self depreciating thoughts  Risk Assessment: Danger to Self:  No Self-injurious Behavior: No Danger to Others: No Duty to Warn:no Physical Aggression / Violence:No  Access to Firearms a concern: No  Gang Involvement:No  Patient / guardian was educated about steps to take if suicide or homicide risk level increases between visits: yes While future psychiatric events cannot be accurately predicted, the patient does not currently require acute inpatient psychiatric care and does not currently meet [facility] involuntary commitment criteria.    Medications: Current Outpatient Medications  Medication Sig Dispense Refill  . Ascorbic Acid (VITAMIN C) 1000 MG tablet Take 2,000 mg by mouth daily.    Marland Kitchen buPROPion (WELLBUTRIN XL) 300 MG 24 hr tablet Take 1 tablet (300 mg total) by mouth daily. 30 tablet 2  . cholecalciferol (VITAMIN D3) 25 MCG (1000 UNIT) tablet Take 2,000 Units by mouth daily.    Marland Kitchen lamoTRIgine (LAMICTAL) 150 MG tablet Take 1 tablet (150 mg total) by mouth at bedtime. 30 tablet 2  . loratadine (CLARITIN) 10 MG tablet Take 1 tablet (10 mg total) by mouth  daily. 30 tablet 0  . Multiple Vitamin (MULTIVITAMIN) capsule Take 1 capsule by mouth daily.    Marland Kitchen omega-3 acid ethyl esters (LOVAZA) 1 g capsule Take 1 capsule (1 g total) by mouth 2 (two) times daily. 60 capsule 0  . Omega-3 Fatty Acids (FISH OIL) 1000 MG CAPS Take 1,000 mg by mouth daily.    Marland Kitchen perphenazine (TRILAFON) 2 MG tablet Take 1 tablet (2 mg) by mouth with a 4 mg tablet twice a day (total of 6 mg bid) 60 tablet 2  . perphenazine (TRILAFON) 4 MG tablet Take 1 tablet (4 mg) by mouth with a 2 mg tablet twice a day. (6 mg bid) 60 tablet 2  . traZODone (DESYREL) 50 MG tablet Take one to two tablets at bedtime for sleep. 60 tablet 2   No current facility-administered medications for this visit.    Subjective: Patient shared progress, how he recently took steps to identify a potential jobs and is applying. He said that he continues to live with his parents and how his mother has encouraged him to find a job that would not be at home however, patient is looking for opportunities with which he can work from home. He considers himself an introvert and feels this would be a better initial step for him as he would return to work. Facilitate his identifying needs where he processed thoughts and feelings related to gaining of sense of independence, possibly moving out of his parents house. Encourage him  to consider between sessions what changes he feels would occur if he were to take steps toward obtaining a job and eventually moving out and being on the zone. Patient shared how he'd like to be in a relationship again however, no time soon. Facilitated patient identifying thoughts toward identifying needs providing support throughout.  Interventions: CBT, supportive therapy  Diagnoses:    ICD-10-CM   1. Major depressive disorder, recurrent episode, moderate (HCC)  F33.1          Assessment of progress:  progressing  [name] Session, [facility]

## 2020-12-19 ENCOUNTER — Ambulatory Visit (INDEPENDENT_AMBULATORY_CARE_PROVIDER_SITE_OTHER): Payer: 59 | Admitting: Adult Health

## 2020-12-19 ENCOUNTER — Encounter: Payer: Self-pay | Admitting: Adult Health

## 2020-12-19 DIAGNOSIS — F333 Major depressive disorder, recurrent, severe with psychotic symptoms: Secondary | ICD-10-CM | POA: Diagnosis not present

## 2020-12-19 DIAGNOSIS — F900 Attention-deficit hyperactivity disorder, predominantly inattentive type: Secondary | ICD-10-CM

## 2020-12-19 NOTE — Progress Notes (Signed)
Adam Larsen [id number] 05/12/1986 35 y.o.  Subjective:   Patient ID:  Adam Prieur. is a 35 y.o. (DOB [birth year]) male.  Chief Complaint: No chief complaint on file.   HPI Adam Luty. presents to the office today for follow-up of MDD, GAD, insomnia, BPD1, and ADHD.  Describes mood today as "ok". Pleasant. Mood symptoms - denies depression, anxiety, and irritability. Mood "level". Stating "I'm doing fine". Taking medications 2 days and then skips 1 day. Feels less sedated and has more "highs" off the medication. Stating "I just feel better". Seeing Adam Larsen for therapy. Varying interest and motivation. Looking on "indeed" for online jobs. Taking medications as prescribed.  Energy levels stable. Active, does not have a regular exercise routine.  Enjoys some usual interests and activities. Single. Lives with parents. Spending time with family.  Appetite adequate. Weight stable 200 pounds. Sleeps better at night. Averages 5 to 6 hours. Napping during the day - 1 to 2 hours.. Focus and concentration "fine". Starting tasks and not finishing them. Diagnosed with ADHD in childhood. Completing tasks. Managing aspects of household.  Denies SI or HI.  Denies AH or VH.   Previous medication trials: Leafy Kindle, Thorazine    PHQ2-9   Flowsheet Row ED from 05/16/2020 in [facility] EMERGENCY DEPARTMENT  PHQ-2 Total Score 6  PHQ-9 Total Score 17    Flowsheet Row ED from 04/19/2020 in [facility] EMERGENCY DEPARTMENT  C-SSRS RISK CATEGORY Error: Q2 is Yes, you must answer 3, 4, and 5       Review of Systems:  Review of Systems  Musculoskeletal: Negative for gait problem.  Neurological: Negative for tremors.  Psychiatric/Behavioral:       Please refer to HPI    Medications: I have reviewed the patient's current medications.  Current Outpatient Medications  Medication Sig Dispense Refill  . Ascorbic Acid (VITAMIN C) 1000 MG tablet Take  2,000 mg by mouth daily.    Marland Kitchen buPROPion (WELLBUTRIN XL) 300 MG 24 hr tablet Take 1 tablet (300 mg total) by mouth daily. 30 tablet 2  . cholecalciferol (VITAMIN D3) 25 MCG (1000 UNIT) tablet Take 2,000 Units by mouth daily.    Marland Kitchen lamoTRIgine (LAMICTAL) 150 MG tablet Take 1 tablet (150 mg total) by mouth at bedtime. 30 tablet 2  . loratadine (CLARITIN) 10 MG tablet Take 1 tablet (10 mg total) by mouth daily. 30 tablet 0  . Multiple Vitamin (MULTIVITAMIN) capsule Take 1 capsule by mouth daily.    Marland Kitchen omega-3 acid ethyl esters (LOVAZA) 1 g capsule Take 1 capsule (1 g total) by mouth 2 (two) times daily. 60 capsule 0  . Omega-3 Fatty Acids (FISH OIL) 1000 MG CAPS Take 1,000 mg by mouth daily.    Marland Kitchen perphenazine (TRILAFON) 2 MG tablet Take 1 tablet (2 mg) by mouth with a 4 mg tablet twice a day (total of 6 mg bid) 60 tablet 2  . perphenazine (TRILAFON) 4 MG tablet Take 1 tablet (4 mg) by mouth with a 2 mg tablet twice a day. (6 mg bid) 60 tablet 2  . traZODone (DESYREL) 50 MG tablet Take one to two tablets at bedtime for sleep. 60 tablet 2   No current facility-administered medications for this visit.    Medication Side Effects: None  Allergies: No Known Allergies  Past Medical History:  Diagnosis Date  . Anxiety   . Bipolar 1 disorder (HCC)   . Depression   . Mania (HCC)   .  Suicidal ideations     Family History  Problem Relation Age of Onset  . Schizophrenia Paternal Grandmother     Social History   Socioeconomic History  . Marital status: Single    Spouse name: Not on file  . Number of children: 0  . Years of education: 10 or 2  . Highest education level: 11th grade  Occupational History  . Not on file  Tobacco Use  . Smoking status: Current Every Day Smoker    Years: 10.00    Types: E-cigarettes  . Smokeless tobacco: Never Used  Vaping Use  . Vaping Use: Every day  . Substances: Nicotine, Flavoring, Nicotine-salt  Substance and Sexual Activity  . Alcohol use: Never  .  Drug use: Never  . Sexual activity: Not Currently  Other Topics Concern  . Not on file  Social History Narrative   Pt lives with his parents. States he has been diagnosed with ADHD, dyslexia, dysgraphia and learning disabilities. Finished 10 th or 11 th grade but is working on his GED through an online high school program. Pt is currently unemployed.   Social Determinants of Health   Financial Resource Strain: Not on file  Food Insecurity: Not on file  Transportation Needs: Not on file  Physical Activity: Not on file  Stress: Not on file  Social Connections: Not on file  Intimate Partner Violence: Not on file    Past Medical History, Surgical history, Social history, and Family history were reviewed and updated as appropriate.   Please see review of systems for further details on the patient's review from today.   Objective:   Physical Exam:  There were no vitals taken for this visit.  Physical Exam Constitutional:      General: He is not in acute distress. Musculoskeletal:        General: No deformity.  Neurological:     Mental Status: He is alert and oriented to person, place, and time.     Coordination: Coordination normal.  Psychiatric:        Attention and Perception: Attention and perception normal. He does not perceive auditory or visual hallucinations.        Mood and Affect: Mood normal. Mood is not anxious or depressed. Affect is not labile, blunt, angry or inappropriate.        Speech: Speech normal.        Behavior: Behavior normal.        Thought Content: Thought content normal. Thought content is not paranoid or delusional. Thought content does not include homicidal or suicidal ideation. Thought content does not include homicidal or suicidal plan.        Cognition and Memory: Cognition and memory normal.        Judgment: Judgment normal.     Comments: Insight intact     Lab Review:     Component Value Date/Time   NA 140 05/16/2020 0912   K 3.6  05/16/2020 0912   CL 105 05/16/2020 0912   CO2 24 05/16/2020 0912   GLUCOSE 202 (H) 05/16/2020 0912   BUN 11 05/16/2020 0912   CREATININE 1.05 05/16/2020 0912   CALCIUM 9.6 05/16/2020 0912   PROT 7.5 05/16/2020 0912   ALBUMIN 4.3 05/16/2020 0912   AST 20 05/16/2020 0912   ALT 27 05/16/2020 0912   ALKPHOS 73 05/16/2020 0912   BILITOT 0.7 05/16/2020 0912   GFRNONAA >60 05/16/2020 0912   GFRAA >60 05/16/2020 0912       Component Value  Date/Time   WBC 12.5 (H) 05/16/2020 0912   RBC 5.12 05/16/2020 0912   HGB 15.3 05/16/2020 0912   HCT 45.6 05/16/2020 0912   PLT 392 05/16/2020 0912   MCV 89.1 05/16/2020 0912   MCH 29.9 05/16/2020 0912   MCHC 33.6 05/16/2020 0912   RDW 12.7 05/16/2020 0912   LYMPHSABS 2.4 04/19/2020 1252   MONOABS 0.8 04/19/2020 1252   EOSABS 0.0 04/19/2020 1252   BASOSABS 0.1 04/19/2020 1252    No results found for: POCLITH, LITHIUM   No results found for: PHENYTOIN, PHENOBARB, VALPROATE, CBMZ   .res Assessment: Plan:    Plan:  1. Perphenazine 6mg  BID (4mg  and 2mg  scripts) 2. Wellbutrin XL 300mg  daily 3. Lamictal 200mg  hs 4. Xanax 0.5mg  prn - not taking  5. Trazadone - not taking  RTC 4 weeks  Patient advised to contact office with any questions, adverse effects, or acute worsening in signs and symptoms.  Discussed potential metabolic side effects associated with atypical antipsychotics, as well as potential risk for movement side effects. Advised pt to contact office if movement side effects occur.   Discussed potential benefits, risk, and side effects of benzodiazepines to include potential risk of tolerance and dependence, as well as possible drowsiness.  Advised patient not to drive if experiencing drowsiness and to take lowest possible effective dose to minimize risk of dependence and tolerance.   Diagnoses and all orders for this visit:  ADHD, predominantly inattentive type  Severe episode of recurrent major depressive disorder, with  psychotic features (HCC)     Please see After Visit Summary for patient specific instructions.  Future Appointments  Date Time Provider Department Center  01/01/2021 10:00 AM , [facility] CP-CP None  01/15/2021 10:00 AM , [facility] CP-CP None  01/16/2021  9:40 AM Atira Borello, [facility], NP CP-CP None    No orders of the defined types were placed in this encounter.   -------------------------------

## 2021-01-01 ENCOUNTER — Ambulatory Visit (INDEPENDENT_AMBULATORY_CARE_PROVIDER_SITE_OTHER): Payer: 59 | Admitting: Mental Health

## 2021-01-01 ENCOUNTER — Other Ambulatory Visit: Payer: Self-pay

## 2021-01-01 DIAGNOSIS — F331 Major depressive disorder, recurrent, moderate: Secondary | ICD-10-CM
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
## 2021-01-15 ENCOUNTER — Ambulatory Visit: Payer: 59 | Admitting: Mental Health

## 2021-01-16 ENCOUNTER — Ambulatory Visit (INDEPENDENT_AMBULATORY_CARE_PROVIDER_SITE_OTHER): Payer: 59 | Admitting: Adult Health

## 2021-01-16 ENCOUNTER — Encounter: Payer: Self-pay | Admitting: Adult Health

## 2021-01-16 ENCOUNTER — Other Ambulatory Visit: Payer: Self-pay

## 2021-01-16 DIAGNOSIS — F900 Attention-deficit hyperactivity disorder, predominantly inattentive type: Secondary | ICD-10-CM

## 2021-01-16 DIAGNOSIS — F333 Major depressive disorder, recurrent, severe with psychotic symptoms: Secondary | ICD-10-CM | POA: Diagnosis not present

## 2021-01-16 DIAGNOSIS — F411 Generalized anxiety disorder: Secondary | ICD-10-CM

## 2021-01-16 DIAGNOSIS — G4700 Insomnia, unspecified: Secondary | ICD-10-CM | POA: Diagnosis not present

## 2021-01-16 DIAGNOSIS — F319 Bipolar disorder, unspecified: Secondary | ICD-10-CM

## 2021-01-16 DIAGNOSIS — F331 Major depressive disorder, recurrent, moderate: Secondary | ICD-10-CM

## 2021-01-16 MED ORDER — PERPHENAZINE 2 MG PO TABS: ORAL_TABLET | ORAL | 2 refills | Status: DC

## 2021-01-16 MED ORDER — PERPHENAZINE 4 MG PO TABS
ORAL_TABLET | ORAL | 2 refills | Status: DC
Start: 2021-01-16 — End: 2021-05-15

## 2021-01-16 MED ORDER — BUPROPION HCL ER (XL) 300 MG PO TB24: 300.0000 mg | ORAL_TABLET | Freq: Every day | ORAL | 2 refills | Status: DC

## 2021-01-16 MED ORDER — LAMOTRIGINE 150 MG PO TABS: 150.0000 mg | ORAL_TABLET | Freq: Every day | ORAL | 2 refills | Status: DC

## 2021-01-16 NOTE — Progress Notes (Signed)
Adam Larsen [id number] February 06, 1986 35 y.o.  Subjective:   Patient ID:  Adam Larsen. is a 34 y.o. (DOB 1986/06/17) male.  Chief Complaint: No chief complaint on file.   HPI Adam Larsen. presents to the office today for follow-up of MDD, GAD, insomnia, BPD1, and ADHD.  Describes mood today as "ok". Pleasant. Mood symptoms - denies depression, anxiety, and irritability. Mood remains "nuetral".  Stating "I feel like I'm doing alright". Taking medications 2 days and then takes 1 day off. Stating "I look forward to the 3rd day. Also stating "I have more enjoyment that day. Seeing [name] for therapy. Improved interest and motivation. Taking medications as prescribed.  Energy levels stable. Active, has a regular exercise routine - walking and or running daily.  Enjoys some usual interests and activities. Single. Lives with parents. Spending time with family.  Appetite adequate. Weight loss 3 pounds - 197 pounds. Sleeping better at night. Averages 5 to 6 hours - "broken sleep". Napping during the day - 1 hours. Focus and concentration stable. Starting tasks and not finishing them. Diagnosed with ADHD in childhood. Completing tasks around the house. Managing aspects of household.  Denies SI or HI.  Denies AH or VH.   PHQ2-9   Flowsheet Row ED from 05/16/2020 in [facility] EMERGENCY DEPARTMENT  PHQ-2 Total Score 6  PHQ-9 Total Score 17    Flowsheet Row ED from 04/19/2020 in [facility] EMERGENCY DEPARTMENT  C-SSRS RISK CATEGORY Error: Q2 is Yes, you must answer 3, 4, and 5       Review of Systems:  Review of Systems  Musculoskeletal: Negative for gait problem.  Neurological: Negative for tremors.  Psychiatric/Behavioral:       Please refer to HPI    Medications: I have reviewed the patient's current medications.  Current Outpatient Medications  Medication Sig Dispense Refill  . Ascorbic Acid (VITAMIN C) 1000 MG tablet Take  2,000 mg by mouth daily.    Marland Kitchen buPROPion (WELLBUTRIN XL) 300 MG 24 hr tablet Take 1 tablet (300 mg total) by mouth daily. 30 tablet 2  . cholecalciferol (VITAMIN D3) 25 MCG (1000 UNIT) tablet Take 2,000 Units by mouth daily.    Marland Kitchen lamoTRIgine (LAMICTAL) 150 MG tablet Take 1 tablet (150 mg total) by mouth at bedtime. 30 tablet 2  . loratadine (CLARITIN) 10 MG tablet Take 1 tablet (10 mg total) by mouth daily. 30 tablet 0  . Multiple Vitamin (MULTIVITAMIN) capsule Take 1 capsule by mouth daily.    Marland Kitchen omega-3 acid ethyl esters (LOVAZA) 1 g capsule Take 1 capsule (1 g total) by mouth 2 (two) times daily. 60 capsule 0  . Omega-3 Fatty Acids (FISH OIL) 1000 MG CAPS Take 1,000 mg by mouth daily.    Marland Kitchen perphenazine (TRILAFON) 2 MG tablet Take 1 tablet (2 mg) by mouth with a 4 mg tablet twice a day (total of 6 mg bid) 60 tablet 2  . perphenazine (TRILAFON) 4 MG tablet Take 1 tablet (4 mg) by mouth with a 2 mg tablet twice a day. (6 mg bid) 60 tablet 2   No current facility-administered medications for this visit.    Medication Side Effects: None  Allergies: No Known Allergies  Past Medical History:  Diagnosis Date  . Anxiety   . Bipolar 1 disorder (HCC)   . Depression   . Mania (HCC)   . Suicidal ideations     Family History  Problem Relation Age of Onset  .  Schizophrenia Paternal Grandmother     Social History   Socioeconomic History  . Marital status: Single    Spouse name: Not on file  . Number of children: 0  . Years of education: 10 or 91  . Highest education level: 11th grade  Occupational History  . Not on file  Tobacco Use  . Smoking status: Current Every Day Smoker    Years: 10.00    Types: E-cigarettes  . Smokeless tobacco: Never Used  Vaping Use  . Vaping Use: Every day  . Substances: Nicotine, Flavoring, Nicotine-salt  Substance and Sexual Activity  . Alcohol use: Never  . Drug use: Never  . Sexual activity: Not Currently  Other Topics Concern  . Not on file   Social History Narrative   Pt lives with his parents. States he has been diagnosed with ADHD, dyslexia, dysgraphia and learning disabilities. Finished 10 th or 11 th grade but is working on his GED through an online high school program. Pt is currently unemployed.   Social Determinants of Health   Financial Resource Strain: Not on file  Food Insecurity: Not on file  Transportation Needs: Not on file  Physical Activity: Not on file  Stress: Not on file  Social Connections: Not on file  Intimate Partner Violence: Not on file    Past Medical History, Surgical history, Social history, and Family history were reviewed and updated as appropriate.   Please see review of systems for further details on the patient's review from today.   Objective:   Physical Exam:  There were no vitals taken for this visit.  Physical Exam Constitutional:      General: He is not in acute distress. Musculoskeletal:        General: No deformity.  Neurological:     Mental Status: He is alert and oriented to person, place, and time.     Coordination: Coordination normal.  Psychiatric:        Attention and Perception: Attention and perception normal. He does not perceive auditory or visual hallucinations.        Mood and Affect: Mood normal. Mood is not anxious or depressed. Affect is not labile, blunt, angry or inappropriate.        Speech: Speech normal.        Behavior: Behavior normal.        Thought Content: Thought content normal. Thought content is not paranoid or delusional. Thought content does not include homicidal or suicidal ideation. Thought content does not include homicidal or suicidal plan.        Cognition and Memory: Cognition and memory normal.        Judgment: Judgment normal.     Comments: Insight intact     Lab Review:     Component Value Date/Time   NA 140 05/16/2020 0912   K 3.6 05/16/2020 0912   CL 105 05/16/2020 0912   CO2 24 05/16/2020 0912   GLUCOSE 202 (H) 05/16/2020  0912   BUN 11 05/16/2020 0912   CREATININE 1.05 05/16/2020 0912   CALCIUM 9.6 05/16/2020 0912   PROT 7.5 05/16/2020 0912   ALBUMIN 4.3 05/16/2020 0912   AST 20 05/16/2020 0912   ALT 27 05/16/2020 0912   ALKPHOS 73 05/16/2020 0912   BILITOT 0.7 05/16/2020 0912   GFRNONAA >60 05/16/2020 0912   GFRAA >60 05/16/2020 0912       Component Value Date/Time   WBC 12.5 (H) 05/16/2020 0912   RBC 5.12 05/16/2020 0912  HGB 15.3 05/16/2020 0912   HCT 45.6 05/16/2020 0912   PLT 392 05/16/2020 0912   MCV 89.1 05/16/2020 0912   MCH 29.9 05/16/2020 0912   MCHC 33.6 05/16/2020 0912   RDW 12.7 05/16/2020 0912   LYMPHSABS 2.4 04/19/2020 1252   MONOABS 0.8 04/19/2020 1252   EOSABS 0.0 04/19/2020 1252   BASOSABS 0.1 04/19/2020 1252    No results found for: POCLITH, LITHIUM   No results found for: PHENYTOIN, PHENOBARB, VALPROATE, CBMZ   .res Assessment: Plan:    Plan:  1. Perphenazine 6mg  BID (4mg  and 2mg  scripts) 2. Wellbutrin XL 300mg  daily 3. Lamictal 200mg  hs 4. Xanax 0.5mg  prn - not taking  5. Trazadone - not taking  RTC 4 weeks  Patient advised to contact office with any questions, adverse effects, or acute worsening in signs and symptoms.  Discussed potential metabolic side effects associated with atypical antipsychotics, as well as potential risk for movement side effects. Advised pt to contact office if movement side effects occur.   Discussed potential benefits, risk, and side effects of benzodiazepines to include potential risk of tolerance and dependence, as well as possible drowsiness.  Advised patient not to drive if experiencing drowsiness and to take lowest possible effective dose to minimize risk of dependence and tolerance.  Diagnoses and all orders for this visit:  ADHD, predominantly inattentive type  Severe episode of recurrent major depressive disorder, with psychotic features (HCC)  Insomnia, unspecified type  GAD (generalized anxiety disorder)  Bipolar  I disorder (HCC) -     perphenazine (TRILAFON) 2 MG tablet; Take 1 tablet (2 mg) by mouth with a 4 mg tablet twice a day (total of 6 mg bid) -     perphenazine (TRILAFON) 4 MG tablet; Take 1 tablet (4 mg) by mouth with a 2 mg tablet twice a day. (6 mg bid)  Major depressive disorder, recurrent episode, moderate (HCC) -     buPROPion (WELLBUTRIN XL) 300 MG 24 hr tablet; Take 1 tablet (300 mg total) by mouth daily.  Other orders -     lamoTRIgine (LAMICTAL) 150 MG tablet; Take 1 tablet (150 mg total) by mouth at bedtime.     Please see After Visit Summary for patient specific instructions.  Future Appointments  Date Time Provider Department Center  02/01/2021  9:00 AM , [facility] CP-CP None    No orders of the defined types were placed in this encounter.   -------------------------------

## 2021-02-01 ENCOUNTER — Ambulatory Visit (INDEPENDENT_AMBULATORY_CARE_PROVIDER_SITE_OTHER): Payer: 59 | Admitting: Mental Health

## 2021-02-01 ENCOUNTER — Other Ambulatory Visit: Payer: Self-pay

## 2021-02-01 DIAGNOSIS — F333 Major depressive disorder, recurrent, severe with psychotic symptoms: Secondary | ICD-10-CM | POA: Diagnosis not present

## 2021-02-01 NOTE — Progress Notes (Signed)
[facility]  Name: Adam Larsen. Date: 02/01/21 MRN: [id number]   DOB: 08-05-1986 PCP: Patient, No Pcp Per (Inactive)  Time spent: 53 minutes  Treatment:  Individual therapy  Mental Status Exam:   Appearance:   Casual     Behavior:  Appropriate  Motor:  Normal  Speech/Language:   Clear and Coherent  Affect:  full range  Mood:  depressed and pleasant  Thought process:  normal  Thought content:    WNL  Sensory/Perceptual disturbances:    none  Orientation:  x4  Attention:  Good  Concentration:  Good  Memory:  WNL  Fund of knowledge:   Good  Insight:    Good  Judgment:   Good  Impulse Control:  Good   Reported Symptoms:  Sleep problems-wakes in the middle of the night, low motivation, isolative, intermittent depressed mood, anxiety, struggles with daily hygiene (not showering, brushing teeth etc), self depreciating thoughts  Risk Assessment: Danger to Self:  No Self-injurious Behavior: No Danger to Others: No Duty to Warn:no Physical Aggression / Violence:No  Access to Firearms a concern: No  Gang Involvement:No  Patient / guardian was educated about steps to take if suicide or homicide risk level increases between visits: yes While future psychiatric events cannot be accurately predicted, the patient does not currently require acute inpatient psychiatric care and does not currently meet [facility] involuntary commitment criteria.    Medications: Current Outpatient Medications  Medication Sig Dispense Refill  . Ascorbic Acid (VITAMIN C) 1000 MG tablet Take 2,000 mg by mouth daily.    Marland Kitchen buPROPion (WELLBUTRIN XL) 300 MG 24 hr tablet Take 1 tablet (300 mg total) by mouth daily. 30 tablet 2  . cholecalciferol (VITAMIN D3) 25 MCG (1000 UNIT) tablet Take 2,000 Units by mouth daily.    Marland Kitchen lamoTRIgine (LAMICTAL) 150 MG tablet Take 1 tablet (150 mg total) by mouth at bedtime. 30 tablet 2  . loratadine (CLARITIN) 10 MG tablet Take 1 tablet (10 mg total)  by mouth daily. 30 tablet 0  . Multiple Vitamin (MULTIVITAMIN) capsule Take 1 capsule by mouth daily.    Marland Kitchen omega-3 acid ethyl esters (LOVAZA) 1 g capsule Take 1 capsule (1 g total) by mouth 2 (two) times daily. 60 capsule 0  . Omega-3 Fatty Acids (FISH OIL) 1000 MG CAPS Take 1,000 mg by mouth daily.    Marland Kitchen perphenazine (TRILAFON) 2 MG tablet Take 1 tablet (2 mg) by mouth with a 4 mg tablet twice a day (total of 6 mg bid) 60 tablet 2  . perphenazine (TRILAFON) 4 MG tablet Take 1 tablet (4 mg) by mouth with a 2 mg tablet twice a day. (6 mg bid) 60 tablet 2   No current facility-administered medications for this visit.    Subjective:  patient shared progress, now he continues to look for employment. He went on to share family relationships. Plus you're with his mom they go for walks in his neighborhood at times. Shared how he is not as close with his father, that his father as a history of coping with depression. Facilitated his identifying needs where he ultimately wants Independence, not to live at home with his parents. Facilitated his identifying supportive thoughts toward meeting these needs where he identified how he feels he will feel like he's moving forward in his life. He plans to further seek opportunities for employment.    Interventions: CBT, supportive therapy  Diagnoses:    ICD-10-CM   1. Severe episode of recurrent  major depressive disorder, with psychotic features (HCC)  F33.3      Plan:Patient is to use CBT, mindfulness and coping skills to help manage decrease symptoms associated with their diagnosis.   Patient to look for employment to obtain independence.  Long-term goal:  Reduce overall level, frequency, and intensityof the feelings of depression and anxiety 7-8/10 to a 0-2/10 in severity for at least 3 consecutive months.  Short-term goal:  Decrease "deflating, self-doubting" and catastrophizing thinking style Decrease anxiety producing self talk such as thinking  of the worse possible life outcome Decrease ruminating, which can affect his sleep and increased emotional distress    Assessment of progress:  progressing  [name] Session, [facility]

## 2021-02-15 ENCOUNTER — Ambulatory Visit (INDEPENDENT_AMBULATORY_CARE_PROVIDER_SITE_OTHER): Payer: 59 | Admitting: Adult Health

## 2021-02-15 ENCOUNTER — Other Ambulatory Visit: Payer: Self-pay

## 2021-02-15 ENCOUNTER — Encounter: Payer: Self-pay | Admitting: Adult Health

## 2021-02-15 DIAGNOSIS — G4700 Insomnia, unspecified: Secondary | ICD-10-CM | POA: Diagnosis not present

## 2021-02-15 DIAGNOSIS — F22 Delusional disorders: Secondary | ICD-10-CM | POA: Diagnosis not present

## 2021-02-15 DIAGNOSIS — F331 Major depressive disorder, recurrent, moderate: Secondary | ICD-10-CM

## 2021-02-15 DIAGNOSIS — F411 Generalized anxiety disorder: Secondary | ICD-10-CM

## 2021-02-15 MED ORDER — PERPHENAZINE 8 MG PO TABS: 8.0000 mg | ORAL_TABLET | Freq: Two times a day (BID) | ORAL | 2 refills | Status: DC

## 2021-02-15 NOTE — Progress Notes (Signed)
Adam Larsen [id number] 06/30/1986 35 y.o.  Subjective:   Patient ID:  Adam Larsen. is a 35 y.o. (DOB 04/03/1986) male.  Chief Complaint: No chief complaint on file.   HPI   Adam Larsen. presents to the office today for follow-up of MDD, GAD, insomnia, BPD1, and ADHD.    Describes mood today as "not very good". Pleasant. Mood symptoms - denies depression, anxiety, and irritability. Mood level - denies any ups or downs. Stating "I feel very paranoid". Feels like people are plotting against him. Has started taking medications as prescribed for the past 2 weeks versus his previous schedule of 2 days on and 1 day off. Willing to increase Perphenazine dose to help stabilize symptoms. Parents are aware of change in mood. Seeing [name] for therapy. Improved interest and motivation. Taking medications as prescribed.  Energy levels stable. Active, has a regular exercise routine - walking and or running daily.  Enjoys some usual interests and activities. Single. Lives with parents. Spending time with family.  Appetite adequate. Weight gain 3 pounds - 197 to 200 pounds. Sleeps better some nights than others. Averages 5 to 6 hours - "broken sleep". Napping during the day - 1 hour. Focus and concentration stable. Diagnosed with ADHD in childhood. Completing tasks around the house. Managing aspects of household.  Denies SI or HI.  Denies AH or VH.  Increased paranoia over the past 2 weeks.    PHQ2-9   Flowsheet Row ED from 05/16/2020 in [facility] EMERGENCY DEPARTMENT  PHQ-2 Total Score 6  PHQ-9 Total Score 17    Flowsheet Row ED from 04/19/2020 in [facility] EMERGENCY DEPARTMENT  C-SSRS RISK CATEGORY Error: Q2 is Yes, you must answer 3, 4, and 5       Review of Systems:  Review of Systems  Musculoskeletal: Negative for gait problem.  Neurological: Negative for tremors.  Psychiatric/Behavioral:       Please refer to HPI     Medications: I have reviewed the patient's current medications.  Current Outpatient Medications  Medication Sig Dispense Refill  . perphenazine (TRILAFON) 8 MG tablet Take 1 tablet (8 mg total) by mouth 2 (two) times daily. 60 tablet 2  . Ascorbic Acid (VITAMIN C) 1000 MG tablet Take 2,000 mg by mouth daily.    Marland Kitchen buPROPion (WELLBUTRIN XL) 300 MG 24 hr tablet Take 1 tablet (300 mg total) by mouth daily. 30 tablet 2  . cholecalciferol (VITAMIN D3) 25 MCG (1000 UNIT) tablet Take 2,000 Units by mouth daily.    Marland Kitchen lamoTRIgine (LAMICTAL) 150 MG tablet Take 1 tablet (150 mg total) by mouth at bedtime. 30 tablet 2  . loratadine (CLARITIN) 10 MG tablet Take 1 tablet (10 mg total) by mouth daily. 30 tablet 0  . Multiple Vitamin (MULTIVITAMIN) capsule Take 1 capsule by mouth daily.    Marland Kitchen omega-3 acid ethyl esters (LOVAZA) 1 g capsule Take 1 capsule (1 g total) by mouth 2 (two) times daily. 60 capsule 0  . Omega-3 Fatty Acids (FISH OIL) 1000 MG CAPS Take 1,000 mg by mouth daily.    Marland Kitchen perphenazine (TRILAFON) 2 MG tablet Take 1 tablet (2 mg) by mouth with a 4 mg tablet twice a day (total of 6 mg bid) 60 tablet 2  . perphenazine (TRILAFON) 4 MG tablet Take 1 tablet (4 mg) by mouth with a 2 mg tablet twice a day. (6 mg bid) 60 tablet 2   No current facility-administered medications for  this visit.    Medication Side Effects: None  Allergies: No Known Allergies  Past Medical History:  Diagnosis Date  . Anxiety   . Bipolar 1 disorder (HCC)   . Depression   . Mania (HCC)   . Suicidal ideations     Past Medical History, Surgical history, Social history, and Family history were reviewed and updated as appropriate.   Please see review of systems for further details on the patient's review from today.   Objective:   Physical Exam:  There were no vitals taken for this visit.  Physical Exam Constitutional:      General: He is not in acute distress. Musculoskeletal:        General: No  deformity.  Neurological:     Mental Status: He is alert and oriented to person, place, and time.     Coordination: Coordination normal.  Psychiatric:        Attention and Perception: Attention and perception normal. He does not perceive auditory or visual hallucinations.        Mood and Affect: Mood normal. Mood is not anxious or depressed. Affect is not labile, blunt, angry or inappropriate.        Speech: Speech normal.        Behavior: Behavior normal.        Thought Content: Thought content is paranoid. Thought content is not delusional. Thought content does not include homicidal or suicidal ideation. Thought content does not include homicidal or suicidal plan.        Cognition and Memory: Cognition and memory normal.        Judgment: Judgment normal.     Comments: Insight intact     Lab Review:     Component Value Date/Time   NA 140 05/16/2020 0912   K 3.6 05/16/2020 0912   CL 105 05/16/2020 0912   CO2 24 05/16/2020 0912   GLUCOSE 202 (H) 05/16/2020 0912   BUN 11 05/16/2020 0912   CREATININE 1.05 05/16/2020 0912   CALCIUM 9.6 05/16/2020 0912   PROT 7.5 05/16/2020 0912   ALBUMIN 4.3 05/16/2020 0912   AST 20 05/16/2020 0912   ALT 27 05/16/2020 0912   ALKPHOS 73 05/16/2020 0912   BILITOT 0.7 05/16/2020 0912   GFRNONAA >60 05/16/2020 0912   GFRAA >60 05/16/2020 0912       Component Value Date/Time   WBC 12.5 (H) 05/16/2020 0912   RBC 5.12 05/16/2020 0912   HGB 15.3 05/16/2020 0912   HCT 45.6 05/16/2020 0912   PLT 392 05/16/2020 0912   MCV 89.1 05/16/2020 0912   MCH 29.9 05/16/2020 0912   MCHC 33.6 05/16/2020 0912   RDW 12.7 05/16/2020 0912   LYMPHSABS 2.4 04/19/2020 1252   MONOABS 0.8 04/19/2020 1252   EOSABS 0.0 04/19/2020 1252   BASOSABS 0.1 04/19/2020 1252    No results found for: POCLITH, LITHIUM   No results found for: PHENYTOIN, PHENOBARB, VALPROATE, CBMZ   .res Assessment: Plan:     Plan:  1. Perphenazine 6mg  BID to 8mg  BID for increased  paranoia  2. Wellbutrin XL 300mg  daily 3. Lamictal 200mg  hs 4. Xanax 0.5mg  prn - not taking  5. Trazadone - not taking  RTC 4 weeks  Patient advised to contact office with any questions, adverse effects, or acute worsening in signs and symptoms.  Discussed potential metabolic side effects associated with atypical antipsychotics, as well as potential risk for movement side effects. Advised pt to contact office if movement side effects occur.  Discussed potential benefits, risk, and side effects of benzodiazepines to include potential risk of tolerance and dependence, as well as possible drowsiness.  Advised patient not to drive if experiencing drowsiness and to take lowest possible effective dose to minimize risk of dependence and tolerance.   Diagnoses and all orders for this visit:  Paranoia (HCC) -     perphenazine (TRILAFON) 8 MG tablet; Take 1 tablet (8 mg total) by mouth 2 (two) times daily.  Major depressive disorder, recurrent episode, moderate (HCC)  GAD (generalized anxiety disorder)  Insomnia, unspecified type     Please see After Visit Summary for patient specific instructions.  Future Appointments  Date Time Provider Department Center  02/19/2021 10:00 AM [name] Session, [facility] CP-CP None  03/02/2021  8:20 AM Nekeya Briski, Thereasa Solo, NP CP-CP None  03/05/2021  9:00 AM [name] Session, [facility] CP-CP None    No orders of the defined types were placed in this encounter.   -------------------------------

## 2021-02-19 ENCOUNTER — Other Ambulatory Visit: Payer: Self-pay

## 2021-02-19 ENCOUNTER — Ambulatory Visit (INDEPENDENT_AMBULATORY_CARE_PROVIDER_SITE_OTHER): Payer: 59 | Admitting: Mental Health

## 2021-02-19 DIAGNOSIS — F331 Major depressive disorder, recurrent, moderate: Secondary | ICD-10-CM | POA: Diagnosis not present

## 2021-02-19 NOTE — Progress Notes (Signed)
[facility]  Name: Adam Larsen. Date: 02/19/21 MRN: [id number]   DOB: 05-Nov-1985 PCP: Patient, No Pcp Per (Inactive)  Time spent: 51 minutes  Treatment:  Individual therapy  Mental Status Exam:   Appearance:   Casual     Behavior:  Appropriate  Motor:  Normal  Speech/Language:   Clear and Coherent  Affect:  full range  Mood:  depressed and pleasant  Thought process:  normal  Thought content:    WNL  Sensory/Perceptual disturbances:    none  Orientation:  x4  Attention:  Good  Concentration:  Good  Memory:  WNL  Fund of knowledge:   Good  Insight:    Good  Judgment:   Good  Impulse Control:  Good   Reported Symptoms:  Sleep problems-wakes in the middle of the night, low motivation, isolative, intermittent depressed mood, anxiety, struggles with daily hygiene (not showering, brushing teeth etc), self depreciating thoughts  Risk Assessment: Danger to Self:  No Self-injurious Behavior: No Danger to Others: No Duty to Warn:no Physical Aggression / Violence:No  Access to Firearms a concern: No  Gang Involvement:No  Patient / guardian was educated about steps to take if suicide or homicide risk level increases between visits: yes While future psychiatric events cannot be accurately predicted, the patient does not currently require acute inpatient psychiatric care and does not currently meet [facility] involuntary commitment criteria.    Medications: Current Outpatient Medications  Medication Sig Dispense Refill  . Ascorbic Acid (VITAMIN C) 1000 MG tablet Take 2,000 mg by mouth daily.    Marland Kitchen buPROPion (WELLBUTRIN XL) 300 MG 24 hr tablet Take 1 tablet (300 mg total) by mouth daily. 30 tablet 2  . cholecalciferol (VITAMIN D3) 25 MCG (1000 UNIT) tablet Take 2,000 Units by mouth daily.    Marland Kitchen lamoTRIgine (LAMICTAL) 150 MG tablet Take 1 tablet (150 mg total) by mouth at bedtime. 30 tablet 2  . loratadine (CLARITIN) 10 MG tablet Take 1 tablet (10 mg total)  by mouth daily. 30 tablet 0  . Multiple Vitamin (MULTIVITAMIN) capsule Take 1 capsule by mouth daily.    Marland Kitchen omega-3 acid ethyl esters (LOVAZA) 1 g capsule Take 1 capsule (1 g total) by mouth 2 (two) times daily. 60 capsule 0  . Omega-3 Fatty Acids (FISH OIL) 1000 MG CAPS Take 1,000 mg by mouth daily.    Marland Kitchen perphenazine (TRILAFON) 2 MG tablet Take 1 tablet (2 mg) by mouth with a 4 mg tablet twice a day (total of 6 mg bid) 60 tablet 2  . perphenazine (TRILAFON) 4 MG tablet Take 1 tablet (4 mg) by mouth with a 2 mg tablet twice a day. (6 mg bid) 60 tablet 2  . perphenazine (TRILAFON) 8 MG tablet Take 1 tablet (8 mg total) by mouth 2 (two) times daily. 60 tablet 2   No current facility-administered medications for this visit.    Subjective: Patient presents for session on time, sharing progress.  He stated that he struggled with increased paranoia recently but has returned to taking his medication and his symptoms of paranoia have subsided.  He stated he attended his recent medication management appointment, and feels his current medication regimen is effective.  Reports coping with paranoia since his mid 49s, family history on maternal side is positive for schizophrenia, his grandmother specifically.  He verbalizes the importance of taking his medication consistently and plans to continue.  Facilitated his taking steps toward finding employment where he continues to look for possible  jobs to work from home.  States he feels this will be best for him due to his being an introvert.  He stated his brother is to move out of the house although he is unemployed, his parents have committed to paying some of his rent to help him start the process of moving out.  Patient stated his brother copes with depression and hopes that he take steps toward his own independence when he moves out which will be in about a month.  Through guided discovery, patient identifies the need for independence for himself.  Expresses doubt  about finding a life partner however, when presented with the idea he states he would like to find someone with which he could marry if that is possible;He appears to struggle with self-confidence in this facilitated his identifying thoughts toward taking steps toward changes he feels are possible for himself which are to start working out consistently and to continue to look for potential employment which he plans to do for so between sessions.   Interventions: CBT, supportive therapy, problem solving strategies  Diagnoses:    ICD-10-CM   1. Major depressive disorder, recurrent episode, moderate (HCC)  F33.1      Plan:Patient is to use CBT, mindfulness and coping skills to help manage decrease symptoms associated with their diagnosis.   Patient to look for employment to obtain independence.  Long-term goal:  Reduce overall level, frequency, and intensityof the feelings of depression and anxiety 7-8/10 to a 0-2/10 in severity for at least 3 consecutive months.  Short-term goal:  Decrease "deflating, self-doubting" and catastrophizing thinking style Decrease anxiety producing self talk such as thinking of the worse possible life outcome Decrease ruminating, which can affect his sleep and increased emotional distress Continue to take medications as prescribed Increased self-confidence by engaging in an exercise regimen, working out Increase steps toward independence by finding employment and eventually moving out of this parent's home    Assessment of progress:  progressing  [name] Session, [facility]

## 2021-03-02 ENCOUNTER — Encounter: Payer: Self-pay | Admitting: Adult Health

## 2021-03-02 ENCOUNTER — Other Ambulatory Visit: Payer: Self-pay

## 2021-03-02 ENCOUNTER — Ambulatory Visit (INDEPENDENT_AMBULATORY_CARE_PROVIDER_SITE_OTHER): Payer: 59 | Admitting: Adult Health

## 2021-03-02 DIAGNOSIS — F22 Delusional disorders: Secondary | ICD-10-CM | POA: Diagnosis not present

## 2021-03-02 DIAGNOSIS — F411 Generalized anxiety disorder: Secondary | ICD-10-CM | POA: Diagnosis not present

## 2021-03-02 DIAGNOSIS — G4700 Insomnia, unspecified: Secondary | ICD-10-CM | POA: Diagnosis not present

## 2021-03-02 DIAGNOSIS — F331 Major depressive disorder, recurrent, moderate: Secondary | ICD-10-CM

## 2021-03-02 NOTE — Progress Notes (Signed)
Adam Larsen [id number] [birth year] 35 y.o.  Subjective:   Patient ID:  Adam Larsen. is a 35 y.o. (DOB [birth year]) male.  Chief Complaint: No chief complaint on file.   HPI Adam Larsen. presents to the office today for follow-up of MDD, GAD, insomnia, paranoia, and ADHD.    Describes mood today as "better". Pleasant. Mood symptoms - denies depression, anxiety, and irritability. Mood level - "pretty flat lined". Stating "I'm feeling less paranoid, but it's still there". Continues to feel like people are plotting against him - not as bad - more manageable". Fels like increase in Perphenazine has been helpful - willing to increase dose. Seeing [name] for therapy. Improved interest and motivation. Taking medications as prescribed.  Energy levels stable. Active, exercising - "here and there". Enjoys some usual interests and activities. Single. Lives with parents. Spending time with family.  Appetite adequate. Weight stable - 200 pounds. Sleeps better some nights than others. Averages 5 to 6 hours - restarted Trazadone "sleep less broken". Napping during the day - 1 to 2 hours. Focus and concentration stable. Diagnosed with ADHD in childhood. Completing tasks around the house. Managing aspects of household.  Denies SI or HI.  Denies AH or VH.  Decreased paranoia over the past 2 weeks.    PHQ2-9   Flowsheet Row ED from 05/16/2020 in [facility] EMERGENCY DEPARTMENT  PHQ-2 Total Score 6  PHQ-9 Total Score 17    Flowsheet Row ED from 04/19/2020 in [facility] EMERGENCY DEPARTMENT  C-SSRS RISK CATEGORY Error: Q2 is Yes, you must answer 3, 4, and 5       Review of Systems:  Review of Systems  Musculoskeletal: Negative for gait problem.  Neurological: Negative for tremors.  Psychiatric/Behavioral:       Please refer to HPI    Medications: I have reviewed the patient's current medications.  Current Outpatient Medications   Medication Sig Dispense Refill  . Ascorbic Acid (VITAMIN C) 1000 MG tablet Take 2,000 mg by mouth daily.    Marland Kitchen buPROPion (WELLBUTRIN XL) 300 MG 24 hr tablet Take 1 tablet (300 mg total) by mouth daily. 30 tablet 2  . cholecalciferol (VITAMIN D3) 25 MCG (1000 UNIT) tablet Take 2,000 Units by mouth daily.    Marland Kitchen lamoTRIgine (LAMICTAL) 150 MG tablet Take 1 tablet (150 mg total) by mouth at bedtime. 30 tablet 2  . loratadine (CLARITIN) 10 MG tablet Take 1 tablet (10 mg total) by mouth daily. 30 tablet 0  . Multiple Vitamin (MULTIVITAMIN) capsule Take 1 capsule by mouth daily.    Marland Kitchen omega-3 acid ethyl esters (LOVAZA) 1 g capsule Take 1 capsule (1 g total) by mouth 2 (two) times daily. 60 capsule 0  . Omega-3 Fatty Acids (FISH OIL) 1000 MG CAPS Take 1,000 mg by mouth daily.    Marland Kitchen perphenazine (TRILAFON) 2 MG tablet Take 1 tablet (2 mg) by mouth with a 4 mg tablet twice a day (total of 6 mg bid) 60 tablet 2  . perphenazine (TRILAFON) 4 MG tablet Take 1 tablet (4 mg) by mouth with a 2 mg tablet twice a day. (6 mg bid) 60 tablet 2  . perphenazine (TRILAFON) 8 MG tablet Take 1 tablet (8 mg total) by mouth 2 (two) times daily. 60 tablet 2   No current facility-administered medications for this visit.    Medication Side Effects: None  Allergies: No Known Allergies  Past Medical History:  Diagnosis Date  . Anxiety   .  Bipolar 1 disorder (HCC)   . Depression   . Mania (HCC)   . Suicidal ideations     Past Medical History, Surgical history, Social history, and Family history were reviewed and updated as appropriate.   Please see review of systems for further details on the patient's review from today.   Objective:   Physical Exam:  There were no vitals taken for this visit.  Physical Exam Constitutional:      General: He is not in acute distress. Musculoskeletal:        General: No deformity.  Neurological:     Mental Status: He is alert and oriented to person, place, and time.      Coordination: Coordination normal.  Psychiatric:        Attention and Perception: Attention and perception normal. He does not perceive auditory or visual hallucinations.        Mood and Affect: Mood normal. Mood is not anxious or depressed. Affect is not labile, blunt, angry or inappropriate.        Speech: Speech normal.        Behavior: Behavior normal.        Thought Content: Thought content normal. Thought content is not paranoid or delusional. Thought content does not include homicidal or suicidal ideation. Thought content does not include homicidal or suicidal plan.        Cognition and Memory: Cognition and memory normal.        Judgment: Judgment normal.     Comments: Insight intact     Lab Review:     Component Value Date/Time   NA 140 05/16/2020 0912   K 3.6 05/16/2020 0912   CL 105 05/16/2020 0912   CO2 24 05/16/2020 0912   GLUCOSE 202 (H) 05/16/2020 0912   BUN 11 05/16/2020 0912   CREATININE 1.05 05/16/2020 0912   CALCIUM 9.6 05/16/2020 0912   PROT 7.5 05/16/2020 0912   ALBUMIN 4.3 05/16/2020 0912   AST 20 05/16/2020 0912   ALT 27 05/16/2020 0912   ALKPHOS 73 05/16/2020 0912   BILITOT 0.7 05/16/2020 0912   GFRNONAA >60 05/16/2020 0912   GFRAA >60 05/16/2020 0912       Component Value Date/Time   WBC 12.5 (H) 05/16/2020 0912   RBC 5.12 05/16/2020 0912   HGB 15.3 05/16/2020 0912   HCT 45.6 05/16/2020 0912   PLT 392 05/16/2020 0912   MCV 89.1 05/16/2020 0912   MCH 29.9 05/16/2020 0912   MCHC 33.6 05/16/2020 0912   RDW 12.7 05/16/2020 0912   LYMPHSABS 2.4 04/19/2020 1252   MONOABS 0.8 04/19/2020 1252   EOSABS 0.0 04/19/2020 1252   BASOSABS 0.1 04/19/2020 1252    No results found for: POCLITH, LITHIUM   No results found for: PHENYTOIN, PHENOBARB, VALPROATE, CBMZ   .res Assessment: Plan:     Plan:  1. Perphenazine 8mg  BID to 8/10 x 7, then 10/10 if for increased paranoia  2. Wellbutrin XL 300mg  daily 3. Lamictal 200mg  hs 4. Xanax 0.5mg  prn - not  taking  5. Trazadone - not taking  RTC 4 weeks  Patient advised to contact office with any questions, adverse effects, or acute worsening in signs and symptoms.  Discussed potential metabolic side effects associated with atypical antipsychotics, as well as potential risk for movement side effects. Advised pt to contact office if movement side effects occur.   Discussed potential benefits, risk, and side effects of benzodiazepines to include potential risk of tolerance and dependence, as well as  possible drowsiness.  Advised patient not to drive if experiencing drowsiness and to take lowest possible effective dose to minimize risk of dependence and tolerance.    Diagnoses and all orders for this visit:  Major depressive disorder, recurrent episode, moderate (HCC)  Paranoia (HCC)  GAD (generalized anxiety disorder)  Insomnia, unspecified type     Please see After Visit Summary for patient specific instructions.  Future Appointments  Date Time Provider Department Center  03/05/2021  9:00 AM [name] Session, [facility] CP-CP None  03/20/2021  8:20 AM Seanpaul Preece, Thereasa Solo, NP CP-CP None  03/21/2021  9:00 AM [name] Session, [facility] CP-CP None    No orders of the defined types were placed in this encounter.   -------------------------------

## 2021-03-05 ENCOUNTER — Other Ambulatory Visit: Payer: Self-pay

## 2021-03-05 ENCOUNTER — Ambulatory Visit (INDEPENDENT_AMBULATORY_CARE_PROVIDER_SITE_OTHER): Payer: 59 | Admitting: Mental Health

## 2021-03-05 DIAGNOSIS — F331 Major depressive disorder, recurrent, moderate: Secondary | ICD-10-CM | POA: Diagnosis not present

## 2021-03-05 NOTE — Progress Notes (Signed)
[facility]  Name: Adam Larsen. Date: 03/05/21 MRN: [id number]   DOB: 1986/04/27 PCP: Patient, No Pcp Per (Inactive)  Time spent: 51 minutes  Treatment:  Individual therapy  Mental Status Exam:   Appearance:   Casual     Behavior:  Appropriate  Motor:  Normal  Speech/Language:   Clear and Coherent  Affect:  full range  Mood:  depressed and pleasant  Thought process:  normal  Thought content:    WNL  Sensory/Perceptual disturbances:    none  Orientation:  x4  Attention:  Good  Concentration:  Good  Memory:  WNL  Fund of knowledge:   Good  Insight:    Good  Judgment:   Good  Impulse Control:  Good   Reported Symptoms:  Sleep problems-wakes in the middle of the night, low motivation, isolative, intermittent depressed mood, anxiety, struggles with daily hygiene (not showering, brushing teeth etc), self depreciating thoughts  Risk Assessment: Danger to Self:  No Self-injurious Behavior: No Danger to Others: No Duty to Warn:no Physical Aggression / Violence:No  Access to Firearms a concern: No  Gang Involvement:No  Patient / guardian was educated about steps to take if suicide or homicide risk level increases between visits: yes While future psychiatric events cannot be accurately predicted, the patient does not currently require acute inpatient psychiatric care and does not currently meet [facility] involuntary commitment criteria.    Medications: Current Outpatient Medications  Medication Sig Dispense Refill  . Ascorbic Acid (VITAMIN C) 1000 MG tablet Take 2,000 mg by mouth daily.    Marland Kitchen buPROPion (WELLBUTRIN XL) 300 MG 24 hr tablet Take 1 tablet (300 mg total) by mouth daily. 30 tablet 2  . cholecalciferol (VITAMIN D3) 25 MCG (1000 UNIT) tablet Take 2,000 Units by mouth daily.    Marland Kitchen lamoTRIgine (LAMICTAL) 150 MG tablet Take 1 tablet (150 mg total) by mouth at bedtime. 30 tablet 2  . loratadine (CLARITIN) 10 MG tablet Take 1 tablet (10 mg total)  by mouth daily. 30 tablet 0  . Multiple Vitamin (MULTIVITAMIN) capsule Take 1 capsule by mouth daily.    Marland Kitchen omega-3 acid ethyl esters (LOVAZA) 1 g capsule Take 1 capsule (1 g total) by mouth 2 (two) times daily. 60 capsule 0  . Omega-3 Fatty Acids (FISH OIL) 1000 MG CAPS Take 1,000 mg by mouth daily.    Marland Kitchen perphenazine (TRILAFON) 2 MG tablet Take 1 tablet (2 mg) by mouth with a 4 mg tablet twice a day (total of 6 mg bid) 60 tablet 2  . perphenazine (TRILAFON) 4 MG tablet Take 1 tablet (4 mg) by mouth with a 2 mg tablet twice a day. (6 mg bid) 60 tablet 2  . perphenazine (TRILAFON) 8 MG tablet Take 1 tablet (8 mg total) by mouth 2 (two) times daily. 60 tablet 2   No current facility-administered medications for this visit.    Subjective: Patient presents for session on time, sharing progress.  He stated his brother moved out of the house has planned.  He stated his parents want him to have more independence and find a job which is also something he is to do within the next 6 months.  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  He identified the need to continue to look for other job possibilities as he wants to take steps toward gaining his own independence.  He denies having experienced any paranoia  over the last few weeks, reports his medications appear to be effective and helpful.  Facilitated his identifying self supportive thoughts toward accomplishing this goal where he was able to identify "I feel like I can eventually do it" referring to finding a job that would be a good fit in his being able to move out of his parent's home.    Interventions: CBT, supportive therapy, problem solving strategies  Diagnoses:    ICD-10-CM   1. Major depressive disorder, recurrent episode, moderate (HCC)  F33.1      Plan:Patient is to use CBT, mindfulness and coping skills to help manage decrease symptoms associated with their  diagnosis.   Patient to look for employment to obtain independence.   Long-term goal:  Reduce overall level, frequency, and intensityof the feelings of depression and anxiety 7-8/10 to a 0-2/10 in severity for at least 3 consecutive months.  Short-term goal:  Decrease "deflating, self-doubting" and catastrophizing thinking style Decrease anxiety producing self talk such as thinking of the worse possible life outcome Decrease ruminating, which can affect his sleep and increased emotional distress Continue to take medications as prescribed Increased self-confidence by engaging in an exercise regimen, working out Increase steps toward independence by finding employment and eventually moving out of this parent's home    Assessment of progress:  progressing  [name] Session, [facility]

## 2021-03-20 ENCOUNTER — Other Ambulatory Visit: Payer: Self-pay

## 2021-03-20 ENCOUNTER — Encounter: Payer: Self-pay | Admitting: Adult Health

## 2021-03-20 ENCOUNTER — Ambulatory Visit (INDEPENDENT_AMBULATORY_CARE_PROVIDER_SITE_OTHER): Payer: 59 | Admitting: Adult Health

## 2021-03-20 DIAGNOSIS — G4700 Insomnia, unspecified: Secondary | ICD-10-CM

## 2021-03-20 DIAGNOSIS — F22 Delusional disorders: Secondary | ICD-10-CM

## 2021-03-20 DIAGNOSIS — F333 Major depressive disorder, recurrent, severe with psychotic symptoms: Secondary | ICD-10-CM | POA: Diagnosis not present

## 2021-03-20 DIAGNOSIS — F411 Generalized anxiety disorder: Secondary | ICD-10-CM

## 2021-03-20 NOTE — Progress Notes (Signed)
Adam Larsen [id number] 02-02-1986 35 y.o.  Subjective:   Patient ID:  Adam Larsen. is a 35 y.o. (DOB [birth year]) male.  Chief Complaint: No chief complaint on file.   HPI Adam Larsen. presents to the office today for follow-up of MDD, GAD, insomnia, paranoia, and ADHD.  Describes mood today as "better". Pleasant. Mood symptoms - denies depression, anxiety, and irritability. Mood is level. Stating "the paranoia is gone now". No longer feels like people are plotting against him - "that has stopped". Feels like Perphenazine is working well. Seeing Adam Larsen for therapy. Improved interest and motivation. Taking medications as prescribed.  Energy levels lower. Active, does not have a regular exercise routine.   Enjoys some usual interests and activities. Single. Lives with parents. Spending time with family.  Appetite adequate. Weight loss - 3 pounds - 197 from 200 pounds. Sleeps better some nights than others. Averages 5 to 6 hours with Trazadone. Napping in the morning and afternoon for 1 to 2 hours. Focus and concentration stable. Diagnosed with ADHD in childhood. Completing tasks around the house. Managing aspects of household.  Denies SI or HI.  Denies AH or VH.       PHQ2-9   Flowsheet Row ED from 05/16/2020 in [facility] EMERGENCY DEPARTMENT  PHQ-2 Total Score 6  PHQ-9 Total Score 17    Flowsheet Row ED from 04/19/2020 in [facility] EMERGENCY DEPARTMENT  C-SSRS RISK CATEGORY Error: Q2 is Yes, you must answer 3, 4, and 5       Review of Systems:  Review of Systems  Musculoskeletal: Negative for gait problem.  Neurological: Negative for tremors.  Psychiatric/Behavioral:       Please refer to HPI    Medications: I have reviewed the patient's current medications.  Current Outpatient Medications  Medication Sig Dispense Refill  . Ascorbic Acid (VITAMIN C) 1000 MG tablet Take 2,000 mg by mouth daily.    Marland Kitchen buPROPion  (WELLBUTRIN XL) 300 MG 24 hr tablet Take 1 tablet (300 mg total) by mouth daily. 30 tablet 2  . cholecalciferol (VITAMIN D3) 25 MCG (1000 UNIT) tablet Take 2,000 Units by mouth daily.    Marland Kitchen lamoTRIgine (LAMICTAL) 150 MG tablet Take 1 tablet (150 mg total) by mouth at bedtime. 30 tablet 2  . loratadine (CLARITIN) 10 MG tablet Take 1 tablet (10 mg total) by mouth daily. 30 tablet 0  . Multiple Vitamin (MULTIVITAMIN) capsule Take 1 capsule by mouth daily.    Marland Kitchen omega-3 acid ethyl esters (LOVAZA) 1 g capsule Take 1 capsule (1 g total) by mouth 2 (two) times daily. 60 capsule 0  . Omega-3 Fatty Acids (FISH OIL) 1000 MG CAPS Take 1,000 mg by mouth daily.    Marland Kitchen perphenazine (TRILAFON) 2 MG tablet Take 1 tablet (2 mg) by mouth with a 4 mg tablet twice a day (total of 6 mg bid) 60 tablet 2  . perphenazine (TRILAFON) 4 MG tablet Take 1 tablet (4 mg) by mouth with a 2 mg tablet twice a day. (6 mg bid) 60 tablet 2  . perphenazine (TRILAFON) 8 MG tablet Take 1 tablet (8 mg total) by mouth 2 (two) times daily. 60 tablet 2   No current facility-administered medications for this visit.    Medication Side Effects: None  Allergies: No Known Allergies  Past Medical History:  Diagnosis Date  . Anxiety   . Bipolar 1 disorder (HCC)   . Depression   . Mania (HCC)   .  Suicidal ideations     Past Medical History, Surgical history, Social history, and Family history were reviewed and updated as appropriate.   Please see review of systems for further details on the patient's review from today.   Objective:   Physical Exam:  There were no vitals taken for this visit.  Physical Exam Constitutional:      General: He is not in acute distress. Musculoskeletal:        General: No deformity.  Neurological:     Mental Status: He is alert and oriented to person, place, and time.     Coordination: Coordination normal.  Psychiatric:        Attention and Perception: Attention and perception normal. He does not  perceive auditory or visual hallucinations.        Mood and Affect: Mood normal. Mood is not anxious or depressed. Affect is not labile, blunt, angry or inappropriate.        Speech: Speech normal.        Behavior: Behavior normal.        Thought Content: Thought content normal. Thought content is not paranoid or delusional. Thought content does not include homicidal or suicidal ideation. Thought content does not include homicidal or suicidal plan.        Cognition and Memory: Cognition and memory normal.        Judgment: Judgment normal.     Comments: Insight intact     Lab Review:     Component Value Date/Time   NA 140 05/16/2020 0912   K 3.6 05/16/2020 0912   CL 105 05/16/2020 0912   CO2 24 05/16/2020 0912   GLUCOSE 202 (H) 05/16/2020 0912   BUN 11 05/16/2020 0912   CREATININE 1.05 05/16/2020 0912   CALCIUM 9.6 05/16/2020 0912   PROT 7.5 05/16/2020 0912   ALBUMIN 4.3 05/16/2020 0912   AST 20 05/16/2020 0912   ALT 27 05/16/2020 0912   ALKPHOS 73 05/16/2020 0912   BILITOT 0.7 05/16/2020 0912   GFRNONAA >60 05/16/2020 0912   GFRAA >60 05/16/2020 0912       Component Value Date/Time   WBC 12.5 (H) 05/16/2020 0912   RBC 5.12 05/16/2020 0912   HGB 15.3 05/16/2020 0912   HCT 45.6 05/16/2020 0912   PLT 392 05/16/2020 0912   MCV 89.1 05/16/2020 0912   MCH 29.9 05/16/2020 0912   MCHC 33.6 05/16/2020 0912   RDW 12.7 05/16/2020 0912   LYMPHSABS 2.4 04/19/2020 1252   MONOABS 0.8 04/19/2020 1252   EOSABS 0.0 04/19/2020 1252   BASOSABS 0.1 04/19/2020 1252    No results found for: POCLITH, LITHIUM   No results found for: PHENYTOIN, PHENOBARB, VALPROATE, CBMZ   .res Assessment: Plan:     Plan:  1. Perphenazine 10mg  BID for increased paranoia  2. Wellbutrin XL 300mg  daily 3. Lamictal 200mg  hs 4. Xanax 0.5mg  prn - not taking  5. Trazadone - not taking  RTC 4 weeks  Patient advised to contact office with any questions, adverse effects, or acute worsening in signs and  symptoms.  Discussed potential metabolic side effects associated with atypical antipsychotics, as well as potential risk for movement side effects. Advised pt to contact office if movement side effects occur.   Discussed potential benefits, risk, and side effects of benzodiazepines to include potential risk of tolerance and dependence, as well as possible drowsiness.  Advised patient not to drive if experiencing drowsiness and to take lowest possible effective dose to minimize risk of dependence  and tolerance.    Diagnoses and all orders for this visit:  Paranoia (HCC)  GAD (generalized anxiety disorder)  Insomnia, unspecified type  Severe episode of recurrent major depressive disorder, with psychotic features (HCC)     Please see After Visit Summary for patient specific instructions.  Future Appointments  Date Time Provider Department Center  04/04/2021  9:00 AM [name] Session, [facility] CP-CP None  04/17/2021  8:20 AM Lyriq Finerty, Thereasa Solo, NP CP-CP None    No orders of the defined types were placed in this encounter.   -------------------------------

## 2021-03-21 ENCOUNTER — Ambulatory Visit: Payer: 59 | Admitting: Mental Health

## 2021-04-04 ENCOUNTER — Other Ambulatory Visit: Payer: Self-pay

## 2021-04-04 ENCOUNTER — Ambulatory Visit (INDEPENDENT_AMBULATORY_CARE_PROVIDER_SITE_OTHER): Payer: 59 | Admitting: Mental Health

## 2021-04-04 DIAGNOSIS — F411 Generalized anxiety disorder: Secondary | ICD-10-CM | POA: Diagnosis not present

## 2021-04-04 NOTE — Progress Notes (Signed)
[facility]  Name: Adam Larsen. Date: 04/04/21 MRN: [id number]   DOB: 1985/12/05 PCP: Patient, No Pcp Per (Inactive)  Time spent: 52 minutes  Treatment:  Individual therapy  Mental Status Exam:    Appearance:   Casual     Behavior:  Appropriate  Motor:  Normal  Speech/Language:   Clear and Coherent  Affect:  full range  Mood:  depressed and pleasant  Thought process:  normal  Thought content:    WNL  Sensory/Perceptual disturbances:    none  Orientation:  x4  Attention:  Good  Concentration:  Good  Memory:  WNL  Fund of knowledge:   Good  Insight:    Good  Judgment:   Good  Impulse Control:  Good   Reported Symptoms:  Sleep problems-wakes in the middle of the night, low motivation, isolative, intermittent depressed mood, anxiety, struggles with daily hygiene (not showering, brushing teeth etc), self depreciating thoughts  Risk Assessment: Danger to Self:  No Self-injurious Behavior: No Danger to Others: No Duty to Warn:no Physical Aggression / Violence:No  Access to Firearms a concern: No  Gang Involvement:No  Patient / guardian was educated about steps to take if suicide or homicide risk level increases between visits: yes While future psychiatric events cannot be accurately predicted, the patient does not currently require acute inpatient psychiatric care and does not currently meet [facility] involuntary commitment criteria.    Medications: Current Outpatient Medications  Medication Sig Dispense Refill   Ascorbic Acid (VITAMIN C) 1000 MG tablet Take 2,000 mg by mouth daily.     buPROPion (WELLBUTRIN XL) 300 MG 24 hr tablet Take 1 tablet (300 mg total) by mouth daily. 30 tablet 2   cholecalciferol (VITAMIN D3) 25 MCG (1000 UNIT) tablet Take 2,000 Units by mouth daily.     lamoTRIgine (LAMICTAL) 150 MG tablet Take 1 tablet (150 mg total) by mouth at bedtime. 30 tablet 2   loratadine (CLARITIN) 10 MG tablet Take 1 tablet (10 mg total) by  mouth daily. 30 tablet 0   Multiple Vitamin (MULTIVITAMIN) capsule Take 1 capsule by mouth daily.     omega-3 acid ethyl esters (LOVAZA) 1 g capsule Take 1 capsule (1 g total) by mouth 2 (two) times daily. 60 capsule 0   Omega-3 Fatty Acids (FISH OIL) 1000 MG CAPS Take 1,000 mg by mouth daily.     perphenazine (TRILAFON) 2 MG tablet Take 1 tablet (2 mg) by mouth with a 4 mg tablet twice a day (total of 6 mg bid) 60 tablet 2   perphenazine (TRILAFON) 4 MG tablet Take 1 tablet (4 mg) by mouth with a 2 mg tablet twice a day. (6 mg bid) 60 tablet 2   perphenazine (TRILAFON) 8 MG tablet Take 1 tablet (8 mg total) by mouth 2 (two) times daily. 60 tablet 2   No current facility-administered medications for this visit.    Subjective: Patient presents for session on time, sharing progress.  He shared how his brother moved out about 3 weeks ago.  He says that he and his father got into an argument as they were discussing his brother.  He stated his father is hard on his brother, was making statements about him needing to get a job quicker as he moved into the apartment that he is planning for for the last 3 weeks.  Patient shared how his brother and was trying to communicate to his father to give him time to get settled.  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  He stated his parents do not verbalize support, his mother stating he will not make enough money.  Collaboratively, patient identified the need to further evaluate this career path plans to follow through with research as discussed in session to feel more comfortable in going forward with this decision.  He is struggling with his sleep patterns, states he is trying to psychosocial interventions to promote getting to sleep and staying asleep however, feels it is one of his medications and we informed this provider of concerns.  Patient verbalized understanding that he could call our office between  sessions as needed regarding any concerns and specifically medication questions.  Interventions: CBT, supportive therapy, problem solving strategies  Diagnoses:    ICD-10-CM   1. GAD (generalized anxiety disorder)  F41.1          Plan: Patient is to use CBT, mindfulness and coping skills to help manage decrease symptoms associated with their diagnosis.    Patient to look for employment to obtain independence.    Long-term goal:   Reduce overall level, frequency, and intensity of the feelings of depression and anxiety 7-8/10 to a 0-2/10 in severity for at least 3 consecutive months.   Short-term goal:  Decrease "deflating, self-doubting" and catastrophizing thinking style Decrease anxiety producing self talk such as thinking of the worse possible life outcome Decrease ruminating, which can affect his sleep and increased emotional distress Continue to take medications as prescribed Increased self-confidence by engaging in an exercise regimen, working out Increase steps toward independence by finding employment and eventually moving out of this parent's home    Assessment of progress:  progressing  [name] Session, [facility]

## 2021-04-17 ENCOUNTER — Ambulatory Visit (INDEPENDENT_AMBULATORY_CARE_PROVIDER_SITE_OTHER): Payer: 59 | Admitting: Adult Health

## 2021-04-17 ENCOUNTER — Other Ambulatory Visit: Payer: Self-pay

## 2021-04-17 ENCOUNTER — Encounter: Payer: Self-pay | Admitting: Adult Health

## 2021-04-17 DIAGNOSIS — F22 Delusional disorders: Secondary | ICD-10-CM

## 2021-04-17 DIAGNOSIS — G4700 Insomnia, unspecified: Secondary | ICD-10-CM | POA: Diagnosis not present

## 2021-04-17 DIAGNOSIS — F333 Major depressive disorder, recurrent, severe with psychotic symptoms: Secondary | ICD-10-CM

## 2021-04-17 DIAGNOSIS — F411 Generalized anxiety disorder: Secondary | ICD-10-CM

## 2021-04-17 NOTE — Progress Notes (Signed)
Adam Larsen [id number] 11/21/1985 35 y.o.  Subjective:   Patient ID:  Adam Larsen. is a 35 y.o. (DOB 1986-02-23) male.  Chief Complaint: No chief complaint on file.   HPI Adam Larsen. presents to the office today for follow-up of MDD, GAD, insomnia, paranoia, and ADHD.  Describes mood today as "ok". Pleasant. Mood symptoms - denies depression, anxiety, and irritability. Mood is "pretty flat". Denies paranoia. Has decreased Perphenazine dose to 6mg  in the morning and 8mg  in the evening. Would like to continue to titrate down - take 6mg  twice a day. Seeing for therapy. Improved interest and motivation. Taking medications as prescribed.  Energy levels improved. Active, does not have a regular exercise routine. Walking most days. Enjoys some usual interests and activities. Single. Lives with parents. Spending time with family.  Appetite adequate. Weight gain 5 pounds - 202 pounds. Sleeps better some nights than others. Averages 7 to 8 hours with Trazadone. Napping less - in the morning  30 minutes to an hour. Focus and concentration "not the best". Diagnosed with ADHD in childhood. Completing tasks around the house. Managing aspects of household.  Denies SI or HI.   Denies AH or VH.     PHQ2-9    Flowsheet Row ED from 05/16/2020 in [facility] EMERGENCY DEPARTMENT  PHQ-2 Total Score 6  PHQ-9 Total Score 17      Flowsheet Row ED from 04/19/2020 in [facility] EMERGENCY DEPARTMENT  C-SSRS RISK CATEGORY Error: Q2 is Yes, you must answer 3, 4, and 5        Review of Systems:  Review of Systems  Musculoskeletal:  Negative for gait problem.  Neurological:  Negative for tremors.  Psychiatric/Behavioral:         Please refer to HPI   Medications: I have reviewed the patient's current medications.  Current Outpatient Medications  Medication Sig Dispense Refill   Ascorbic Acid (VITAMIN C) 1000 MG tablet Take 2,000 mg  by mouth daily.     buPROPion (WELLBUTRIN XL) 300 MG 24 hr tablet Take 1 tablet (300 mg total) by mouth daily. 30 tablet 2   cholecalciferol (VITAMIN D3) 25 MCG (1000 UNIT) tablet Take 2,000 Units by mouth daily.     lamoTRIgine (LAMICTAL) 150 MG tablet Take 1 tablet (150 mg total) by mouth at bedtime. 30 tablet 2   loratadine (CLARITIN) 10 MG tablet Take 1 tablet (10 mg total) by mouth daily. 30 tablet 0   Multiple Vitamin (MULTIVITAMIN) capsule Take 1 capsule by mouth daily.     omega-3 acid ethyl esters (LOVAZA) 1 g capsule Take 1 capsule (1 g total) by mouth 2 (two) times daily. 60 capsule 0   Omega-3 Fatty Acids (FISH OIL) 1000 MG CAPS Take 1,000 mg by mouth daily.     perphenazine (TRILAFON) 2 MG tablet Take 1 tablet (2 mg) by mouth with a 4 mg tablet twice a day (total of 6 mg bid) 60 tablet 2   perphenazine (TRILAFON) 4 MG tablet Take 1 tablet (4 mg) by mouth with a 2 mg tablet twice a day. (6 mg bid) 60 tablet 2   perphenazine (TRILAFON) 8 MG tablet Take 1 tablet (8 mg total) by mouth 2 (two) times daily. 60 tablet 2   No current facility-administered medications for this visit.    Medication Side Effects: None  Allergies: No Known Allergies  Past Medical History:  Diagnosis Date   Anxiety    Bipolar 1  disorder (HCC)    Depression    Mania (HCC)    Suicidal ideations     Past Medical History, Surgical history, Social history, and Family history were reviewed and updated as appropriate.   Please see review of systems for further details on the patient's review from today.   Objective:   Physical Exam:  There were no vitals taken for this visit.  Physical Exam Constitutional:      General: He is not in acute distress. Musculoskeletal:        General: No deformity.  Neurological:     Mental Status: He is alert and oriented to person, place, and time.     Coordination: Coordination normal.  Psychiatric:        Attention and Perception: Attention and perception  normal. He does not perceive auditory or visual hallucinations.        Mood and Affect: Mood normal. Mood is not anxious or depressed. Affect is not labile, blunt, angry or inappropriate.        Speech: Speech normal.        Behavior: Behavior normal.        Thought Content: Thought content normal. Thought content is not paranoid or delusional. Thought content does not include homicidal or suicidal ideation. Thought content does not include homicidal or suicidal plan.        Cognition and Memory: Cognition and memory normal.        Judgment: Judgment normal.     Comments: Insight intact    Lab Review:     Component Value Date/Time   NA 140 05/16/2020 0912   K 3.6 05/16/2020 0912   CL 105 05/16/2020 0912   CO2 24 05/16/2020 0912   GLUCOSE 202 (H) 05/16/2020 0912   BUN 11 05/16/2020 0912   CREATININE 1.05 05/16/2020 0912   CALCIUM 9.6 05/16/2020 0912   PROT 7.5 05/16/2020 0912   ALBUMIN 4.3 05/16/2020 0912   AST 20 05/16/2020 0912   ALT 27 05/16/2020 0912   ALKPHOS 73 05/16/2020 0912   BILITOT 0.7 05/16/2020 0912   GFRNONAA >60 05/16/2020 0912   GFRAA >60 05/16/2020 0912       Component Value Date/Time   WBC 12.5 (H) 05/16/2020 0912   RBC 5.12 05/16/2020 0912   HGB 15.3 05/16/2020 0912   HCT 45.6 05/16/2020 0912   PLT 392 05/16/2020 0912   MCV 89.1 05/16/2020 0912   MCH 29.9 05/16/2020 0912   MCHC 33.6 05/16/2020 0912   RDW 12.7 05/16/2020 0912   LYMPHSABS 2.4 04/19/2020 1252   MONOABS 0.8 04/19/2020 1252   EOSABS 0.0 04/19/2020 1252   BASOSABS 0.1 04/19/2020 1252    No results found for: POCLITH, LITHIUM   No results found for: PHENYTOIN, PHENOBARB, VALPROATE, CBMZ   .res Assessment: Plan:      Plan:  1. Decrease Perphenazine 6mg  in am and 8mg  at bedtime to 6mg  in am and 6mg  at bedtime. 2. Wellbutrin XL 300mg  daily 3. Lamictal 200mg  hs 4. Xanax 0.5mg  prn - not taking  5. Trazadone - not taking  RTC 4 weeks  Patient advised to contact office with any  questions, adverse effects, or acute worsening in signs and symptoms.  Discussed potential metabolic side effects associated with atypical antipsychotics, as well as potential risk for movement side effects. Advised pt to contact office if movement side effects occur.   Discussed potential benefits, risk, and side effects of benzodiazepines to include potential risk of tolerance and dependence, as well  as possible drowsiness.  Advised patient not to drive if experiencing drowsiness and to take lowest possible effective dose to minimize risk of dependence and tolerance.  Diagnoses and all orders for this visit:  GAD (generalized anxiety disorder)  Paranoia (HCC)  Insomnia, unspecified type  Severe episode of recurrent major depressive disorder, with psychotic features (HCC)    Please see After Visit Summary for patient specific instructions.  Future Appointments  Date Time Provider Department Center  04/20/2021  8:00 AM [name] Session, [facility] CP-CP None  05/15/2021  8:20 AM Leng Montesdeoca, Thereasa Solo, NP CP-CP None  05/17/2021  8:00 AM [name] Session, [facility] CP-CP None  05/31/2021  8:00 AM [name] Session, [facility] CP-CP None    No orders of the defined types were placed in this encounter.   -------------------------------

## 2021-04-20 ENCOUNTER — Other Ambulatory Visit: Payer: Self-pay

## 2021-04-20 ENCOUNTER — Ambulatory Visit (INDEPENDENT_AMBULATORY_CARE_PROVIDER_SITE_OTHER): Payer: 59 | Admitting: Mental Health

## 2021-04-20 DIAGNOSIS — F411 Generalized anxiety disorder: Secondary | ICD-10-CM

## 2021-04-20 NOTE — Progress Notes (Signed)
[facility]  Name: Adam Larsen. Date: 04/20/21 MRN: [id number]   DOB: June 27, 1986 PCP: Patient, No Pcp Per (Inactive)  Time spent: 54 minutes  Treatment:  Individual therapy  Mental Status Exam:    Appearance:   Casual     Behavior:  Appropriate  Motor:  Normal  Speech/Language:   Clear and Coherent  Affect:  full range  Mood:  depressed and pleasant  Thought process:  normal  Thought content:    WNL  Sensory/Perceptual disturbances:    none  Orientation:  x4  Attention:  Good  Concentration:  Good  Memory:  WNL  Fund of knowledge:   Good  Insight:    Good  Judgment:   Good  Impulse Control:  Good   Reported Symptoms:  Sleep problems-wakes in the middle of the night, low motivation, isolative, intermittent depressed mood, anxiety, struggles with daily hygiene (not showering, brushing teeth etc), self depreciating thoughts  Risk Assessment: Danger to Self:  No Self-injurious Behavior: No Danger to Others: No Duty to Warn:no Physical Aggression / Violence:No  Access to Firearms a concern: No  Gang Involvement:No  Patient / guardian was educated about steps to take if suicide or homicide risk level increases between visits: yes While future psychiatric events cannot be accurately predicted, the patient does not currently require acute inpatient psychiatric care and does not currently meet [facility] involuntary commitment criteria.    Medications: Current Outpatient Medications  Medication Sig Dispense Refill   Ascorbic Acid (VITAMIN C) 1000 MG tablet Take 2,000 mg by mouth daily.     buPROPion (WELLBUTRIN XL) 300 MG 24 hr tablet Take 1 tablet (300 mg total) by mouth daily. 30 tablet 2   cholecalciferol (VITAMIN D3) 25 MCG (1000 UNIT) tablet Take 2,000 Units by mouth daily.     lamoTRIgine (LAMICTAL) 150 MG tablet Take 1 tablet (150 mg total) by mouth at bedtime. 30 tablet 2   loratadine (CLARITIN) 10 MG tablet Take 1 tablet (10 mg total) by  mouth daily. 30 tablet 0   Multiple Vitamin (MULTIVITAMIN) capsule Take 1 capsule by mouth daily.     omega-3 acid ethyl esters (LOVAZA) 1 g capsule Take 1 capsule (1 g total) by mouth 2 (two) times daily. 60 capsule 0   Omega-3 Fatty Acids (FISH OIL) 1000 MG CAPS Take 1,000 mg by mouth daily.     perphenazine (TRILAFON) 2 MG tablet Take 1 tablet (2 mg) by mouth with a 4 mg tablet twice a day (total of 6 mg bid) 60 tablet 2   perphenazine (TRILAFON) 4 MG tablet Take 1 tablet (4 mg) by mouth with a 2 mg tablet twice a day. (6 mg bid) 60 tablet 2   perphenazine (TRILAFON) 8 MG tablet Take 1 tablet (8 mg total) by mouth 2 (two) times daily. 60 tablet 2   No current facility-administered medications for this visit.    Subjective: Patient presents for session on time.  Assessed progress where he stated that he continues to feel low motivation, depressed mood.  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  Patient was uncertain of the total cost as well of getting that degree and feels his father may have reluctance to help financially.  Encouraged patient to do more research related to obtaining the certification, cost, job availability etc.  Patient stated that he did learn that having the  job over time and result in a yearly salary that he found desirable.  He stated that his brother has moved out, how his parents are helping him financially for the next few months as he looks for a job.  Patient continues to maintain not wanting to find employment that will be working with customers, wanting more of a job that he is not around others.  He also denies wanting to find a job similar to his previous, which was in [employer] due to several factors identified, such as working in the elements, with customers, labors etc.  He reports his medications appear effective, denies "paranoia" as expressed  previously.  He continues to also struggle with engaging in any exercise recently.  Continues to express not wanting to be "stuck in a job" long-term with which he ultimately will be unhappy.  He continues to express wanting more independence and we facilitate his identifying what steps he feels are necessary to make progress in that direction.  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    Interventions: CBT, supportive therapy, problem solving strategies  Diagnoses:    ICD-10-CM   1. GAD (generalized anxiety disorder)  F41.1           Plan: Patient is to use CBT, mindfulness and coping skills to help manage decrease symptoms associated with their diagnosis.  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  Long-term goal:   Reduce overall level, frequency, and intensity of the feelings of depression and anxiety 7-8/10 to a 0-2/10 in severity for at least 3 consecutive months.   Short-term goal:  Decrease "deflating, self-doubting" and catastrophizing thinking style Decrease anxiety producing self talk such as thinking of the worse possible life outcome Decrease ruminating, which can affect his sleep and increased emotional distress Continue to take medications as prescribed Increased self-confidence by engaging in an exercise regimen, working out Increase steps toward independence by finding employment and eventually moving out of this parent's home    Assessment of progress:  progressing  [name] Session, [facility]

## 2021-05-15 ENCOUNTER — Ambulatory Visit (INDEPENDENT_AMBULATORY_CARE_PROVIDER_SITE_OTHER): Payer: 59 | Admitting: Adult Health

## 2021-05-15 ENCOUNTER — Other Ambulatory Visit: Payer: Self-pay

## 2021-05-15 ENCOUNTER — Encounter: Payer: Self-pay | Admitting: Adult Health

## 2021-05-15 DIAGNOSIS — G4700 Insomnia, unspecified: Secondary | ICD-10-CM

## 2021-05-15 DIAGNOSIS — F22 Delusional disorders: Secondary | ICD-10-CM

## 2021-05-15 DIAGNOSIS — F331 Major depressive disorder, recurrent, moderate: Secondary | ICD-10-CM

## 2021-05-15 DIAGNOSIS — F411 Generalized anxiety disorder: Secondary | ICD-10-CM

## 2021-05-15 DIAGNOSIS — F333 Major depressive disorder, recurrent, severe with psychotic symptoms: Secondary | ICD-10-CM | POA: Diagnosis not present

## 2021-05-15 DIAGNOSIS — F319 Bipolar disorder, unspecified: Secondary | ICD-10-CM

## 2021-05-15 MED ORDER — HYDROXYZINE PAMOATE 50 MG PO CAPS
50.0000 mg | ORAL_CAPSULE | Freq: Every day | ORAL | 2 refills | Status: DC
Start: 2021-05-15 — End: 2021-06-13

## 2021-05-15 MED ORDER — LAMOTRIGINE 150 MG PO TABS: 150.0000 mg | ORAL_TABLET | Freq: Every day | ORAL | 2 refills | Status: DC

## 2021-05-15 MED ORDER — PERPHENAZINE 4 MG PO TABS: ORAL_TABLET | ORAL | 2 refills | Status: DC

## 2021-05-15 MED ORDER — BUPROPION HCL ER (XL) 300 MG PO TB24: 300.0000 mg | ORAL_TABLET | Freq: Every day | ORAL | 2 refills | Status: DC

## 2021-05-15 MED ORDER — PERPHENAZINE 2 MG PO TABS: ORAL_TABLET | ORAL | 2 refills | Status: DC

## 2021-05-15 NOTE — Progress Notes (Signed)
Adam Larsen [id number] January 28, 1986 35 y.o.  Subjective:   Patient ID:  Adam Larsen. is a 35 y.o. (DOB 1986/09/12) male.  Chief Complaint: No chief complaint on file.   HPI Adam Larsen. presents to the office today for follow-up of MDD, GAD, insomnia, paranoia, and ADHD.  Describes mood today as "ok". Pleasant. Mood symptoms - denies depression, anxiety, and irritability. Mood remains "flat".  Stating "I'm doing alright". Has decreased Perphenazine dose to 6mg  in the morning and 6mg  in the evening. Concerned about "broken sleep". Has tried various doses of Trazadone with no improvement. Seeing for therapy. Improved interest and motivation. Taking medications as prescribed.  Energy levels improved. Active, does not have a regular exercise routine. Not walking as much  - "brutally hot". Enjoys some usual interests and activities. Single. Lives with parents. Spending time with family.  Appetite adequate. Weight gain 3 pounds - 202 to 205 pounds. Sleeps better some nights than others. Averages 5 to 6 hours of broken sleep with Trazadone. Napping more during the day.  Focus and concentration difficulties. Diagnosed with ADHD in childhood. Completing tasks around the house. Managing aspects of household.  Denies SI or HI.   Denies AH or VH.  Denies paranoia.     PHQ2-9    Flowsheet Row ED from 05/16/2020 in [facility] EMERGENCY DEPARTMENT  PHQ-2 Total Score 6  PHQ-9 Total Score 17      Flowsheet Row ED from 04/19/2020 in [facility] EMERGENCY DEPARTMENT  C-SSRS RISK CATEGORY Error: Q2 is Yes, you must answer 3, 4, and 5        Review of Systems:  Review of Systems  Musculoskeletal:  Negative for gait problem.  Neurological:  Negative for tremors.  Psychiatric/Behavioral:         Please refer to HPI   Medications: I have reviewed the patient's current medications.  Current Outpatient Medications  Medication Sig  Dispense Refill   hydrOXYzine (VISTARIL) 50 MG capsule Take 1 capsule (50 mg total) by mouth at bedtime. 30 capsule 2   Ascorbic Acid (VITAMIN C) 1000 MG tablet Take 2,000 mg by mouth daily.     buPROPion (WELLBUTRIN XL) 300 MG 24 hr tablet Take 1 tablet (300 mg total) by mouth daily. 30 tablet 2   cholecalciferol (VITAMIN D3) 25 MCG (1000 UNIT) tablet Take 2,000 Units by mouth daily.     lamoTRIgine (LAMICTAL) 150 MG tablet Take 1 tablet (150 mg total) by mouth at bedtime. 30 tablet 2   loratadine (CLARITIN) 10 MG tablet Take 1 tablet (10 mg total) by mouth daily. 30 tablet 0   Multiple Vitamin (MULTIVITAMIN) capsule Take 1 capsule by mouth daily.     omega-3 acid ethyl esters (LOVAZA) 1 g capsule Take 1 capsule (1 g total) by mouth 2 (two) times daily. 60 capsule 0   Omega-3 Fatty Acids (FISH OIL) 1000 MG CAPS Take 1,000 mg by mouth daily.     perphenazine (TRILAFON) 2 MG tablet Take 1 tablet (2 mg) by mouth with a 4 mg tablet twice a day (total of 6 mg bid) 60 tablet 2   perphenazine (TRILAFON) 4 MG tablet Take 1 tablet (4 mg) by mouth with a 2 mg tablet twice a day. (6 mg bid) 60 tablet 2   No current facility-administered medications for this visit.    Medication Side Effects: None  Allergies: No Known Allergies  Past Medical History:  Diagnosis Date   Anxiety  Bipolar 1 disorder (HCC)    Depression    Mania (HCC)    Suicidal ideations     Past Medical History, Surgical history, Social history, and Family history were reviewed and updated as appropriate.   Please see review of systems for further details on the patient's review from today.   Objective:   Physical Exam:  There were no vitals taken for this visit.  Physical Exam Constitutional:      General: He is not in acute distress. Musculoskeletal:        General: No deformity.  Neurological:     Mental Status: He is alert and oriented to person, place, and time.     Coordination: Coordination normal.   Psychiatric:        Attention and Perception: Attention and perception normal. He does not perceive auditory or visual hallucinations.        Mood and Affect: Mood normal. Mood is not anxious or depressed. Affect is not labile, blunt, angry or inappropriate.        Speech: Speech normal.        Behavior: Behavior normal.        Thought Content: Thought content normal. Thought content is not paranoid or delusional. Thought content does not include homicidal or suicidal ideation. Thought content does not include homicidal or suicidal plan.        Cognition and Memory: Cognition and memory normal.        Judgment: Judgment normal.     Comments: Insight intact    Lab Review:     Component Value Date/Time   NA 140 05/16/2020 0912   K 3.6 05/16/2020 0912   CL 105 05/16/2020 0912   CO2 24 05/16/2020 0912   GLUCOSE 202 (H) 05/16/2020 0912   BUN 11 05/16/2020 0912   CREATININE 1.05 05/16/2020 0912   CALCIUM 9.6 05/16/2020 0912   PROT 7.5 05/16/2020 0912   ALBUMIN 4.3 05/16/2020 0912   AST 20 05/16/2020 0912   ALT 27 05/16/2020 0912   ALKPHOS 73 05/16/2020 0912   BILITOT 0.7 05/16/2020 0912   GFRNONAA >60 05/16/2020 0912   GFRAA >60 05/16/2020 0912       Component Value Date/Time   WBC 12.5 (H) 05/16/2020 0912   RBC 5.12 05/16/2020 0912   HGB 15.3 05/16/2020 0912   HCT 45.6 05/16/2020 0912   PLT 392 05/16/2020 0912   MCV 89.1 05/16/2020 0912   MCH 29.9 05/16/2020 0912   MCHC 33.6 05/16/2020 0912   RDW 12.7 05/16/2020 0912   LYMPHSABS 2.4 04/19/2020 1252   MONOABS 0.8 04/19/2020 1252   EOSABS 0.0 04/19/2020 1252   BASOSABS 0.1 04/19/2020 1252    No results found for: POCLITH, LITHIUM   No results found for: PHENYTOIN, PHENOBARB, VALPROATE, CBMZ   .res Assessment: Plan:    Plan:  1. Perphenazine 6mg  in am and 6mg  at bedtime   2. Wellbutrin XL 300mg  daily 3. Lamictal 150mg  hs 4. Xanax 0.5mg  prn - not taking  5. D/C Trazadone - 50mg  to 100mg  - not helping  6. Add  Hydroxzine 50mg  at bedtime for sleep.  RTC 4 weeks-  Patient advised to contact office with any questions, adverse effects, or acute worsening in signs and symptoms.  Discussed potential metabolic side effects associated with atypical antipsychotics, as well as potential risk for movement side effects. Advised pt to contact office if movement side effects occur.   Discussed potential benefits, risk, and side effects of benzodiazepines to include potential  risk of tolerance and dependence, as well as possible drowsiness.  Advised patient not to drive if experiencing drowsiness and to take lowest possible effective dose to minimize risk of dependence and tolerance.   Diagnoses and all orders for this visit:  GAD (generalized anxiety disorder)  Paranoia (HCC)  Insomnia, unspecified type -     hydrOXYzine (VISTARIL) 50 MG capsule; Take 1 capsule (50 mg total) by mouth at bedtime.  Severe episode of recurrent major depressive disorder, with psychotic features (HCC)  Bipolar I disorder (HCC) -     perphenazine (TRILAFON) 2 MG tablet; Take 1 tablet (2 mg) by mouth with a 4 mg tablet twice a day (total of 6 mg bid) -     perphenazine (TRILAFON) 4 MG tablet; Take 1 tablet (4 mg) by mouth with a 2 mg tablet twice a day. (6 mg bid)  Major depressive disorder, recurrent episode, moderate (HCC) -     buPROPion (WELLBUTRIN XL) 300 MG 24 hr tablet; Take 1 tablet (300 mg total) by mouth daily.  Other orders -     lamoTRIgine (LAMICTAL) 150 MG tablet; Take 1 tablet (150 mg total) by mouth at bedtime.    Please see After Visit Summary for patient specific instructions.  Future Appointments  Date Time Provider Department Center  05/17/2021  8:00 AM [name] Session, [facility] None  05/31/2021  8:00 AM [name] Session, [facility] None    No orders of the defined types were placed in this encounter.   -------------------------------

## 2021-05-17 ENCOUNTER — Ambulatory Visit: Payer: 59 | Admitting: Mental Health

## 2021-05-24 ENCOUNTER — Telehealth: Payer: Self-pay | Admitting: Adult Health

## 2021-05-24 NOTE — Telephone Encounter (Signed)
No VM set up will try again

## 2021-05-24 NOTE — Telephone Encounter (Signed)
Pt called to LM that his current sleep medication is not working. He would like to try something else.

## 2021-05-31 ENCOUNTER — Ambulatory Visit (INDEPENDENT_AMBULATORY_CARE_PROVIDER_SITE_OTHER): Payer: 59 | Admitting: Mental Health

## 2021-05-31 ENCOUNTER — Other Ambulatory Visit: Payer: Self-pay

## 2021-05-31 DIAGNOSIS — F411 Generalized anxiety disorder: Secondary | ICD-10-CM

## 2021-05-31 NOTE — Progress Notes (Signed)
[facility]  Name: Adam Larsen. Date: 05/31/21 MRN: [id number]   DOB: 10-Feb-1986 PCP: Patient, No Pcp Per (Inactive)  Time spent: 54 minutes  Treatment:  Individual therapy  Mental Status Exam:    Appearance:   Casual     Behavior:  Appropriate  Motor:  Normal  Speech/Language:   Clear and Coherent  Affect:  full range  Mood:  depressed and pleasant  Thought process:  normal  Thought content:    WNL  Sensory/Perceptual disturbances:    none  Orientation:  x4  Attention:  Good  Concentration:  Good  Memory:  WNL  Fund of knowledge:   Good  Insight:    Good  Judgment:   Good  Impulse Control:  Good   Reported Symptoms:  Sleep problems-wakes in the middle of the night, low motivation, isolative, intermittent depressed mood, anxiety, struggles with daily hygiene (not showering, brushing teeth etc), self depreciating thoughts  Risk Assessment: Danger to Self:  No Self-injurious Behavior: No Danger to Others: No Duty to Warn:no Physical Aggression / Violence:No  Access to Firearms a concern: No  Gang Involvement:No  Patient / guardian was educated about steps to take if suicide or homicide risk level increases between visits: yes While future psychiatric events cannot be accurately predicted, the patient does not currently require acute inpatient psychiatric care and does not currently meet [facility] involuntary commitment criteria.    Medications: Current Outpatient Medications  Medication Sig Dispense Refill   Ascorbic Acid (VITAMIN C) 1000 MG tablet Take 2,000 mg by mouth daily.     buPROPion (WELLBUTRIN XL) 300 MG 24 hr tablet Take 1 tablet (300 mg total) by mouth daily. 30 tablet 2   cholecalciferol (VITAMIN D3) 25 MCG (1000 UNIT) tablet Take 2,000 Units by mouth daily.     hydrOXYzine (VISTARIL) 50 MG capsule Take 1 capsule (50 mg total) by mouth at bedtime. 30 capsule 2   lamoTRIgine (LAMICTAL) 150 MG tablet Take 1 tablet (150 mg total)  by mouth at bedtime. 30 tablet 2   loratadine (CLARITIN) 10 MG tablet Take 1 tablet (10 mg total) by mouth daily. 30 tablet 0   Multiple Vitamin (MULTIVITAMIN) capsule Take 1 capsule by mouth daily.     omega-3 acid ethyl esters (LOVAZA) 1 g capsule Take 1 capsule (1 g total) by mouth 2 (two) times daily. 60 capsule 0   Omega-3 Fatty Acids (FISH OIL) 1000 MG CAPS Take 1,000 mg by mouth daily.     perphenazine (TRILAFON) 2 MG tablet Take 1 tablet (2 mg) by mouth with a 4 mg tablet twice a day (total of 6 mg bid) 60 tablet 2   perphenazine (TRILAFON) 4 MG tablet Take 1 tablet (4 mg) by mouth with a 2 mg tablet twice a day. (6 mg bid) 60 tablet 2   No current facility-administered medications for this visit.    Subjective: Patient presents for session on time.  He shared progress, how he and his father got into an argument recently.  He stated that he wanted to wait in the car as they were taking a trip out of town, his dad getting upset, ultimately patient withdrawing from the trip.  He stated that his mother and father then got into an argument and he has been giving them the "silent treatment for the last 3 days.  He stated that his father apologized, and he accepted it, however, his dad appears to still be upset.  He went on to  share the struggles of continuing to live with his parents.  Feels like he still treated like "a child".  He stated he understands while they watch to see if he is taking his medication due to his significant health issues last year but would also like to be given more independence and autonomy.  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  Facilitated his identifying thoughts that may interfere with his self validation, potential where he admitted that he struggles with confidence.  He plans to take incremental steps and looking into reaching this  goal.    Interventions: CBT, supportive therapy, problem solving strategies  Diagnoses:    ICD-10-CM   1. GAD (generalized anxiety disorder)  F41.1          Plan: Patient is to use CBT, mindfulness and coping skills to help manage decrease symptoms associated with their diagnosis.  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  Long-term goal:   Reduce overall level, frequency, and intensity of the feelings of depression and anxiety 7-8/10 to a 0-2/10 in severity for at least 3 consecutive months.   Short-term goal:  Decrease "deflating, self-doubting" and catastrophizing thinking style Decrease anxiety producing self talk such as thinking of the worse possible life outcome Decrease ruminating, which can affect his sleep and increased emotional distress Continue to take medications as prescribed Increased self-confidence by engaging in an exercise regimen, working out Increase steps toward independence by finding employment and eventually moving out of this parent's home    Assessment of progress:  progressing  [name] Session, [facility]

## 2021-06-04 NOTE — Telephone Encounter (Signed)
It's in his chart - do we need to increase dose?

## 2021-06-04 NOTE — Telephone Encounter (Signed)
Pt stated if you think that's best then yes

## 2021-06-05 NOTE — Telephone Encounter (Signed)
Have him increase dose for a few nights to see if helpful, if not we can try something else.

## 2021-06-05 NOTE — Telephone Encounter (Signed)
Can you look in his chart - it's documented there.

## 2021-06-05 NOTE — Telephone Encounter (Signed)
Ok please let me know what he should increase to.2 a night?

## 2021-06-05 NOTE — Telephone Encounter (Signed)
The last note did not say anything about increasing the hydroxyzine since he just started it.Advised pt to take 2 a night and see if that helps.

## 2021-06-06 NOTE — Telephone Encounter (Signed)
Noted  

## 2021-06-13 ENCOUNTER — Ambulatory Visit (INDEPENDENT_AMBULATORY_CARE_PROVIDER_SITE_OTHER): Payer: 59 | Admitting: Adult Health

## 2021-06-13 ENCOUNTER — Other Ambulatory Visit: Payer: Self-pay

## 2021-06-13 ENCOUNTER — Encounter: Payer: Self-pay | Admitting: Adult Health

## 2021-06-13 DIAGNOSIS — F22 Delusional disorders: Secondary | ICD-10-CM

## 2021-06-13 DIAGNOSIS — F411 Generalized anxiety disorder: Secondary | ICD-10-CM

## 2021-06-13 DIAGNOSIS — G4700 Insomnia, unspecified: Secondary | ICD-10-CM | POA: Diagnosis not present

## 2021-06-13 DIAGNOSIS — F333 Major depressive disorder, recurrent, severe with psychotic symptoms: Secondary | ICD-10-CM

## 2021-06-13 MED ORDER — MIRTAZAPINE 15 MG PO TABS: ORAL_TABLET | ORAL | 5 refills | Status: DC

## 2021-06-13 NOTE — Progress Notes (Signed)
Adam Larsen [id number] [birth year] 35 y.o.  Subjective:   Patient ID:  Adam Larsen. is a 35 y.o. (DOB 1986/05/30) male.  Chief Complaint: No chief complaint on file.   HPI [name]. presents to the office today for follow-up of MDD, GAD, insomnia, paranoia, and ADHD.  Describes mood today as "ok". Pleasant. Mood symptoms - denies depression, anxiety, and irritability. Mood remains consistent. Stating "I'm doing ok". Concerned about sleep pattern. Getting to sleep and waking up during the night. Feeling tired the following day and taking a 3 hour nap. Wanting to break the cycle and have a more consistent sleep schedule. Previous sleep aids have been ineffective. Is willing to try Remeron. Seeing [name] for therapy. Improved interest and motivation. Taking medications as prescribed.  Energy levels improved. Active, does not have a regular exercise routine. Walking some days.  Enjoys some usual interests and activities. Single. Lives with parents. Spending time with family.  Appetite adequate. Weight stable - 205 pounds. Sleeps better some nights than others. Able to fall asleep. Averages 5 to 6 hours of broken sleep during the night and 2 to 3 hours during the day. Focus and concentration difficulties. Diagnosed with ADHD in childhood. Completing tasks around the house. Managing aspects of household.  Denies SI or HI.   Denies AH or VH.  Denies paranoia.      PHQ2-9    Flowsheet Row ED from 05/16/2020 in [facility] EMERGENCY DEPARTMENT  PHQ-2 Total Score 6  PHQ-9 Total Score 17      Flowsheet Row ED from 04/19/2020 in [facility] EM[facility]ENC[facility] DEPARTMENT  C-SSRS RISK CATEGORY Error: Q2 is Yes, you must answer 3, 4, and 5        Review of Systems:  Review of Systems  Musculoskeletal:  Negative for gait problem.  Neurological:  Negative for tremors.  Psychiatric/Behavioral:         Please refer to HPI   Medications: I  have reviewed the patient's current medications.  Current Outpatient Medications  Medication Sig Dispense Refill   mirtazapine (REMERON) 15 MG tablet Take 1/2 tablet at bedtime for 7 nights, then take one tablet at bedtime. 30 tablet 5   Ascorbic Acid (VITAMIN C) 1000 MG tablet Take 2,000 mg by mouth daily.     buPROPion (WELLBUTRIN XL) 300 MG 24 hr tablet Take 1 tablet (300 mg total) by mouth daily. 30 tablet 2   cholecalciferol (VITAMIN D3) 25 MCG (1000 UNIT) tablet Take 2,000 Units by mouth daily.     lamoTRIgine (LAMICTAL) 150 MG tablet Take 1 tablet (150 mg total) by mouth at bedtime. 30 tablet 2   loratadine (CLARITIN) 10 MG tablet Take 1 tablet (10 mg total) by mouth daily. 30 tablet 0   Multiple Vitamin (MULTIVITAMIN) capsule Take 1 capsule by mouth daily.     omega-3 acid ethyl esters (LOVAZA) 1 g capsule Take 1 capsule (1 g total) by mouth 2 (two) times daily. 60 capsule 0   Omega-3 Fatty Acids (FISH OIL) 1000 MG CAPS Take 1,000 mg by mouth daily.     perphenazine (TRILAFON) 2 MG tablet Take 1 tablet (2 mg) by mouth with a 4 mg tablet twice a day (total of 6 mg bid) 60 tablet 2   perphenazine (TRILAFON) 4 MG tablet Take 1 tablet (4 mg) by mouth with a 2 mg tablet twice a day. (6 mg bid) 60 tablet 2   No current facility-administered medications for this  visit.    Medication Side Effects: None  Allergies: No Known Allergies  Past Medical History:  Diagnosis Date   Anxiety    Bipolar 1 disorder (HCC)    Depression    Mania (HCC)    Suicidal ideations     Past Medical History, Surgical history, Social history, and Family history were reviewed and updated as appropriate.   Please see review of systems for further details on the patient's review from today.   Objective:   Physical Exam:  There were no vitals taken for this visit.  Physical Exam Constitutional:      General: He is not in acute distress. Musculoskeletal:        General: No deformity.  Neurological:      Mental Status: He is alert and oriented to person, place, and time.     Coordination: Coordination normal.  Psychiatric:        Attention and Perception: Attention and perception normal. He does not perceive auditory or visual hallucinations.        Mood and Affect: Mood normal. Mood is not anxious or depressed. Affect is not labile, blunt, angry or inappropriate.        Speech: Speech normal.        Behavior: Behavior normal.        Thought Content: Thought content normal. Thought content is not paranoid or delusional. Thought content does not include homicidal or suicidal ideation. Thought content does not include homicidal or suicidal plan.        Cognition and Memory: Cognition and memory normal.        Judgment: Judgment normal.     Comments: Insight intact    Lab Review:     Component Value Date/Time   NA 140 05/16/2020 0912   K 3.6 05/16/2020 0912   CL 105 05/16/2020 0912   CO2 24 05/16/2020 0912   GLUCOSE 202 (H) 05/16/2020 0912   BUN 11 05/16/2020 0912   CREATININE 1.05 05/16/2020 0912   CALCIUM 9.6 05/16/2020 0912   PROT 7.5 05/16/2020 0912   ALBUMIN 4.3 05/16/2020 0912   AST 20 05/16/2020 0912   ALT 27 05/16/2020 0912   ALKPHOS 73 05/16/2020 0912   BILITOT 0.7 05/16/2020 0912   GFRNONAA >60 05/16/2020 0912   GFRAA >60 05/16/2020 0912       Component Value Date/Time   WBC 12.5 (H) 05/16/2020 0912   RBC 5.12 05/16/2020 0912   HGB 15.3 05/16/2020 0912   HCT 45.6 05/16/2020 0912   PLT 392 05/16/2020 0912   MCV 89.1 05/16/2020 0912   MCH 29.9 05/16/2020 0912   MCHC 33.6 05/16/2020 0912   RDW 12.7 05/16/2020 0912   LYMPHSABS 2.4 04/19/2020 1252   MONOABS 0.8 04/19/2020 1252   EOSABS 0.0 04/19/2020 1252   BASOSABS 0.1 04/19/2020 1252    No results found for: POCLITH, LITHIUM   No results found for: PHENYTOIN, PHENOBARB, VALPROATE, CBMZ   .res Assessment: Plan:     Plan:  1. Perphenazine 6mg  in am and 6mg  at bedtime   2. Wellbutrin XL 300mg  daily 3.  Lamictal 150mg  hs 4. D/C Xanax 0.5mg  prn - not taking  5. D/C Trazadone - 50mg  to 100mg  - not helping  6. D/C Hydroxzine 50mg  at bedtime for sleep. 7. Add Remeron 15mg  - 1/2 tablet at bedtime x 7 nights, then one tablet at bedtime.  RTC 4 weeks  Patient advised to contact office with any questions, adverse effects, or acute worsening in signs  and symptoms.  Discussed potential metabolic side effects associated with atypical antipsychotics, as well as potential risk for movement side effects. Advised pt to contact office if movement side effects occur.   Discussed potential benefits, risk, and side effects of benzodiazepines to include potential risk of tolerance and dependence, as well as possible drowsiness.  Advised patient not to drive if experiencing drowsiness and to take lowest possible effective dose to minimize risk of dependence and tolerance.    Diagnoses and all orders for this visit:  GAD (generalized anxiety disorder)  Insomnia, unspecified type -     mirtazapine (REMERON) 15 MG tablet; Take 1/2 tablet at bedtime for 7 nights, then take one tablet at bedtime.  Paranoia (HCC)  Severe episode of recurrent major depressive disorder, with psychotic features (HCC)    Please see After Visit Summary for patient specific instructions.  Future Appointments  Date Time Provider Department Center  07/05/2021  9:00 AM [name] Session, [facility] CP-CP None  07/16/2021  8:00 AM [name] Session, [facility] CP-CP None  07/17/2021  8:20 AM Kemiyah Tarazon, Thereasa Solo, NP CP-CP None  07/31/2021  8:00 AM [name] Session, [facility] CP-CP None  08/14/2021  8:00 AM [name] Session, [facility] CP-CP None    No orders of the defined types were placed in this encounter.   -------------------------------

## 2021-07-05 ENCOUNTER — Ambulatory Visit: Payer: 59 | Admitting: Mental Health

## 2021-07-16 ENCOUNTER — Ambulatory Visit (INDEPENDENT_AMBULATORY_CARE_PROVIDER_SITE_OTHER): Payer: 59 | Admitting: Mental Health

## 2021-07-16 ENCOUNTER — Other Ambulatory Visit: Payer: Self-pay

## 2021-07-16 DIAGNOSIS — F411 Generalized anxiety disorder: Secondary | ICD-10-CM | POA: Diagnosis not present

## 2021-07-16 NOTE — Progress Notes (Signed)
[facility] Note  Name: Adam Larsen. Date: 07/16/21 MRN: [id number]   DOB: 02-22-1986 PCP: Patient, No Pcp Per (Inactive)  Time spent: 52 minutes  Treatment:  Individual therapy  Mental Status Exam:    Appearance:   Casual     Behavior:  Appropriate  Motor:  Normal  Speech/Language:   Clear and Coherent  Affect:  full range  Mood:  depressed and pleasant  Thought process:  normal  Thought content:    WNL  Sensory/Perceptual disturbances:    none  Orientation:  x4  Attention:  Good  Concentration:  Good  Memory:  WNL  Fund of knowledge:   Good  Insight:    Good  Judgment:   Good  Impulse Control:  Good   Reported Symptoms:  Sleep problems-wakes in the middle of the night, low motivation, isolative, intermittent depressed mood, anxiety, struggles with daily hygiene (not showering, brushing teeth etc), self depreciating thoughts  Risk Assessment: Danger to Self:  No Self-injurious Behavior: No Danger to Others: No Duty to Warn:no Physical Aggression / Violence:No  Access to Firearms a concern: No  Gang Involvement:No  Patient / guardian was educated about steps to take if suicide or homicide risk level increases between visits: yes While future psychiatric events cannot be accurately predicted, the patient does not currently require acute inpatient psychiatric care and does not currently meet [facility] involuntary commitment criteria.    Medications: Current Outpatient Medications  Medication Sig Dispense Refill   Ascorbic Acid (VITAMIN C) 1000 MG tablet Take 2,000 mg by mouth daily.     buPROPion (WELLBUTRIN XL) 300 MG 24 hr tablet Take 1 tablet (300 mg total) by mouth daily. 30 tablet 2   cholecalciferol (VITAMIN D3) 25 MCG (1000 UNIT) tablet Take 2,000 Units by mouth daily.     lamoTRIgine (LAMICTAL) 150 MG tablet Take 1 tablet (150 mg total) by mouth at bedtime. 30 tablet 2   loratadine (CLARITIN) 10 MG tablet Take 1 tablet (10 mg total) by  mouth daily. 30 tablet 0   mirtazapine (REMERON) 15 MG tablet Take 1/2 tablet at bedtime for 7 nights, then take one tablet at bedtime. 30 tablet 5   Multiple Vitamin (MULTIVITAMIN) capsule Take 1 capsule by mouth daily.     omega-3 acid ethyl esters (LOVAZA) 1 g capsule Take 1 capsule (1 g total) by mouth 2 (two) times daily. 60 capsule 0   Omega-3 Fatty Acids (FISH OIL) 1000 MG CAPS Take 1,000 mg by mouth daily.     perphenazine (TRILAFON) 2 MG tablet Take 1 tablet (2 mg) by mouth with a 4 mg tablet twice a day (total of 6 mg bid) 60 tablet 2   perphenazine (TRILAFON) 4 MG tablet Take 1 tablet (4 mg) by mouth with a 2 mg tablet twice a day. (6 mg bid) 60 tablet 2   No current facility-administered medications for this visit.    Subjective: Patient presents for session on time.  Patient shared progress, recent events.  He stated that he is having difficulty sleeping, averaging about 4 hours per night.  He stated that he was taking his medication for sleep as prescribed but discontinued about a week or so ago due to lack of effectiveness.  He has an appointment with Yvette Rack tomorrow for medication management and plans to discuss further.  He shared other recent family issues, how his brother continues to struggle to find employment, that his parents who have been paying his rent, are planning  to discontinue the payments soon.  Patient shared his own struggle in finding employment, considers medical coding however, stated that his parents would rather him choose a different profession for more job security and money.  He stated that regardless, he plans to pursue this certification period as a way to cope and care for himself, he plans to start working out, reports procrastination and low motivation to start.  We discussed some CBT interventions, "start behaviors" to follow through with between sessions.     Interventions: CBT, supportive therapy, problem solving strategies  Diagnoses:     ICD-10-CM   1. GAD (generalized anxiety disorder)  F41.1           Plan: Patient is to use CBT, mindfulness and coping skills to help manage decrease symptoms associated with their diagnosis.  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  Long-term goal:   Reduce overall level, frequency, and intensity of the feelings of depression and anxiety 7-8/10 to a 0-2/10 in severity for at least 3 consecutive months.   Short-term goal:  Decrease "deflating, self-doubting" and catastrophizing thinking style Decrease anxiety producing self talk such as thinking of the worse possible life outcome Decrease ruminating, which can affect his sleep and increased emotional distress Continue to take medications as prescribed Increased self-confidence by engaging in an exercise regimen, working out Increase steps toward independence by finding employment and eventually moving out of this parent's home    Assessment of progress:  progressing  [name] Session, [facility]

## 2021-07-17 ENCOUNTER — Encounter: Payer: Self-pay | Admitting: Adult Health

## 2021-07-17 ENCOUNTER — Ambulatory Visit (INDEPENDENT_AMBULATORY_CARE_PROVIDER_SITE_OTHER): Payer: 59 | Admitting: Adult Health

## 2021-07-17 DIAGNOSIS — G4700 Insomnia, unspecified: Secondary | ICD-10-CM

## 2021-07-17 DIAGNOSIS — F22 Delusional disorders: Secondary | ICD-10-CM | POA: Diagnosis not present

## 2021-07-17 DIAGNOSIS — F331 Major depressive disorder, recurrent, moderate: Secondary | ICD-10-CM

## 2021-07-17 DIAGNOSIS — F900 Attention-deficit hyperactivity disorder, predominantly inattentive type: Secondary | ICD-10-CM

## 2021-07-17 DIAGNOSIS — F411 Generalized anxiety disorder: Secondary | ICD-10-CM | POA: Diagnosis not present

## 2021-07-17 NOTE — Progress Notes (Signed)
Adam Larsen [id number] 02-28-1986 35 y.o.  Subjective:   Patient ID:  Adam Krantz. is a 35 y.o. (DOB [birth year]) male.  Chief Complaint: No chief complaint on file.   HPI Adam Larsen. presents to the office today for follow-up of MDD, GAD, insomnia, paranoia, and ADHD.  Describes mood today as "ok". Pleasant. Mood symptoms - denies depression, anxiety, and irritability. Mood remains consistent. Stating "I feel pretty neutral". Considering getting a job or going back to school.  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 Has not worked in 4 years - does not receive disability benefits. Reports ongoing issues with sleep and is requesting a different sleep aid. Seeing [name] for therapy. Improved interest and motivation. Taking medications as prescribed.  Energy levels "somewhat low" Active, does not have a regular exercise routine. Walking on cool days. Enjoys some usual interests and activities. Single. Lives with parents. Spending time with family.  Appetite adequate eating 2 to 3 times a day. Weight stable - 205 pounds. Sleeps better some nights than others. Able to fall asleep. Averages - 1 to 2 or 5 to 6 hours of broken sleep. Napping during the day 1 to 3 hours.  Focus and concentration difficulties. Diagnosed with ADHD in childhood. Completing tasks around the house - "parents give me odd jobs to do". Managing aspects of household.  Denies SI or HI.   Denies AH or VH.  Denies paranoia.    PHQ2-9    Flowsheet Row ED from 05/16/2020 in [facility] EMERGENCY DEPARTMENT  PHQ-2 Total Score 6  PHQ-9 Total Score 17      Flowsheet Row ED from 04/19/2020 in [facility] EMERGENCY DEPARTMENT  C-SSRS RISK CATEGORY Error: Q2 is Yes, you must answer 3, 4, and 5        Review of Systems:  Review of Systems  Musculoskeletal:  Negative for gait problem.  Neurological:  Negative for tremors.   Psychiatric/Behavioral:         Please refer to HPI   Medications: I have reviewed the patient's current medications.  Current Outpatient Medications  Medication Sig Dispense Refill   Ascorbic Acid (VITAMIN C) 1000 MG tablet Take 2,000 mg by mouth daily.     buPROPion (WELLBUTRIN XL) 300 MG 24 hr tablet Take 1 tablet (300 mg total) by mouth daily. 30 tablet 2   cholecalciferol (VITAMIN D3) 25 MCG (1000 UNIT) tablet Take 2,000 Units by mouth daily.     lamoTRIgine (LAMICTAL) 150 MG tablet Take 1 tablet (150 mg total) by mouth at bedtime. 30 tablet 2   loratadine (CLARITIN) 10 MG tablet Take 1 tablet (10 mg total) by mouth daily. 30 tablet 0   mirtazapine (REMERON) 15 MG tablet Take 1/2 tablet at bedtime for 7 nights, then take one tablet at bedtime. 30 tablet 5   Multiple Vitamin (MULTIVITAMIN) capsule Take 1 capsule by mouth daily.     omega-3 acid ethyl esters (LOVAZA) 1 g capsule Take 1 capsule (1 g total) by mouth 2 (two) times daily. 60 capsule 0   Omega-3 Fatty Acids (FISH OIL) 1000 MG CAPS Take 1,000 mg by mouth daily.     perphenazine (TRILAFON) 2 MG tablet Take 1 tablet (2 mg) by mouth with a 4 mg tablet twice a day (total of 6 mg bid) 60 tablet 2   perphenazine (TRILAFON) 4 MG tablet Take 1 tablet (4 mg) by mouth with a 2 mg  tablet twice a day. (6 mg bid) 60 tablet 2   No current facility-administered medications for this visit.    Medication Side Effects: None  Allergies: No Known Allergies  Past Medical History:  Diagnosis Date   Anxiety    Bipolar 1 disorder (HCC)    Depression    Mania (HCC)    Suicidal ideations     Past Medical History, Surgical history, Social history, and Family history were reviewed and updated as appropriate.   Please see review of systems for further details on the patient's review from today.   Objective:   Physical Exam:  There were no vitals taken for this visit.  Physical Exam Constitutional:      General: He is not in acute  distress. Musculoskeletal:        General: No deformity.  Neurological:     Mental Status: He is alert and oriented to person, place, and time.     Coordination: Coordination normal.  Psychiatric:        Attention and Perception: Attention and perception normal. He does not perceive auditory or visual hallucinations.        Mood and Affect: Mood normal. Mood is not anxious or depressed. Affect is not labile, blunt, angry or inappropriate.        Speech: Speech normal.        Behavior: Behavior normal.        Thought Content: Thought content normal. Thought content is not paranoid or delusional. Thought content does not include homicidal or suicidal ideation. Thought content does not include homicidal or suicidal plan.        Cognition and Memory: Cognition and memory normal.        Judgment: Judgment normal.     Comments: Insight intact    Lab Review:     Component Value Date/Time   NA 140 05/16/2020 0912   K 3.6 05/16/2020 0912   CL 105 05/16/2020 0912   CO2 24 05/16/2020 0912   GLUCOSE 202 (H) 05/16/2020 0912   BUN 11 05/16/2020 0912   CREATININE 1.05 05/16/2020 0912   CALCIUM 9.6 05/16/2020 0912   PROT 7.5 05/16/2020 0912   ALBUMIN 4.3 05/16/2020 0912   AST 20 05/16/2020 0912   ALT 27 05/16/2020 0912   ALKPHOS 73 05/16/2020 0912   BILITOT 0.7 05/16/2020 0912   GFRNONAA >60 05/16/2020 0912   GFRAA >60 05/16/2020 0912       Component Value Date/Time   WBC 12.5 (H) 05/16/2020 0912   RBC 5.12 05/16/2020 0912   HGB 15.3 05/16/2020 0912   HCT 45.6 05/16/2020 0912   PLT 392 05/16/2020 0912   MCV 89.1 05/16/2020 0912   MCH 29.9 05/16/2020 0912   MCHC 33.6 05/16/2020 0912   RDW 12.7 05/16/2020 0912   LYMPHSABS 2.4 04/19/2020 1252   MONOABS 0.8 04/19/2020 1252   EOSABS 0.0 04/19/2020 1252   BASOSABS 0.1 04/19/2020 1252    No results found for: POCLITH, LITHIUM   No results found for: PHENYTOIN, PHENOBARB, VALPROATE, CBMZ   .res Assessment: Plan:    Plan:  1.  Perphenazine 6mg  in am and 6mg  at bedtime   2. Wellbutrin XL 300mg  daily 3. Lamictal 150mg  hs 4. Remeron 15mg  - one tablet at bedtime.  Patient requesting another sleep aid - does not feel ike Remeron has been effective. Discussed on going issues with sleep. Discussed possible contributing factors - tv, caffeine, napping during the day, sleep environment. Reports napping 1 to 3 hours  during the day. Going to bed at 7 to 8 pm and up around 5 to 6 - broken sleep during the night. Patient advised to discontinue daytime napping and try to get on a more consistent sleep schedule before adding another sleep aid. Will monitor progress over the next month and report findings at next visit.  RTC 4 weeks  Patient advised to contact office with any questions, adverse effects, or acute worsening in signs and symptoms.  Discussed potential metabolic side effects associated with atypical antipsychotics, as well as potential risk for movement side effects. Advised pt to contact office if movement side effects occur.   Discussed potential benefits, risk, and side effects of benzodiazepines to include potential risk of tolerance and dependence, as well as possible drowsiness.  Advised patient not to drive if experiencing drowsiness and to take lowest possible effective dose to minimize risk of dependence and tolerance.   Diagnoses and all orders for this visit:  GAD (generalized anxiety disorder)  Insomnia, unspecified type  Major depressive disorder, recurrent episode, moderate (HCC)  Paranoia (HCC)  ADHD, predominantly inattentive type    Please see After Visit Summary for patient specific instructions.  Future Appointments  Date Time Provider Department Center  07/31/2021  8:00 AM [name] Session, [facility] CP-CP None  08/13/2021  8:00 AM Keiva Dina, Thereasa Solo, NP CP-CP None  08/14/2021  8:00 AM [name] Session, [facility] CP-CP None  08/28/2021  8:00 AM [name] Session, [facility] CP-CP  None    No orders of the defined types were placed in this encounter.   -------------------------------

## 2021-07-31 ENCOUNTER — Ambulatory Visit (INDEPENDENT_AMBULATORY_CARE_PROVIDER_SITE_OTHER): Payer: 59 | Admitting: Mental Health

## 2021-07-31 ENCOUNTER — Other Ambulatory Visit: Payer: Self-pay

## 2021-07-31 DIAGNOSIS — F411 Generalized anxiety disorder: Secondary | ICD-10-CM | POA: Diagnosis not present

## 2021-07-31 NOTE — Progress Notes (Signed)
[facility]  Name: Girard Koontz. Date: 07/31/21 MRN: [id number]   DOB: [birth year] PCP: Patient, No Pcp Per (Inactive)  Time spent: 50 minutes  Treatment:  Individual therapy  Mental Status Exam:    Appearance:   Casual     Behavior:  Appropriate  Motor:  Normal  Speech/Language:   Clear and Coherent  Affect:  full range  Mood:  depressed and pleasant  Thought process:  normal  Thought content:    WNL  Sensory/Perceptual disturbances:    none  Orientation:  x4  Attention:  Good  Concentration:  Good  Memory:  WNL  Fund of knowledge:   Good  Insight:    Good  Judgment:   Good  Impulse Control:  Good   Reported Symptoms:  Sleep problems-wakes in the middle of the night, low motivation, isolative, intermittent depressed mood, anxiety, struggles with daily hygiene (not showering, brushing teeth etc), self depreciating thoughts  Risk Assessment: Danger to Self:  No Self-injurious Behavior: No Danger to Others: No Duty to Warn:no Physical Aggression / Violence:No  Access to Firearms a concern: No  Gang Involvement:No  Patient / guardian was educated about steps to take if suicide or homicide risk level increases between visits: yes While future psychiatric events cannot be accurately predicted, the patient does not currently require acute inpatient psychiatric care and does not currently meet [facility] involuntary commitment criteria.    Medications: Current Outpatient Medications  Medication Sig Dispense Refill   Ascorbic Acid (VITAMIN C) 1000 MG tablet Take 2,000 mg by mouth daily.     buPROPion (WELLBUTRIN XL) 300 MG 24 hr tablet Take 1 tablet (300 mg total) by mouth daily. 30 tablet 2   cholecalciferol (VITAMIN D3) 25 MCG (1000 UNIT) tablet Take 2,000 Units by mouth daily.     lamoTRIgine (LAMICTAL) 150 MG tablet Take 1 tablet (150 mg total) by mouth at bedtime. 30 tablet 2   loratadine (CLARITIN) 10 MG tablet Take 1 tablet (10 mg total) by  mouth daily. 30 tablet 0   mirtazapine (REMERON) 15 MG tablet Take 1/2 tablet at bedtime for 7 nights, then take one tablet at bedtime. 30 tablet 5   Multiple Vitamin (MULTIVITAMIN) capsule Take 1 capsule by mouth daily.     omega-3 acid ethyl esters (LOVAZA) 1 g capsule Take 1 capsule (1 g total) by mouth 2 (two) times daily. 60 capsule 0   Omega-3 Fatty Acids (FISH OIL) 1000 MG CAPS Take 1,000 mg by mouth daily.     perphenazine (TRILAFON) 2 MG tablet Take 1 tablet (2 mg) by mouth with a 4 mg tablet twice a day (total of 6 mg bid) 60 tablet 2   perphenazine (TRILAFON) 4 MG tablet Take 1 tablet (4 mg) by mouth with a 2 mg tablet twice a day. (6 mg bid) 60 tablet 2   No current facility-administered medications for this visit.    Subjective: Patient presents for session on time.  Patient shared some recent changes, how he has discontinued playing his video games, put away his gaming computer to avoid playing.  Patient went on to share his rationale for discontinuing video games, "I feel like I am just wasting my time with it".  Patient went on to share how he wants to have more experiences that he felt will "better myself".  He stated that he has also recently joined the gym and plans to work out.  Ways to maintain a level of consistency were discussed as  he identified wanting to be consistent with working out.  He stated he continues to struggle with sleep, has tried to stay awake during the day and avoid napping, however, states that he struggles to get enough sleep at night.  We reviewed sleep hygiene coping skills, explored collaboratively.  He hopes that the exercise will also assist in his being able to get more rest.  He recently discontinued his medications, stating it was about 3 weeks ago.  He stated as a result he feels that he can think more clearly, and hopes that he will have less fatigue and low motivation throughout the day.  Due to patient's history, cautioned patient about taking this  step, encouraged him to further discuss with Yvette Rack, NP as well as made him aware of after hours availability via telephone at our practice with any questions or concerns he may have between sessions.  Interventions: CBT, supportive therapy, problem solving strategies  Diagnoses:    ICD-10-CM   1. GAD (generalized anxiety disorder)  F41.1         Plan: Patient is to use CBT, mindfulness and coping skills to help manage decrease symptoms associated with their diagnosis.  Patient to look for employment to obtain independence.   Long-term goal:   Reduce overall level, frequency, and intensity of the feelings of depression and anxiety 7-8/10 to a 0-2/10 in severity for at least 3 consecutive months.   Short-term goal:  Decrease anxiety producing self talk such as thinking of the worse possible life outcome Decrease ruminating, which can affect his sleep and increased emotional distress Continue to take medications as prescribed Increased self-confidence by engaging in an exercise regimen, working out Increase steps toward independence by finding employment and eventually moving out of this parent's home    Assessment of progress:  progressing  [name] Session, [facility]

## 2021-08-13 ENCOUNTER — Ambulatory Visit: Payer: 59 | Admitting: Adult Health

## 2021-08-13 NOTE — Progress Notes (Signed)
Patient no show appointment. ? ?

## 2021-08-14 ENCOUNTER — Ambulatory Visit (INDEPENDENT_AMBULATORY_CARE_PROVIDER_SITE_OTHER): Payer: 59 | Admitting: Mental Health

## 2021-08-14 ENCOUNTER — Other Ambulatory Visit: Payer: Self-pay

## 2021-08-14 DIAGNOSIS — F411 Generalized anxiety disorder: Secondary | ICD-10-CM | POA: Diagnosis not present

## 2021-08-14 NOTE — Progress Notes (Signed)
[facility]  Name: Briggs Edelen. Date: 08/15/21 MRN: [id number]   DOB: May 15, 1986 PCP: Patient, No Pcp Per (Inactive)  Time spent: 51 minutes  Treatment:  Individual therapy  Mental Status Exam:    Appearance:   Casual     Behavior:  Appropriate  Motor:  Normal  Speech/Language:   Clear and Coherent  Affect:  full range  Mood:  depressed and pleasant  Thought process:  normal  Thought content:    WNL  Sensory/Perceptual disturbances:    none  Orientation:  x4  Attention:  Good  Concentration:  Good  Memory:  WNL  Fund of knowledge:   Good  Insight:    Good  Judgment:   Good  Impulse Control:  Good   Reported Symptoms:  Sleep problems-wakes in the middle of the night, low motivation, isolative, intermittent depressed mood, anxiety, struggles with daily hygiene (not showering, brushing teeth etc), self depreciating thoughts  Risk Assessment: Danger to Self:  No Self-injurious Behavior: No Danger to Others: No Duty to Warn:no Physical Aggression / Violence:No  Access to Firearms a concern: No  Gang Involvement:No  Patient / guardian was educated about steps to take if suicide or homicide risk level increases between visits: yes While future psychiatric events cannot be accurately predicted, the patient does not currently require acute inpatient psychiatric care and does not currently meet [facility] involuntary commitment criteria.    Medications: Current Outpatient Medications  Medication Sig Dispense Refill   Ascorbic Acid (VITAMIN C) 1000 MG tablet Take 2,000 mg by mouth daily.     buPROPion (WELLBUTRIN XL) 300 MG 24 hr tablet Take 1 tablet (300 mg total) by mouth daily. 30 tablet 2   cholecalciferol (VITAMIN D3) 25 MCG (1000 UNIT) tablet Take 2,000 Units by mouth daily.     lamoTRIgine (LAMICTAL) 150 MG tablet Take 1 tablet (150 mg total) by mouth at bedtime. 30 tablet 2   loratadine (CLARITIN) 10 MG tablet Take 1 tablet (10 mg total) by  mouth daily. 30 tablet 0   mirtazapine (REMERON) 15 MG tablet Take 1/2 tablet at bedtime for 7 nights, then take one tablet at bedtime. 30 tablet 5   Multiple Vitamin (MULTIVITAMIN) capsule Take 1 capsule by mouth daily.     omega-3 acid ethyl esters (LOVAZA) 1 g capsule Take 1 capsule (1 g total) by mouth 2 (two) times daily. 60 capsule 0   Omega-3 Fatty Acids (FISH OIL) 1000 MG CAPS Take 1,000 mg by mouth daily.     perphenazine (TRILAFON) 2 MG tablet Take 1 tablet (2 mg) by mouth with a 4 mg tablet twice a day (total of 6 mg bid) 60 tablet 2   perphenazine (TRILAFON) 4 MG tablet Take 1 tablet (4 mg) by mouth with a 2 mg tablet twice a day. (6 mg bid) 60 tablet 2   No current facility-administered medications for this visit.    Subjective: Patient presents for session on time.  He shared progress.  He continues to deal with low motivation, states he is struggling to look for a job and has not worked out recently.  He stated that he has felt more sad recently, feels that it may be the result of getting off his medications which was about a month and a half ago at this point.  He reports his uncle has been visiting which has been frustrating.  Patient considers himself an introvert and having family come over most days throughout the week has been  frustrating.  Facilitated his identifying needs where he continues to verbalize wanting to be more independent, eventually have his own place "I am 33 most 27 year olds to have their own house and things by now".  Facilitated his identifying thoughts and feelings toward taking steps toward achieving some independence.  Interventions: CBT, supportive therapy, problem solving strategies  Diagnoses:    ICD-10-CM   1. GAD (generalized anxiety disorder)  F41.1          Plan: Patient is to use CBT, mindfulness and coping skills to help manage decrease symptoms associated with their diagnosis.  Patient to look for employment to obtain independence.    Long-term goal:   Reduce overall level, frequency, and intensity of the feelings of depression and anxiety 7-8/10 to a 0-2/10 in severity for at least 3 consecutive months.   Short-term goal:  Decrease anxiety producing self talk such as thinking of the worse possible life outcome Decrease ruminating, which can affect his sleep and increased emotional distress Continue to take medications as prescribed Increased self-confidence by engaging in an exercise regimen, working out Increase steps toward independence by finding employment and eventually moving out of this parent's home    Assessment of progress:  progressing  [name] Session, [facility]

## 2021-08-28 ENCOUNTER — Ambulatory Visit (INDEPENDENT_AMBULATORY_CARE_PROVIDER_SITE_OTHER): Payer: 59 | Admitting: Mental Health

## 2021-08-28 ENCOUNTER — Other Ambulatory Visit: Payer: Self-pay

## 2021-08-28 DIAGNOSIS — F411 Generalized anxiety disorder: Secondary | ICD-10-CM | POA: Diagnosis not present

## 2021-08-28 NOTE — Progress Notes (Signed)
[facility]  Name: Primus Gritton. Date: 08/28/21 MRN: [id number]   DOB: [birth year] PCP: Patient, No Pcp Per (Inactive)  Time spent: 52 minutes  Treatment:  Individual therapy  Mental Status Exam:    Appearance:   Casual     Behavior:  Appropriate  Motor:  Normal  Speech/Language:   Clear and Coherent  Affect:  full range  Mood:  depressed and pleasant  Thought process:  normal  Thought content:    WNL  Sensory/Perceptual disturbances:    none  Orientation:  x4  Attention:  Good  Concentration:  Good  Memory:  WNL  Fund of knowledge:   Good  Insight:    Good  Judgment:   Good  Impulse Control:  Good   Reported Symptoms:  Sleep problems-wakes in the middle of the night, low motivation, isolative, intermittent depressed mood, anxiety, struggles with daily hygiene (not showering, brushing teeth etc), self depreciating thoughts  Risk Assessment: Danger to Self:  No Self-injurious Behavior: No Danger to Others: No Duty to Warn:no Physical Aggression / Violence:No  Access to Firearms a concern: No  Gang Involvement:No  Patient / guardian was educated about steps to take if suicide or homicide risk level increases between visits: yes While future psychiatric events cannot be accurately predicted, the patient does not currently require acute inpatient psychiatric care and does not currently meet [facility] involuntary commitment criteria.    Medications: Current Outpatient Medications  Medication Sig Dispense Refill   Ascorbic Acid (VITAMIN C) 1000 MG tablet Take 2,000 mg by mouth daily.     buPROPion (WELLBUTRIN XL) 300 MG 24 hr tablet Take 1 tablet (300 mg total) by mouth daily. 30 tablet 2   cholecalciferol (VITAMIN D3) 25 MCG (1000 UNIT) tablet Take 2,000 Units by mouth daily.     lamoTRIgine (LAMICTAL) 150 MG tablet Take 1 tablet (150 mg total) by mouth at bedtime. 30 tablet 2   loratadine (CLARITIN) 10 MG tablet Take 1 tablet (10 mg total) by  mouth daily. 30 tablet 0   mirtazapine (REMERON) 15 MG tablet Take 1/2 tablet at bedtime for 7 nights, then take one tablet at bedtime. 30 tablet 5   Multiple Vitamin (MULTIVITAMIN) capsule Take 1 capsule by mouth daily.     omega-3 acid ethyl esters (LOVAZA) 1 g capsule Take 1 capsule (1 g total) by mouth 2 (two) times daily. 60 capsule 0   Omega-3 Fatty Acids (FISH OIL) 1000 MG CAPS Take 1,000 mg by mouth daily.     perphenazine (TRILAFON) 2 MG tablet Take 1 tablet (2 mg) by mouth with a 4 mg tablet twice a day (total of 6 mg bid) 60 tablet 2   perphenazine (TRILAFON) 4 MG tablet Take 1 tablet (4 mg) by mouth with a 2 mg tablet twice a day. (6 mg bid) 60 tablet 2   No current facility-administered medications for this visit.    Subjective: Patient presents for session in some reported distress.  He stated that he has had a challenging last couple of days.  He stated that his doctor prescribed him Strattera, and patient feels that this medication has caused him to have increased anxiety and feelings of paranoia.  Patient shared how he has been working to manage his anxiety by trying to rationalize thoughts that may go through his mind, such as thinking others are hacking his computer at home while he is on it.  He stated that he engages in some reality testing, naming objects  around his room which helps ground him when his anxiety gets elevated.  Recommended he contact his prescribing doctor to report concerns.  Patient stated that he plans to do so is following today's session as he is also discontinued taking the Strattera.  He also stated that he is informed his parents who are supportive.  Facilitated patient identifying calming, grounding self talk to utilize between sessions to manage his anxiety.  Invited patient to contact this office between sessions if needed.   Interventions: CBT, supportive therapy, problem solving strategies  Diagnoses:    ICD-10-CM   1. GAD (generalized anxiety  disorder)  F41.1          Plan: Patient is to use CBT, mindfulness and coping skills to help manage decrease symptoms associated with their diagnosis.  Patient to look for employment to obtain independence.   Long-term goal:   Reduce overall level, frequency, and intensity of the feelings of depression and anxiety 7-8/10 to a 0-2/10 in severity for at least 3 consecutive months.   Short-term goal:  Decrease anxiety producing self talk such as thinking of the worse possible life outcome Decrease ruminating, which can affect his sleep and increased emotional distress Continue to take medications as prescribed Increased self-confidence by engaging in an exercise regimen, working out Increase steps toward independence by finding employment and eventually moving out of this parent's home    Assessment of progress:  progressing  [name] Session, [facility]

## 2021-10-04 ENCOUNTER — Ambulatory Visit (INDEPENDENT_AMBULATORY_CARE_PROVIDER_SITE_OTHER): Payer: 59 | Admitting: Mental Health

## 2021-10-04 ENCOUNTER — Other Ambulatory Visit: Payer: Self-pay

## 2021-10-04 DIAGNOSIS — F411 Generalized anxiety disorder: Secondary | ICD-10-CM | POA: Diagnosis not present

## 2021-10-04 NOTE — Progress Notes (Addendum)
[facility]  Name: Adam Larsen. Date: 10/04/21 MRN: [id number]   DOB: [birth year] PCP: Patient, No Pcp Per (Inactive)  Time spent: 51 minutes  Treatment:  Individual therapy  Mental Status Exam:    Appearance:   Casual     Behavior:  Appropriate  Motor:  Normal  Speech/Language:   Clear and Coherent  Affect:  full range  Mood:  depressed and pleasant  Thought process:  normal  Thought content:    WNL  Sensory/Perceptual disturbances:    none  Orientation:  x4  Attention:  Good  Concentration:  Good  Memory:  WNL  Fund of knowledge:   Good  Insight:    Good  Judgment:   Good  Impulse Control:  Good   Reported Symptoms:  Sleep problems-wakes in the middle of the night, low motivation, isolative, intermittent depressed mood, anxiety, struggles with daily hygiene (not showering, brushing teeth etc), self depreciating thoughts  Risk Assessment: Danger to Self:  No Self-injurious Behavior: No Danger to Others: No Duty to Warn:no Physical Aggression / Violence:No  Access to Firearms a concern: No  Gang Involvement:No  Patient / guardian was educated about steps to take if suicide or homicide risk level increases between visits: yes While future psychiatric events cannot be accurately predicted, the patient does not currently require acute inpatient psychiatric care and does not currently meet [facility] involuntary commitment criteria.    Medications: Current Outpatient Medications  Medication Sig Dispense Refill   Ascorbic Acid (VITAMIN C) 1000 MG tablet Take 2,000 mg by mouth daily.     buPROPion (WELLBUTRIN XL) 300 MG 24 hr tablet Take 1 tablet (300 mg total) by mouth daily. 30 tablet 2   cholecalciferol (VITAMIN D3) 25 MCG (1000 UNIT) tablet Take 2,000 Units by mouth daily.     lamoTRIgine (LAMICTAL) 150 MG tablet Take 1 tablet (150 mg total) by mouth at bedtime. 30 tablet 2   loratadine (CLARITIN) 10 MG tablet Take 1 tablet (10 mg total) by  mouth daily. 30 tablet 0   mirtazapine (REMERON) 15 MG tablet Take 1/2 tablet at bedtime for 7 nights, then take one tablet at bedtime. 30 tablet 5   Multiple Vitamin (MULTIVITAMIN) capsule Take 1 capsule by mouth daily.     omega-3 acid ethyl esters (LOVAZA) 1 g capsule Take 1 capsule (1 g total) by mouth 2 (two) times daily. 60 capsule 0   Omega-3 Fatty Acids (FISH OIL) 1000 MG CAPS Take 1,000 mg by mouth daily.     perphenazine (TRILAFON) 2 MG tablet Take 1 tablet (2 mg) by mouth with a 4 mg tablet twice a day (total of 6 mg bid) 60 tablet 2   perphenazine (TRILAFON) 4 MG tablet Take 1 tablet (4 mg) by mouth with a 2 mg tablet twice a day. (6 mg bid) 60 tablet 2   No current facility-administered medications for this visit.    Subjective: Patient presents for session on time.  Patient shared progress, recent events since last visit which was about a month ago.  He shared concerns related to his niece, who is age 19.  He stated they have a close relationship, communicate every few weeks.  He stated that she is considering having gender reassignment.  Patient expresses thoughts and feelings, concerns about her making this decision, how family has reacted to her decision.  Ways to communicate with his niece was explored as patient identified this is a need and has been distressful for him over  the last few weeks as he became aware of her decision.  Family relationships with his mother, father were assessed.  Stated that he and his father got into an argument about a week ago, how his father did not speak to him for a few days which he states is typical.  He stated that his father was reactive to patient having a different opinion.  He stated his father can often have arguments with various family members resulting in similar outcomes in terms of his withdrawing and not communicating.  Facilitated patient identifying and sharing ways he is coping, caring for himself, discussing some past session content  related to his taking steps toward working out, exercising in an attempt to increase motivation and to improve sleep and feel better.  Patient stated that he has not followed through with working out, was able to identify thoughts associated that provide to be barriers in him taking the steps such as "what if people see me and think I am weak because I have not been working out".  We reviewed some past session content related to being aware of when he is having these types of self depreciating thoughts and to remind himself of what he knows to be true where he was able to identify in session or self supportive rational thoughts.  Interventions: CBT, supportive therapy, problem solving strategies  Diagnoses:    ICD-10-CM   1. GAD (generalized anxiety disorder)  F41.1           Plan: Patient is to use CBT, mindfulness and coping skills to help manage decrease symptoms associated with their diagnosis.  Patient to look for employment to obtain independence.   Long-term goal:   Reduce overall level, frequency, and intensity of the feelings of depression and anxiety 7-8/10 to a 0-2/10 in severity for at least 3 consecutive months.   Short-term goal:  Decrease anxiety producing self talk such as thinking of the worse possible life outcome Decrease ruminating, which can affect his sleep and increased emotional distress Continue to take medications as prescribed Increased self-confidence by engaging in an exercise regimen, working out Increase steps toward independence by finding employment and eventually moving out of this parent's home    Assessment of progress:  progressing  [name] Session, [facility]

## 2021-10-17 ENCOUNTER — Ambulatory Visit: Payer: 59 | Admitting: Mental Health

## 2021-11-14 ENCOUNTER — Encounter: Payer: Self-pay | Admitting: Adult Health

## 2021-11-14 ENCOUNTER — Other Ambulatory Visit: Payer: Self-pay

## 2021-11-14 ENCOUNTER — Ambulatory Visit (INDEPENDENT_AMBULATORY_CARE_PROVIDER_SITE_OTHER): Payer: 59 | Admitting: Adult Health

## 2021-11-14 DIAGNOSIS — F900 Attention-deficit hyperactivity disorder, predominantly inattentive type: Secondary | ICD-10-CM | POA: Diagnosis not present

## 2021-11-14 DIAGNOSIS — F22 Delusional disorders: Secondary | ICD-10-CM

## 2021-11-14 DIAGNOSIS — F331 Major depressive disorder, recurrent, moderate: Secondary | ICD-10-CM

## 2021-11-14 DIAGNOSIS — F411 Generalized anxiety disorder: Secondary | ICD-10-CM | POA: Diagnosis not present

## 2021-11-14 DIAGNOSIS — G4700 Insomnia, unspecified: Secondary | ICD-10-CM

## 2021-11-14 MED ORDER — AMITRIPTYLINE HCL 50 MG PO TABS: 50.0000 mg | ORAL_TABLET | Freq: Every day | ORAL | 2 refills | Status: DC

## 2021-11-14 MED ORDER — METHYLPHENIDATE HCL 20 MG PO TABS: 20.0000 mg | ORAL_TABLET | Freq: Every day | ORAL | 0 refills | Status: DC

## 2021-11-14 NOTE — Progress Notes (Signed)
Adam CanterburyRichard Lengyel Jr. [id number] 1986-06-24 36 y.o.  Subjective:   Patient ID:  Adam CanterburyRichard Howes Jr. is a 36 y.o. (DOB 1986-06-24) male.  Chief Complaint: No chief complaint on file.   HPI Adam CanterburyRichard Catano Jr. presents to the office today for follow-up of MDD, GAD, insomnia, paranoia, and ADHD.  Describes mood today as "ok". Pleasant. Mood symptoms - denies depression, anxiety, and irritability. Mood is consistent. Stating "I feel pretty stable". Taking current medications as prescribed. Seeing Elio Forgethris Andrews for therapy. Improved interest and motivation. Taking medications as prescribed.  Energy levels low. Active, does not have a regular exercise routine. Walking. Enjoys some usual interests and activities. Single. Lives with parents. Spending time with family.  Appetite adequate. Weight gain 205 to 215 pounds. Sleeps well most nights. Averages 8 hours a night. Focus and concentration difficulties. Diagnosed with ADHD in childhood. Completing tasks around the house. Managing aspects of household.  Denies SI or HI.   Denies AH or VH.  Denies paranoia.  Previous medication: Sheral ApleyStratera, Lamictal,     PHQ2-9    [facility]  PHQ-2 Total Score 6  PHQ-9 Total Score 17      [facility]  C-SSRS RISK CATEGORY Error: Q2 is Yes, you must answer 3, 4, and 5        Review of Systems:  Review of Systems  Musculoskeletal:  Negative for gait problem.  Neurological:  Negative for tremors.  Psychiatric/Behavioral:         Please refer to HPI   Medications: I have reviewed the patient's current medications.  Current Outpatient Medications  Medication Sig Dispense Refill   amitriptyline (ELAVIL) 50 MG tablet Take 1 tablet (50 mg total) by mouth at bedtime. 30 tablet 2   methylphenidate (RITALIN) 20 MG tablet Take 1 tablet (20 mg total) by mouth  daily. 30 tablet 0   Ascorbic Acid (VITAMIN C) 1000 MG tablet Take 2,000 mg by mouth daily.     buPROPion (WELLBUTRIN XL) 300 MG 24 hr tablet Take 1 tablet (300 mg total) by mouth daily. 30 tablet 2   cholecalciferol (VITAMIN D3) 25 MCG (1000 UNIT) tablet Take 2,000 Units by mouth daily.     loratadine (CLARITIN) 10 MG tablet Take 1 tablet (10 mg total) by mouth daily. 30 tablet 0   Multiple Vitamin (MULTIVITAMIN) capsule Take 1 capsule by mouth daily.     omega-3 acid ethyl esters (LOVAZA) 1 g capsule Take 1 capsule (1 g total) by mouth 2 (two) times daily. 60 capsule 0   Omega-3 Fatty Acids (FISH OIL) 1000 MG CAPS Take 1,000 mg by mouth daily.     perphenazine (TRILAFON) 2 MG tablet Take 1 tablet (2 mg) by mouth with a 4 mg tablet twice a day (total of 6 mg bid) 60 tablet 2   perphenazine (TRILAFON) 4 MG tablet Take 1 tablet (4 mg) by mouth with a 2 mg tablet twice a day. (6 mg bid) 60 tablet 2   No current facility-administered medications for this visit.    Medication Side Effects: None  Allergies: No Known Allergies  Past Medical History:  Diagnosis Date   Anxiety    Bipolar 1 disorder (HCC)    Depression    Mania (HCC)    Suicidal ideations     Past Medical History, Surgical history, Social history, and Family history were reviewed and updated as appropriate.  Please see review of systems for further details on the patient's review from today.   Objective:   Physical Exam:  There were no vitals taken for this visit.  Physical Exam Constitutional:      General: He is not in acute distress. Musculoskeletal:        General: No deformity.  Neurological:     Mental Status: He is alert and oriented to person, place, and time.     Coordination: Coordination normal.  Psychiatric:        Attention and Perception: Attention and perception normal. He does not perceive auditory or visual hallucinations.        Mood and Affect: Mood normal. Mood is not anxious or depressed.  Affect is not labile, blunt, angry or inappropriate.        Speech: Speech normal.        Behavior: Behavior normal.        Thought Content: Thought content normal. Thought content is not paranoid or delusional. Thought content does not include homicidal or suicidal ideation. Thought content does not include homicidal or suicidal plan.        Cognition and Memory: Cognition and memory normal.        Judgment: Judgment normal.     Comments: Insight intact    Lab Review:     Component Value Date/Time   NA 140 05/16/2020 0912   K 3.6 05/16/2020 0912   CL 105 05/16/2020 0912   CO2 24 05/16/2020 0912   GLUCOSE 202 (H) 05/16/2020 0912   BUN 11 05/16/2020 0912   CREATININE 1.05 05/16/2020 0912   CALCIUM 9.6 05/16/2020 0912   PROT 7.5 05/16/2020 0912   ALBUMIN 4.3 05/16/2020 0912   AST 20 05/16/2020 0912   ALT 27 05/16/2020 0912   ALKPHOS 73 05/16/2020 0912   BILITOT 0.7 05/16/2020 0912   GFRNONAA >60 05/16/2020 0912   GFRAA >60 05/16/2020 0912       Component Value Date/Time   WBC 12.5 (H) 05/16/2020 0912   RBC 5.12 05/16/2020 0912   HGB 15.3 05/16/2020 0912   HCT 45.6 05/16/2020 0912   PLT 392 05/16/2020 0912   MCV 89.1 05/16/2020 0912   MCH 29.9 05/16/2020 0912   MCHC 33.6 05/16/2020 0912   RDW 12.7 05/16/2020 0912   LYMPHSABS 2.4 04/19/2020 1252   MONOABS 0.8 04/19/2020 1252   EOSABS 0.0 04/19/2020 1252   BASOSABS 0.1 04/19/2020 1252    No results found for: POCLITH, LITHIUM   No results found for: PHENYTOIN, PHENOBARB, VALPROATE, CBMZ   .res Assessment: Plan:    Plan:  1. Perphenazine 6mg  in am and 6mg  at bedtime   2. Wellbutrin XL 300mg  daily 3. Amitriptyline 50mg  at hs - one tablet at bedtime. 4. Add Ritalin 20mg  every morning  Request records from [facility].  RTC 4 weeks  Discussed potential benefits, risks, and side effects of stimulants with patient to include increased heart rate, palpitations, insomnia, increased anxiety, increased  irritability, or decreased appetite. Also discussed history of paranoia and to watch for any change in moods. Will also discuss changes with parents. Instructed patient to contact office if experiencing any significant tolerability issues.   Patient advised to contact office with any questions, adverse effects, or acute worsening in signs and symptoms.  Discussed potential metabolic side effects associated with atypical antipsychotics, as well as potential risk for movement side effects. Advised pt to contact office if movement side effects occur.    Diagnoses and all  orders for this visit:  GAD (generalized anxiety disorder)  Insomnia, unspecified type -     amitriptyline (ELAVIL) 50 MG tablet; Take 1 tablet (50 mg total) by mouth at bedtime.  ADHD, predominantly inattentive type -     methylphenidate (RITALIN) 20 MG tablet; Take 1 tablet (20 mg total) by mouth daily.  Paranoia (HCC)  Major depressive disorder, recurrent episode, moderate (HCC)     Please see After Visit Summary for patient specific instructions.  Future Appointments  Date Time Provider Department Center  11/15/2021  9:00 AM [name] Session, [facility] None  12/12/2021  9:00 AM Imer Foxworth, Thereasa Solo, NP CP-CP None    No orders of the defined types were placed in this encounter.   -------------------------------

## 2021-11-15 ENCOUNTER — Ambulatory Visit (INDEPENDENT_AMBULATORY_CARE_PROVIDER_SITE_OTHER): Payer: 59 | Admitting: Mental Health

## 2021-11-15 DIAGNOSIS — F411 Generalized anxiety disorder: Secondary | ICD-10-CM | POA: Diagnosis not present

## 2021-11-15 NOTE — Progress Notes (Signed)
[facility]  Name: Adam Larsen. Date: 11/15/21 MRN: [id number]   DOB: [birth year] PCP: Patient, No Pcp Per (Inactive)  Time spent: 52 minutes  Treatment:  Individual therapy  Mental Status Exam:    Appearance:   Casual     Behavior:  Appropriate  Motor:  Normal  Speech/Language:   Clear and Coherent  Affect:  full range  Mood:  depressed and pleasant  Thought process:  normal  Thought content:    WNL  Sensory/Perceptual disturbances:    none  Orientation:  x4  Attention:  Good  Concentration:  Good  Memory:  WNL  Fund of knowledge:   Good  Insight:    Good  Judgment:   Good  Impulse Control:  Good   Reported Symptoms:  Sleep problems-wakes in the middle of the night, low motivation, isolative, intermittent depressed mood, anxiety, struggles with daily hygiene (not showering, brushing teeth etc), self depreciating thoughts  Risk Assessment: Danger to Self:  No Self-injurious Behavior: No Danger to Others: No Duty to Warn:no Physical Aggression / Violence:No  Access to Firearms a concern: No  Gang Involvement:No  Patient / guardian was educated about steps to take if suicide or homicide risk level increases between visits: yes While future psychiatric events cannot be accurately predicted, the patient does not currently require acute inpatient psychiatric care and does not currently meet [facility] involuntary commitment criteria.    Medications: Current Outpatient Medications  Medication Sig Dispense Refill   amitriptyline (ELAVIL) 50 MG tablet Take 1 tablet (50 mg total) by mouth at bedtime. 30 tablet 2   Ascorbic Acid (VITAMIN C) 1000 MG tablet Take 2,000 mg by mouth daily.     buPROPion (WELLBUTRIN XL) 300 MG 24 hr tablet Take 1 tablet (300 mg total) by mouth daily. 30 tablet 2   cholecalciferol (VITAMIN D3) 25 MCG (1000 UNIT) tablet Take 2,000 Units by mouth daily.     loratadine (CLARITIN) 10 MG tablet Take 1 tablet (10 mg total) by  mouth daily. 30 tablet 0   methylphenidate (RITALIN) 20 MG tablet Take 1 tablet (20 mg total) by mouth daily. 30 tablet 0   Multiple Vitamin (MULTIVITAMIN) capsule Take 1 capsule by mouth daily.     omega-3 acid ethyl esters (LOVAZA) 1 g capsule Take 1 capsule (1 g total) by mouth 2 (two) times daily. 60 capsule 0   Omega-3 Fatty Acids (FISH OIL) 1000 MG CAPS Take 1,000 mg by mouth daily.     perphenazine (TRILAFON) 2 MG tablet Take 1 tablet (2 mg) by mouth with a 4 mg tablet twice a day (total of 6 mg bid) 60 tablet 2   perphenazine (TRILAFON) 4 MG tablet Take 1 tablet (4 mg) by mouth with a 2 mg tablet twice a day. (6 mg bid) 60 tablet 2   No current facility-administered medications for this visit.    Subjective: Patient presents for session on time.  Patient shared progress, recent events since last visit which was about a 2 months ago.  He stated that he struggles with motivation and procrastination.  He stated that he attended his medication management appointment yesterday and was prescribed medication he hopes will assist him with these challenges.  He expresses the desire to look for employment, sharing some potential job ideas.  He shared his concern about being independent, being able to pay all of his bills at some point.  Patient was encouraged to recognize taking meaningful steps daily looking for employment, scheduling  his day to make progress in this area as a primary focus.  He expressed how finding employment, albeit not necessarily a job that he will keep long-term, but still be making progress and potential steps toward the independence that he wants.  He also has concerns about how he will handle interview questions given he has not worked over the past several years, some shared in session also, refocus patient on the immediate goals of spending 30 minutes/day to look for a job/s as a primary focus, while being mindful of other thoughts that can cause him to feel stressed or  anxious.   Interventions: CBT, supportive therapy, problem solving strategies  Diagnoses:    ICD-10-CM   1. GAD (generalized anxiety disorder)  F41.1         Plan: Patient is to use CBT, mindfulness and coping skills to help manage decrease symptoms associated with their diagnosis.  Patient to look for employment to obtain independence.   Long-term goal:   Reduce overall level, frequency, and intensity of the feelings of depression and anxiety 7-8/10 to a 0-2/10 in severity for at least 3 consecutive months.   Short-term goal:  Decrease anxiety producing self talk such as thinking of the worse possible life outcome Decrease ruminating, which can affect his sleep and increased emotional distress Continue to take medications as prescribed Increased self-confidence by engaging in an exercise regimen, working out Increase steps toward independence by finding employment and eventually moving out of this parent's home    Assessment of progress:  progressing  [name] Session, [facility]

## 2021-12-05 ENCOUNTER — Ambulatory Visit (INDEPENDENT_AMBULATORY_CARE_PROVIDER_SITE_OTHER): Payer: 59 | Admitting: Mental Health

## 2021-12-05 ENCOUNTER — Other Ambulatory Visit: Payer: Self-pay

## 2021-12-05 DIAGNOSIS — F411 Generalized anxiety disorder: Secondary | ICD-10-CM | POA: Diagnosis not present

## 2021-12-05 IMAGING — DX DG CHEST 1V PORT
1 series · 1 of 1 positions shown · non-contrast
Comparison: None.

CLINICAL DATA: Chest pain

EXAM:
PORTABLE CHEST 1 VIEW

[chest]
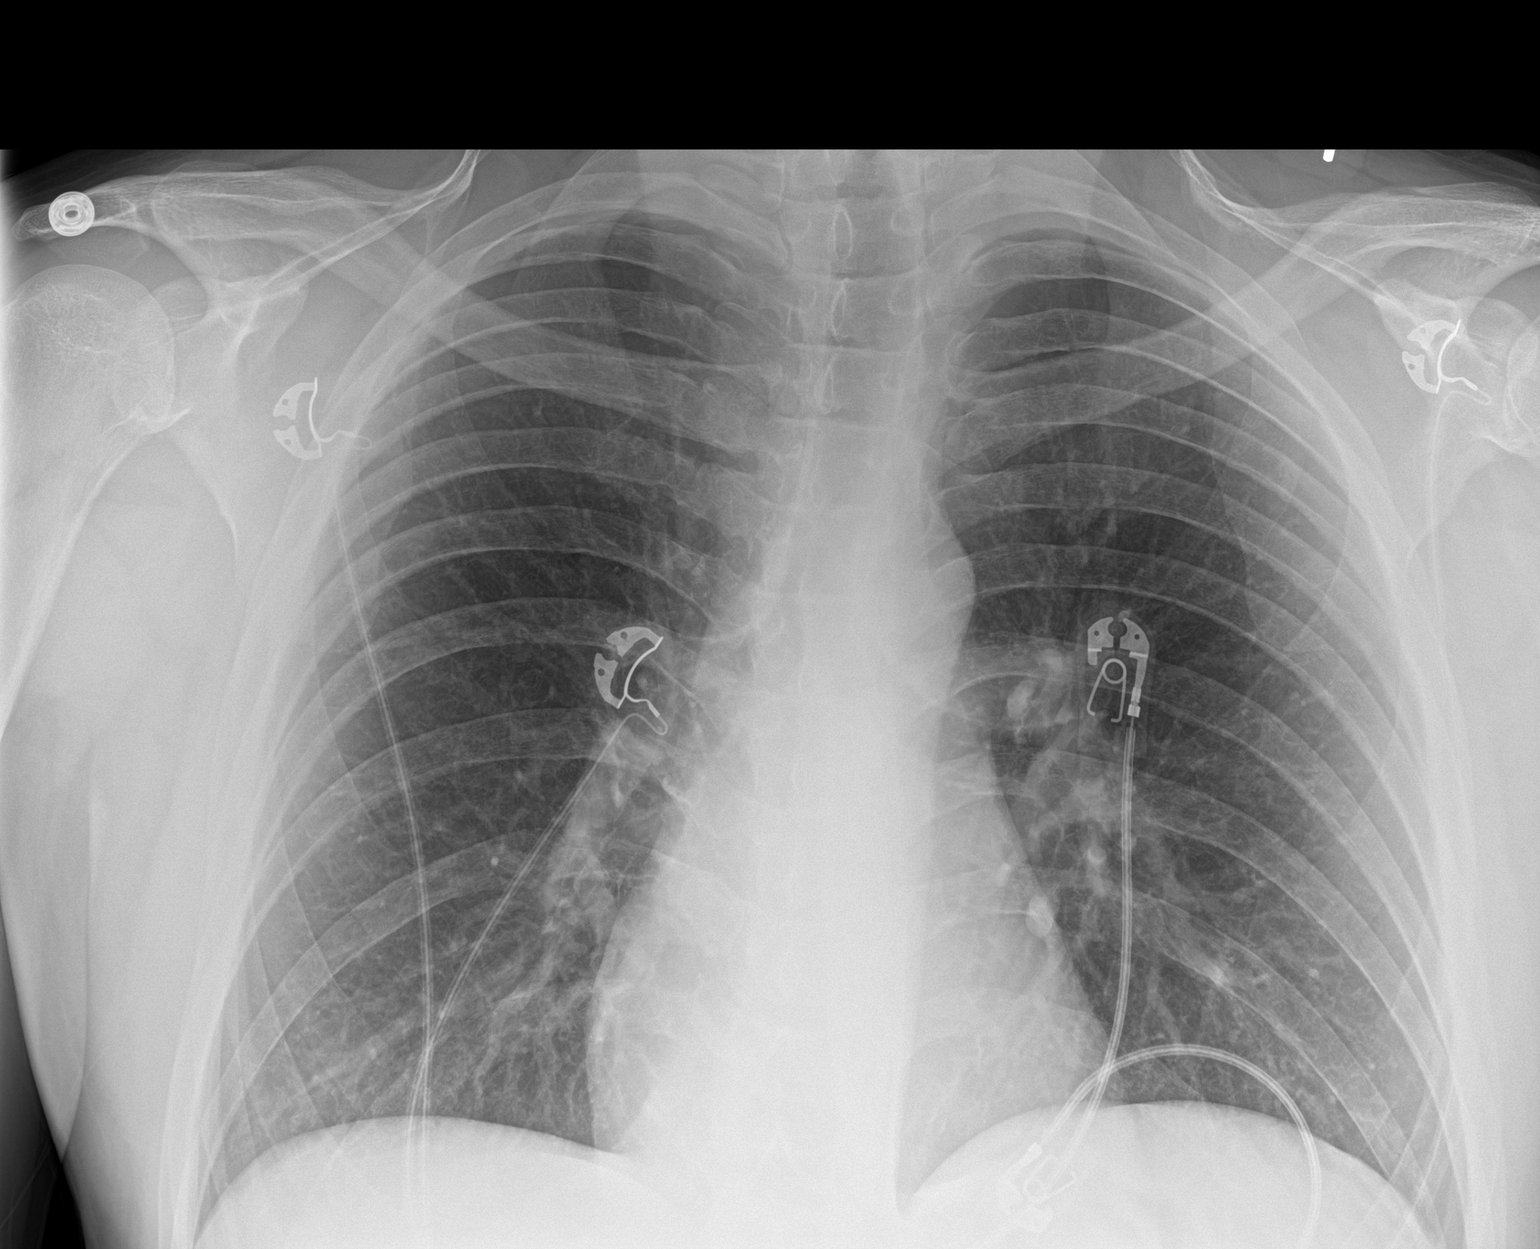

[1 of 1 positions shown; findings below may reference images not displayed]

FINDINGS: The heart size and mediastinal contours are within normal limits.
Both lungs are clear. The visualized skeletal structures are
unremarkable.
IMPRESSION: No active disease.

## 2021-12-05 NOTE — Progress Notes (Signed)
[facility]  Name: Adam Larsen. Date: 12/05/21 MRN: [id number]   DOB: [birth year] PCP: Patient, No Pcp Per (Inactive)  Time spent: 51 minutes  Treatment:  Individual therapy  Mental Status Exam:    Appearance:   Casual     Behavior:  Appropriate  Motor:  Normal  Speech/Language:   Clear and Coherent  Affect:  full range  Mood:  depressed and pleasant  Thought process:  normal  Thought content:    WNL  Sensory/Perceptual disturbances:    none  Orientation:  x4  Attention:  Good  Concentration:  Good  Memory:  WNL  Fund of knowledge:   Good  Insight:    Good  Judgment:   Good  Impulse Control:  Good   Reported Symptoms:  Sleep problems-wakes in the middle of the night, low motivation, isolative, intermittent depressed mood, anxiety, struggles with daily hygiene (not showering, brushing teeth etc), self depreciating thoughts  Risk Assessment: Danger to Self:  No Self-injurious Behavior: No Danger to Others: No Duty to Warn:no Physical Aggression / Violence:No  Access to Firearms a concern: No  Gang Involvement:No  Patient / guardian was educated about steps to take if suicide or homicide risk level increases between visits: yes While future psychiatric events cannot be accurately predicted, the patient does not currently require acute inpatient psychiatric care and does not currently meet [facility] involuntary commitment criteria.    Medications: Current Outpatient Medications  Medication Sig Dispense Refill   amitriptyline (ELAVIL) 50 MG tablet Take 1 tablet (50 mg total) by mouth at bedtime. 30 tablet 2   Ascorbic Acid (VITAMIN C) 1000 MG tablet Take 2,000 mg by mouth daily.     buPROPion (WELLBUTRIN XL) 300 MG 24 hr tablet Take 1 tablet (300 mg total) by mouth daily. 30 tablet 2   cholecalciferol (VITAMIN D3) 25 MCG (1000 UNIT) tablet Take 2,000 Units by mouth daily.     loratadine (CLARITIN) 10 MG tablet Take 1 tablet (10 mg total) by  mouth daily. 30 tablet 0   methylphenidate (RITALIN) 20 MG tablet Take 1 tablet (20 mg total) by mouth daily. 30 tablet 0   Multiple Vitamin (MULTIVITAMIN) capsule Take 1 capsule by mouth daily.     omega-3 acid ethyl esters (LOVAZA) 1 g capsule Take 1 capsule (1 g total) by mouth 2 (two) times daily. 60 capsule 0   Omega-3 Fatty Acids (FISH OIL) 1000 MG CAPS Take 1,000 mg by mouth daily.     perphenazine (TRILAFON) 2 MG tablet Take 1 tablet (2 mg) by mouth with a 4 mg tablet twice a day (total of 6 mg bid) 60 tablet 2   perphenazine (TRILAFON) 4 MG tablet Take 1 tablet (4 mg) by mouth with a 2 mg tablet twice a day. (6 mg bid) 60 tablet 2   No current facility-administered medications for this visit.    Subjective: Patient presents for session on time.  He stated that he started his recent medication prescribed which has been helpful for focus and concentration, motivation.  He stated that it works for about 4 hours and he has been more able to engage in walking exercise, talking with his mom, kayaking on occasions.  He continues to struggle with finding employment as he continues to identify this is a need which will give him more independence eventually.  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  He admits that he struggles to look for jobs, even on his medication with more focus as he wants to do other things he enjoys.  We discussed scheduling 2 to 3 days out of the week to look for jobs while also giving himself time to do activities he enjoys; reviewed how taking small steps can still be meaningful and of value.  He continues to articulate why potential jobs will not be right for him, why he will not be viewed as attractive due to his situation therefore there is no reason to try to even date at  this point.  Assisted him in identifying these beliefs, ways to reframe them were explored collaboratively in session.  Patient identified "My belief system is screwed up".  He is considering getting a dog which he feels may be a catalyst for some change, to get more motivated and have some responsibility, although he continues to identify why he may not be able to be able to do so from a financial standpoint and does not want to rely on his parents; facilitated his articulating what he needs to do then to be ready where he stated he must look for a job and agreed that the idea of applying for 1 in the next 2 weeks seemed reasonable.     Interventions: CBT, supportive therapy, problem solving strategies  Diagnoses:    ICD-10-CM   1. GAD (generalized anxiety disorder)  F41.1          Plan: Patient is to use CBT, mindfulness and coping skills to help manage decrease symptoms associated with their diagnosis.  Patient to look for employment to obtain independence.   Long-term goal:   Reduce overall level, frequency, and intensity of the feelings of depression and anxiety 7-8/10 to a 0-2/10 in severity for at least 3 consecutive months.   Short-term goal:  Decrease anxiety producing self talk such as thinking of the worse possible life outcome Decrease ruminating, which can affect his sleep and increased emotional distress Continue to take medications as prescribed Increased self-confidence by engaging in an exercise regimen, working out Increase steps toward independence by finding employment and eventually moving out of this parent's home    Assessment of progress:  progressing  [place] [place], [facility]

## 2021-12-12 ENCOUNTER — Ambulatory Visit (INDEPENDENT_AMBULATORY_CARE_PROVIDER_SITE_OTHER): Payer: 59 | Admitting: Adult Health

## 2021-12-12 ENCOUNTER — Other Ambulatory Visit: Payer: Self-pay

## 2021-12-12 ENCOUNTER — Encounter: Payer: Self-pay | Admitting: Adult Health

## 2021-12-12 ENCOUNTER — Telehealth: Payer: Self-pay | Admitting: Adult Health

## 2021-12-12 DIAGNOSIS — F3181 Bipolar II disorder: Secondary | ICD-10-CM

## 2021-12-12 DIAGNOSIS — F411 Generalized anxiety disorder: Secondary | ICD-10-CM

## 2021-12-12 DIAGNOSIS — F900 Attention-deficit hyperactivity disorder, predominantly inattentive type: Secondary | ICD-10-CM

## 2021-12-12 DIAGNOSIS — F22 Delusional disorders: Secondary | ICD-10-CM

## 2021-12-12 DIAGNOSIS — G4700 Insomnia, unspecified: Secondary | ICD-10-CM | POA: Diagnosis not present

## 2021-12-12 DIAGNOSIS — F319 Bipolar disorder, unspecified: Secondary | ICD-10-CM

## 2021-12-12 DIAGNOSIS — F331 Major depressive disorder, recurrent, moderate: Secondary | ICD-10-CM

## 2021-12-12 MED ORDER — PERPHENAZINE 2 MG PO TABS: ORAL_TABLET | ORAL | 2 refills | Status: DC

## 2021-12-12 MED ORDER — METHYLPHENIDATE HCL 20 MG PO TABS: 20.0000 mg | ORAL_TABLET | Freq: Every day | ORAL | 0 refills | Status: DC

## 2021-12-12 MED ORDER — PERPHENAZINE 4 MG PO TABS: ORAL_TABLET | ORAL | 2 refills | Status: DC

## 2021-12-12 NOTE — Telephone Encounter (Signed)
Pt called and said that he needs a refill on his perphenazine 2 mg and 4 mg tab. Please send those to [facility] on [address]. He was seen today

## 2021-12-12 NOTE — Telephone Encounter (Signed)
Rx sent 

## 2021-12-12 NOTE — Progress Notes (Signed)
Adam Larsen [id number] [birth year] 36 y.o.  Subjective:   Patient ID:  Adam Larsen. is a 36 y.o. (DOB 10-Nov-1985) male.  Chief Complaint: No chief complaint on file.   HPI Adam Larsen. presents to the office today for follow-up of MDD, GAD, insomnia, paranoia, and ADHD.  Describes mood today as "ok". Pleasant. Mood symptoms - denies depression, anxiety, and irritability. Mood is consistent. Stating "I feel like l'm doing alright". Feels like addition of Ritalin has been helpful. Taking current medications as prescribed. Seeing [name] for therapy. Improved interest and motivation. Taking medications as prescribed.  Energy levels improved. Active, does not have a regular exercise routine. Walking twice a day. Enjoys some usual interests and activities. Single. Lives with parents. Spending time with family.  Appetite adequate. Weight gain 205 to 215 pounds. Sleeps well most nights. Averages 8 hours a night. Focus and concentration improved with addition of Ritalin. Has been able to look for a and apply for a job. Diagnosed with ADHD in childhood - history of taking Ritalin. Completing tasks around the house. Managing aspects of household.  Denies SI or HI.   Denies AH or VH.  Denies paranoia.  Previous medication: [name]    [id number]    Flowsheet Row ED from 05/16/2020 in [facility] EMERGENCY DEPARTMENT  PHQ-2 Total Score 6  PHQ-9 Total Score 17      Flowsheet Row ED from 04/19/2020 in [facility] EMERGENCY DEPARTMENT  C-SSRS RISK CATEGORY Error: Q2 is Yes, you must answer 3, 4, and 5        Review of Systems:  Review of Systems  Musculoskeletal:  Negative for gait problem.  Neurological:  Negative for tremors.  Psychiatric/Behavioral:         Please refer to HPI   Medications: I have reviewed the patient's current medications.  Current Outpatient Medications  Medication Sig Dispense Refill    amitriptyline (ELAVIL) 50 MG tablet Take 1 tablet (50 mg total) by mouth at bedtime. 30 tablet 2   Ascorbic Acid (VITAMIN C) 1000 MG tablet Take 2,000 mg by mouth daily.     buPROPion (WELLBUTRIN XL) 300 MG 24 hr tablet Take 1 tablet (300 mg total) by mouth daily. 30 tablet 2   cholecalciferol (VITAMIN D3) 25 MCG (1000 UNIT) tablet Take 2,000 Units by mouth daily.     loratadine (CLARITIN) 10 MG tablet Take 1 tablet (10 mg total) by mouth daily. 30 tablet 0   methylphenidate (RITALIN) 20 MG tablet Take 1 tablet (20 mg total) by mouth daily. 30 tablet 0   Multiple Vitamin (MULTIVITAMIN) capsule Take 1 capsule by mouth daily.     omega-3 acid ethyl esters (LOVAZA) 1 g capsule Take 1 capsule (1 g total) by mouth 2 (two) times daily. 60 capsule 0   Omega-3 Fatty Acids (FISH OIL) 1000 MG CAPS Take 1,000 mg by mouth daily.     perphenazine (TRILAFON) 2 MG tablet Take 1 tablet (2 mg) by mouth with a 4 mg tablet twice a day (total of 6 mg bid) 60 tablet 2   perphenazine (TRILAFON) 4 MG tablet Take 1 tablet (4 mg) by mouth with a 2 mg tablet twice a day. (6 mg bid) 60 tablet 2   No current facility-administered medications for this visit.    Medication Side Effects: None  Allergies: No Known Allergies  Past Medical History:  Diagnosis Date   Anxiety    Bipolar 1 disorder (HCC)  Depression    Mania (HCC)    Suicidal ideations     Past Medical History, Surgical history, Social history, and Family history were reviewed and updated as appropriate.   Please see review of systems for further details on the patient's review from today.   Objective:   Physical Exam:  There were no vitals taken for this visit.  Physical Exam Constitutional:      General: He is not in acute distress. Musculoskeletal:        General: No deformity.  Neurological:     Mental Status: He is alert and oriented to person, place, and time.     Coordination: Coordination normal.  Psychiatric:        Attention and  Perception: Attention and perception normal. He does not perceive auditory or visual hallucinations.        Mood and Affect: Mood normal. Mood is not anxious or depressed. Affect is not labile, blunt, angry or inappropriate.        Speech: Speech normal.        Behavior: Behavior normal.        Thought Content: Thought content normal. Thought content is not paranoid or delusional. Thought content does not include homicidal or suicidal ideation. Thought content does not include homicidal or suicidal plan.        Cognition and Memory: Cognition and memory normal.        Judgment: Judgment normal.     Comments: Insight intact    Lab Review:     Component Value Date/Time   NA 140 05/16/2020 0912   K 3.6 05/16/2020 0912   CL 105 05/16/2020 0912   CO2 24 05/16/2020 0912   GLUCOSE 202 (H) 05/16/2020 0912   BUN 11 05/16/2020 0912   CREATININE 1.05 05/16/2020 0912   CALCIUM 9.6 05/16/2020 0912   PROT 7.5 05/16/2020 0912   ALBUMIN 4.3 05/16/2020 0912   AST 20 05/16/2020 0912   ALT 27 05/16/2020 0912   ALKPHOS 73 05/16/2020 0912   BILITOT 0.7 05/16/2020 0912   GFRNONAA >60 05/16/2020 0912   GFRAA >60 05/16/2020 0912       Component Value Date/Time   WBC 12.5 (H) 05/16/2020 0912   RBC 5.12 05/16/2020 0912   HGB 15.3 05/16/2020 0912   HCT 45.6 05/16/2020 0912   PLT 392 05/16/2020 0912   MCV 89.1 05/16/2020 0912   MCH 29.9 05/16/2020 0912   MCHC 33.6 05/16/2020 0912   RDW 12.7 05/16/2020 0912   LYMPHSABS 2.4 04/19/2020 1252   MONOABS 0.8 04/19/2020 1252   EOSABS 0.0 04/19/2020 1252   BASOSABS 0.1 04/19/2020 1252    No results found for: POCLITH, LITHIUM   No results found for: PHENYTOIN, PHENOBARB, VALPROATE, CBMZ   .res Assessment: Plan:   Plan:  1. Perphenazine 6mg  in am and 6mg  at bedtime   2. Wellbutrin XL 300mg  daily 3. Amitriptyline 50mg  at hs - one tablet at bedtime. 4. Ritalin 20mg  every morning  Request records from [facility].  RTC 4  weeks  Discussed potential benefits, risks, and side effects of stimulants with patient to include increased heart rate, palpitations, insomnia, increased anxiety, increased irritability, or decreased appetite. Also discussed history of paranoia and to watch for any change in moods. Will also discuss changes with parents. Instructed patient to contact office if experiencing any significant tolerability issues.   Patient advised to contact office with any questions, adverse effects, or acute worsening in signs and symptoms.  Discussed potential metabolic  side effects associated with atypical antipsychotics, as well as potential risk for movement side effects. Advised pt to contact office if movement side effects occur.   There are no diagnoses linked to this encounter.   Please see After Visit Summary for patient specific instructions.  Future Appointments  Date Time Provider Department Center  12/12/2021  9:00 AM Yakub Lodes, Thereasa Solo, NP CP-CP None  12/19/2021  8:00 AM [name] Session, [facility] CP-CP None  01/04/2022  9:00 AM [name] Session, [facility] CP-CP None  01/16/2022  8:00 AM [name] Session, [facility] CP-CP None  01/30/2022  8:00 AM [name] Session, [facility] CP-CP None  02/14/2022  8:00 AM [name] Session, [facility] CP-CP None  02/28/2022  8:00 AM [name] Session, [facility] CP-CP None  03/14/2022  8:00 AM [name] Session, [facility] CP-CP None    No orders of the defined types were placed in this encounter.   -------------------------------

## 2021-12-19 ENCOUNTER — Ambulatory Visit: Payer: 59 | Admitting: Mental Health

## 2022-01-04 ENCOUNTER — Ambulatory Visit (INDEPENDENT_AMBULATORY_CARE_PROVIDER_SITE_OTHER): Payer: 59 | Admitting: Mental Health

## 2022-01-04 ENCOUNTER — Other Ambulatory Visit: Payer: Self-pay

## 2022-01-04 DIAGNOSIS — F411 Generalized anxiety disorder: Secondary | ICD-10-CM | POA: Diagnosis not present

## 2022-01-07 NOTE — Progress Notes (Signed)
[facility] Note ? ?Name: Adam Larsen. ?Date: 01/04/22 ?MRN: [id number]   ?DOB: 02/20/1986 ?PCP: Patient, No Pcp Per (Inactive) ? ?Time spent: 51 minutes ? ?Treatment:  Individual therapy ? ?Mental Status Exam: ?  ? ?Appearance:   Casual     ?Behavior:  Appropriate  ?Motor:  Normal  ?Speech/Language:   Clear and Coherent  ?Affect:  full range  ?Mood:  depressed and pleasant  ?Thought process:  normal  ?Thought content:    WNL  ?Sensory/Perceptual disturbances:    none  ?Orientation:  x4  ?Attention:  Good  ?Concentration:  Good  ?Memory:  WNL  ?Fund of knowledge:   Good  ?Insight:    Good  ?Judgment:   Good  ?Impulse Control:  Good  ? ?Reported Symptoms:  Sleep problems-wakes in the middle of the night, low motivation, isolative, intermittent depressed mood, anxiety, struggles with daily hygiene (not showering, brushing teeth etc), self depreciating thoughts ? ?Risk Assessment: ?Danger to Self:  No ?Self-injurious Behavior: No ?Danger to Others: No ?Duty to Warn:no ?Physical Aggression / Violence:No  ?Access to Firearms a concern: No  ?Gang Involvement:No  ?Patient / guardian was educated about steps to take if suicide or homicide risk level increases between visits: yes ?While future psychiatric events cannot be accurately predicted, the patient does not currently require acute inpatient psychiatric care and does not currently meet [facility] involuntary commitment criteria. ?  ? ?Medications: ?Current Outpatient Medications  ?Medication Sig Dispense Refill  ? amitriptyline (ELAVIL) 50 MG tablet Take 1 tablet (50 mg total) by mouth at bedtime. 30 tablet 2  ? Ascorbic Acid (VITAMIN C) 1000 MG tablet Take 2,000 mg by mouth daily.    ? buPROPion (WELLBUTRIN XL) 300 MG 24 hr tablet Take 1 tablet (300 mg total) by mouth daily. 30 tablet 2  ? cholecalciferol (VITAMIN D3) 25 MCG (1000 UNIT) tablet Take 2,000 Units by mouth daily.    ? loratadine (CLARITIN) 10 MG tablet Take 1 tablet (10 mg total) by  mouth daily. 30 tablet 0  ? methylphenidate (RITALIN) 20 MG tablet Take 1 tablet (20 mg total) by mouth daily. 30 tablet 0  ? Multiple Vitamin (MULTIVITAMIN) capsule Take 1 capsule by mouth daily.    ? omega-3 acid ethyl esters (LOVAZA) 1 g capsule Take 1 capsule (1 g total) by mouth 2 (two) times daily. 60 capsule 0  ? Omega-3 Fatty Acids (FISH OIL) 1000 MG CAPS Take 1,000 mg by mouth daily.    ? perphenazine (TRILAFON) 2 MG tablet Take 1 tablet (2 mg) by mouth with a 4 mg tablet twice a day (total of 6 mg bid) 60 tablet 2  ? perphenazine (TRILAFON) 4 MG tablet Take 1 tablet (4 mg) by mouth with a 2 mg tablet twice a day. (6 mg bid) 60 tablet 2  ? ?No current facility-administered medications for this visit.  ? ? ?Subjective: Patient presents for session on time.  Patient shared how he continues to live at home with his parents, continues to endorse a lack of motivation, as a primary reason for his struggling to find employment.  He continues to identify work in general as "being a slave to a boss, that is what jobs really are".  Facilitated patient identifying how, in order to achieve long-term goals such as increased independence, that finding a job may be necessary.  Patient agreed.  Continue to work with patient from a cognitive behavioral framework, challenged him to evaluate which cognitions he shared in  session was greatly affect his feeling emotionally distressed, elicit hopeless feelings.  Encouraged him to do so between sessions.  He stated that [facility] plans [facility] as his parents identified this as a potential resource for patient finding employment.  Patient plans to follow through. ? ? ? ?Interventions: CBT, supportive therapy, problem solving strategies ? ?Diagnoses:  ?  ICD-10-CM   ?1. GAD (generalized anxiety disorder)  F41.1   ?  ? ? ? ? ?  ?Plan: Patient is to use CBT, mindfulness and coping skills to help manage decrease symptoms associated with their diagnosis.   Patient to look for employment to obtain independence.   ? ?Long-term goal:   ?Reduce overall level, frequency, and intensity of the feelings of depression and anxiety 7-8/10 to a 0-2/10 in severity for at least 3 consecutive months. ?  ?Short-term goal:  ?Decrease anxiety producing self talk such as thinking of the worse possible life outcome ?Decrease ruminating, which can affect his sleep and increased emotional distress ?Continue to take medications as prescribed ?Increased self-confidence by engaging in an exercise regimen, working out ?Increase steps toward independence by finding employment and eventually moving out of this parent's home ? ?  ?Assessment of progress:  progressing ? ?[name] Session, [facility]  ?

## 2022-01-09 ENCOUNTER — Encounter: Payer: Self-pay | Admitting: Adult Health

## 2022-01-09 ENCOUNTER — Other Ambulatory Visit: Payer: Self-pay

## 2022-01-09 ENCOUNTER — Ambulatory Visit (INDEPENDENT_AMBULATORY_CARE_PROVIDER_SITE_OTHER): Payer: 59 | Admitting: Adult Health

## 2022-01-09 DIAGNOSIS — F900 Attention-deficit hyperactivity disorder, predominantly inattentive type: Secondary | ICD-10-CM

## 2022-01-09 DIAGNOSIS — F411 Generalized anxiety disorder: Secondary | ICD-10-CM

## 2022-01-09 DIAGNOSIS — F22 Delusional disorders: Secondary | ICD-10-CM

## 2022-01-09 DIAGNOSIS — F3181 Bipolar II disorder: Secondary | ICD-10-CM

## 2022-01-09 DIAGNOSIS — G4700 Insomnia, unspecified: Secondary | ICD-10-CM

## 2022-01-09 MED ORDER — METHYLPHENIDATE HCL 20 MG PO TABS: 20.0000 mg | ORAL_TABLET | Freq: Every day | ORAL | 0 refills | Status: DC

## 2022-01-09 NOTE — Progress Notes (Signed)
Adam Larsen. ?[id number] ?12-05-1985 ?36 y.o. ? ?Subjective:  ? ?Patient ID:  Adam Larsen. is a 36 y.o. (DOB 01-27-1986) male. ? ?Chief Complaint: No chief complaint on file. ? ? ?HPI ?Adam Larsen. presents to the office today for follow-up of MDD, GAD, insomnia, paranoia, and ADHD. ? ?Describes mood today as "ok". Pleasant. Denies tearfulness. Mood symptoms - denies depression, anxiety, and irritability. Mood is consistent. Stating "I feel like l'm doing ok". Feels like medications are helpful, but would like to have the Gene Sight testing done to ensure he is on the right medications. Taking current medications as prescribed. Seeing [name] for therapy. Improved interest and motivation. Taking medications as prescribed.  ?Energy levels improved. Active, does not have a regular exercise routine. ?Enjoys some usual interests and activities. Single. Lives with parents. Spending time with family.  ?Appetite adequate. Weight stable 215 pounds. ?Sleeps well most nights. Averages 8 hours a night. ?Focus and concentration improved with Ritalin - lasting 4 hours. Diagnosed with ADHD in childhood - history of taking Ritalin. Completing tasks around the house. Managing aspects of household.  ?Denies SI or HI.   ?Denies AH or VH.  ?Denies paranoia. ? ?Previous medication: Stratera, Lamictal ? ? ?PHQ2-9   ? ?Flowsheet Row ED from 05/16/2020 in [facility] EMERGENCY DEPARTMENT  ?PHQ-2 Total Score 6  ?PHQ-9 Total Score 17  ? ?  ? ?Flowsheet Row ED from 04/19/2020 in [facility] EMERGENCY DEPARTMENT  ?C-SSRS RISK CATEGORY Error: Q2 is Yes, you must answer 3, 4, and 5  ? ?  ?  ? ?Review of Systems:  ?Review of Systems  ?Musculoskeletal:  Negative for gait problem.  ?Neurological:  Negative for tremors.  ?Psychiatric/Behavioral:    ?     Please refer to HPI  ? ?Medications: I have reviewed the patient's current medications. ? ?Current Outpatient Medications  ?Medication Sig  Dispense Refill  ? amitriptyline (ELAVIL) 50 MG tablet Take 1 tablet (50 mg total) by mouth at bedtime. 30 tablet 2  ? Ascorbic Acid (VITAMIN C) 1000 MG tablet Take 2,000 mg by mouth daily.    ? buPROPion (WELLBUTRIN XL) 300 MG 24 hr tablet Take 1 tablet (300 mg total) by mouth daily. 30 tablet 2  ? cholecalciferol (VITAMIN D3) 25 MCG (1000 UNIT) tablet Take 2,000 Units by mouth daily.    ? loratadine (CLARITIN) 10 MG tablet Take 1 tablet (10 mg total) by mouth daily. 30 tablet 0  ? methylphenidate (RITALIN) 20 MG tablet Take 1 tablet (20 mg total) by mouth daily. 30 tablet 0  ? Multiple Vitamin (MULTIVITAMIN) capsule Take 1 capsule by mouth daily.    ? omega-3 acid ethyl esters (LOVAZA) 1 g capsule Take 1 capsule (1 g total) by mouth 2 (two) times daily. 60 capsule 0  ? Omega-3 Fatty Acids (FISH OIL) 1000 MG CAPS Take 1,000 mg by mouth daily.    ? perphenazine (TRILAFON) 2 MG tablet Take 1 tablet (2 mg) by mouth with a 4 mg tablet twice a day (total of 6 mg bid) 60 tablet 2  ? perphenazine (TRILAFON) 4 MG tablet Take 1 tablet (4 mg) by mouth with a 2 mg tablet twice a day. (6 mg bid) 60 tablet 2  ? ?No current facility-administered medications for this visit.  ? ? ?Medication Side Effects: None ? ?Allergies: No Known Allergies ? ?Past Medical History:  ?Diagnosis Date  ? Anxiety   ? Bipolar 1 disorder (HCC)   ?  Depression   ? Mania (HCC)   ? Suicidal ideations   ? ? ?Past Medical History, Surgical history, Social history, and Family history were reviewed and updated as appropriate.  ? ?Please see review of systems for further details on the patient's review from today.  ? ?Objective:  ? ?Physical Exam:  ?There were no vitals taken for this visit. ? ?Physical Exam ?Constitutional:   ?   General: He is not in acute distress. ?Musculoskeletal:     ?   General: No deformity.  ?Neurological:  ?   Mental Status: He is alert and oriented to person, place, and time.  ?   Coordination: Coordination normal.  ?Psychiatric:      ?   Attention and Perception: Attention and perception normal. He does not perceive auditory or visual hallucinations.     ?   Mood and Affect: Mood normal. Mood is not anxious or depressed. Affect is not labile, blunt, angry or inappropriate.     ?   Speech: Speech normal.     ?   Behavior: Behavior normal.     ?   Thought Content: Thought content normal. Thought content is not paranoid or delusional. Thought content does not include homicidal or suicidal ideation. Thought content does not include homicidal or suicidal plan.     ?   Cognition and Memory: Cognition and memory normal.     ?   Judgment: Judgment normal.  ?   Comments: Insight intact  ? ? ?Lab Review:  ?   ?Component Value Date/Time  ? NA 140 05/16/2020 0912  ? K 3.6 05/16/2020 0912  ? CL 105 05/16/2020 0912  ? CO2 24 05/16/2020 0912  ? GLUCOSE 202 (H) 05/16/2020 0912  ? BUN 11 05/16/2020 0912  ? CREATININE 1.05 05/16/2020 0912  ? CALCIUM 9.6 05/16/2020 0912  ? PROT 7.5 05/16/2020 0912  ? ALBUMIN 4.3 05/16/2020 0912  ? AST 20 05/16/2020 0912  ? ALT 27 05/16/2020 0912  ? ALKPHOS 73 05/16/2020 0912  ? BILITOT 0.7 05/16/2020 0912  ? GFRNONAA >60 05/16/2020 0912  ? GFRAA >60 05/16/2020 0912  ? ? ?   ?Component Value Date/Time  ? WBC 12.5 (H) 05/16/2020 0912  ? RBC 5.12 05/16/2020 0912  ? HGB 15.3 05/16/2020 0912  ? HCT 45.6 05/16/2020 0912  ? PLT 392 05/16/2020 0912  ? MCV 89.1 05/16/2020 0912  ? MCH 29.9 05/16/2020 0912  ? MCHC 33.6 05/16/2020 0912  ? RDW 12.7 05/16/2020 0912  ? LYMPHSABS 2.4 04/19/2020 1252  ? MONOABS 0.8 04/19/2020 1252  ? EOSABS 0.0 04/19/2020 1252  ? BASOSABS 0.1 04/19/2020 1252  ? ? ?No results found for: POCLITH, LITHIUM  ? ?No results found for: PHENYTOIN, PHENOBARB, VALPROATE, CBMZ  ? ?.res ?Assessment: Plan:   ? ?Plan: ? ?1. Perphenazine 6mg  in am and 6mg  at bedtime   ?2. Wellbutrin XL 300mg  daily ?3. Amitriptyline 50mg  at hs - one tablet at bedtime. ?4. Ritalin 20mg  every morning ? ?Discussed Genesight testing - information  given to review with parents - will call to make an appt for testing if he wants to move forward with the testing. ? ?RTC 4 weeks ? ?Discussed potential benefits, risks, and side effects of stimulants with patient to include increased heart rate, palpitations, insomnia, increased anxiety, increased irritability, or decreased appetite. Also discussed history of paranoia and to watch for any change in moods. Will also discuss changes with parents. Instructed patient to contact office if experiencing any significant tolerability  issues.  ? ?Patient advised to contact office with any questions, adverse effects, or acute worsening in signs and symptoms. ? ?Discussed potential metabolic side effects associated with atypical antipsychotics, as well as potential risk for movement side effects. Advised pt to contact office if movement side effects occur.  ? ?Diagnoses and all orders for this visit: ? ?GAD (generalized anxiety disorder) ? ?ADHD, predominantly inattentive type ?-     methylphenidate (RITALIN) 20 MG tablet; Take 1 tablet (20 mg total) by mouth daily. ? ?Bipolar 2 disorder (HCC) ? ?Paranoia (HCC) ? ?Insomnia, unspecified type ? ?  ? ?Please see After Visit Summary for patient specific instructions. ? ?Future Appointments  ?Date Time Provider Department Center  ?01/16/2022  8:00 AM [name] SessionAndrews, [facility] None  ?01/30/2022  8:00 AM [name] SessionAndrews, Christopher, [facility] None  ?02/06/2022  8:00 AM Amarissa Koerner, Thereasa Soloegina Nattalie, NP CP-CP None  ?02/14/2022  8:00 AM [name] SessionAndrews, [facility], [facility] None  ?02/28/2022  8:00 AM [name] SessionAndrews, [facility] None  ?03/14/2022  8:00 AM [name] SessionAndrews, Christopher, [facility] None  ? ? ?No orders of the defined types were placed in this encounter. ? ? ?------------------------------- ?

## 2022-01-16 ENCOUNTER — Other Ambulatory Visit: Payer: Self-pay

## 2022-01-16 ENCOUNTER — Ambulatory Visit (INDEPENDENT_AMBULATORY_CARE_PROVIDER_SITE_OTHER): Payer: 59 | Admitting: Mental Health

## 2022-01-16 DIAGNOSIS — F411 Generalized anxiety disorder: Secondary | ICD-10-CM

## 2022-01-16 NOTE — Progress Notes (Signed)
[facility] ? ?Name: Adam Larsen. ?Date: 01/16/22 ?MRN: [id number]   ?DOB: [birth year] ?PCP: Patient, No Pcp Per (Inactive) ? ?Time spent: 52 minutes ? ?Treatment:  Individual therapy ? ?Mental Status Exam: ?  ? ?Appearance:   Casual     ?Behavior:  Appropriate  ?Motor:  Normal  ?Speech/Language:   Clear and Coherent  ?Affect:  full range  ?Mood:  depressed and pleasant  ?Thought process:  normal  ?Thought content:    WNL  ?Sensory/Perceptual disturbances:    none  ?Orientation:  x4  ?Attention:  Good  ?Concentration:  Good  ?Memory:  WNL  ?Fund of knowledge:   Good  ?Insight:    Good  ?Judgment:   Good  ?Impulse Control:  Good  ? ?Reported Symptoms:  Sleep problems-wakes in the middle of the night, low motivation, isolative, intermittent depressed mood, anxiety, struggles with daily hygiene (not showering, brushing teeth etc), self depreciating thoughts ? ?Risk Assessment: ?Danger to Self:  No ?Self-injurious Behavior: No ?Danger to Others: No ?Duty to Warn:no ?Physical Aggression / Violence:No  ?Access to Firearms a concern: No  ?Gang Involvement:No  ?Patient / guardian was educated about steps to take if suicide or homicide risk level increases between visits: yes ?While future psychiatric events cannot be accurately predicted, the patient does not currently require acute inpatient psychiatric care and does not currently meet [facility] involuntary commitment criteria. ?  ? ?Medications: ?Current Outpatient Medications  ?Medication Sig Dispense Refill  ? amitriptyline (ELAVIL) 50 MG tablet Take 1 tablet (50 mg total) by mouth at bedtime. 30 tablet 2  ? Ascorbic Acid (VITAMIN C) 1000 MG tablet Take 2,000 mg by mouth daily.    ? buPROPion (WELLBUTRIN XL) 300 MG 24 hr tablet Take 1 tablet (300 mg total) by mouth daily. 30 tablet 2  ? cholecalciferol (VITAMIN D3) 25 MCG (1000 UNIT) tablet Take 2,000 Units by mouth daily.    ? loratadine (CLARITIN) 10 MG tablet Take 1 tablet (10 mg total) by  mouth daily. 30 tablet 0  ? methylphenidate (RITALIN) 20 MG tablet Take 1 tablet (20 mg total) by mouth daily. 30 tablet 0  ? Multiple Vitamin (MULTIVITAMIN) capsule Take 1 capsule by mouth daily.    ? omega-3 acid ethyl esters (LOVAZA) 1 g capsule Take 1 capsule (1 g total) by mouth 2 (two) times daily. 60 capsule 0  ? Omega-3 Fatty Acids (FISH OIL) 1000 MG CAPS Take 1,000 mg by mouth daily.    ? perphenazine (TRILAFON) 2 MG tablet Take 1 tablet (2 mg) by mouth with a 4 mg tablet twice a day (total of 6 mg bid) 60 tablet 2  ? perphenazine (TRILAFON) 4 MG tablet Take 1 tablet (4 mg) by mouth with a 2 mg tablet twice a day. (6 mg bid) 60 tablet 2  ? ?No current facility-administered medications for this visit.  ? ? ?Subjective: Patient presents for session on time.  Patient shared how he has struggled to receive a call back from [facility], which is a local entity that helps individuals with mental health issues find employment.  He stated that he is called several times left a few messages.  He stated his parents have been asking him about finding a job more frequently lately, he states he understands their frustration and how he is made attempts.  At this point he is unsure what steps to take.  We discussed in session, to call again and as opposed to being put  through to voicemail, communicate with the operator about the challenges of having someone reach back out to him.  He plans to follow through today. ?He shared more family history related to their recent dynamics.  He stated he has an older brother in [place] who is married and has a nephew.  He stated that most of the family has had difficulty with his wife due to her critical nature.  Patient stated he tries to avoid having any contact with her due to a falling out they had a few years ago.  He stated his brother is possibly going to return back home after his parents have paid for him to have an apartment over the last year but unfortunately his  also not found employment.  He stated he is taking his medication as prescribed and plans to engage in testing at [facility] as his parents are curious that his medications are as helpful as they could be.  Assisted him in framing some cognitions related to trying to galvanize some motivation daily to find employment.  ? ?Interventions: CBT, supportive therapy, problem solving strategies ? ?Diagnoses:  ?  ICD-10-CM   ?1. GAD (generalized anxiety disorder)  F41.1   ?  ? ? ? ?  ?Plan: Patient is to use CBT, mindfulness and coping skills to help manage decrease symptoms associated with their diagnosis.  Patient to look for employment to obtain independence.   ? ?Long-term goal:   ?Reduce overall level, frequency, and intensity of the feelings of depression and anxiety 7-8/10 to a 0-2/10 in severity for at least 3 consecutive months. ?  ?Short-term goal:  ?Decrease anxiety producing self talk such as thinking of the worse possible life outcome ?Decrease ruminating, which can affect his sleep and increased emotional distress ?Continue to take medications as prescribed ?Increased self-confidence by engaging in an exercise regimen, working out ?Increase steps toward independence by finding employment and eventually moving out of this parent's home ? ?  ?Assessment of progress:  progressing ? ?[name] Session, [facility]  ?

## 2022-01-22 ENCOUNTER — Telehealth: Payer: Self-pay | Admitting: Adult Health

## 2022-01-22 DIAGNOSIS — F331 Major depressive disorder, recurrent, moderate: Secondary | ICD-10-CM

## 2022-01-22 MED ORDER — BUPROPION HCL ER (XL) 300 MG PO TB24: 300.0000 mg | ORAL_TABLET | Freq: Every day | ORAL | 2 refills | Status: DC

## 2022-01-22 NOTE — Telephone Encounter (Signed)
Mom called this morning at 10:30am to check on the status of the refill of Adam Larsen's Bupropion.  She said the pharmacy was waiting on [place] to respond to their request.  I don't see one and I told her that.  He has an appt 02/06/22. Please send in a refill to [facility] on [address] ?

## 2022-01-22 NOTE — Telephone Encounter (Signed)
Rx sent 

## 2022-01-29 ENCOUNTER — Encounter: Payer: Self-pay | Admitting: Adult Health

## 2022-01-30 ENCOUNTER — Ambulatory Visit: Payer: 59 | Admitting: Mental Health

## 2022-02-04 ENCOUNTER — Other Ambulatory Visit: Payer: Self-pay | Admitting: Adult Health

## 2022-02-04 DIAGNOSIS — G4700 Insomnia, unspecified: Secondary | ICD-10-CM

## 2022-02-05 NOTE — Telephone Encounter (Signed)
Appt with Almira Coaster tomorrow.  ?

## 2022-02-06 ENCOUNTER — Encounter: Payer: Self-pay | Admitting: Adult Health

## 2022-02-06 ENCOUNTER — Ambulatory Visit (INDEPENDENT_AMBULATORY_CARE_PROVIDER_SITE_OTHER): Payer: 59 | Admitting: Adult Health

## 2022-02-06 DIAGNOSIS — G4700 Insomnia, unspecified: Secondary | ICD-10-CM | POA: Diagnosis not present

## 2022-02-06 DIAGNOSIS — F22 Delusional disorders: Secondary | ICD-10-CM

## 2022-02-06 DIAGNOSIS — F331 Major depressive disorder, recurrent, moderate: Secondary | ICD-10-CM | POA: Diagnosis not present

## 2022-02-06 DIAGNOSIS — F319 Bipolar disorder, unspecified: Secondary | ICD-10-CM

## 2022-02-06 DIAGNOSIS — F900 Attention-deficit hyperactivity disorder, predominantly inattentive type: Secondary | ICD-10-CM | POA: Diagnosis not present

## 2022-02-06 DIAGNOSIS — F411 Generalized anxiety disorder: Secondary | ICD-10-CM

## 2022-02-06 MED ORDER — PERPHENAZINE 2 MG PO TABS: ORAL_TABLET | ORAL | 2 refills | Status: DC

## 2022-02-06 MED ORDER — AMITRIPTYLINE HCL 50 MG PO TABS: 50.0000 mg | ORAL_TABLET | Freq: Every day | ORAL | 2 refills | Status: DC

## 2022-02-06 MED ORDER — PERPHENAZINE 4 MG PO TABS: ORAL_TABLET | ORAL | 2 refills | Status: DC

## 2022-02-06 MED ORDER — METHYLPHENIDATE HCL 20 MG PO TABS: 20.0000 mg | ORAL_TABLET | Freq: Two times a day (BID) | ORAL | 0 refills | Status: DC

## 2022-02-06 NOTE — Progress Notes (Signed)
Adam Larsen. ?[id number] ?Nov 16, 1985 ?36 y.o. ? ?Subjective:  ? ?Patient ID:  Adam Calix. is a 36 y.o. (DOB [birth year]) male. ? ?Chief Complaint: No chief complaint on file. ? ? ?HPI ?Adam Larsen. presents to the office today for follow-up of MDD, GAD, insomnia, paranoia, and ADHD. ? ?Describes mood today as "ok". Pleasant. Denies tearfulness. Mood symptoms - denies depression, anxiety, and irritability. Mood is consistent. Stating "I'm doing pretty good". 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 Feels like medications are working well - would like to increase dose of Ritalin from 20mg  to 40mg  daily with returning to a work setting. Taking current medications as prescribed. Seeing for therapy. Improved interest and motivation. Taking medications as prescribed.  ?Energy levels improved - trying to get out more. Active, does not have a regular exercise routine, but has been walking more. ?Enjoys some usual interests and activities. Single. Lives with parents. Spending time with family.  ?Appetite adequate. Weight stable 215 pounds. ?Sleeps well most nights. Averages 7 to 8 hours a night. ?Focus and concentration improved. Diagnosed with ADHD in childhood - history of taking Ritalin. Completing tasks around the house. Managing aspects of household.  ?Denies SI or HI.   ?Denies AH or VH.  ?Denies paranoia. ? ?Previous medication: Stratera, Lamictal ? ? ?PHQ2-9   ? ?Flowsheet Row ED from 05/16/2020 in [facility] EMERGENCY DEPARTMENT  ?PHQ-2 Total Score 6  ?PHQ-9 Total Score 17  ? ?  ? ?Flowsheet Row ED from 04/19/2020 in [facility] EMERGENCY DEPARTMENT  ?C-SSRS RISK CATEGORY Error: Q2 is Yes, you must answer 3, 4, and 5  ? ?  ?  ? ?Review of Systems:  ?Review of Systems  ?Musculoskeletal:  Negative for gait problem.  ?Neurological:  Negative for tremors.  ?Psychiatric/Behavioral:    ?     Please refer to HPI   ? ?Medications: I have reviewed the patient's current medications. ? ?Current Outpatient Medications  ?Medication Sig Dispense Refill  ? amitriptyline (ELAVIL) 50 MG tablet Take 1 tablet (50 mg total) by mouth at bedtime. 30 tablet 2  ? Ascorbic Acid (VITAMIN C) 1000 MG tablet Take 2,000 mg by mouth daily.    ? buPROPion (WELLBUTRIN XL) 300 MG 24 hr tablet Take 1 tablet (300 mg total) by mouth daily. 30 tablet 2  ? cholecalciferol (VITAMIN D3) 25 MCG (1000 UNIT) tablet Take 2,000 Units by mouth daily.    ? loratadine (CLARITIN) 10 MG tablet Take 1 tablet (10 mg total) by mouth daily. 30 tablet 0  ? methylphenidate (RITALIN) 20 MG tablet Take 1 tablet (20 mg total) by mouth daily. 30 tablet 0  ? Multiple Vitamin (MULTIVITAMIN) capsule Take 1 capsule by mouth daily.    ? omega-3 acid ethyl esters (LOVAZA) 1 g capsule Take 1 capsule (1 g total) by mouth 2 (two) times daily. 60 capsule 0  ? Omega-3 Fatty Acids (FISH OIL) 1000 MG CAPS Take 1,000 mg by mouth daily.    ? perphenazine (TRILAFON) 2 MG tablet Take 1 tablet (2 mg) by mouth with a 4 mg tablet twice a day (total of 6 mg bid) 60 tablet 2  ? perphenazine (TRILAFON) 4 MG tablet Take 1 tablet (4 mg) by mouth with a 2 mg tablet twice a day. (6 mg bid) 60 tablet 2  ? ?No current facility-administered medications for this visit.  ? ? ?Medication Side Effects:  None ? ?Allergies: No Known Allergies ? ?Past Medical History:  ?Diagnosis Date  ? Anxiety   ? Bipolar 1 disorder (HCC)   ? Depression   ? Mania (HCC)   ? Suicidal ideations   ? ? ?Past Medical History, Surgical history, Social history, and Family history were reviewed and updated as appropriate.  ? ?Please see review of systems for further details on the patient's review from today.  ? ?Objective:  ? ?Physical Exam:  ?There were no vitals taken for this visit. ? ?Physical Exam ?Constitutional:   ?   General: He is not in acute distress. ?Musculoskeletal:     ?   General: No deformity.  ?Neurological:  ?   Mental  Status: He is alert and oriented to person, place, and time.  ?   Coordination: Coordination normal.  ?Psychiatric:     ?   Attention and Perception: Attention and perception normal. He does not perceive auditory or visual hallucinations.     ?   Mood and Affect: Mood normal. Mood is not anxious or depressed. Affect is not labile, blunt, angry or inappropriate.     ?   Speech: Speech normal.     ?   Behavior: Behavior normal.     ?   Thought Content: Thought content normal. Thought content is not paranoid or delusional. Thought content does not include homicidal or suicidal ideation. Thought content does not include homicidal or suicidal plan.     ?   Cognition and Memory: Cognition and memory normal.     ?   Judgment: Judgment normal.  ?   Comments: Insight intact  ? ? ?Lab Review:  ?   ?Component Value Date/Time  ? NA 140 05/16/2020 0912  ? K 3.6 05/16/2020 0912  ? CL 105 05/16/2020 0912  ? CO2 24 05/16/2020 0912  ? GLUCOSE 202 (H) 05/16/2020 0912  ? BUN 11 05/16/2020 0912  ? CREATININE 1.05 05/16/2020 0912  ? CALCIUM 9.6 05/16/2020 0912  ? PROT 7.5 05/16/2020 0912  ? ALBUMIN 4.3 05/16/2020 0912  ? AST 20 05/16/2020 0912  ? ALT 27 05/16/2020 0912  ? ALKPHOS 73 05/16/2020 0912  ? BILITOT 0.7 05/16/2020 0912  ? GFRNONAA >60 05/16/2020 0912  ? GFRAA >60 05/16/2020 0912  ? ? ?   ?Component Value Date/Time  ? WBC 12.5 (H) 05/16/2020 0912  ? RBC 5.12 05/16/2020 0912  ? HGB 15.3 05/16/2020 0912  ? HCT 45.6 05/16/2020 0912  ? PLT 392 05/16/2020 0912  ? MCV 89.1 05/16/2020 0912  ? MCH 29.9 05/16/2020 0912  ? MCHC 33.6 05/16/2020 0912  ? RDW 12.7 05/16/2020 0912  ? LYMPHSABS 2.4 04/19/2020 1252  ? MONOABS 0.8 04/19/2020 1252  ? EOSABS 0.0 04/19/2020 1252  ? BASOSABS 0.1 04/19/2020 1252  ? ? ?No results found for: POCLITH, LITHIUM  ? ?No results found for: PHENYTOIN, PHENOBARB, VALPROATE, CBMZ  ? ?.res ?Assessment: Plan:   ? ?Plan: ? ?1. Perphenazine 6mg  in am and 6mg  at bedtime   ?2. Wellbutrin XL 300mg  daily ?3.  Amitriptyline 50mg  at hs - one tablet at bedtime. ?4. Increase Ritalin 20mg  every morning to BID ? ? results for patient. ? ?RTC 4 weeks ? ?Discussed potential benefits, risks, and side effects of stimulants with patient to include increased heart rate, palpitations, insomnia, increased anxiety, increased irritability, or decreased appetite. Also discussed history of paranoia and to watch for any change in moods. Will also discuss changes with parents. Instructed patient to contact  office if experiencing any significant tolerability issues.  ? ?Patient advised to contact office with any questions, adverse effects, or acute worsening in signs and symptoms. ? ?Discussed potential metabolic side effects associated with atypical antipsychotics, as well as potential risk for movement side effects. Advised pt to contact office if movement side effects occur.  ?There are no diagnoses linked to this encounter.  ? ?Please see After Visit Summary for patient specific instructions. ? ?Future Appointments  ?Date Time Provider Department Center  ?02/06/2022  8:00 AM Heraclio Seidman, Thereasa Soloegina Nattalie, NP CP-CP None  ?02/14/2022  8:00 AM [name] SessionAndrews, [facility] None  ?02/28/2022  8:00 AM [name] SessionAndrews, Christopher, [facility] None  ?03/14/2022  8:00 AM [name] SessionAndrews, Christopher, [facility] None  ? ? ?No orders of the defined types were placed in this encounter. ? ? ?------------------------------- ?

## 2022-02-14 ENCOUNTER — Ambulatory Visit (INDEPENDENT_AMBULATORY_CARE_PROVIDER_SITE_OTHER): Payer: 59 | Admitting: Mental Health

## 2022-02-14 DIAGNOSIS — F319 Bipolar disorder, unspecified: Secondary | ICD-10-CM

## 2022-02-14 NOTE — Progress Notes (Addendum)
[facility] ? ?Name: Adam Larsen. ?Date: 02/14/22 ?MRN: [id number]   ?DOB: 1985/11/18 ?PCP: Patient, No Pcp Per (Inactive) ? ?Time spent: 55 minutes ? ?Treatment:  Individual therapy ? ?Mental Status Exam: ?  ? ?Appearance:   Casual     ?Behavior:  Appropriate  ?Motor:  Normal  ?Speech/Language:   Clear and Coherent  ?Affect:  full range  ?Mood:  Euthymic, pleasant  ?Thought process:  normal  ?Thought content:    WNL  ?Sensory/Perceptual disturbances:    none  ?Orientation:  x4  ?Attention:  Good  ?Concentration:  Good  ?Memory:  WNL  ?Fund of knowledge:   Good  ?Insight:    Good  ?Judgment:   Good  ?Impulse Control:  Good  ? ?Reported Symptoms:  Sleep problems-wakes in the middle of the night, low motivation, isolative, intermittent depressed mood, anxiety, struggles with daily hygiene (not showering, brushing teeth etc), self depreciating thoughts ? ?Risk Assessment: ?Danger to Self:  No ?Self-injurious Behavior: No ?Danger to Others: No ?Duty to Warn:no ?Physical Aggression / Violence:No  ?Access to Firearms a concern: No  ?Gang Involvement:No  ?Patient / guardian was educated about steps to take if suicide or homicide risk level increases between visits: yes ?While future psychiatric events cannot be accurately predicted, the patient does not currently require acute inpatient psychiatric care and does not currently meet [facility] involuntary commitment criteria. ?  ? ?Medications: ?Current Outpatient Medications  ?Medication Sig Dispense Refill  ? amitriptyline (ELAVIL) 50 MG tablet Take 1 tablet (50 mg total) by mouth at bedtime. 30 tablet 2  ? Ascorbic Acid (VITAMIN C) 1000 MG tablet Take 2,000 mg by mouth daily.    ? buPROPion (WELLBUTRIN XL) 300 MG 24 hr tablet Take 1 tablet (300 mg total) by mouth daily. 30 tablet 2  ? cholecalciferol (VITAMIN D3) 25 MCG (1000 UNIT) tablet Take 2,000 Units by mouth daily.    ? loratadine (CLARITIN) 10 MG tablet Take 1 tablet (10 mg total) by mouth  daily. 30 tablet 0  ? methylphenidate (RITALIN) 20 MG tablet Take 1 tablet (20 mg total) by mouth 2 (two) times daily. 60 tablet 0  ? Multiple Vitamin (MULTIVITAMIN) capsule Take 1 capsule by mouth daily.    ? omega-3 acid ethyl esters (LOVAZA) 1 g capsule Take 1 capsule (1 g total) by mouth 2 (two) times daily. 60 capsule 0  ? Omega-3 Fatty Acids (FISH OIL) 1000 MG CAPS Take 1,000 mg by mouth daily.    ? perphenazine (TRILAFON) 2 MG tablet Take 1 tablet (2 mg) by mouth with a 4 mg tablet twice a day (total of 6 mg bid) 60 tablet 2  ? perphenazine (TRILAFON) 4 MG tablet Take 1 tablet (4 mg) by mouth with a 2 mg tablet twice a day. (6 mg bid) 60 tablet 2  ? ?No current facility-administered medications for this visit.  ? ? ?Subjective: Patient presents for session on time.  He shared recent events in progress, stating that he is waiting to hear back from an agency that is to assist him getting employment.  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  Family relationships were assessed.  Patient stated his brother continues to struggle himself with finding employment but this is due to his lack of looking for employment.  He stated that he used to be very close to his brother, he considers them "best  friends" until just a few years ago.  He states that he thinks his brother may be depressed, is often stoic and when patient asked him to engage in some activities, spend time together his brother always declines.  Patient expresses optimism about the potential job he may receive through the agency.  Assisted him in framing thoughts about his current situation and seeking employment.  Patient more hopeful and optimistic, no negative comments about the idea of reengaging in employment.  Explored outlets for stress management and enjoyment.  Patient stated that he has been making attempts [place] and plans to  continue. ? ? ? ? ?Interventions: CBT, supportive therapy, problem solving strategies ? ?Diagnoses:  ?  ICD-10-CM   ?1. Bipolar I disorder ([place])  F31.9   ?  ? ? ? ? ?  ?Plan: Patient is to use CBT, mindfulness and coping skills to help manage decrease symptoms associated with their diagnosis.  Patient to look for employment to obtain independence.   ? ?Long-term goal:   ?Reduce overall level, frequency, and intensity of the feelings of depression and anxiety 7-8/10 to a 0-2/10 in severity for at least 3 consecutive months. ?  ?Short-term goal:  ?Decrease anxiety producing self talk such as thinking of the worse possible life outcome ?Decrease ruminating, which can affect his sleep and increased emotional distress ?Continue to take medications as prescribed ?Increased self-confidence by engaging in an exercise regimen, working out ?Increase steps toward independence by finding employment and eventually moving out of this parent's home ? ?  ?Assessment of progress:  progressing ? ?Anson [place], [facility]  ?

## 2022-02-28 ENCOUNTER — Ambulatory Visit: Payer: 59 | Admitting: Mental Health

## 2022-03-08 ENCOUNTER — Ambulatory Visit: Payer: 59 | Admitting: Adult Health

## 2022-03-08 ENCOUNTER — Telehealth: Payer: Self-pay | Admitting: Adult Health

## 2022-03-08 ENCOUNTER — Other Ambulatory Visit: Payer: Self-pay

## 2022-03-08 DIAGNOSIS — F900 Attention-deficit hyperactivity disorder, predominantly inattentive type: Secondary | ICD-10-CM

## 2022-03-08 MED ORDER — METHYLPHENIDATE HCL 20 MG PO TABS: 20.0000 mg | ORAL_TABLET | Freq: Two times a day (BID) | ORAL | 0 refills | Status: DC

## 2022-03-08 NOTE — Telephone Encounter (Signed)
Patient already had refills available at [facility] for all except Ritalin. Will pend RF for that to provider.

## 2022-03-08 NOTE — Telephone Encounter (Signed)
Next visit is 6/14. Adam Larsen is requesting refills on Wellbutrin 300 mg, Ritalin 20 mg, Amitriptyline 50 mg and Perphenazine 4 mg. Pharmacy is:  [facility] [address], [place] - [address]  Phone:  [phone]  Fax:  [phone]

## 2022-03-14 ENCOUNTER — Ambulatory Visit (INDEPENDENT_AMBULATORY_CARE_PROVIDER_SITE_OTHER): Payer: 59 | Admitting: Mental Health

## 2022-03-14 DIAGNOSIS — F411 Generalized anxiety disorder: Secondary | ICD-10-CM

## 2022-03-14 NOTE — Progress Notes (Signed)
[facility]  Name: Adam Larsen. Date: 03/14/22 MRN: [id number]   DOB: 1985/11/24 PCP: Patient, No Pcp Per (Inactive)  Time spent: 54 minutes  Treatment:  Individual therapy  Mental Status Exam:    Appearance:   Casual     Behavior:  Appropriate  Motor:  Normal  Speech/Language:   Clear and Coherent  Affect:  full range  Mood:  Euthymic, pleasant  Thought process:  normal  Thought content:    WNL  Sensory/Perceptual disturbances:    none  Orientation:  x4  Attention:  Good  Concentration:  Good  Memory:  WNL  Fund of knowledge:   Good  Insight:    Good  Judgment:   Good  Impulse Control:  Good   Reported Symptoms:  Sleep problems-wakes in the middle of the night, low motivation, isolative, intermittent depressed mood, anxiety, struggles with daily hygiene (not showering, brushing teeth etc), self depreciating thoughts  Risk Assessment: Danger to Self:  No Self-injurious Behavior: No Danger to Others: No Duty to Warn:no Physical Aggression / Violence:No  Access to Firearms a concern: No  Gang Involvement:No  Patient / guardian was educated about steps to take if suicide or homicide risk level increases between visits: yes While future psychiatric events cannot be accurately predicted, the patient does not currently require acute inpatient psychiatric care and does not currently meet [facility] involuntary commitment criteria.    Medications: Current Outpatient Medications  Medication Sig Dispense Refill   amitriptyline (ELAVIL) 50 MG tablet Take 1 tablet (50 mg total) by mouth at bedtime. 30 tablet 2   Ascorbic Acid (VITAMIN C) 1000 MG tablet Take 2,000 mg by mouth daily.     buPROPion (WELLBUTRIN XL) 300 MG 24 hr tablet Take 1 tablet (300 mg total) by mouth daily. 30 tablet 2   cholecalciferol (VITAMIN D3) 25 MCG (1000 UNIT) tablet Take 2,000 Units by mouth daily.     loratadine (CLARITIN) 10 MG tablet Take 1 tablet (10 mg total) by mouth  daily. 30 tablet 0   methylphenidate (RITALIN) 20 MG tablet Take 1 tablet (20 mg total) by mouth 2 (two) times daily. 60 tablet 0   Multiple Vitamin (MULTIVITAMIN) capsule Take 1 capsule by mouth daily.     omega-3 acid ethyl esters (LOVAZA) 1 g capsule Take 1 capsule (1 g total) by mouth 2 (two) times daily. 60 capsule 0   Omega-3 Fatty Acids (FISH OIL) 1000 MG CAPS Take 1,000 mg by mouth daily.     perphenazine (TRILAFON) 2 MG tablet Take 1 tablet (2 mg) by mouth with a 4 mg tablet twice a day (total of 6 mg bid) 60 tablet 2   perphenazine (TRILAFON) 4 MG tablet Take 1 tablet (4 mg) by mouth with a 2 mg tablet twice a day. (6 mg bid) 60 tablet 2   No current facility-administered medications for this visit.    Subjective: Patient presents for session on time.  Patient shared recent events in progress toward goals.  He stated that he has been meeting with the [facility] house staff.  He stated that they plan to meet next week and discussed vocational rehab opportunities.  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  He stated he is also considering taking some certification course work related.  He expresses more motivation, the benefits of having the [facility] house staff person with him as a guide as he navigates options.  He recognizes this being a process, to give himself time to explore options but also to take steps incrementally to make progress.  Assisted him in framing thoughts related to his making progress, where he stated he feels a sense of accomplishment recently; assisted patient in further processing how this has affected his mood and sense of wellbeing. Other family related issues were discussed related to concerns about his brother who also copes with some mental health issues.     Interventions: CBT, supportive therapy, problem solving strategies  Diagnoses:    ICD-10-CM   1. GAD (generalized  anxiety disorder)  F41.1            Plan: Patient is to use CBT, mindfulness and coping skills to help decrease symptoms associated with their diagnosis.  Patient to look for employment to obtain independence.    Long-term goal:   Reduce overall level, frequency, and intensity of the feelings of depression and anxiety 7-8/10 to a 0-2/10 in severity for at least 3 consecutive months.   Short-term goal:  Decrease anxiety producing self talk such as thinking of the worse possible life outcome Decrease ruminating, which can affect his sleep and increased emotional distress Continue to take medications as prescribed Increased self-confidence by engaging in an exercise regimen, working out Increase steps toward independence by finding employment and eventually moving out of this parent's home    Assessment of progress:  progressing  [name] Session, [facility]

## 2022-04-03 ENCOUNTER — Encounter: Payer: Self-pay | Admitting: Adult Health

## 2022-04-03 ENCOUNTER — Ambulatory Visit (INDEPENDENT_AMBULATORY_CARE_PROVIDER_SITE_OTHER): Payer: 59 | Admitting: Adult Health

## 2022-04-03 DIAGNOSIS — F331 Major depressive disorder, recurrent, moderate: Secondary | ICD-10-CM

## 2022-04-03 DIAGNOSIS — F319 Bipolar disorder, unspecified: Secondary | ICD-10-CM

## 2022-04-03 DIAGNOSIS — G4700 Insomnia, unspecified: Secondary | ICD-10-CM | POA: Diagnosis not present

## 2022-04-03 DIAGNOSIS — F900 Attention-deficit hyperactivity disorder, predominantly inattentive type: Secondary | ICD-10-CM

## 2022-04-03 MED ORDER — BUPROPION HCL ER (XL) 300 MG PO TB24: 300.0000 mg | ORAL_TABLET | Freq: Every day | ORAL | 2 refills | Status: DC

## 2022-04-03 MED ORDER — PERPHENAZINE 2 MG PO TABS: ORAL_TABLET | ORAL | 2 refills | Status: DC

## 2022-04-03 MED ORDER — METHYLPHENIDATE HCL 20 MG PO TABS: 20.0000 mg | ORAL_TABLET | Freq: Two times a day (BID) | ORAL | 0 refills | Status: DC

## 2022-04-03 MED ORDER — PERPHENAZINE 4 MG PO TABS: ORAL_TABLET | ORAL | 2 refills | Status: DC

## 2022-04-03 MED ORDER — AMITRIPTYLINE HCL 50 MG PO TABS: 50.0000 mg | ORAL_TABLET | Freq: Every day | ORAL | 2 refills | Status: DC

## 2022-04-03 NOTE — Progress Notes (Signed)
[name] [id number] 16-Feb-1986 36 y.o.  Subjective:   Patient ID:  Adam Larsen. is a 36 y.o. (DOB 1986/06/27) male.  Chief Complaint: No chief complaint on file.   HPI [name]. presents to the office today for follow-up of MDD, GAD, insomnia, paranoia, and ADHD.  Describes mood today as "ok". Pleasant. Denies tearfulness. Mood symptoms - denies depression, anxiety, and irritability. Mood is consistent. Stating "I feel like I'm pretty nuetral". Looking for a job - working with job assistance programs - [employer] and [employer]. Feels like medications are working well. Taking current medications as prescribed. Seeing [name] for therapy. Improved interest and motivation. Taking medications as prescribed.  Energy levels stable. Active, does not have a regular exercise routine. Enjoys some usual interests and activities. Single. Lives with parents. Spending time with family.  Appetite adequate. Weight gain 220 to 225 pounds. Sleeps well most nights. Averages 7 to 8 hours a night. Focus and concentration improved. Diagnosed with ADHD in childhood. Completing tasks around the house. Managing aspects of household.  Denies SI or HI.   Denies AH or VH.  Denies paranoia.  Previous medication: [name]   OEU2-3    Flowsheet Row ED from 05/16/2020 in [facility] EMERGENCY DEPARTMENT  PHQ-2 Total Score 6  PHQ-9 Total Score 17      Flowsheet Row ED from 04/19/2020 in [facility] EMERGENCY DEPARTMENT  C-SSRS RISK CATEGORY Error: Q2 is Yes, you must answer 3, 4, and 5        Review of Systems:  Review of Systems  Musculoskeletal:  Negative for gait problem.  Neurological:  Negative for tremors.  Psychiatric/Behavioral:         Please refer to HPI    Medications: I have reviewed the patient's current medications.  Current Outpatient Medications  Medication Sig Dispense Refill   amitriptyline (ELAVIL) 50 MG  tablet Take 1 tablet (50 mg total) by mouth at bedtime. 30 tablet 2   Ascorbic Acid (VITAMIN C) 1000 MG tablet Take 2,000 mg by mouth daily.     buPROPion (WELLBUTRIN XL) 300 MG 24 hr tablet Take 1 tablet (300 mg total) by mouth daily. 30 tablet 2   cholecalciferol (VITAMIN D3) 25 MCG (1000 UNIT) tablet Take 2,000 Units by mouth daily.     loratadine (CLARITIN) 10 MG tablet Take 1 tablet (10 mg total) by mouth daily. 30 tablet 0   methylphenidate (RITALIN) 20 MG tablet Take 1 tablet (20 mg total) by mouth 2 (two) times daily. 60 tablet 0   Multiple Vitamin (MULTIVITAMIN) capsule Take 1 capsule by mouth daily.     omega-3 acid ethyl esters (LOVAZA) 1 g capsule Take 1 capsule (1 g total) by mouth 2 (two) times daily. 60 capsule 0   Omega-3 Fatty Acids (FISH OIL) 1000 MG CAPS Take 1,000 mg by mouth daily.     perphenazine (TRILAFON) 2 MG tablet Take 1 tablet (2 mg) by mouth with a 4 mg tablet twice a day (total of 6 mg bid) 60 tablet 2   perphenazine (TRILAFON) 4 MG tablet Take 1 tablet (4 mg) by mouth with a 2 mg tablet twice a day. (6 mg bid) 60 tablet 2   No current facility-administered medications for this visit.    Medication Side Effects: None  Allergies: No Known Allergies  Past Medical History:  Diagnosis Date   Anxiety    Bipolar 1 disorder (HCC)    Depression  Mania (HCC)    Suicidal ideations     Past Medical History, Surgical history, Social history, and Family history were reviewed and updated as appropriate.   Please see review of systems for further details on the patient's review from today.   Objective:   Physical Exam:  There were no vitals taken for this visit.  Physical Exam Constitutional:      General: He is not in acute distress. Musculoskeletal:        General: No deformity.  Neurological:     Mental Status: He is alert and oriented to person, place, and time.     Coordination: Coordination normal.  Psychiatric:        Attention and Perception:  Attention and perception normal. He does not perceive auditory or visual hallucinations.        Mood and Affect: Mood normal. Mood is not anxious or depressed. Affect is not labile, blunt, angry or inappropriate.        Speech: Speech normal.        Behavior: Behavior normal.        Thought Content: Thought content normal. Thought content is not paranoid or delusional. Thought content does not include homicidal or suicidal ideation. Thought content does not include homicidal or suicidal plan.        Cognition and Memory: Cognition and memory normal.        Judgment: Judgment normal.     Comments: Insight intact     Lab Review:     Component Value Date/Time   NA 140 05/16/2020 0912   K 3.6 05/16/2020 0912   CL 105 05/16/2020 0912   CO2 24 05/16/2020 0912   GLUCOSE 202 (H) 05/16/2020 0912   BUN 11 05/16/2020 0912   CREATININE 1.05 05/16/2020 0912   CALCIUM 9.6 05/16/2020 0912   PROT 7.5 05/16/2020 0912   ALBUMIN 4.3 05/16/2020 0912   AST 20 05/16/2020 0912   ALT 27 05/16/2020 0912   ALKPHOS 73 05/16/2020 0912   BILITOT 0.7 05/16/2020 0912   GFRNONAA >60 05/16/2020 0912   GFRAA >60 05/16/2020 0912       Component Value Date/Time   WBC 12.5 (H) 05/16/2020 0912   RBC 5.12 05/16/2020 0912   HGB 15.3 05/16/2020 0912   HCT 45.6 05/16/2020 0912   PLT 392 05/16/2020 0912   MCV 89.1 05/16/2020 0912   MCH 29.9 05/16/2020 0912   MCHC 33.6 05/16/2020 0912   RDW 12.7 05/16/2020 0912   LYMPHSABS 2.4 04/19/2020 1252   MONOABS 0.8 04/19/2020 1252   EOSABS 0.0 04/19/2020 1252   BASOSABS 0.1 04/19/2020 1252    No results found for: "POCLITH", "LITHIUM"   No results found for: "PHENYTOIN", "PHENOBARB", "VALPROATE", "CBMZ"   .res Assessment: Plan:    Plan:  1. Perphenazine 6mg  in am and 6mg  at bedtime   2. Wellbutrin XL 300mg  daily 3. Amitriptyline 50mg  at hs - one tablet at bedtime. 4. Ritalin 20mg  BID  116/82/83  Printed Genes[employer] results for patient.  RTC 4  weeks  Discussed potential benefits, risks, and side effects of stimulants with patient to include increased heart rate, palpitations, insomnia, increased anxiety, increased irritability, or decreased appetite. Also discussed history of paranoia and to watch for any change in moods. Will also discuss changes with parents. Instructed patient to contact office if experiencing any significant tolerability issues.   Patient advised to contact office with any questions, adverse effects, or acute worsening in signs and symptoms.  Discussed potential metabolic side  effects associated with atypical antipsychotics, as well as potential risk for movement side effects. Advised pt to contact office if movement side effects occur.  Diagnoses and all orders for this visit:  Bipolar I disorder (HCC) -     perphenazine (TRILAFON) 2 MG tablet; Take 1 tablet (2 mg) by mouth with a 4 mg tablet twice a day (total of 6 mg bid) -     perphenazine (TRILAFON) 4 MG tablet; Take 1 tablet (4 mg) by mouth with a 2 mg tablet twice a day. (6 mg bid)  ADHD, predominantly inattentive type -     methylphenidate (RITALIN) 20 MG tablet; Take 1 tablet (20 mg total) by mouth 2 (two) times daily.  Major depressive disorder, recurrent episode, moderate (HCC) -     buPROPion (WELLBUTRIN XL) 300 MG 24 hr tablet; Take 1 tablet (300 mg total) by mouth daily.  Insomnia, unspecified type -     amitriptyline (ELAVIL) 50 MG tablet; Take 1 tablet (50 mg total) by mouth at bedtime.     Please see After Visit Summary for patient specific instructions.  Future Appointments  Date Time Provider Department Center  04/18/2022  8:00 AM [name] SessionAndrews, [facility] None    No orders of the defined types were placed in this encounter.   -------------------------------

## 2022-04-18 ENCOUNTER — Ambulatory Visit: Payer: 59 | Admitting: Mental Health

## 2022-05-01 ENCOUNTER — Ambulatory Visit (INDEPENDENT_AMBULATORY_CARE_PROVIDER_SITE_OTHER): Payer: 59 | Admitting: Adult Health

## 2022-05-01 ENCOUNTER — Encounter: Payer: Self-pay | Admitting: Adult Health

## 2022-05-01 DIAGNOSIS — F411 Generalized anxiety disorder: Secondary | ICD-10-CM

## 2022-05-01 DIAGNOSIS — F22 Delusional disorders: Secondary | ICD-10-CM

## 2022-05-01 DIAGNOSIS — F331 Major depressive disorder, recurrent, moderate: Secondary | ICD-10-CM | POA: Diagnosis not present

## 2022-05-01 DIAGNOSIS — F900 Attention-deficit hyperactivity disorder, predominantly inattentive type: Secondary | ICD-10-CM | POA: Diagnosis not present

## 2022-05-01 DIAGNOSIS — G4700 Insomnia, unspecified: Secondary | ICD-10-CM | POA: Diagnosis not present

## 2022-05-01 MED ORDER — METHYLPHENIDATE HCL 20 MG PO TABS: 20.0000 mg | ORAL_TABLET | Freq: Two times a day (BID) | ORAL | 0 refills | Status: DC

## 2022-05-01 NOTE — Progress Notes (Signed)
Adam Larsen [id number] Jun 26, 1986 36 y.o.  Subjective:   Patient ID:  Adam Clutter. is a 36 y.o. (DOB [birth year]) male.  Chief Complaint: No chief complaint on file.   HPI Adam Sellitto. presents to the office today for follow-up of MDD, GAD, insomnia, paranoia, and ADHD.  Describes mood today as "ok". Pleasant. Denies tearfulness. Mood symptoms - denies depression, anxiety, and irritability. Feels like he is making progress and moving forward.  Mood is consistent. Stating "I'm feeling pretty well".  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 Feels like medications are working well. Taking current medications as prescribed. Seeing [name] for therapy. Improved interest and motivation. Taking medications as prescribed.  Energy levels stable. Active, does not have a regular exercise routine. Enjoys some usual interests and activities. Single. Lives with parents. Spending time with family.  Appetite adequate. Weight gain 225 to 228 pounds. Sleeps well most nights. Averages 7 to 8 hours a night. Focus and concentration improved. Diagnosed with ADHD in childhood. Completing tasks around the house. Managing aspects of household.  Denies SI or HI.   Denies AH or VH.  Denies paranoia. Denies self harm. Denies substance use.   Previous medication: [name]   OLM7-8    Flowsheet Row ED from 05/16/2020 in [facility] EMERGENCY DEPARTMENT  PHQ-2 Total Score 6  PHQ-9 Total Score 17      Flowsheet Row ED from 04/19/2020 in [facility] EMERGENCY DEPARTMENT  C-SSRS RISK CATEGORY Error: Q2 is Yes, you must answer 3, 4, and 5        Review of Systems:  Review of Systems  Musculoskeletal:  Negative for gait problem.  Neurological:  Negative for tremors.  Psychiatric/Behavioral:         Please refer to HPI    Medications: I have reviewed the patient's current  medications.  Current Outpatient Medications  Medication Sig Dispense Refill   amitriptyline (ELAVIL) 50 MG tablet Take 1 tablet (50 mg total) by mouth at bedtime. 30 tablet 2   Ascorbic Acid (VITAMIN C) 1000 MG tablet Take 2,000 mg by mouth daily.     buPROPion (WELLBUTRIN XL) 300 MG 24 hr tablet Take 1 tablet (300 mg total) by mouth daily. 30 tablet 2   cholecalciferol (VITAMIN D3) 25 MCG (1000 UNIT) tablet Take 2,000 Units by mouth daily.     loratadine (CLARITIN) 10 MG tablet Take 1 tablet (10 mg total) by mouth daily. 30 tablet 0   methylphenidate (RITALIN) 20 MG tablet Take 1 tablet (20 mg total) by mouth 2 (two) times daily. 60 tablet 0   Multiple Vitamin (MULTIVITAMIN) capsule Take 1 capsule by mouth daily.     omega-3 acid ethyl esters (LOVAZA) 1 g capsule Take 1 capsule (1 g total) by mouth 2 (two) times daily. 60 capsule 0   Omega-3 Fatty Acids (FISH OIL) 1000 MG CAPS Take 1,000 mg by mouth daily.     perphenazine (TRILAFON) 2 MG tablet Take 1 tablet (2 mg) by mouth with a 4 mg tablet twice a day (total of 6 mg bid) 60 tablet 2   perphenazine (TRILAFON) 4 MG tablet Take 1 tablet (4 mg) by mouth with a 2 mg tablet twice a day. (6 mg bid) 60 tablet 2   No current facility-administered medications for this visit.    Medication Side Effects: None  Allergies: No Known Allergies  Past Medical History:  Diagnosis Date  Anxiety    Bipolar 1 disorder (HCC)    Depression    Mania (HCC)    Suicidal ideations     Past Medical History, Surgical history, Social history, and Family history were reviewed and updated as appropriate.   Please see review of systems for further details on the patient's review from today.   Objective:   Physical Exam:  There were no vitals taken for this visit.  Physical Exam Constitutional:      General: He is not in acute distress. Musculoskeletal:        General: No deformity.  Neurological:     Mental Status: He is alert and oriented to  person, place, and time.     Coordination: Coordination normal.  Psychiatric:        Attention and Perception: Attention and perception normal. He does not perceive auditory or visual hallucinations.        Mood and Affect: Mood normal. Mood is not anxious or depressed. Affect is not labile, blunt, angry or inappropriate.        Speech: Speech normal.        Behavior: Behavior normal.        Thought Content: Thought content normal. Thought content is not paranoid or delusional. Thought content does not include homicidal or suicidal ideation. Thought content does not include homicidal or suicidal plan.        Cognition and Memory: Cognition and memory normal.        Judgment: Judgment normal.     Comments: Insight intact     Lab Review:     Component Value Date/Time   NA 140 05/16/2020 0912   K 3.6 05/16/2020 0912   CL 105 05/16/2020 0912   CO2 24 05/16/2020 0912   GLUCOSE 202 (H) 05/16/2020 0912   BUN 11 05/16/2020 0912   CREATININE 1.05 05/16/2020 0912   CALCIUM 9.6 05/16/2020 0912   PROT 7.5 05/16/2020 0912   ALBUMIN 4.3 05/16/2020 0912   AST 20 05/16/2020 0912   ALT 27 05/16/2020 0912   ALKPHOS 73 05/16/2020 0912   BILITOT 0.7 05/16/2020 0912   GFRNONAA >60 05/16/2020 0912   GFRAA >60 05/16/2020 0912       Component Value Date/Time   WBC 12.5 (H) 05/16/2020 0912   RBC 5.12 05/16/2020 0912   HGB 15.3 05/16/2020 0912   HCT 45.6 05/16/2020 0912   PLT 392 05/16/2020 0912   MCV 89.1 05/16/2020 0912   MCH 29.9 05/16/2020 0912   MCHC 33.6 05/16/2020 0912   RDW 12.7 05/16/2020 0912   LYMPHSABS 2.4 04/19/2020 1252   MONOABS 0.8 04/19/2020 1252   EOSABS 0.0 04/19/2020 1252   BASOSABS 0.1 04/19/2020 1252    No results found for: "POCLITH", "LITHIUM"   No results found for: "PHENYTOIN", "PHENOBARB", "VALPROATE", "CBMZ"   .res Assessment: Plan:     Plan:  1. Perphenazine 6mg  in am and 6mg  at bedtime   2. Wellbutrin XL 300mg  daily 3. Amitriptyline 50mg  at hs - one  tablet at bedtime. 4. Ritalin 20mg  BID  129/84/84   Printed [employer] results for patient.  RTC 4 weeks  Discussed potential benefits, risks, and side effects of stimulants with patient to include increased heart rate, palpitations, insomnia, increased anxiety, increased irritability, or decreased appetite. Also discussed history of paranoia and to watch for any change in moods. Will also discuss changes with parents. Instructed patient to contact office if experiencing any significant tolerability issues.   Patient advised to contact office  with any questions, adverse effects, or acute worsening in signs and symptoms.  Discussed potential metabolic side effects associated with atypical antipsychotics, as well as potential risk for movement side effects. Advised pt to contact office if movement side effects occur.    Please see After Visit Summary for patient specific instructions.  Future Appointments  Date Time Provider Department Center  05/09/2022  8:00 AM [name] Session, [facility] CP-CP None    No orders of the defined types were placed in this encounter.   -------------------------------

## 2022-05-07 ENCOUNTER — Telehealth: Payer: Self-pay

## 2022-05-07 NOTE — Telephone Encounter (Signed)
Pt had previously been on [employer] but his insurance wasn't paying for it anymore, a PA was initiated for [employer] 18 mg with [facility] but it was canceled and no review was completed because this medication isn't on his plan formulary.   [name] is aware.

## 2022-05-09 ENCOUNTER — Ambulatory Visit (INDEPENDENT_AMBULATORY_CARE_PROVIDER_SITE_OTHER): Payer: 59 | Admitting: Mental Health

## 2022-05-09 DIAGNOSIS — F900 Attention-deficit hyperactivity disorder, predominantly inattentive type: Secondary | ICD-10-CM | POA: Diagnosis not present

## 2022-05-09 NOTE — Progress Notes (Addendum)
[facility] Note  Name: Adam Larsen. Date: 05/09/22 MRN: [id number]   DOB: 1986/10/17 PCP: Patient, No Pcp Per  Time spent: 55 minutes  Treatment:  Individual therapy  Mental Status Exam:    Appearance:   Casual     Behavior:  Appropriate  Motor:  Normal  Speech/Language:   Clear and Coherent  Affect:  full range  Mood:  Euthymic, pleasant  Thought process:  normal  Thought content:    WNL  Sensory/Perceptual disturbances:    none  Orientation:  x4  Attention:  Good  Concentration:  Good  Memory:  WNL  Fund of knowledge:   Good  Insight:    Good  Judgment:   Good  Impulse Control:  Good   Reported Symptoms:  Sleep problems-wakes in the middle of the night, low motivation, isolative, intermittent depressed mood, anxiety, struggles with daily hygiene (not showering, brushing teeth etc), self depreciating thoughts  Risk Assessment: Danger to Self:  No Self-injurious Behavior: No Danger to Others: No Duty to Warn:no Physical Aggression / Violence:No  Access to Firearms a concern: No  Gang Involvement:No  Patient / guardian was educated about steps to take if suicide or homicide risk level increases between visits: yes While future psychiatric events cannot be accurately predicted, the patient does not currently require acute inpatient psychiatric care and does not currently meet [facility] involuntary commitment criteria.    Medications: Current Outpatient Medications  Medication Sig Dispense Refill   amitriptyline (ELAVIL) 50 MG tablet Take 1 tablet (50 mg total) by mouth at bedtime. 30 tablet 2   Ascorbic Acid (VITAMIN C) 1000 MG tablet Take 2,000 mg by mouth daily.     buPROPion (WELLBUTRIN XL) 300 MG 24 hr tablet Take 1 tablet (300 mg total) by mouth daily. 30 tablet 2   cholecalciferol (VITAMIN D3) 25 MCG (1000 UNIT) tablet Take 2,000 Units by mouth daily.     loratadine (CLARITIN) 10 MG tablet Take 1 tablet (10 mg total) by mouth daily. 30  tablet 0   methylphenidate (RITALIN) 20 MG tablet Take 1 tablet (20 mg total) by mouth 2 (two) times daily. 60 tablet 0   Multiple Vitamin (MULTIVITAMIN) capsule Take 1 capsule by mouth daily.     omega-3 acid ethyl esters (LOVAZA) 1 g capsule Take 1 capsule (1 g total) by mouth 2 (two) times daily. 60 capsule 0   Omega-3 Fatty Acids (FISH OIL) 1000 MG CAPS Take 1,000 mg by mouth daily.     perphenazine (TRILAFON) 2 MG tablet Take 1 tablet (2 mg) by mouth with a 4 mg tablet twice a day (total of 6 mg bid) 60 tablet 2   perphenazine (TRILAFON) 4 MG tablet Take 1 tablet (4 mg) by mouth with a 2 mg tablet twice a day. (6 mg bid) 60 tablet 2   No current facility-administered medications for this visit.    Subjective: Patient presents for session on time.  Has been approximately 2 months since his last session.  Time was spent assessing recent events and progress toward his obtaining a job.  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  He reports he has been on some recent interviews and shared details, experiences.  He identified anxiety related to the process particularly when going to the interview.  Worked with patient to identify thoughts and reframe to begin to manage his anxiety as well as engaging in some problem  solving collaboratively.  Patient plans to follow through with identifying and utilizing resources to prepare for interviews as this was not occurring.  He expressed concern about his brother as he continues to have his own challenges with his mental health and how it is affecting family.   Interventions: CBT, supportive therapy, problem solving strategies  Diagnoses:    ICD-10-CM   1. ADHD, predominantly inattentive type  F90.0           Plan: Patient is to use CBT, mindfulness and coping skills to help decrease symptoms associated with their diagnosis.  Patient to look for employment  to obtain independence.    Long-term goal:   Reduce overall level, frequency, and intensity of the feelings of depression and anxiety 7-8/10 to a 0-2/10 in severity for at least 3 consecutive months.   Short-term goal:  Decrease anxiety producing self talk such as thinking of the worse possible life outcome Decrease ruminating, which can affect his sleep and increased emotional distress Continue to take medications as prescribed Increased self-confidence by engaging in an exercise regimen, working out Increase steps toward independence by finding employment and eventually moving out of this parent's home    Assessment of progress:  progressing  [name] Session, [facility]

## 2022-05-29 ENCOUNTER — Ambulatory Visit (INDEPENDENT_AMBULATORY_CARE_PROVIDER_SITE_OTHER): Payer: 59 | Admitting: Adult Health

## 2022-05-29 ENCOUNTER — Encounter: Payer: Self-pay | Admitting: Adult Health

## 2022-05-29 DIAGNOSIS — F331 Major depressive disorder, recurrent, moderate: Secondary | ICD-10-CM

## 2022-05-29 DIAGNOSIS — G4700 Insomnia, unspecified: Secondary | ICD-10-CM

## 2022-05-29 DIAGNOSIS — F900 Attention-deficit hyperactivity disorder, predominantly inattentive type: Secondary | ICD-10-CM | POA: Diagnosis not present

## 2022-05-29 DIAGNOSIS — F319 Bipolar disorder, unspecified: Secondary | ICD-10-CM | POA: Diagnosis not present

## 2022-05-29 MED ORDER — PERPHENAZINE 4 MG PO TABS: ORAL_TABLET | ORAL | 2 refills | Status: DC

## 2022-05-29 MED ORDER — AMITRIPTYLINE HCL 50 MG PO TABS: 50.0000 mg | ORAL_TABLET | Freq: Every day | ORAL | 2 refills | Status: DC

## 2022-05-29 MED ORDER — METHYLPHENIDATE HCL 20 MG PO TABS
20.0000 mg | ORAL_TABLET | Freq: Every day | ORAL | 0 refills | Status: DC
Start: 2022-05-29 — End: 2022-06-26

## 2022-05-29 MED ORDER — PERPHENAZINE 2 MG PO TABS: ORAL_TABLET | ORAL | 2 refills | Status: DC

## 2022-05-29 MED ORDER — BUPROPION HCL ER (XL) 300 MG PO TB24: 300.0000 mg | ORAL_TABLET | Freq: Every day | ORAL | 2 refills | Status: DC

## 2022-05-29 MED ORDER — METHYLPHENIDATE HCL ER (OSM) 36 MG PO TBCR: 36.0000 mg | EXTENDED_RELEASE_TABLET | Freq: Every day | ORAL | 0 refills | Status: DC

## 2022-05-29 NOTE — Progress Notes (Signed)
Adam Larsen [id number] [birth year] 36 y.o.  Subjective:   Patient ID:  Adam Larsen. is a 36 y.o. (DOB [birth year]) male.  Chief Complaint: No chief complaint on file.   HPI Adam Larsen. presents to the office today for follow-up of MDD, GAD, insomnia, paranoia, and ADHD.  Describes mood today as "ok". Pleasant. Denies tearfulness. Mood symptoms - denies depression, anxiety, and irritability. Mood is consistent. Stating "I'm kind of in a neutral place". Reports some frustration with not finding a job. 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 Feels like medications are working well. Would like to try a long acting Ritalin. Taking current medications as prescribed. Seeing [name] for therapy. Improved interest and motivation.Taking medications as prescribed.  Energy levels stable. Active, does not have a regular exercise routine. Enjoys some usual interests and activities. Single. Lives with parents. Spending time with family.  Appetite adequate. Weight gain 225 to 228 pounds. Sleeps well most nights. Averages 7 to 8 hours a night. Focus and concentration improved. Diagnosed with ADHD in childhood. Completing tasks around the house. Managing aspects of household.  Denies SI or HI.   Denies AH or VH.  Denies paranoia. Denies self harm. Denies substance use.   Previous medication: [name]   HYI5-0    Flowsheet Row ED from 05/16/2020 in [facility] EMERGENCY DEPARTMENT  PHQ-2 Total Score 6  PHQ-9 Total Score 17      Flowsheet Row ED from 04/19/2020 in [facility] EMERGENCY DEPARTMENT  C-SSRS RISK CATEGORY Error: Q2 is Yes, you must answer 3, 4, and 5        Review of Systems:  Review of Systems  Musculoskeletal:  Negative for gait problem.  Neurological:  Negative for tremors.  Psychiatric/Behavioral:         Please refer to HPI    Medications: I have reviewed the patient's current  medications.  Current Outpatient Medications  Medication Sig Dispense Refill   amitriptyline (ELAVIL) 50 MG tablet Take 1 tablet (50 mg total) by mouth at bedtime. 30 tablet 2   Ascorbic Acid (VITAMIN C) 1000 MG tablet Take 2,000 mg by mouth daily.     buPROPion (WELLBUTRIN XL) 300 MG 24 hr tablet Take 1 tablet (300 mg total) by mouth daily. 30 tablet 2   cholecalciferol (VITAMIN D3) 25 MCG (1000 UNIT) tablet Take 2,000 Units by mouth daily.     loratadine (CLARITIN) 10 MG tablet Take 1 tablet (10 mg total) by mouth daily. 30 tablet 0   methylphenidate (RITALIN) 20 MG tablet Take 1 tablet (20 mg total) by mouth 2 (two) times daily. 60 tablet 0   Multiple Vitamin (MULTIVITAMIN) capsule Take 1 capsule by mouth daily.     omega-3 acid ethyl esters (LOVAZA) 1 g capsule Take 1 capsule (1 g total) by mouth 2 (two) times daily. 60 capsule 0   Omega-3 Fatty Acids (FISH OIL) 1000 MG CAPS Take 1,000 mg by mouth daily.     perphenazine (TRILAFON) 2 MG tablet Take 1 tablet (2 mg) by mouth with a 4 mg tablet twice a day (total of 6 mg bid) 60 tablet 2   perphenazine (TRILAFON) 4 MG tablet Take 1 tablet (4 mg) by mouth with a 2 mg tablet twice a day. (6 mg bid) 60 tablet 2   No current facility-administered medications for this visit.    Medication Side Effects: None  Allergies: No Known Allergies  Past Medical History:  Diagnosis  Date   Anxiety    Bipolar 1 disorder (HCC)    Depression    Mania (HCC)    Suicidal ideations     Past Medical History, Surgical history, Social history, and Family history were reviewed and updated as appropriate.   Please see review of systems for further details on the patient's review from today.   Objective:   Physical Exam:  There were no vitals taken for this visit.  Physical Exam Constitutional:      General: He is not in acute distress. Musculoskeletal:        General: No deformity.  Neurological:     Mental Status: He is alert and oriented to  person, place, and time.     Coordination: Coordination normal.  Psychiatric:        Attention and Perception: Attention and perception normal. He does not perceive auditory or visual hallucinations.        Mood and Affect: Mood normal. Mood is not anxious or depressed. Affect is not labile, blunt, angry or inappropriate.        Speech: Speech normal.        Behavior: Behavior normal.        Thought Content: Thought content normal. Thought content is not paranoid or delusional. Thought content does not include homicidal or suicidal ideation. Thought content does not include homicidal or suicidal plan.        Cognition and Memory: Cognition and memory normal.        Judgment: Judgment normal.     Comments: Insight intact     Lab Review:     Component Value Date/Time   NA 140 05/16/2020 0912   K 3.6 05/16/2020 0912   CL 105 05/16/2020 0912   CO2 24 05/16/2020 0912   GLUCOSE 202 (H) 05/16/2020 0912   BUN 11 05/16/2020 0912   CREATININE 1.05 05/16/2020 0912   CALCIUM 9.6 05/16/2020 0912   PROT 7.5 05/16/2020 0912   ALBUMIN 4.3 05/16/2020 0912   AST 20 05/16/2020 0912   ALT 27 05/16/2020 0912   ALKPHOS 73 05/16/2020 0912   BILITOT 0.7 05/16/2020 0912   GFRNONAA >60 05/16/2020 0912   GFRAA >60 05/16/2020 0912       Component Value Date/Time   WBC 12.5 (H) 05/16/2020 0912   RBC 5.12 05/16/2020 0912   HGB 15.3 05/16/2020 0912   HCT 45.6 05/16/2020 0912   PLT 392 05/16/2020 0912   MCV 89.1 05/16/2020 0912   MCH 29.9 05/16/2020 0912   MCHC 33.6 05/16/2020 0912   RDW 12.7 05/16/2020 0912   LYMPHSABS 2.4 04/19/2020 1252   MONOABS 0.8 04/19/2020 1252   EOSABS 0.0 04/19/2020 1252   BASOSABS 0.1 04/19/2020 1252    No results found for: "POCLITH", "LITHIUM"   No results found for: "PHENYTOIN", "PHENOBARB", "VALPROATE", "CBMZ"   .res Assessment: Plan:    Plan:  1. Perphenazine 6mg  in am and 6mg  at bedtime   2. Wellbutrin XL 300mg  daily 3. Amitriptyline 50mg  at hs - one  tablet at bedtime. 4. Ritalin 20mg  at lunch 5. Add Concerta 36mg  every morning.    RTC 4 weeks  Discussed potential benefits, risks, and side effects of stimulants with patient to include increased heart rate, palpitations, insomnia, increased anxiety, increased irritability, or decreased appetite. Also discussed history of paranoia and to watch for any change in moods. Will also discuss changes with parents. Instructed patient to contact office if experiencing any significant tolerability issues.   Patient advised to  contact office with any questions, adverse effects, or acute worsening in signs and symptoms.  Discussed potential metabolic side effects associated with atypical antipsychotics, as well as potential risk for movement side effects. Advised pt to contact office if movement side effects occur.   There are no diagnoses linked to this encounter.   Please see After Visit Summary for patient specific instructions.  Future Appointments  Date Time Provider Department Center  06/11/2022  9:00 AM [name] Session, [facility] None    No orders of the defined types were placed in this encounter.   -------------------------------

## 2022-06-11 ENCOUNTER — Ambulatory Visit: Payer: 59 | Admitting: Mental Health

## 2022-06-26 ENCOUNTER — Encounter: Payer: Self-pay | Admitting: Adult Health

## 2022-06-26 ENCOUNTER — Ambulatory Visit (INDEPENDENT_AMBULATORY_CARE_PROVIDER_SITE_OTHER): Payer: 59 | Admitting: Adult Health

## 2022-06-26 DIAGNOSIS — G4700 Insomnia, unspecified: Secondary | ICD-10-CM

## 2022-06-26 DIAGNOSIS — F900 Attention-deficit hyperactivity disorder, predominantly inattentive type: Secondary | ICD-10-CM

## 2022-06-26 DIAGNOSIS — R69 Illness, unspecified: Secondary | ICD-10-CM | POA: Diagnosis not present

## 2022-06-26 DIAGNOSIS — F22 Delusional disorders: Secondary | ICD-10-CM | POA: Diagnosis not present

## 2022-06-26 DIAGNOSIS — F331 Major depressive disorder, recurrent, moderate: Secondary | ICD-10-CM

## 2022-06-26 DIAGNOSIS — F411 Generalized anxiety disorder: Secondary | ICD-10-CM | POA: Diagnosis not present

## 2022-06-26 MED ORDER — METHYLPHENIDATE HCL 20 MG PO TABS: 20.0000 mg | ORAL_TABLET | Freq: Every day | ORAL | 0 refills | Status: DC

## 2022-06-26 MED ORDER — METHYLPHENIDATE HCL ER (OSM) 36 MG PO TBCR: 36.0000 mg | EXTENDED_RELEASE_TABLET | Freq: Every day | ORAL | 0 refills | Status: DC

## 2022-06-26 NOTE — Progress Notes (Signed)
Saunders [place] [id number] Feb 22, 1986 36 y.o.  Subjective:   Patient ID:  Adam Larsen. is a 36 y.o. (DOB [birth year]) male.  Chief Complaint: No chief complaint on file.   HPI Adam Larsen. presents to the office today for follow-up of MDD, GAD, insomnia, paranoia, and ADHD.  Describes mood today as "ok". Pleasant. Denies tearfulness. Mood symptoms - denies depression, anxiety, and irritability. Mood is consistent. Stating "I'm frustrated with trying to get my life going in the right direction". 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 Looking at [employer]. Feels like medications are working well. Seeing [name] for therapy. Improved interest and motivation.Taking medications as prescribed.  Energy levels stable. Active, does not have a regular exercise routine. Started walking a few days ago. Enjoys some usual interests and activities. Single. Lives with parents. Spending time with family.  Appetite adequate. Weight gain 230 pounds. Sleeps well most nights. Averages 7 to 8 hours a night. Focus and concentration stable. Diagnosed with ADHD in childhood. Completing tasks around the house. Managing aspects of household.  Denies SI or HI.   Denies AH or VH.  Denies paranoia. Denies self harm. Denies substance use.   Previous medication: [name]   XJO8-3    Flowsheet Row ED from 05/16/2020 in [facility] EMERGENCY DEPARTMENT  PHQ-2 Total Score 6  PHQ-9 Total Score 17      Flowsheet Row ED from 04/19/2020 in [facility] EMERGENCY DEPARTMENT  C-SSRS RISK CATEGORY Error: Q2 is Yes, you must answer 3, 4, and 5        Review of Systems:  Review of Systems  Musculoskeletal:  Negative for gait problem.  Neurological:  Negative for tremors.  Psychiatric/Behavioral:         Please refer to HPI    Medications: I have reviewed the patient's current medications.  Current Outpatient Medications  Medication Sig  Dispense Refill   amitriptyline (ELAVIL) 50 MG tablet Take 1 tablet (50 mg total) by mouth at bedtime. 30 tablet 2   Ascorbic Acid (VITAMIN C) 1000 MG tablet Take 2,000 mg by mouth daily.     buPROPion (WELLBUTRIN XL) 300 MG 24 hr tablet Take 1 tablet (300 mg total) by mouth daily. 30 tablet 2   cholecalciferol (VITAMIN D3) 25 MCG (1000 UNIT) tablet Take 2,000 Units by mouth daily.     loratadine (CLARITIN) 10 MG tablet Take 1 tablet (10 mg total) by mouth daily. 30 tablet 0   methylphenidate (CONCERTA) 36 MG PO CR tablet Take 1 tablet (36 mg total) by mouth daily. 30 tablet 0   methylphenidate (RITALIN) 20 MG tablet Take 1 tablet (20 mg total) by mouth daily in the afternoon. 30 tablet 0   Multiple Vitamin (MULTIVITAMIN) capsule Take 1 capsule by mouth daily.     omega-3 acid ethyl esters (LOVAZA) 1 g capsule Take 1 capsule (1 g total) by mouth 2 (two) times daily. 60 capsule 0   Omega-3 Fatty Acids (FISH OIL) 1000 MG CAPS Take 1,000 mg by mouth daily.     perphenazine (TRILAFON) 2 MG tablet Take 1 tablet (2 mg) by mouth with a 4 mg tablet twice a day (total of 6 mg bid) 60 tablet 2   perphenazine (TRILAFON) 4 MG tablet Take 1 tablet (4 mg) by mouth with a 2 mg tablet twice a day. (6 mg bid) 60 tablet 2   No current facility-administered medications for this visit.    Medication Side  Effects: None  Allergies: No Known Allergies  Past Medical History:  Diagnosis Date   Anxiety    Bipolar 1 disorder (HCC)    Depression    Mania (HCC)    Suicidal ideations     Past Medical History, Surgical history, Social history, and Family history were reviewed and updated as appropriate.   Please see review of systems for further details on the patient's review from today.   Objective:   Physical Exam:  There were no vitals taken for this visit.  Physical Exam Constitutional:      General: He is not in acute distress. Musculoskeletal:        General: No deformity.  Neurological:      Mental Status: He is alert and oriented to person, place, and time.     Coordination: Coordination normal.  Psychiatric:        Attention and Perception: Attention and perception normal. He does not perceive auditory or visual hallucinations.        Mood and Affect: Mood normal. Mood is not anxious or depressed. Affect is not labile, blunt, angry or inappropriate.        Speech: Speech normal.        Behavior: Behavior normal.        Thought Content: Thought content normal. Thought content is not paranoid or delusional. Thought content does not include homicidal or suicidal ideation. Thought content does not include homicidal or suicidal plan.        Cognition and Memory: Cognition and memory normal.        Judgment: Judgment normal.     Comments: Insight intact     Lab Review:     Component Value Date/Time   NA 140 05/16/2020 0912   K 3.6 05/16/2020 0912   CL 105 05/16/2020 0912   CO2 24 05/16/2020 0912   GLUCOSE 202 (H) 05/16/2020 0912   BUN 11 05/16/2020 0912   CREATININE 1.05 05/16/2020 0912   CALCIUM 9.6 05/16/2020 0912   PROT 7.5 05/16/2020 0912   ALBUMIN 4.3 05/16/2020 0912   AST 20 05/16/2020 0912   ALT 27 05/16/2020 0912   ALKPHOS 73 05/16/2020 0912   BILITOT 0.7 05/16/2020 0912   GFRNONAA >60 05/16/2020 0912   GFRAA >60 05/16/2020 0912       Component Value Date/Time   WBC 12.5 (H) 05/16/2020 0912   RBC 5.12 05/16/2020 0912   HGB 15.3 05/16/2020 0912   HCT 45.6 05/16/2020 0912   PLT 392 05/16/2020 0912   MCV 89.1 05/16/2020 0912   MCH 29.9 05/16/2020 0912   MCHC 33.6 05/16/2020 0912   RDW 12.7 05/16/2020 0912   LYMPHSABS 2.4 04/19/2020 1252   MONOABS 0.8 04/19/2020 1252   EOSABS 0.0 04/19/2020 1252   BASOSABS 0.1 04/19/2020 1252    No results found for: "POCLITH", "LITHIUM"   No results found for: "PHENYTOIN", "PHENOBARB", "VALPROATE", "CBMZ"   .res Assessment: Plan:    Plan:  1. Perphenazine 6mg  in am and 6mg  at bedtime   2. Wellbutrin XL 300mg   daily 3. Amitriptyline 50mg  at hs - one tablet at bedtime. 4. Ritalin 20mg  at lunch 5. Concerta 36mg  every morning.  116/86/77  RTC 4 weeks  Discussed potential benefits, risks, and side effects of stimulants with patient to include increased heart rate, palpitations, insomnia, increased anxiety, increased irritability, or decreased appetite. Also discussed history of paranoia and to watch for any change in moods. Will also discuss changes with parents. Instructed patient to contact  office if experiencing any significant tolerability issues.   Patient advised to contact office with any questions, adverse effects, or acute worsening in signs and symptoms.  Discussed potential metabolic side effects associated with atypical antipsychotics, as well as potential risk for movement side effects. Advised pt to contact office if movement side effects occur.    There are no diagnoses linked to this encounter.   Please see After Visit Summary for patient specific instructions.  No future appointments.  No orders of the defined types were placed in this encounter.   -------------------------------

## 2022-07-16 ENCOUNTER — Ambulatory Visit (INDEPENDENT_AMBULATORY_CARE_PROVIDER_SITE_OTHER): Payer: 59 | Admitting: Mental Health

## 2022-07-16 DIAGNOSIS — F331 Major depressive disorder, recurrent, moderate: Secondary | ICD-10-CM | POA: Diagnosis not present

## 2022-07-16 DIAGNOSIS — R69 Illness, unspecified: Secondary | ICD-10-CM | POA: Diagnosis not present

## 2022-07-16 DIAGNOSIS — F900 Attention-deficit hyperactivity disorder, predominantly inattentive type: Secondary | ICD-10-CM

## 2022-07-16 NOTE — Progress Notes (Signed)
[facility] Note  Name: Adam Larsen. Date: 07/16/22 MRN: [id number]   DOB: Oct 26, 1985 PCP: Patient, No Pcp Per  Time spent: 54 minutes  Treatment:  Individual therapy  Mental Status Exam:    Appearance:   Casual     Behavior:  Appropriate  Motor:  Normal  Speech/Language:   Clear and Coherent  Affect:  full range  Mood:  Euthymic, pleasant  Thought process:  normal  Thought content:    WNL  Sensory/Perceptual disturbances:    none  Orientation:  x4  Attention:  Good  Concentration:  Good  Memory:  WNL  Fund of knowledge:   Good  Insight:    Good  Judgment:   Good  Impulse Control:  Good   Reported Symptoms:  Sleep problems-wakes in the middle of the night, low motivation, isolative, intermittent depressed mood, anxiety, struggles with daily hygiene (not showering, brushing teeth etc), self depreciating thoughts  Risk Assessment: Danger to Self:  No Self-injurious Behavior: No Danger to Others: No Duty to Warn:no Physical Aggression / Violence:No  Access to Firearms a concern: No  Gang Involvement:No  Patient / guardian was educated about steps to take if suicide or homicide risk level increases between visits: yes While future psychiatric events cannot be accurately predicted, the patient does not currently require acute inpatient psychiatric care and does not currently meet [facility] involuntary commitment criteria.    Medications: Current Outpatient Medications  Medication Sig Dispense Refill   amitriptyline (ELAVIL) 50 MG tablet Take 1 tablet (50 mg total) by mouth at bedtime. 30 tablet 2   Ascorbic Acid (VITAMIN C) 1000 MG tablet Take 2,000 mg by mouth daily.     buPROPion (WELLBUTRIN XL) 300 MG 24 hr tablet Take 1 tablet (300 mg total) by mouth daily. 30 tablet 2   cholecalciferol (VITAMIN D3) 25 MCG (1000 UNIT) tablet Take 2,000 Units by mouth daily.     loratadine (CLARITIN) 10 MG tablet Take 1 tablet (10 mg total) by mouth daily. 30  tablet 0   methylphenidate (CONCERTA) 36 MG PO CR tablet Take 1 tablet (36 mg total) by mouth daily. 30 tablet 0   methylphenidate (RITALIN) 20 MG tablet Take 1 tablet (20 mg total) by mouth daily in the afternoon. 30 tablet 0   Multiple Vitamin (MULTIVITAMIN) capsule Take 1 capsule by mouth daily.     omega-3 acid ethyl esters (LOVAZA) 1 g capsule Take 1 capsule (1 g total) by mouth 2 (two) times daily. 60 capsule 0   Omega-3 Fatty Acids (FISH OIL) 1000 MG CAPS Take 1,000 mg by mouth daily.     perphenazine (TRILAFON) 2 MG tablet Take 1 tablet (2 mg) by mouth with a 4 mg tablet twice a day (total of 6 mg bid) 60 tablet 2   perphenazine (TRILAFON) 4 MG tablet Take 1 tablet (4 mg) by mouth with a 2 mg tablet twice a day. (6 mg bid) 60 tablet 2   No current facility-administered medications for this visit.    Subjective: Patient presents for session on time.  Assess progress, events since last visit which was about 2 months ago.  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  Facilitated his identifying needs and subsequent barriers.  Assisted him in reframing thoughts related to his being able to attend school at his current age which he identified as a barrier along with a lack of discipline.  He continues to identify some sources of distress related to his mental health issues, family relationships and his lack of independence.  He expresses wanting to address his lack of independence and ultimately be able to live independently, potentially have a wife and child at some point.    Interventions: CBT, supportive therapy, problem solving strategies  Diagnoses:    ICD-10-CM   1. ADHD, predominantly inattentive type  F90.0     2. Major  depressive disorder, recurrent episode, moderate ([place])  F33.1            Plan: Patient is to use CBT, mindfulness and coping skills to help decrease symptoms associated with their diagnosis.  Patient to look into employment options as well as college courses to eventually obtain more independence.    Long-term goal:   Reduce overall level, frequency, and intensity of the feelings of depression and anxiety 7-8/10 to a 0-2/10 in severity for at least 3 consecutive months.   Short-term goal:  Decrease anxiety producing self talk such as thinking of the worse possible life outcome Decrease ruminating, which can affect his sleep and increased emotional distress Continue to take medications as prescribed Increased self-confidence by engaging in an exercise regimen, working out Increase steps toward independence by finding employment and eventually moving out of this parent's home    Assessment of progress:  progressing  [place], [facility]

## 2022-07-24 ENCOUNTER — Ambulatory Visit (INDEPENDENT_AMBULATORY_CARE_PROVIDER_SITE_OTHER): Payer: 59 | Admitting: Adult Health

## 2022-07-24 ENCOUNTER — Encounter: Payer: Self-pay | Admitting: Adult Health

## 2022-07-24 DIAGNOSIS — F22 Delusional disorders: Secondary | ICD-10-CM

## 2022-07-24 DIAGNOSIS — F411 Generalized anxiety disorder: Secondary | ICD-10-CM

## 2022-07-24 DIAGNOSIS — F331 Major depressive disorder, recurrent, moderate: Secondary | ICD-10-CM | POA: Diagnosis not present

## 2022-07-24 DIAGNOSIS — F900 Attention-deficit hyperactivity disorder, predominantly inattentive type: Secondary | ICD-10-CM | POA: Diagnosis not present

## 2022-07-24 DIAGNOSIS — G4700 Insomnia, unspecified: Secondary | ICD-10-CM | POA: Diagnosis not present

## 2022-07-24 DIAGNOSIS — R69 Illness, unspecified: Secondary | ICD-10-CM | POA: Diagnosis not present

## 2022-07-24 MED ORDER — METHYLPHENIDATE HCL ER (OSM) 36 MG PO TBCR: 36.0000 mg | EXTENDED_RELEASE_TABLET | Freq: Every day | ORAL | 0 refills | Status: DC

## 2022-07-24 MED ORDER — METHYLPHENIDATE HCL 20 MG PO TABS: 20.0000 mg | ORAL_TABLET | Freq: Every day | ORAL | 0 refills | Status: DC

## 2022-07-24 NOTE — Progress Notes (Signed)
Adam Larsen [id number] Jan 12, 1986 36 y.o.  Subjective:   Patient ID:  Adam Larsen. is a 36 y.o. (DOB 1986-03-17) male.  Chief Complaint: No chief complaint on file.   HPI Adam Larsen. presents to the office today for follow-up of MDD, GAD, insomnia, paranoia, and ADHD.  Describes mood today as "ok". Pleasant. Denies tearfulness. Mood symptoms - denies depression, anxiety, and irritability. Mood is consistent. Stating "I'm doing pretty good". Considering options for school and work - leaning more towards returning to school. Looking at [employer]. Feels like medications continue to work.. Seeing Adam Larsen for therapy. Improved interest and motivation.Taking medications as prescribed.  Energy levels stable. Active, does not have a regular exercise routine.  Enjoys some usual interests and activities. Single. Lives with parents. Spending time with family.  Appetite adequate. Weight gain 230 pounds. Sleeps well most nights. Averages 7 to 8 hours a night. Focus and concentration stable. Diagnosed with ADHD in childhood. Completing tasks around the house. Managing aspects of household.  Denies SI or HI.   Denies AH or VH.  Denies paranoia. Denies self harm. Denies substance use.   Previous medication: Adam Larsen   [id number]    Flowsheet Row ED from 05/16/2020 in [place]  PHQ-2 Total Score 6  PHQ-9 Total Score 17      Flowsheet Row ED from 04/19/2020 in [place] CATEGORY Error: Q2 is Yes, you must answer 3, 4, and 5        Review of Systems:  Review of Systems  Musculoskeletal:  Negative for gait problem.  Neurological:  Negative for tremors.  Psychiatric/Behavioral:         Please refer to HPI    Medications: I have reviewed the patient's current medications.  Current Outpatient Medications  Medication Sig Dispense Refill   amitriptyline  (ELAVIL) 50 MG tablet Take 1 tablet (50 mg total) by mouth at bedtime. 30 tablet 2   Ascorbic Acid (VITAMIN C) 1000 MG tablet Take 2,000 mg by mouth daily.     buPROPion (WELLBUTRIN XL) 300 MG 24 hr tablet Take 1 tablet (300 mg total) by mouth daily. 30 tablet 2   cholecalciferol (VITAMIN D3) 25 MCG (1000 UNIT) tablet Take 2,000 Units by mouth daily.     loratadine (CLARITIN) 10 MG tablet Take 1 tablet (10 mg total) by mouth daily. 30 tablet 0   methylphenidate (CONCERTA) 36 MG PO CR tablet Take 1 tablet (36 mg total) by mouth daily. 30 tablet 0   methylphenidate (RITALIN) 20 MG tablet Take 1 tablet (20 mg total) by mouth daily in the afternoon. 30 tablet 0   Multiple Vitamin (MULTIVITAMIN) capsule Take 1 capsule by mouth daily.     omega-3 acid ethyl esters (LOVAZA) 1 g capsule Take 1 capsule (1 g total) by mouth 2 (two) times daily. 60 capsule 0   Omega-3 Fatty Acids (FISH OIL) 1000 MG CAPS Take 1,000 mg by mouth daily.     perphenazine (TRILAFON) 2 MG tablet Take 1 tablet (2 mg) by mouth with a 4 mg tablet twice a day (total of 6 mg bid) 60 tablet 2   perphenazine (TRILAFON) 4 MG tablet Take 1 tablet (4 mg) by mouth with a 2 mg tablet twice a day. (6 mg bid) 60 tablet 2   No current facility-administered medications for this visit.    Medication Side Effects: None  Allergies: No Known Allergies  Past  Medical History:  Diagnosis Date   Anxiety    Bipolar 1 disorder ([place])    Depression    Mania ([place])    Suicidal ideations     Past Medical History, Surgical history, Social history, and Family history were reviewed and updated as appropriate.   Please see review of systems for further details on the patient's review from today.   Objective:   Physical Exam:  There were no vitals taken for this visit.  Physical Exam Constitutional:      General: He is not in acute distress. Musculoskeletal:        General: No deformity.  Neurological:     Mental Status: He is alert and  oriented to person, place, and time.     Coordination: Coordination normal.  Psychiatric:        Attention and Perception: Attention and perception normal. He does not perceive auditory or visual hallucinations.        Mood and Affect: Mood normal. Mood is not anxious or depressed. Affect is not labile, blunt, angry or inappropriate.        Speech: Speech normal.        Behavior: Behavior normal.        Thought Content: Thought content normal. Thought content is not paranoid or delusional. Thought content does not include homicidal or suicidal ideation. Thought content does not include homicidal or suicidal plan.        Cognition and Memory: Cognition and memory normal.        Judgment: Judgment normal.     Comments: Insight intact     Lab Review:     Component Value Date/Time   NA 140 05/16/2020 0912   K 3.6 05/16/2020 0912   CL 105 05/16/2020 0912   CO2 24 05/16/2020 0912   GLUCOSE 202 (H) 05/16/2020 0912   BUN 11 05/16/2020 0912   CREATININE 1.05 05/16/2020 0912   CALCIUM 9.6 05/16/2020 0912   PROT 7.5 05/16/2020 0912   ALBUMIN 4.3 05/16/2020 0912   AST 20 05/16/2020 0912   ALT 27 05/16/2020 0912   ALKPHOS 73 05/16/2020 0912   BILITOT 0.7 05/16/2020 0912   GFRNONAA >60 05/16/2020 0912   GFRAA >60 05/16/2020 0912       Component Value Date/Time   WBC 12.5 (H) 05/16/2020 0912   RBC 5.12 05/16/2020 0912   HGB 15.3 05/16/2020 0912   HCT 45.6 05/16/2020 0912   PLT 392 05/16/2020 0912   MCV 89.1 05/16/2020 0912   MCH 29.9 05/16/2020 0912   MCHC 33.6 05/16/2020 0912   RDW 12.7 05/16/2020 0912   LYMPHSABS 2.4 04/19/2020 1252   MONOABS 0.8 04/19/2020 1252   EOSABS 0.0 04/19/2020 1252   BASOSABS 0.1 04/19/2020 1252    No results found for: "POCLITH", "LITHIUM"   No results found for: "PHENYTOIN", "PHENOBARB", "VALPROATE", "CBMZ"   .res Assessment: Plan:    Plan:  1. Perphenazine 6mg  in am and 6mg  at bedtime   2. Wellbutrin XL 300mg  daily 3. Amitriptyline 50mg   at hs - one tablet at bedtime. 4. Ritalin 20mg  at lunch 5. Concerta 36mg  every morning.  Monitor BP between visits while taking stimulant medication.   RTC 4 weeks  Discussed potential benefits, risks, and side effects of stimulants with patient to include increased heart rate, palpitations, insomnia, increased anxiety, increased irritability, or decreased appetite. Also discussed history of paranoia and to watch for any change in moods. Will also discuss changes with parents. Instructed patient to contact office  if experiencing any significant tolerability issues.   Patient advised to contact office with any questions, adverse effects, or acute worsening in signs and symptoms.  Discussed potential metabolic side effects associated with atypical antipsychotics, as well as potential risk for movement side effects. Advised pt to contact office if movement side effects occur.    Diagnoses and all orders for this visit:  Major depressive disorder, recurrent episode, moderate (HCC)  ADHD, predominantly inattentive type -     methylphenidate (CONCERTA) 36 MG PO CR tablet; Take 1 tablet (36 mg total) by mouth daily. -     methylphenidate (RITALIN) 20 MG tablet; Take 1 tablet (20 mg total) by mouth daily in the afternoon.  Insomnia, unspecified type  Paranoia ([place])  GAD (generalized anxiety disorder)     Please see After Visit Summary for patient specific instructions.  Future Appointments  Date Time Provider [place]  07/31/2022 11:00 AM Anson [place], [facility] CP-CP None    No orders of the defined types were placed in this encounter.   -------------------------------

## 2022-07-31 ENCOUNTER — Ambulatory Visit (INDEPENDENT_AMBULATORY_CARE_PROVIDER_SITE_OTHER): Payer: 59 | Admitting: Mental Health

## 2022-07-31 DIAGNOSIS — F900 Attention-deficit hyperactivity disorder, predominantly inattentive type: Secondary | ICD-10-CM

## 2022-07-31 DIAGNOSIS — F331 Major depressive disorder, recurrent, moderate: Secondary | ICD-10-CM

## 2022-07-31 DIAGNOSIS — R69 Illness, unspecified: Secondary | ICD-10-CM | POA: Diagnosis not present

## 2022-07-31 NOTE — Progress Notes (Signed)
[facility] Note  Name: Martel Galvan. Date: 07/31/22 MRN: [id number]   DOB: 1986/02/09 PCP: Patient, No Pcp Per  Time spent: 54 minutes  Treatment:  Individual therapy  Mental Status Exam:    Appearance:   Casual     Behavior:  Appropriate  Motor:  Normal  Speech/Language:   Clear and Coherent  Affect:  full range  Mood:  Euthymic, pleasant  Thought process:  normal  Thought content:    WNL  Sensory/Perceptual disturbances:    none  Orientation:  x4  Attention:  Good  Concentration:  Good  Memory:  WNL  Fund of knowledge:   Good  Insight:    Good  Judgment:   Good  Impulse Control:  Good   Reported Symptoms:  Sleep problems-wakes in the middle of the night, low motivation, isolative, intermittent depressed mood, anxiety, struggles with daily hygiene (not showering, brushing teeth etc), self depreciating thoughts  Risk Assessment: Danger to Self:  No Self-injurious Behavior: No Danger to Others: No Duty to Warn:no Physical Aggression / Violence:No  Access to Firearms a concern: No  Gang Involvement:No  Patient / guardian was educated about steps to take if suicide or homicide risk level increases between visits: yes While future psychiatric events cannot be accurately predicted, the patient does not currently require acute inpatient psychiatric care and does not currently meet [facility] involuntary commitment criteria.    Medications: Current Outpatient Medications  Medication Sig Dispense Refill   amitriptyline (ELAVIL) 50 MG tablet Take 1 tablet (50 mg total) by mouth at bedtime. 30 tablet 2   Ascorbic Acid (VITAMIN C) 1000 MG tablet Take 2,000 mg by mouth daily.     buPROPion (WELLBUTRIN XL) 300 MG 24 hr tablet Take 1 tablet (300 mg total) by mouth daily. 30 tablet 2   cholecalciferol (VITAMIN D3) 25 MCG (1000 UNIT) tablet Take 2,000 Units by mouth daily.     loratadine (CLARITIN) 10 MG tablet Take 1 tablet (10 mg total) by mouth daily. 30  tablet 0   methylphenidate (CONCERTA) 36 MG PO CR tablet Take 1 tablet (36 mg total) by mouth daily. 30 tablet 0   methylphenidate (RITALIN) 20 MG tablet Take 1 tablet (20 mg total) by mouth daily in the afternoon. 30 tablet 0   Multiple Vitamin (MULTIVITAMIN) capsule Take 1 capsule by mouth daily.     omega-3 acid ethyl esters (LOVAZA) 1 g capsule Take 1 capsule (1 g total) by mouth 2 (two) times daily. 60 capsule 0   Omega-3 Fatty Acids (FISH OIL) 1000 MG CAPS Take 1,000 mg by mouth daily.     perphenazine (TRILAFON) 2 MG tablet Take 1 tablet (2 mg) by mouth with a 4 mg tablet twice a day (total of 6 mg bid) 60 tablet 2   perphenazine (TRILAFON) 4 MG tablet Take 1 tablet (4 mg) by mouth with a 2 mg tablet twice a day. (6 mg bid) 60 tablet 2   No current facility-administered medications for this visit.    Subjective: Patient presents for session on time.  Assessed recent events where patient stated that he followed through with reaching out to his vocational rehabilitative case worker due to her not being as consistent with their schedule together.  He went on to share feeling somewhat conflicted as her supervisor has contacted him and he is debating on whether or not to share his concerns as he does not want her to lose her job.  Provided support as he processed  feelings related, refocusing him on identifying needs he is trying to have met for himself through the program and necessary neck steps.  He but stated he plans to reach out to the program for potential financial assistance in his obtaining an information technology degree.  At this point, he identifies how he wants to move forward with the process, finding work at some point possibly part-time while in school.  Explored collaboratively in session ways he feels he is ready at this point for this change and what other aspects of his life might need to change as well such as implementing a more daily structured calendar related to school and  other events.  Assisted him in identifying and reframing some thoughts related to some anxiety that he has about the ongoing changes where he was able to do so in session.  Interventions: CBT, supportive therapy, problem solving strategies  Diagnoses:  No diagnosis found.     Plan: Patient is to use CBT, mindfulness and coping skills to help decrease symptoms associated with their diagnosis.  Patient to look into employment options as well as college courses to eventually obtain more independence.    Long-term goal:   Reduce overall level, frequency, and intensity of the feelings of depression and anxiety 7-8/10 to a 0-2/10 in severity for at least 3 consecutive months.   Short-term goal:  Decrease anxiety producing self talk such as thinking of the worse possible life outcome Decrease ruminating, which can affect his sleep and increased emotional distress Continue to take medications as prescribed Increased self-confidence by engaging in an exercise regimen, working out Increase steps toward independence by finding employment and eventually moving out of this parent's home    Assessment of progress:  progressing  Anson [place], [facility]

## 2022-08-21 ENCOUNTER — Ambulatory Visit (INDEPENDENT_AMBULATORY_CARE_PROVIDER_SITE_OTHER): Payer: 59 | Admitting: Adult Health

## 2022-08-21 ENCOUNTER — Other Ambulatory Visit: Payer: Self-pay | Admitting: Adult Health

## 2022-08-21 ENCOUNTER — Encounter: Payer: Self-pay | Admitting: Adult Health

## 2022-08-21 DIAGNOSIS — F319 Bipolar disorder, unspecified: Secondary | ICD-10-CM

## 2022-08-21 DIAGNOSIS — F22 Delusional disorders: Secondary | ICD-10-CM

## 2022-08-21 DIAGNOSIS — F900 Attention-deficit hyperactivity disorder, predominantly inattentive type: Secondary | ICD-10-CM | POA: Diagnosis not present

## 2022-08-21 DIAGNOSIS — F411 Generalized anxiety disorder: Secondary | ICD-10-CM

## 2022-08-21 DIAGNOSIS — G4700 Insomnia, unspecified: Secondary | ICD-10-CM

## 2022-08-21 DIAGNOSIS — R69 Illness, unspecified: Secondary | ICD-10-CM | POA: Diagnosis not present

## 2022-08-21 DIAGNOSIS — F331 Major depressive disorder, recurrent, moderate: Secondary | ICD-10-CM

## 2022-08-21 MED ORDER — PERPHENAZINE 4 MG PO TABS: ORAL_TABLET | ORAL | 5 refills | Status: DC

## 2022-08-21 MED ORDER — AMITRIPTYLINE HCL 50 MG PO TABS: 50.0000 mg | ORAL_TABLET | Freq: Every day | ORAL | 2 refills | Status: DC

## 2022-08-21 MED ORDER — METHYLPHENIDATE HCL ER (OSM) 36 MG PO TBCR: 36.0000 mg | EXTENDED_RELEASE_TABLET | Freq: Every day | ORAL | 0 refills | Status: DC

## 2022-08-21 MED ORDER — BUPROPION HCL ER (XL) 300 MG PO TB24
300.0000 mg | ORAL_TABLET | Freq: Every day | ORAL | 5 refills | Status: DC
Start: 2022-08-21 — End: 2022-10-29

## 2022-08-21 MED ORDER — PERPHENAZINE 2 MG PO TABS: ORAL_TABLET | ORAL | 5 refills | Status: DC

## 2022-08-21 NOTE — Progress Notes (Signed)
Quamain Czarnecki [id number] 08/13/1986 36 y.o.  Subjective:   Patient ID:  Adam Plowden. is a 36 y.o. (DOB 11-Mar-1986) male.  Chief Complaint: No chief complaint on file.   HPI Adam Gnagey. presents to the office today for follow-up of MDD, GAD, insomnia, paranoia, and ADHD.  Describes mood today as "ok". Pleasant. Denies tearfulness. Mood symptoms - denies depression, anxiety, and irritability. Mood is consistent. Stating "I'm doing alright". Planning to return to school. Feels like medications continue to work.. Seeing Lanetta Inch for therapy. Improved interest and motivation.Taking medications as prescribed.  Energy levels stable. Active, does not have a regular exercise routine.  Enjoys some usual interests and activities. Single. Lives with parents. Spending time with family.  Appetite adequate. Weight gain 230 to 240 pounds. Sleeps well most nights. Averages 7 to 8 hours a night. Focus and concentration stable. Diagnosed with ADHD in childhood. Completing tasks around the house. Managing aspects of household.  Denies SI or HI.   Denies AH or VH.  Denies paranoia. Denies self harm. Denies substance use.   Previous medication: Vick Frees   [id number]    Flowsheet Row ED from 05/16/2020 in [place]  PHQ-2 Total Score 6  PHQ-9 Total Score 17      Flowsheet Row ED from 04/19/2020 in [place] CATEGORY Error: Q2 is Yes, you must answer 3, 4, and 5        Review of Systems:  Review of Systems  Musculoskeletal:  Negative for gait problem.  Neurological:  Negative for tremors.  Psychiatric/Behavioral:         Please refer to HPI    Medications: I have reviewed the patient's current medications.  Current Outpatient Medications  Medication Sig Dispense Refill   amitriptyline (ELAVIL) 50 MG tablet Take 1 tablet (50 mg total) by mouth at bedtime. 30 tablet 2    Ascorbic Acid (VITAMIN C) 1000 MG tablet Take 2,000 mg by mouth daily.     buPROPion (WELLBUTRIN XL) 300 MG 24 hr tablet Take 1 tablet (300 mg total) by mouth daily. 30 tablet 5   cholecalciferol (VITAMIN D3) 25 MCG (1000 UNIT) tablet Take 2,000 Units by mouth daily.     loratadine (CLARITIN) 10 MG tablet Take 1 tablet (10 mg total) by mouth daily. 30 tablet 0   methylphenidate (CONCERTA) 36 MG PO CR tablet Take 1 tablet (36 mg total) by mouth daily. 30 tablet 0   methylphenidate (RITALIN) 20 MG tablet Take 1 tablet (20 mg total) by mouth daily in the afternoon. 30 tablet 0   Multiple Vitamin (MULTIVITAMIN) capsule Take 1 capsule by mouth daily.     omega-3 acid ethyl esters (LOVAZA) 1 g capsule Take 1 capsule (1 g total) by mouth 2 (two) times daily. 60 capsule 0   Omega-3 Fatty Acids (FISH OIL) 1000 MG CAPS Take 1,000 mg by mouth daily.     perphenazine (TRILAFON) 2 MG tablet Take 1 tablet (2 mg) by mouth with a 4 mg tablet twice a day (total of 6 mg bid) 60 tablet 5   perphenazine (TRILAFON) 4 MG tablet Take 1 tablet (4 mg) by mouth with a 2 mg tablet twice a day. (6 mg bid) 60 tablet 5   No current facility-administered medications for this visit.    Medication Side Effects: None  Allergies: No Known Allergies  Past Medical History:  Diagnosis Date   Anxiety  Bipolar 1 disorder ([place])    Depression    Mania ([place])    Suicidal ideations     Past Medical History, Surgical history, Social history, and Family history were reviewed and updated as appropriate.   Please see review of systems for further details on the patient's review from today.   Objective:   Physical Exam:  There were no vitals taken for this visit.  Physical Exam Constitutional:      General: He is not in acute distress. Musculoskeletal:        General: No deformity.  Neurological:     Mental Status: He is alert and oriented to person, place, and time.     Coordination: Coordination normal.  Psychiatric:         Attention and Perception: Attention and perception normal. He does not perceive auditory or visual hallucinations.        Mood and Affect: Mood normal. Mood is not anxious or depressed. Affect is not labile, blunt, angry or inappropriate.        Speech: Speech normal.        Behavior: Behavior normal.        Thought Content: Thought content normal. Thought content is not paranoid or delusional. Thought content does not include homicidal or suicidal ideation. Thought content does not include homicidal or suicidal plan.        Cognition and Memory: Cognition and memory normal.        Judgment: Judgment normal.     Comments: Insight intact     Lab Review:     Component Value Date/Time   NA 140 05/16/2020 0912   K 3.6 05/16/2020 0912   CL 105 05/16/2020 0912   CO2 24 05/16/2020 0912   GLUCOSE 202 (H) 05/16/2020 0912   BUN 11 05/16/2020 0912   CREATININE 1.05 05/16/2020 0912   CALCIUM 9.6 05/16/2020 0912   PROT 7.5 05/16/2020 0912   ALBUMIN 4.3 05/16/2020 0912   AST 20 05/16/2020 0912   ALT 27 05/16/2020 0912   ALKPHOS 73 05/16/2020 0912   BILITOT 0.7 05/16/2020 0912   GFRNONAA >60 05/16/2020 0912   GFRAA >60 05/16/2020 0912       Component Value Date/Time   WBC 12.5 (H) 05/16/2020 0912   RBC 5.12 05/16/2020 0912   HGB 15.3 05/16/2020 0912   HCT 45.6 05/16/2020 0912   PLT 392 05/16/2020 0912   MCV 89.1 05/16/2020 0912   MCH 29.9 05/16/2020 0912   MCHC 33.6 05/16/2020 0912   RDW 12.7 05/16/2020 0912   LYMPHSABS 2.4 04/19/2020 1252   MONOABS 0.8 04/19/2020 1252   EOSABS 0.0 04/19/2020 1252   BASOSABS 0.1 04/19/2020 1252    No results found for: "POCLITH", "LITHIUM"   No results found for: "PHENYTOIN", "PHENOBARB", "VALPROATE", "CBMZ"   .res Assessment: Plan:    Plan:  1. Perphenazine 6mg  in am and 6mg  at bedtime   2. Wellbutrin XL 300mg  daily 3. Amitriptyline 50mg  at hs - one tablet at bedtime. 4. Ritalin 20mg  at lunch 5. Concerta 36mg  every  morning.  Monitor BP between visits while taking stimulant medication.   128/81/89  RTC 4 weeks  Discussed potential benefits, risks, and side effects of stimulants with patient to include increased heart rate, palpitations, insomnia, increased anxiety, increased irritability, or decreased appetite. Also discussed history of paranoia and to watch for any change in moods. Will also discuss changes with parents. Instructed patient to contact office if experiencing any significant tolerability issues.   Patient  advised to contact office with any questions, adverse effects, or acute worsening in signs and symptoms.  Discussed potential metabolic side effects associated with atypical antipsychotics, as well as potential risk for movement side effects. Advised pt to contact office if movement side effects occur.   Diagnoses and all orders for this visit:  Major depressive disorder, recurrent episode, moderate (HCC) -     buPROPion (WELLBUTRIN XL) 300 MG 24 hr tablet; Take 1 tablet (300 mg total) by mouth daily.  ADHD, predominantly inattentive type -     Discontinue: methylphenidate (CONCERTA) 36 MG PO CR tablet; Take 1 tablet (36 mg total) by mouth daily. -     methylphenidate (CONCERTA) 36 MG PO CR tablet; Take 1 tablet (36 mg total) by mouth daily.  Insomnia, unspecified type -     amitriptyline (ELAVIL) 50 MG tablet; Take 1 tablet (50 mg total) by mouth at bedtime.  Paranoia ([place])  GAD (generalized anxiety disorder)  Bipolar I disorder (HCC) -     perphenazine (TRILAFON) 4 MG tablet; Take 1 tablet (4 mg) by mouth with a 2 mg tablet twice a day. (6 mg bid) -     perphenazine (TRILAFON) 2 MG tablet; Take 1 tablet (2 mg) by mouth with a 4 mg tablet twice a day (total of 6 mg bid)     Please see After Visit Summary for patient specific instructions.  Future Appointments  Date Time Provider [place]  08/22/2022 10:00 AM [place]son [place], [facility] CP-CP None    No orders of  the defined types were placed in this encounter.   -------------------------------

## 2022-08-22 ENCOUNTER — Ambulatory Visit (INDEPENDENT_AMBULATORY_CARE_PROVIDER_SITE_OTHER): Payer: 59 | Admitting: Mental Health

## 2022-08-22 DIAGNOSIS — F331 Major depressive disorder, recurrent, moderate: Secondary | ICD-10-CM

## 2022-08-22 DIAGNOSIS — R69 Illness, unspecified: Secondary | ICD-10-CM | POA: Diagnosis not present

## 2022-08-22 DIAGNOSIS — F22 Delusional disorders: Secondary | ICD-10-CM

## 2022-08-22 NOTE — Progress Notes (Signed)
[facility]  Name: Adam Larsen. Date: 08/22/22 MRN: [id number]   DOB: 1986-06-05 PCP: Patient, No Pcp Per  Time spent: 55 minutes  Treatment:  Individual therapy  Mental Status Exam:    Appearance:   Casual     Behavior:  Appropriate  Motor:  Normal  Speech/Language:   Clear and Coherent  Affect:  full range  Mood:  Depressed  Thought process:  normal  Thought content:    WNL  Sensory/Perceptual disturbances:    none  Orientation:  x4  Attention:  Good  Concentration:  Good  Memory:  WNL  Fund of knowledge:   Good  Insight:    Good  Judgment:   Good  Impulse Control:  Good   Reported Symptoms:  Sleep problems-wakes in the middle of the night, low motivation, isolative, intermittent depressed mood, anxiety, struggles with daily hygiene (not showering, brushing teeth etc), self depreciating thoughts  Risk Assessment: Danger to Self:  No Self-injurious Behavior: No Danger to Others: No Duty to Warn:no Physical Aggression / Violence:No  Access to Firearms a concern: No  Gang Involvement:No  Patient / guardian was educated about steps to take if suicide or homicide risk level increases between visits: yes While future psychiatric events cannot be accurately predicted, the patient does not currently require acute inpatient psychiatric care and does not currently meet [facility] involuntary commitment criteria.    Medications: Current Outpatient Medications  Medication Sig Dispense Refill   amitriptyline (ELAVIL) 50 MG tablet Take 1 tablet (50 mg total) by mouth at bedtime. 30 tablet 2   Ascorbic Acid (VITAMIN C) 1000 MG tablet Take 2,000 mg by mouth daily.     buPROPion (WELLBUTRIN XL) 300 MG 24 hr tablet Take 1 tablet (300 mg total) by mouth daily. 30 tablet 5   cholecalciferol (VITAMIN D3) 25 MCG (1000 UNIT) tablet Take 2,000 Units by mouth daily.     loratadine (CLARITIN) 10 MG tablet Take 1 tablet (10 mg total) by mouth daily. 30 tablet 0    methylphenidate (CONCERTA) 36 MG PO CR tablet Take 1 tablet (36 mg total) by mouth daily. 30 tablet 0   methylphenidate (RITALIN) 20 MG tablet Take 1 tablet (20 mg total) by mouth daily in the afternoon. 30 tablet 0   Multiple Vitamin (MULTIVITAMIN) capsule Take 1 capsule by mouth daily.     omega-3 acid ethyl esters (LOVAZA) 1 g capsule Take 1 capsule (1 g total) by mouth 2 (two) times daily. 60 capsule 0   Omega-3 Fatty Acids (FISH OIL) 1000 MG CAPS Take 1,000 mg by mouth daily.     perphenazine (TRILAFON) 2 MG tablet Take 1 tablet (2 mg) by mouth with a 4 mg tablet twice a day (total of 6 mg bid) 60 tablet 5   perphenazine (TRILAFON) 4 MG tablet Take 1 tablet (4 mg) by mouth with a 2 mg tablet twice a day. (6 mg bid) 60 tablet 5   No current facility-administered medications for this visit.    Subjective: Patient presents for session on time.  Assessed progress.  Patient stated that he has been feeling more paranoid recently, going on to give some examples.  He stated he discontinued his daytime antipsychotic medication approximately 2 weeks ago, began to recently have more experiences with paranoia, feels that others sometimes "speak in code".  Through extensive further discussion, he acknowledges the impact of not taking his medication and he started back yesterday.  He stated he felt less paranoid yesterday  as a result and today.  He expressed recognizing these changes and attributed it to his decision to stop his medication plans to take it consistently going forward.  Facilitated his identifying and sharing some examples of his experiences and facilitated patient toward increased insight through his reality testing.  Patient was able to identify and articulate thoughts and feelings related to his recent increase in paranoia as a result of his history of symptoms, continuing to reaffirm his commitment to take his medications as prescribed.  Provide support, continue to work with patient from a  strengths based framework, where he was able to identify his plans to go forward with attending school, communicating with his case worker through vocational rehabilitative services.  Interventions: CBT, supportive therapy, problem solving strategies  Diagnoses:    ICD-10-CM   1. Paranoia ([place])  F22     2. Major depressive disorder, recurrent episode, moderate ([place])  F33.1          Plan: Patient is to use CBT, mindfulness and coping skills to help decrease symptoms associated with their diagnosis.  Patient to look into employment options as well as college courses to eventually obtain more independence.    Long-term goal:   Reduce overall level, frequency, and intensity of the feelings of depression and anxiety 7-8/10 to a 0-2/10 in severity for at least 3 consecutive months.   Short-term goal:  Decrease anxiety producing self talk such as thinking of the worse possible life outcome Decrease ruminating, which can affect his sleep and increased emotional distress Continue to take medications as prescribed Increased self-confidence by engaging in an exercise regimen, working out Increase steps toward independence by finding employment and eventually moving out of this parent's home    Assessment of progress:  progressing  Anson [place], [facility]

## 2022-08-23 ENCOUNTER — Other Ambulatory Visit: Payer: Self-pay | Admitting: Adult Health

## 2022-08-23 DIAGNOSIS — F900 Attention-deficit hyperactivity disorder, predominantly inattentive type: Secondary | ICD-10-CM

## 2022-08-23 NOTE — Telephone Encounter (Signed)
Liliane Channel called today at 3:23.  He was just seen 11/1 and his prescription for Ritalin was not sent in.   Next appt 11/29.  Send to [employer].

## 2022-08-27 MED ORDER — METHYLPHENIDATE HCL 20 MG PO TABS: 20.0000 mg | ORAL_TABLET | Freq: Every day | ORAL | 0 refills | Status: DC

## 2022-08-28 ENCOUNTER — Ambulatory Visit (INDEPENDENT_AMBULATORY_CARE_PROVIDER_SITE_OTHER): Payer: 59 | Admitting: Mental Health

## 2022-08-28 DIAGNOSIS — R69 Illness, unspecified: Secondary | ICD-10-CM | POA: Diagnosis not present

## 2022-08-28 DIAGNOSIS — F900 Attention-deficit hyperactivity disorder, predominantly inattentive type: Secondary | ICD-10-CM

## 2022-08-28 NOTE — Progress Notes (Signed)
[facility]  Name: Adam Larsen. Date: 08/28/22 MRN: [id number]   DOB: November 06, 1985 PCP: Patient, No Pcp Per  Time spent: 54 minutes  Treatment:  Individual therapy  Mental Status Exam:    Appearance:   Casual     Behavior:  Appropriate  Motor:  Normal  Speech/Language:   Clear and Coherent  Affect:  full range  Mood:  Anxious, pleasant  Thought process:  normal  Thought content:    WNL  Sensory/Perceptual disturbances:    none  Orientation:  x4  Attention:  Good  Concentration:  Good  Memory:  WNL  Fund of knowledge:   Good  Insight:    Good  Judgment:   Good  Impulse Control:  Good   Reported Symptoms:  Sleep problems-wakes in the middle of the night, low motivation, isolative, intermittent depressed mood, anxiety, struggles with daily hygiene (not showering, brushing teeth etc), self depreciating thoughts  Risk Assessment: Danger to Self:  No Self-injurious Behavior: No Danger to Others: No Duty to Warn:no Physical Aggression / Violence:No  Access to Firearms a concern: No  Gang Involvement:No  Patient / guardian was educated about steps to take if suicide or homicide risk level increases between visits: yes While future psychiatric events cannot be accurately predicted, the patient does not currently require acute inpatient psychiatric care and does not currently meet [facility] involuntary commitment criteria.    Medications: Current Outpatient Medications  Medication Sig Dispense Refill   amitriptyline (ELAVIL) 50 MG tablet Take 1 tablet (50 mg total) by mouth at bedtime. 30 tablet 2   Ascorbic Acid (VITAMIN C) 1000 MG tablet Take 2,000 mg by mouth daily.     buPROPion (WELLBUTRIN XL) 300 MG 24 hr tablet Take 1 tablet (300 mg total) by mouth daily. 30 tablet 5   cholecalciferol (VITAMIN D3) 25 MCG (1000 UNIT) tablet Take 2,000 Units by mouth daily.     loratadine (CLARITIN) 10 MG tablet Take 1 tablet (10 mg total) by mouth daily. 30  tablet 0   methylphenidate (CONCERTA) 36 MG PO CR tablet Take 1 tablet (36 mg total) by mouth daily. 30 tablet 0   methylphenidate (RITALIN) 20 MG tablet Take 1 tablet (20 mg total) by mouth daily in the afternoon. 30 tablet 0   Multiple Vitamin (MULTIVITAMIN) capsule Take 1 capsule by mouth daily.     omega-3 acid ethyl esters (LOVAZA) 1 g capsule Take 1 capsule (1 g total) by mouth 2 (two) times daily. 60 capsule 0   Omega-3 Fatty Acids (FISH OIL) 1000 MG CAPS Take 1,000 mg by mouth daily.     perphenazine (TRILAFON) 2 MG tablet Take 1 tablet (2 mg) by mouth with a 4 mg tablet twice a day (total of 6 mg bid) 60 tablet 5   perphenazine (TRILAFON) 4 MG tablet Take 1 tablet (4 mg) by mouth with a 2 mg tablet twice a day. (6 mg bid) 60 tablet 5   No current facility-administered medications for this visit.    Subjective: Patient presents for session on time.  Assessed progress.  Patient stated he continues to be back on his medication consistently.  Reports no paranoia.  He shared thoughts and feelings related to the effectiveness of his medication with his commitment to continue to take it consistently going forward. Continue to work with patient from a strengths based framework, where he was able to identify his plans to go forward with attending school, communicating with his case worker through vocational  rehabilitative services.  He stated that at this point, he knows that his tuition will be paid for by the vocational rehabilitative program.  Some anxiety related to his attending school in the winter semester as he has not been in school for many years.  Facilitated his identifying self supportive thoughts where he was able to share in session how he feels he is older and more mature to handle school compared to in the past, and is more focused and motivated to be successful in school currently.  Interventions: CBT, supportive therapy, problem solving strategies  Diagnoses:    ICD-10-CM   1.  ADHD, predominantly inattentive type  F90.0           Plan: Patient is to use CBT, mindfulness and coping skills to help decrease symptoms associated with their diagnosis.  Patient to look into employment options as well as college courses to eventually obtain more independence.    Long-term goal:   Reduce overall level, frequency, and intensity of the feelings of depression and anxiety 7-8/10 to a 0-2/10 in severity for at least 3 consecutive months.   Short-term goal:  Decrease anxiety producing self talk such as thinking of the worse possible life outcome Decrease ruminating, which can affect his sleep and increased emotional distress Continue to take medications as prescribed Increased self-confidence by engaging in an exercise regimen, working out Increase steps toward independence by finding employment and eventually moving out of this parent's home    Assessment of progress:  progressing  Anson [place], [facility]

## 2022-09-18 ENCOUNTER — Encounter: Payer: Self-pay | Admitting: Adult Health

## 2022-09-18 ENCOUNTER — Ambulatory Visit (INDEPENDENT_AMBULATORY_CARE_PROVIDER_SITE_OTHER): Payer: 59 | Admitting: Adult Health

## 2022-09-18 DIAGNOSIS — F331 Major depressive disorder, recurrent, moderate: Secondary | ICD-10-CM | POA: Diagnosis not present

## 2022-09-18 DIAGNOSIS — G4700 Insomnia, unspecified: Secondary | ICD-10-CM | POA: Diagnosis not present

## 2022-09-18 DIAGNOSIS — F900 Attention-deficit hyperactivity disorder, predominantly inattentive type: Secondary | ICD-10-CM | POA: Diagnosis not present

## 2022-09-18 DIAGNOSIS — F411 Generalized anxiety disorder: Secondary | ICD-10-CM

## 2022-09-18 DIAGNOSIS — F22 Delusional disorders: Secondary | ICD-10-CM | POA: Diagnosis not present

## 2022-09-18 DIAGNOSIS — R69 Illness, unspecified: Secondary | ICD-10-CM | POA: Diagnosis not present

## 2022-09-18 MED ORDER — METHYLPHENIDATE HCL ER (OSM) 36 MG PO TBCR: 36.0000 mg | EXTENDED_RELEASE_TABLET | Freq: Every day | ORAL | 0 refills | Status: DC

## 2022-09-18 MED ORDER — METHYLPHENIDATE HCL 20 MG PO TABS: 20.0000 mg | ORAL_TABLET | Freq: Every day | ORAL | 0 refills | Status: DC

## 2022-09-18 NOTE — Progress Notes (Signed)
Adam Larsen [id number] 05-27-1986 36 y.o.  Subjective:   Patient ID:  Adam Larsen. is a 36 y.o. (DOB 11-Apr-1986) male.  Chief Complaint: No chief complaint on file.   HPI Elliot Meldrum. presents to the office today for follow-up of MDD, GAD, insomnia, paranoia, and ADHD.  Describes mood today as "ok". Pleasant. Denies tearfulness. Mood symptoms - denies depression, anxiety, and irritability. Mood is consistent. Stating "I feel like I'm in a good place". Feels like medications continue to work well. Planning to return to school in January. Seeing [name] for therapy. Improved interest and motivation.Taking medications as prescribed.  Energy levels stable. Active, does not have a regular exercise routine. Walking. Enjoys some usual interests and activities. Single. Lives with parents. Spending time with family.  Appetite adequate. Weight stable 240 pounds. Sleeps well most nights. Averages 7 to 8 hours a night. Focus and concentration stable. Diagnosed with ADHD in childhood. Completing tasks around the house. Managing aspects of household.  Denies SI or HI.   Denies AH or VH.  Denies paranoia. Denies self harm. Denies substance use.  Previous medication: [name]   WCH8-5    Flowsheet Row ED from 05/16/2020 in [facility] EMERGENCY DEPARTMENT  PHQ-2 Total Score 6  PHQ-9 Total Score 17      Flowsheet Row ED from 04/19/2020 in [facility] EMERGENCY DEPARTMENT  C-SSRS RISK CATEGORY Error: Q2 is Yes, you must answer 3, 4, and 5        Review of Systems:  Review of Systems  Musculoskeletal:  Negative for gait problem.  Neurological:  Negative for tremors.  Psychiatric/Behavioral:         Please refer to HPI    Medications: I have reviewed the patient's current medications.  Current Outpatient Medications  Medication Sig Dispense Refill   amitriptyline (ELAVIL) 50 MG tablet Take 1 tablet (50 mg total) by  mouth at bedtime. 30 tablet 2   Ascorbic Acid (VITAMIN C) 1000 MG tablet Take 2,000 mg by mouth daily.     buPROPion (WELLBUTRIN XL) 300 MG 24 hr tablet Take 1 tablet (300 mg total) by mouth daily. 30 tablet 5   cholecalciferol (VITAMIN D3) 25 MCG (1000 UNIT) tablet Take 2,000 Units by mouth daily.     loratadine (CLARITIN) 10 MG tablet Take 1 tablet (10 mg total) by mouth daily. 30 tablet 0   methylphenidate (CONCERTA) 36 MG PO CR tablet Take 1 tablet (36 mg total) by mouth daily. 30 tablet 0   methylphenidate (RITALIN) 20 MG tablet Take 1 tablet (20 mg total) by mouth daily in the afternoon. 30 tablet 0   Multiple Vitamin (MULTIVITAMIN) capsule Take 1 capsule by mouth daily.     omega-3 acid ethyl esters (LOVAZA) 1 g capsule Take 1 capsule (1 g total) by mouth 2 (two) times daily. 60 capsule 0   Omega-3 Fatty Acids (FISH OIL) 1000 MG CAPS Take 1,000 mg by mouth daily.     perphenazine (TRILAFON) 2 MG tablet Take 1 tablet (2 mg) by mouth with a 4 mg tablet twice a day (total of 6 mg bid) 60 tablet 5   perphenazine (TRILAFON) 4 MG tablet Take 1 tablet (4 mg) by mouth with a 2 mg tablet twice a day. (6 mg bid) 60 tablet 5   No current facility-administered medications for this visit.    Medication Side Effects: None  Allergies: No Known Allergies  Past Medical History:  Diagnosis Date  Anxiety    Bipolar 1 disorder (HCC)    Depression    Mania (HCC)    Suicidal ideations     Past Medical History, Surgical history, Social history, and Family history were reviewed and updated as appropriate.   Please see review of systems for further details on the patient's review from today.   Objective:   Physical Exam:  There were no vitals taken for this visit.  Physical Exam  Lab Review:     Component Value Date/Time   NA 140 05/16/2020 0912   K 3.6 05/16/2020 0912   CL 105 05/16/2020 0912   CO2 24 05/16/2020 0912   GLUCOSE 202 (H) 05/16/2020 0912   BUN 11 05/16/2020 0912    CREATININE 1.05 05/16/2020 0912   CALCIUM 9.6 05/16/2020 0912   PROT 7.5 05/16/2020 0912   ALBUMIN 4.3 05/16/2020 0912   AST 20 05/16/2020 0912   ALT 27 05/16/2020 0912   ALKPHOS 73 05/16/2020 0912   BILITOT 0.7 05/16/2020 0912   GFRNONAA >60 05/16/2020 0912   GFRAA >60 05/16/2020 0912       Component Value Date/Time   WBC 12.5 (H) 05/16/2020 0912   RBC 5.12 05/16/2020 0912   HGB 15.3 05/16/2020 0912   HCT 45.6 05/16/2020 0912   PLT 392 05/16/2020 0912   MCV 89.1 05/16/2020 0912   MCH 29.9 05/16/2020 0912   MCHC 33.6 05/16/2020 0912   RDW 12.7 05/16/2020 0912   LYMPHSABS 2.4 04/19/2020 1252   MONOABS 0.8 04/19/2020 1252   EOSABS 0.0 04/19/2020 1252   BASOSABS 0.1 04/19/2020 1252    No results found for: "POCLITH", "LITHIUM"   No results found for: "PHENYTOIN", "PHENOBARB", "VALPROATE", "CBMZ"   .res Assessment: Plan:    Plan:  1. Perphenazine 6mg  in am and 6mg  at bedtime   2. Wellbutrin XL 300mg  daily 3. Amitriptyline 50mg  at hs - one tablet at bedtime. 4. Ritalin 20mg  at lunch 5. Concerta 36mg  every morning.  Monitor BP between visits while taking stimulant medication.   RTC 4 weeks  Discussed potential benefits, risks, and side effects of stimulants with patient to include increased heart rate, palpitations, insomnia, increased anxiety, increased irritability, or decreased appetite. Also discussed history of paranoia and to watch for any change in moods. Will also discuss changes with parents. Instructed patient to contact office if experiencing any significant tolerability issues.   Patient advised to contact office with any questions, adverse effects, or acute worsening in signs and symptoms.  Discussed potential metabolic side effects associated with atypical antipsychotics, as well as potential risk for movement side effects. Advised pt to contact office if movement side effects occur.   Diagnoses and all orders for this visit:  Major depressive disorder,  recurrent episode, moderate (HCC)  ADHD, predominantly inattentive type -     methylphenidate (RITALIN) 20 MG tablet; Take 1 tablet (20 mg total) by mouth daily in the afternoon. -     methylphenidate (CONCERTA) 36 MG PO CR tablet; Take 1 tablet (36 mg total) by mouth daily.  GAD (generalized anxiety disorder)  Insomnia, unspecified type  Paranoia (HCC)     Please see After Visit Summary for patient specific instructions.  Future Appointments  Date Time Provider Department Center  09/27/2022 11:00 AM , [facility] CP-CP None  10/18/2022 10:20 AM Brandan Robicheaux, , NP CP-CP None    No orders of the defined types were placed in this encounter.   -------------------------------

## 2022-09-27 ENCOUNTER — Ambulatory Visit: Payer: 59 | Admitting: Mental Health

## 2022-10-17 ENCOUNTER — Telehealth: Payer: Self-pay | Admitting: Adult Health

## 2022-10-17 NOTE — Telephone Encounter (Signed)
09/24/2022 09/18/2022 1  Methylphenidate 20 Mg Tablet 30.00 30 Re Moz [id number] Cos (4291) 0/0   09/18/2022 09/18/2022 1  Methylphenidate Er 36 Mg Tab 30.00 30 Re Moz [id number] Cos (4291) 0/0

## 2022-10-17 NOTE — Telephone Encounter (Signed)
Adam Larsen LM at 10:27 to request refill of his Ritalin and Concerta to pick up next week.Marland Kitchen  Appt 10/29/22. H e did not specify pharmacy

## 2022-10-18 ENCOUNTER — Other Ambulatory Visit: Payer: Self-pay

## 2022-10-18 ENCOUNTER — Ambulatory Visit: Payer: 59 | Admitting: Adult Health

## 2022-10-18 DIAGNOSIS — F900 Attention-deficit hyperactivity disorder, predominantly inattentive type: Secondary | ICD-10-CM

## 2022-10-18 MED ORDER — METHYLPHENIDATE HCL ER (OSM) 36 MG PO TBCR: 36.0000 mg | EXTENDED_RELEASE_TABLET | Freq: Every day | ORAL | 0 refills | Status: DC

## 2022-10-18 NOTE — Telephone Encounter (Signed)
LVM to RC in regards to pharmacy.

## 2022-10-18 NOTE — Telephone Encounter (Signed)
Pended 36 mg methylphenidate, 20 mg due 1/2, postponed.

## 2022-10-22 ENCOUNTER — Other Ambulatory Visit: Payer: Self-pay

## 2022-10-22 DIAGNOSIS — F900 Attention-deficit hyperactivity disorder, predominantly inattentive type: Secondary | ICD-10-CM

## 2022-10-22 MED ORDER — METHYLPHENIDATE HCL 20 MG PO TABS: 20.0000 mg | ORAL_TABLET | Freq: Every day | ORAL | 0 refills | Status: DC

## 2022-10-22 NOTE — Telephone Encounter (Signed)
Pended 20 mg

## 2022-10-23 ENCOUNTER — Other Ambulatory Visit: Payer: Self-pay | Admitting: Adult Health

## 2022-10-23 DIAGNOSIS — F900 Attention-deficit hyperactivity disorder, predominantly inattentive type: Secondary | ICD-10-CM

## 2022-10-23 MED ORDER — METHYLPHENIDATE HCL 20 MG PO TABS: 20.0000 mg | ORAL_TABLET | Freq: Every day | ORAL | 0 refills | Status: DC

## 2022-10-23 NOTE — Telephone Encounter (Signed)
Pt called [facility] back order for Ritalin. Please send to [place]. Advise pt when sent [phone]

## 2022-10-23 NOTE — Telephone Encounter (Signed)
Pended.

## 2022-10-29 ENCOUNTER — Encounter: Payer: Self-pay | Admitting: Adult Health

## 2022-10-29 ENCOUNTER — Ambulatory Visit (INDEPENDENT_AMBULATORY_CARE_PROVIDER_SITE_OTHER): Payer: 59 | Admitting: Adult Health

## 2022-10-29 DIAGNOSIS — F319 Bipolar disorder, unspecified: Secondary | ICD-10-CM | POA: Diagnosis not present

## 2022-10-29 DIAGNOSIS — F900 Attention-deficit hyperactivity disorder, predominantly inattentive type: Secondary | ICD-10-CM

## 2022-10-29 DIAGNOSIS — R69 Illness, unspecified: Secondary | ICD-10-CM | POA: Diagnosis not present

## 2022-10-29 DIAGNOSIS — F331 Major depressive disorder, recurrent, moderate: Secondary | ICD-10-CM

## 2022-10-29 MED ORDER — METHYLPHENIDATE HCL ER (OSM) 36 MG PO TBCR: EXTENDED_RELEASE_TABLET | ORAL | 0 refills | Status: DC

## 2022-10-29 MED ORDER — PERPHENAZINE 2 MG PO TABS: ORAL_TABLET | ORAL | 5 refills | Status: DC

## 2022-10-29 MED ORDER — BUPROPION HCL ER (XL) 300 MG PO TB24: 300.0000 mg | ORAL_TABLET | Freq: Every day | ORAL | 5 refills | Status: DC

## 2022-10-29 MED ORDER — PERPHENAZINE 4 MG PO TABS: ORAL_TABLET | ORAL | 5 refills | Status: DC

## 2022-10-29 NOTE — Progress Notes (Signed)
Adam Larsen [id number] 1986-05-13 37 y.o.  Subjective:   Patient ID:  Adam Sanfilippo. is a 37 y.o. (DOB 04-08-1986) male.  Chief Complaint: No chief complaint on file.   HPI Adam Larsen. presents to the office today for follow-up of MDD, GAD, insomnia, paranoia, and ADHD.  Describes mood today as "ok". Pleasant. Denies tearfulness. Mood symptoms - denies depression and anxiety. Reports increased irritability - more situational. He and father have been arguing more. Worried about his dog - "not doing well". Mood is mostly consistent. Stating "I feel like I'm doing alright". Feels like medications are helpful. Planning to attend a boot camp - to help prep him to get a job and get certified. Seeing Lanetta Inch for therapy. Improved interest and motivation. Taking medications as prescribed.  Energy levels stable. Active, does not have a regular exercise routine. Walking most days. Enjoys some usual interests and activities. Single. Lives with parents. Spending time with family.  Appetite adequate. Weight stable 240 to 250 pounds. Sleeps well most nights. Averages 7 to 8 hours a night. Focus and concentration difficulties - wanting to switch to Concerta twice daily and discontinue the Ritalin. Diagnosed with ADHD in childhood. Completing tasks around the house.  Denies SI or HI.   Denies AH or VH.  Denies paranoia. Denies self harm. Denies substance use.  Previous medication: Vick Frees   [id number]    Flowsheet Row ED from 05/16/2020 in [facility]  PHQ-2 Total Score 6  PHQ-9 Total Score 17      Flowsheet Row ED from 04/19/2020 in [facility] CATEGORY Error: Q2 is Yes, you must answer 3, 4, and 5        Review of Systems:  Review of Systems  Musculoskeletal:  Negative for gait problem.  Neurological:  Negative for tremors.  Psychiatric/Behavioral:         Please refer  to HPI    Medications: I have reviewed the patient's current medications.  Current Outpatient Medications  Medication Sig Dispense Refill   amitriptyline (ELAVIL) 50 MG tablet Take 1 tablet (50 mg total) by mouth at bedtime. 30 tablet 2   Ascorbic Acid (VITAMIN C) 1000 MG tablet Take 2,000 mg by mouth daily.     buPROPion (WELLBUTRIN XL) 300 MG 24 hr tablet Take 1 tablet (300 mg total) by mouth daily. 30 tablet 5   cholecalciferol (VITAMIN D3) 25 MCG (1000 UNIT) tablet Take 2,000 Units by mouth daily.     loratadine (CLARITIN) 10 MG tablet Take 1 tablet (10 mg total) by mouth daily. 30 tablet 0   methylphenidate (CONCERTA) 36 MG PO CR tablet Take one tablet in the morning and one tablet at lunch. 60 tablet 0   Multiple Vitamin (MULTIVITAMIN) capsule Take 1 capsule by mouth daily.     omega-3 acid ethyl esters (LOVAZA) 1 g capsule Take 1 capsule (1 g total) by mouth 2 (two) times daily. 60 capsule 0   Omega-3 Fatty Acids (FISH OIL) 1000 MG CAPS Take 1,000 mg by mouth daily.     perphenazine (TRILAFON) 2 MG tablet Take 1 tablet (2 mg) by mouth with a 4 mg tablet twice a day (total of 6 mg bid) 60 tablet 5   perphenazine (TRILAFON) 4 MG tablet Take 1 tablet (4 mg) by mouth with a 2 mg tablet twice a day. (6 mg bid) 60 tablet 5   No current facility-administered medications for this  visit.    Medication Side Effects: None  Allergies: No Known Allergies  Past Medical History:  Diagnosis Date   Anxiety    Bipolar 1 disorder (HCC)    Depression    Mania (HCC)    Suicidal ideations     Past Medical History, Surgical history, Social history, and Family history were reviewed and updated as appropriate.   Please see review of systems for further details on the patient's review from today.   Objective:   Physical Exam:  There were no vitals taken for this visit.  Physical Exam Constitutional:      General: He is not in acute distress. Musculoskeletal:        General: No deformity.   Neurological:     Mental Status: He is alert and oriented to person, place, and time.     Coordination: Coordination normal.  Psychiatric:        Attention and Perception: Attention and perception normal. He does not perceive auditory or visual hallucinations.        Mood and Affect: Mood normal. Mood is not anxious or depressed. Affect is not labile, blunt, angry or inappropriate.        Speech: Speech normal.        Behavior: Behavior normal.        Thought Content: Thought content normal. Thought content is not paranoid or delusional. Thought content does not include homicidal or suicidal ideation. Thought content does not include homicidal or suicidal plan.        Cognition and Memory: Cognition and memory normal.        Judgment: Judgment normal.     Comments: Insight intact     Lab Review:     Component Value Date/Time   NA 140 05/16/2020 0912   K 3.6 05/16/2020 0912   CL 105 05/16/2020 0912   CO2 24 05/16/2020 0912   GLUCOSE 202 (H) 05/16/2020 0912   BUN 11 05/16/2020 0912   CREATININE 1.05 05/16/2020 0912   CALCIUM 9.6 05/16/2020 0912   PROT 7.5 05/16/2020 0912   ALBUMIN 4.3 05/16/2020 0912   AST 20 05/16/2020 0912   ALT 27 05/16/2020 0912   ALKPHOS 73 05/16/2020 0912   BILITOT 0.7 05/16/2020 0912   GFRNONAA >60 05/16/2020 0912   GFRAA >60 05/16/2020 0912       Component Value Date/Time   WBC 12.5 (H) 05/16/2020 0912   RBC 5.12 05/16/2020 0912   HGB 15.3 05/16/2020 0912   HCT 45.6 05/16/2020 0912   PLT 392 05/16/2020 0912   MCV 89.1 05/16/2020 0912   MCH 29.9 05/16/2020 0912   MCHC 33.6 05/16/2020 0912   RDW 12.7 05/16/2020 0912   LYMPHSABS 2.4 04/19/2020 1252   MONOABS 0.8 04/19/2020 1252   EOSABS 0.0 04/19/2020 1252   BASOSABS 0.1 04/19/2020 1252    No results found for: "POCLITH", "LITHIUM"   No results found for: "PHENYTOIN", "PHENOBARB", "VALPROATE", "CBMZ"   .res Assessment: Plan:    Plan:  1. Perphenazine 6mg  in am and 6mg  at bedtime    2. Wellbutrin XL 300mg  daily 3. Amitriptyline 50mg  at hs - one tablet at bedtime. 4. Discontinue Ritalin 20mg  at lunch 5. Increase Concerta 36mg  daily to twice daily.    Monitor BP between visits while taking stimulant medication.   RTC 4 weeks  Discussed potential benefits, risks, and side effects of stimulants with patient to include increased heart rate, palpitations, insomnia, increased anxiety, increased irritability, or decreased appetite. Also discussed history  of paranoia and to watch for any change in moods. Will also discuss changes with parents. Instructed patient to contact office if experiencing any significant tolerability issues.   Patient advised to contact office with any questions, adverse effects, or acute worsening in signs and symptoms.  Discussed potential metabolic side effects associated with atypical antipsychotics, as well as potential risk for movement side effects. Advised pt to contact office if movement side effects occur.   Diagnoses and all orders for this visit:  ADHD, predominantly inattentive type -     methylphenidate (CONCERTA) 36 MG PO CR tablet; Take one tablet in the morning and one tablet at lunch.  Bipolar I disorder (HCC) -     perphenazine (TRILAFON) 4 MG tablet; Take 1 tablet (4 mg) by mouth with a 2 mg tablet twice a day. (6 mg bid) -     perphenazine (TRILAFON) 2 MG tablet; Take 1 tablet (2 mg) by mouth with a 4 mg tablet twice a day (total of 6 mg bid)  Major depressive disorder, recurrent episode, moderate (HCC) -     buPROPion (WELLBUTRIN XL) 300 MG 24 hr tablet; Take 1 tablet (300 mg total) by mouth daily.     Please see After Visit Summary for patient specific instructions.  Future Appointments  Date Time Provider Department Center  11/06/2022 10:00 AM [name] Session, [facility] CP-CP None    No orders of the defined types were placed in this encounter.   -------------------------------

## 2022-11-06 ENCOUNTER — Ambulatory Visit (INDEPENDENT_AMBULATORY_CARE_PROVIDER_SITE_OTHER): Payer: 59 | Admitting: Mental Health

## 2022-11-06 DIAGNOSIS — R69 Illness, unspecified: Secondary | ICD-10-CM | POA: Diagnosis not present

## 2022-11-06 DIAGNOSIS — F900 Attention-deficit hyperactivity disorder, predominantly inattentive type: Secondary | ICD-10-CM | POA: Diagnosis not present

## 2022-11-06 NOTE — Progress Notes (Signed)
[facility] Note  Name: Adam Larsen. Date: 11/06/22 MRN: [id number]   DOB: [birth year] PCP: Patient, No Pcp Per  Time spent: 53 minutes  Treatment:  Individual therapy  Mental Status Exam:    Appearance:   Casual     Behavior:  Appropriate  Motor:  Normal  Speech/Language:   Clear and Coherent  Affect:  full range  Mood:  Anxious, pleasant  Thought process:  normal  Thought content:    WNL  Sensory/Perceptual disturbances:    none  Orientation:  x4  Attention:  Good  Concentration:  Good  Memory:  WNL  Fund of knowledge:   Good  Insight:    Good  Judgment:   Good  Impulse Control:  Good   Reported Symptoms:  Sleep problems-wakes in the middle of the night, low motivation, isolative, intermittent depressed mood, anxiety, struggles with daily hygiene (not showering, brushing teeth etc), self depreciating thoughts  Risk Assessment: Danger to Self:  No Self-injurious Behavior: No Danger to Others: No Duty to Warn:no Physical Aggression / Violence:No  Access to Firearms a concern: No  Gang Involvement:No  Patient / guardian was educated about steps to take if suicide or homicide risk level increases between visits: yes While future psychiatric events cannot be accurately predicted, the patient does not currently require acute inpatient psychiatric care and does not currently meet [facility] involuntary commitment criteria.    Medications: Current Outpatient Medications  Medication Sig Dispense Refill   amitriptyline (ELAVIL) 50 MG tablet Take 1 tablet (50 mg total) by mouth at bedtime. 30 tablet 2   Ascorbic Acid (VITAMIN C) 1000 MG tablet Take 2,000 mg by mouth daily.     buPROPion (WELLBUTRIN XL) 300 MG 24 hr tablet Take 1 tablet (300 mg total) by mouth daily. 30 tablet 5   cholecalciferol (VITAMIN D3) 25 MCG (1000 UNIT) tablet Take 2,000 Units by mouth daily.     loratadine (CLARITIN) 10 MG tablet Take 1 tablet (10 mg total) by mouth daily. 30  tablet 0   methylphenidate (CONCERTA) 36 MG PO CR tablet Take one tablet in the morning and one tablet at lunch. 60 tablet 0   Multiple Vitamin (MULTIVITAMIN) capsule Take 1 capsule by mouth daily.     omega-3 acid ethyl esters (LOVAZA) 1 g capsule Take 1 capsule (1 g total) by mouth 2 (two) times daily. 60 capsule 0   Omega-3 Fatty Acids (FISH OIL) 1000 MG CAPS Take 1,000 mg by mouth daily.     perphenazine (TRILAFON) 2 MG tablet Take 1 tablet (2 mg) by mouth with a 4 mg tablet twice a day (total of 6 mg bid) 60 tablet 5   perphenazine (TRILAFON) 4 MG tablet Take 1 tablet (4 mg) by mouth with a 2 mg tablet twice a day. (6 mg bid) 60 tablet 5   No current facility-administered medications for this visit.    Subjective: Patient presents for session on time.  Assessed progress since last visit which was about 2 to 3 months ago.  Patient shared challenges with family, specifically with his father.  He went on to share details related to a family argument that occurred ultimately leading his father getting patient and his mother as being "silent treatment" for about a week.  Through guided discovery, patient identified how he could have handled the situation differently as he was upset and noticed his father became defensive.  Patient ultimately wants his father to feel like he accountable and communicate more often  which he stated has been a chronic issue and only their relationship with that of the entire family.  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  Patient stated that he is hopeful about the process and ultimately wants to have independence, be able to move out of his parent's home.  Facilitated his identifying self supportive thoughts and encouraged him to identify others between visits to maintain motivation and self belief.   Interventions: CBT, supportive therapy, problem solving  strategies  Diagnoses:    ICD-10-CM   1. ADHD, predominantly inattentive type  F90.0            Plan: Patient is to use CBT, mindfulness and coping skills to help decrease symptoms associated with their diagnosis.  Patient to look into employment options as well as college courses to eventually obtain more independence.    Long-term goal:   Reduce overall level, frequency, and intensity of the feelings of depression and anxiety 7-8/10 to a 0-2/10 in severity for at least 3 consecutive months.   Short-term goal:  Decrease anxiety producing self talk such as thinking of the worse possible life outcome Decrease ruminating, which can affect his sleep and increased emotional distress Continue to take medications as prescribed Increased self-confidence by engaging in an exercise regimen, working out Increase steps toward independence by finding employment and eventually moving out of this parent's home    Assessment of progress:  progressing  [place], [facility]

## 2022-11-26 ENCOUNTER — Ambulatory Visit (INDEPENDENT_AMBULATORY_CARE_PROVIDER_SITE_OTHER): Payer: 59 | Admitting: Adult Health

## 2022-11-26 ENCOUNTER — Encounter: Payer: Self-pay | Admitting: Adult Health

## 2022-11-26 DIAGNOSIS — F22 Delusional disorders: Secondary | ICD-10-CM | POA: Diagnosis not present

## 2022-11-26 DIAGNOSIS — F331 Major depressive disorder, recurrent, moderate: Secondary | ICD-10-CM | POA: Diagnosis not present

## 2022-11-26 DIAGNOSIS — G4700 Insomnia, unspecified: Secondary | ICD-10-CM | POA: Diagnosis not present

## 2022-11-26 DIAGNOSIS — F411 Generalized anxiety disorder: Secondary | ICD-10-CM | POA: Diagnosis not present

## 2022-11-26 DIAGNOSIS — F900 Attention-deficit hyperactivity disorder, predominantly inattentive type: Secondary | ICD-10-CM

## 2022-11-26 DIAGNOSIS — R69 Illness, unspecified: Secondary | ICD-10-CM | POA: Diagnosis not present

## 2022-11-26 MED ORDER — METHYLPHENIDATE HCL ER (OSM) 36 MG PO TBCR: EXTENDED_RELEASE_TABLET | ORAL | 0 refills | Status: DC

## 2022-11-26 MED ORDER — AMITRIPTYLINE HCL 50 MG PO TABS: 50.0000 mg | ORAL_TABLET | Freq: Every day | ORAL | 2 refills | Status: DC

## 2022-11-26 NOTE — Progress Notes (Signed)
Adam Larsen [id number] 1986/06/22 37 y.o.  Subjective:   Patient ID:  Adam Larsen. is a 37 y.o. (DOB [birth year]) male.  Chief Complaint: No chief complaint on file.   HPI Adam Larsen. presents to the office today for follow-up of MDD, GAD, insomnia, paranoia, and ADHD.  Describes mood today as "ok". Pleasant. Denies tearfulness. Mood symptoms - denies depression, irritability,  and anxiety. Mood is mostly consistent. Stating "I feel like I'm doing alright". Feels like medications are helpful. Dog getting better - tumor removal. He and father getting along better. Planning to start classes. Seeing Lanetta Inch for therapy. Improved interest and motivation. Taking medications as prescribed.  Energy levels stable. Active, does not have a regular exercise routine. Walking most days. Enjoys some usual interests and activities. Single. Lives with parents. Spending time with family.  Appetite adequate. Weight stable 240 to 250 pounds. Sleeps well most nights. Averages 7 to 8 hours a night. Focus and concentration improved. Diagnosed with ADHD in childhood. Completing tasks around the house.  Denies SI or HI.   Denies AH or VH.  Denies paranoia. Denies self harm. Denies substance use.  Previous medication: Vick Frees   [id number]    [place] ED from 05/16/2020 in [facility] Emergency Department at [facility]  PHQ-2 Total Score 6  PHQ-9 Total Score [place] ED from 04/19/2020 in [facility] Emergency Department at [place] Error: Q2 is Yes, you must answer 3, 4, and 5        Review of Systems:  Review of Systems  Musculoskeletal:  Negative for gait problem.  Neurological:  Negative for tremors.  Psychiatric/Behavioral:         Please refer to HPI    Medications: I have reviewed the patient's current medications.  Current Outpatient Medications  Medication Sig Dispense Refill   amitriptyline  (ELAVIL) 50 MG tablet Take 1 tablet (50 mg total) by mouth at bedtime. 30 tablet 2   Ascorbic Acid (VITAMIN C) 1000 MG tablet Take 2,000 mg by mouth daily.     buPROPion (WELLBUTRIN XL) 300 MG 24 hr tablet Take 1 tablet (300 mg total) by mouth daily. 30 tablet 5   cholecalciferol (VITAMIN D3) 25 MCG (1000 UNIT) tablet Take 2,000 Units by mouth daily.     loratadine (CLARITIN) 10 MG tablet Take 1 tablet (10 mg total) by mouth daily. 30 tablet 0   methylphenidate (CONCERTA) 36 MG PO CR tablet Take one tablet in the morning and one tablet at lunch. 60 tablet 0   Multiple Vitamin (MULTIVITAMIN) capsule Take 1 capsule by mouth daily.     omega-3 acid ethyl esters (LOVAZA) 1 g capsule Take 1 capsule (1 g total) by mouth 2 (two) times daily. 60 capsule 0   Omega-3 Fatty Acids (FISH OIL) 1000 MG CAPS Take 1,000 mg by mouth daily.     perphenazine (TRILAFON) 2 MG tablet Take 1 tablet (2 mg) by mouth with a 4 mg tablet twice a day (total of 6 mg bid) 60 tablet 5   perphenazine (TRILAFON) 4 MG tablet Take 1 tablet (4 mg) by mouth with a 2 mg tablet twice a day. (6 mg bid) 60 tablet 5   No current facility-administered medications for this visit.    Medication Side Effects: None  Allergies: No Known Allergies  Past Medical History:  Diagnosis Date   Anxiety    Bipolar 1 disorder (  [place])    Depression    Mania ([place])    Suicidal ideations     Past Medical History, Surgical history, Social history, and Family history were reviewed and updated as appropriate.   Please see review of systems for further details on the patient's review from today.   Objective:   Physical Exam:  There were no vitals taken for this visit.  Physical Exam Constitutional:      General: He is not in acute distress. Musculoskeletal:        General: No deformity.  Neurological:     Mental Status: He is alert and oriented to person, place, and time.     Coordination: Coordination normal.  Psychiatric:         Attention and Perception: Attention and perception normal. He does not perceive auditory or visual hallucinations.        Mood and Affect: Mood normal. Mood is not anxious or depressed. Affect is not labile, blunt, angry or inappropriate.        Speech: Speech normal.        Behavior: Behavior normal.        Thought Content: Thought content normal. Thought content is not paranoid or delusional. Thought content does not include homicidal or suicidal ideation. Thought content does not include homicidal or suicidal plan.        Cognition and Memory: Cognition and memory normal.        Judgment: Judgment normal.     Comments: Insight intact     Lab Review:     Component Value Date/Time   NA 140 05/16/2020 0912   K 3.6 05/16/2020 0912   CL 105 05/16/2020 0912   CO2 24 05/16/2020 0912   GLUCOSE 202 (H) 05/16/2020 0912   BUN 11 05/16/2020 0912   CREATININE 1.05 05/16/2020 0912   CALCIUM 9.6 05/16/2020 0912   PROT 7.5 05/16/2020 0912   ALBUMIN 4.3 05/16/2020 0912   AST 20 05/16/2020 0912   ALT 27 05/16/2020 0912   ALKPHOS 73 05/16/2020 0912   BILITOT 0.7 05/16/2020 0912   GFRNONAA >60 05/16/2020 0912   GFRAA >60 05/16/2020 0912       Component Value Date/Time   WBC 12.5 (H) 05/16/2020 0912   RBC 5.12 05/16/2020 0912   HGB 15.3 05/16/2020 0912   HCT 45.6 05/16/2020 0912   PLT 392 05/16/2020 0912   MCV 89.1 05/16/2020 0912   MCH 29.9 05/16/2020 0912   MCHC 33.6 05/16/2020 0912   RDW 12.7 05/16/2020 0912   LYMPHSABS 2.4 04/19/2020 1252   MONOABS 0.8 04/19/2020 1252   EOSABS 0.0 04/19/2020 1252   BASOSABS 0.1 04/19/2020 1252    No results found for: "POCLITH", "LITHIUM"   No results found for: "PHENYTOIN", "PHENOBARB", "VALPROATE", "CBMZ"   .res Assessment: Plan:    Plan:  1. Perphenazine 6mg  in am and 6mg  at bedtime   2. Wellbutrin XL 300mg  daily 3. Amitriptyline 50mg  at hs - one tablet at bedtime. 4. Concerta 36mg  twice daily.    Monitor BP between visits while  taking stimulant medication.   RTC 4 weeks  Discussed potential benefits, risks, and side effects of stimulants with patient to include increased heart rate, palpitations, insomnia, increased anxiety, increased irritability, or decreased appetite. Also discussed history of paranoia and to watch for any change in moods. Will also discuss changes with parents. Instructed patient to contact office if experiencing any significant tolerability issues.   Patient advised to contact office with any questions, adverse  effects, or acute worsening in signs and symptoms.  Discussed potential metabolic side effects associated with atypical antipsychotics, as well as potential risk for movement side effects. Advised pt to contact office if movement side effects occur.    Diagnoses and all orders for this visit:  ADHD, predominantly inattentive type -     methylphenidate (CONCERTA) 36 MG PO CR tablet; Take one tablet in the morning and one tablet at lunch.  Insomnia, unspecified type -     amitriptyline (ELAVIL) 50 MG tablet; Take 1 tablet (50 mg total) by mouth at bedtime.     Please see After Visit Summary for patient specific instructions.  Future Appointments  Date Time Provider [place]  12/06/2022  9:00 AM Anson [place], [facility] CP-CP None    No orders of the defined types were placed in this encounter.   -------------------------------

## 2022-12-06 ENCOUNTER — Ambulatory Visit (INDEPENDENT_AMBULATORY_CARE_PROVIDER_SITE_OTHER): Payer: 59 | Admitting: Mental Health

## 2022-12-06 DIAGNOSIS — R69 Illness, unspecified: Secondary | ICD-10-CM | POA: Diagnosis not present

## 2022-12-06 DIAGNOSIS — F331 Major depressive disorder, recurrent, moderate: Secondary | ICD-10-CM

## 2022-12-06 NOTE — Progress Notes (Signed)
[facility]  Name: Adam Larsen. Date:  12/06/22 MRN: [id number]   DOB: 1986-08-04 PCP: Patient, No Pcp Per  Time spent: 55 minutes  Treatment:  Individual therapy  Mental Status Exam:    Appearance:   Casual     Behavior:  Appropriate  Motor:  Normal  Speech/Language:   Clear and Coherent  Affect:  full range  Mood:  Anxious, pleasant  Thought process:  normal  Thought content:    WNL  Sensory/Perceptual disturbances:    none  Orientation:  x4  Attention:  Good  Concentration:  Good  Memory:  WNL  Fund of knowledge:   Good  Insight:    Good  Judgment:   Good  Impulse Control:  Good   Reported Symptoms:  Sleep problems-wakes in the middle of the night, low motivation, isolative, intermittent depressed mood, anxiety, struggles with daily hygiene (not showering, brushing teeth etc), self depreciating thoughts  Risk Assessment: Danger to Self:  No Self-injurious Behavior: No Danger to Others: No Duty to Warn:no Physical Aggression / Violence:No  Access to Firearms a concern: No  Gang Involvement:No  Patient / guardian was educated about steps to take if suicide or homicide risk level increases between visits: yes While future psychiatric events cannot be accurately predicted, the patient does not currently require acute inpatient psychiatric care and does not currently meet [facility] involuntary commitment criteria.    Medications: Current Outpatient Medications  Medication Sig Dispense Refill   amitriptyline (ELAVIL) 50 MG tablet Take 1 tablet (50 mg total) by mouth at bedtime. 30 tablet 2   Ascorbic Acid (VITAMIN C) 1000 MG tablet Take 2,000 mg by mouth daily.     buPROPion (WELLBUTRIN XL) 300 MG 24 hr tablet Take 1 tablet (300 mg total) by mouth daily. 30 tablet 5   cholecalciferol (VITAMIN D3) 25 MCG (1000 UNIT) tablet Take 2,000 Units by mouth daily.     loratadine (CLARITIN) 10 MG tablet Take 1 tablet (10 mg total) by mouth daily. 30  tablet 0   methylphenidate (CONCERTA) 36 MG PO CR tablet Take one tablet in the morning and one tablet at lunch. 60 tablet 0   Multiple Vitamin (MULTIVITAMIN) capsule Take 1 capsule by mouth daily.     omega-3 acid ethyl esters (LOVAZA) 1 g capsule Take 1 capsule (1 g total) by mouth 2 (two) times daily. 60 capsule 0   Omega-3 Fatty Acids (FISH OIL) 1000 MG CAPS Take 1,000 mg by mouth daily.     perphenazine (TRILAFON) 2 MG tablet Take 1 tablet (2 mg) by mouth with a 4 mg tablet twice a day (total of 6 mg bid) 60 tablet 5   perphenazine (TRILAFON) 4 MG tablet Take 1 tablet (4 mg) by mouth with a 2 mg tablet twice a day. (6 mg bid) 60 tablet 5   No current facility-administered medications for this visit.    Subjective: Patient presents for session on time.  Assessed progress.  He shared concerns related to his niece, life decision she's making recently. He said he disagrees with many of them, but they are close and he plans to talk to her. Explored collaboratively ways to communicate his feelings effectively as he identified this is a need. Even on to share concerns related to his brother who is potentially to return back to the family him in April. He said that he's continued to not work and have no other life goals while his parents continue to pay for his  apartment. Patient is concerned about his brother and we explored ways to communicate with his brother as well as his parents. He stated that he's following through with his own plans tour obtaining a computer certification. He said that he's completed paperwork with support from his caseworker through [facility]. He said the computer coursework will last about 6 months and he hopes to find a job immediately after and potentially move out of his parents home.  Interventions: CBT, supportive therapy, problem solving strategies  Diagnoses:    ICD-10-CM   1. Major depressive disorder, recurrent episode, moderate ([place])  F33.1              Plan: Patient is to use CBT, mindfulness and coping skills to help decrease symptoms associated with their diagnosis.  Patient to look into employment options as well as college courses to eventually obtain more independence.    Long-term goal:   Reduce overall level, frequency, and intensity of the feelings of depression and anxiety 7-8/10 to a 0-2/10 in severity for at least 3 consecutive months.   Short-term goal:  Decrease anxiety producing self talk such as thinking of the worse possible life outcome Decrease ruminating, which can affect his sleep and increased emotional distress Continue to take medications as prescribed Increased self-confidence by engaging in an exercise regimen, working out Increase steps toward independence by finding employment and eventually moving out of this parent's home    Assessment of progress:  progressing  Anson [place], [facility]

## 2022-12-13 ENCOUNTER — Telehealth: Payer: Self-pay | Admitting: Adult Health

## 2022-12-13 ENCOUNTER — Other Ambulatory Visit: Payer: Self-pay

## 2022-12-13 DIAGNOSIS — F900 Attention-deficit hyperactivity disorder, predominantly inattentive type: Secondary | ICD-10-CM

## 2022-12-13 DIAGNOSIS — G4700 Insomnia, unspecified: Secondary | ICD-10-CM

## 2022-12-13 MED ORDER — AMITRIPTYLINE HCL 50 MG PO TABS: 50.0000 mg | ORAL_TABLET | Freq: Every day | ORAL | 2 refills | Status: DC

## 2022-12-13 NOTE — Telephone Encounter (Signed)
Pt lvm that he would like his amitriptyline 50 mg and his concerta 36 mg sent to the [facility] on [address]

## 2022-12-13 NOTE — Telephone Encounter (Signed)
Amitriptyline sent. Will pend Concerta.  Canceled at [facility].

## 2022-12-16 MED ORDER — METHYLPHENIDATE HCL ER (OSM) 36 MG PO TBCR: EXTENDED_RELEASE_TABLET | ORAL | 0 refills | Status: DC

## 2022-12-17 ENCOUNTER — Other Ambulatory Visit: Payer: Self-pay

## 2022-12-17 ENCOUNTER — Telehealth: Payer: Self-pay | Admitting: Adult Health

## 2022-12-17 DIAGNOSIS — F900 Attention-deficit hyperactivity disorder, predominantly inattentive type: Secondary | ICD-10-CM

## 2022-12-17 MED ORDER — METHYLPHENIDATE HCL ER (OSM) 36 MG PO TBCR: EXTENDED_RELEASE_TABLET | ORAL | 0 refills | Status: DC

## 2022-12-17 NOTE — Telephone Encounter (Signed)
Pended.

## 2022-12-17 NOTE — Telephone Encounter (Signed)
Next appt is 12/24/22. Liliane Channel had his Concerta 36 mg sent to [facility] at [facility] but it was not in stock. He would like to have his Concerta 36 mg called to:  [facility], [address], [place], [place] [address]. Phone # is [phone]

## 2022-12-23 ENCOUNTER — Telehealth: Payer: Self-pay | Admitting: General Practice

## 2022-12-23 NOTE — Telephone Encounter (Signed)
PT and mom Adam Larsen) call today in regards to PT getting set up as a new patient with our facility. PT's mom is a patient of Dr.Burns and was originally wanting her son to be set up with her. I informed them that Dr.Burns was not technically taking new patients in and that I would have to get an okay before proceeding.   I did set up a new patient appointment for them with Dr.Sagardia just incase though.  CB for PT: [phone]  PT also dealing with some swelling in the legs and ankle and had a rash in the past that I had advised they go check out urgent care for.

## 2022-12-24 ENCOUNTER — Ambulatory Visit: Payer: 59 | Admitting: Adult Health

## 2022-12-25 ENCOUNTER — Ambulatory Visit
Admission: EM | Admit: 2022-12-25 | Discharge: 2022-12-25 | Disposition: A | Discharge status: Home / Self Care | Payer: 59 | Attending: Urgent Care | Admitting: Urgent Care

## 2022-12-25 DIAGNOSIS — F319 Bipolar disorder, unspecified: Secondary | ICD-10-CM

## 2022-12-25 DIAGNOSIS — R21 Rash and other nonspecific skin eruption: Secondary | ICD-10-CM | POA: Diagnosis not present

## 2022-12-25 MED ORDER — HYDROXYZINE HCL 25 MG PO TABS: 12.5000 mg | ORAL_TABLET | Freq: Three times a day (TID) | ORAL | 0 refills | Status: DC | PRN

## 2022-12-25 NOTE — ED Provider Notes (Addendum)
[facility] - URGENT [facility]  Note:  This document was prepared using Systems analyst and may include unintentional dictation errors.  MRN: [id number] DOB: [birth year]  Subjective:   Adam Larsen. is a 37 y.o. male presenting for 2-week history of persistent intermittent rashes.  Initially he had over the left foot that spread all the way up his left leg and onto the left side of his torso including across his stomach.  This resolved on its own.  He has since developed 40 history of intermittent swelling of the right foot without pain.  Reports that he has had some dizziness, 1 episode of chest pain last night after eating a large spicy meal.  Resolved on its own without intervention.  No drug use.  Patient is a smoker.  He also has a history of bipolar disorder and is on multiple mental health medications including amitriptyline, bupropion, perphenazine.  He was recently started on Concerta 2 months ago.  He actually stopped this medication last night out of concern that he may be having an allergic reaction.  Has not followed up with his PCP.  No facial swelling, oral swelling, drainage of pus or bleeding from the rash, nausea, vomiting, abdominal pain, hematuria.  No tick bites.  No current facility-administered medications for this encounter.  Current Outpatient Medications:    amitriptyline (ELAVIL) 50 MG tablet, Take 1 tablet (50 mg total) by mouth at bedtime., Disp: 30 tablet, Rfl: 2   Ascorbic Acid (VITAMIN C) 1000 MG tablet, Take 2,000 mg by mouth daily., Disp: , Rfl:    buPROPion (WELLBUTRIN XL) 300 MG 24 hr tablet, Take 1 tablet (300 mg total) by mouth daily., Disp: 30 tablet, Rfl: 5   cholecalciferol (VITAMIN D3) 25 MCG (1000 UNIT) tablet, Take 2,000 Units by mouth daily., Disp: , Rfl:    loratadine (CLARITIN) 10 MG tablet, Take 1 tablet (10 mg total) by mouth daily., Disp: 30 tablet, Rfl: 0   methylphenidate (CONCERTA) 36 MG PO CR tablet, Take one tablet  in the morning and one tablet at lunch., Disp: 60 tablet, Rfl: 0   Multiple Vitamin (MULTIVITAMIN) capsule, Take 1 capsule by mouth daily., Disp: , Rfl:    omega-3 acid ethyl esters (LOVAZA) 1 g capsule, Take 1 capsule (1 g total) by mouth 2 (two) times daily., Disp: 60 capsule, Rfl: 0   Omega-3 Fatty Acids (FISH OIL) 1000 MG CAPS, Take 1,000 mg by mouth daily., Disp: , Rfl:    perphenazine (TRILAFON) 2 MG tablet, Take 1 tablet (2 mg) by mouth with a 4 mg tablet twice a day (total of 6 mg bid), Disp: 60 tablet, Rfl: 5   perphenazine (TRILAFON) 4 MG tablet, Take 1 tablet (4 mg) by mouth with a 2 mg tablet twice a day. (6 mg bid), Disp: 60 tablet, Rfl: 5   No Known Allergies  Past Medical History:  Diagnosis Date   Anxiety    Bipolar 1 disorder ([place])    Depression    Mania ([place])    Suicidal ideations      Past Surgical History:  Procedure Laterality Date   WISDOM TOOTH EXTRACTION      Family History  Problem Relation Age of Onset   Schizophrenia Paternal Grandmother     Social History   Tobacco Use   Smoking status: Every Day    Types: E-cigarettes   Smokeless tobacco: Never  Vaping Use   Vaping Use: Every day   Substances: Nicotine, Flavoring, Nicotine-salt  Substance Use Topics   Alcohol use: Never   Drug use: Never    ROS   Objective:   Vitals: BP 117/75 (BP Location: Right Arm)   Pulse (!) 101   Temp 98.7 F (37.1 C) (Oral)   Resp 18   SpO2 96%   Physical Exam Constitutional:      General: He is not in acute distress.    Appearance: Normal appearance. He is well-developed and normal weight. He is not ill-appearing, toxic-appearing or diaphoretic.  HENT:     Head: Normocephalic and atraumatic.     Right Ear: External ear normal.     Left Ear: External ear normal.     Nose: Nose normal. No congestion or rhinorrhea.     Mouth/Throat:     Mouth: Mucous membranes are moist.     Pharynx: No pharyngeal swelling, oropharyngeal exudate, posterior  oropharyngeal erythema or uvula swelling.     Tonsils: No tonsillar exudate or tonsillar abscesses. 0 on the right. 0 on the left.  Eyes:     General: No scleral icterus.       Right eye: No discharge.        Left eye: No discharge.     Extraocular Movements: Extraocular movements intact.  Cardiovascular:     Rate and Rhythm: Normal rate and regular rhythm.     Heart sounds: Normal heart sounds. No murmur heard.    No friction rub. No gallop.  Pulmonary:     Effort: Pulmonary effort is normal. No respiratory distress.     Breath sounds: Normal breath sounds. No stridor. No wheezing, rhonchi or rales.  Musculoskeletal:        General: Swelling (right foot, trace) present. No tenderness.     Cervical back: Normal range of motion.     Right lower leg: No edema.     Left lower leg: No edema.  Skin:    Findings: Rash (resolving slight macular lesions over the left lower leg only) present.  Neurological:     Mental Status: He is alert and oriented to person, place, and time.     Cranial Nerves: No cranial nerve deficit.     Motor: No weakness.     Coordination: Coordination normal.     Gait: Gait normal.  Psychiatric:        Mood and Affect: Mood normal.        Behavior: Behavior normal.        Thought Content: Thought content normal.        Judgment: Judgment normal.     Assessment and Plan :   PDMP not reviewed this encounter.  1. Rash and nonspecific skin eruption   2. Bipolar affective disorder, remission status unspecified (HCC)     Undifferentiated rash.  Will avoid prednisone.  Recommended hydroxyzine.  He can continue to hold off on Concerta.  He is agreeable to a panel of lab test.  Recommended he contact his mental health provider soon as possible to discuss the use of Concerta. Counseled patient on potential for adverse effects with medications prescribed/recommended today, ER and return-to-clinic precautions discussed, patient verbalized understanding.     Jaynee Eagles, PA-C 12/25/22 1254

## 2022-12-25 NOTE — ED Triage Notes (Addendum)
Pt c/o of Rash left foot spread up left leg to stomach,that started 2 weeks ago, mostly resolved only on foot now at this time, no itching .  Two weeks ago had dizziness with swallowing water.  Pt c/o Swollen Right foot, 3-4 days, last night chest pain right after eating dinner (ate spicy chicken patty, kale, rice and zucchini for dinner), pain is located at center to left side of chest that resolved around 9-10pm last night.  Concerta twice a day for two months is only new medication.  New PCP appt is in June.

## 2022-12-25 NOTE — Telephone Encounter (Signed)
I am trying to limit new patients so I think since he is set up with Dr. Mitchel Honour that would be best.

## 2022-12-26 LAB — COMPREHENSIVE METABOLIC PANEL
ALT: 31 IU/L (ref 0–44)
AST: 16 IU/L (ref 0–40)
Albumin/Globulin Ratio: 1.9 (ref 1.2–2.2)
Albumin: 4.6 g/dL (ref 4.1–5.1)
Alkaline Phosphatase: 83 IU/L (ref 44–121)
BUN/Creatinine Ratio: 12 (ref 9–20)
BUN: 11 mg/dL (ref 6–20)
Bilirubin Total: 0.3 mg/dL (ref 0.0–1.2)
CO2: 23 mmol/L (ref 20–29)
Calcium: 10.1 mg/dL (ref 8.7–10.2)
Chloride: 102 mmol/L (ref 96–106)
Creatinine, Ser: 0.92 mg/dL (ref 0.76–1.27)
Globulin, Total: 2.4 g/dL (ref 1.5–4.5)
Glucose: 96 mg/dL (ref 70–99)
Potassium: 4.1 mmol/L (ref 3.5–5.2)
Sodium: 141 mmol/L (ref 134–144)
Total Protein: 7 g/dL (ref 6.0–8.5)
eGFR: 111 mL/min/{1.73_m2} (ref >59)

## 2022-12-26 LAB — CBC WITH DIFFERENTIAL/PLATELET
Basophils Absolute: 0.1 10*3/uL (ref 0.0–0.2)
Basos: 1 %
EOS (ABSOLUTE): 0.1 10*3/uL (ref 0.0–0.4)
Eos: 1 %
Hematocrit: 44.4 % (ref 37.5–51.0)
Hemoglobin: 15.4 g/dL (ref 13.0–17.7)
Immature Grans (Abs): 0 10*3/uL (ref 0.0–0.1)
Immature Granulocytes: 0 %
Lymphocytes Absolute: 3.3 10*3/uL — ABNORMAL HIGH (ref 0.7–3.1)
Lymphs: 36 %
MCH: 31.9 pg (ref 26.6–33.0)
MCHC: 34.7 g/dL (ref 31.5–35.7)
MCV: 92 fL (ref 79–97)
Monocytes Absolute: 0.6 10*3/uL (ref 0.1–0.9)
Monocytes: 7 %
Neutrophils Absolute: 5 10*3/uL (ref 1.4–7.0)
Neutrophils: 55 %
Platelets: 336 10*3/uL (ref 150–450)
RBC: 4.83 x10E6/uL (ref 4.14–5.80)
RDW: 12.4 % (ref 11.6–15.4)
WBC: 9.2 10*3/uL (ref 3.4–10.8)

## 2022-12-26 LAB — HIV ANTIBODY (ROUTINE TESTING W REFLEX): HIV Screen 4th Generation wRfx: NONREACTIVE

## 2022-12-26 LAB — RPR: RPR Ser Ql: NONREACTIVE

## 2022-12-31 ENCOUNTER — Ambulatory Visit (INDEPENDENT_AMBULATORY_CARE_PROVIDER_SITE_OTHER): Payer: 59 | Admitting: Mental Health

## 2022-12-31 DIAGNOSIS — F331 Major depressive disorder, recurrent, moderate: Secondary | ICD-10-CM

## 2022-12-31 NOTE — Progress Notes (Signed)
[facility]  Name: Adam Larsen. Date:  12/31/22 MRN: [id number]   DOB: 09/03/1986 PCP: Patient, No Pcp Per  Time spent: 53 minutes  Treatment:  Individual therapy  Mental Status Exam:    Appearance:   Casual     Behavior:  Appropriate  Motor:  Normal  Speech/Language:   Clear and Coherent  Affect:  full range  Mood:  Anxious, pleasant  Thought process:  normal  Thought content:    WNL  Sensory/Perceptual disturbances:    none  Orientation:  x4  Attention:  Good  Concentration:  Good  Memory:  WNL  Fund of knowledge:   Good  Insight:    Good  Judgment:   Good  Impulse Control:  Good   Reported Symptoms:  Sleep problems-wakes in the middle of the night, low motivation, isolative, intermittent depressed mood, anxiety, struggles with daily hygiene (not showering, brushing teeth etc), self depreciating thoughts  Risk Assessment: Danger to Self:  No Self-injurious Behavior: No Danger to Others: No Duty to Warn:no Physical Aggression / Violence:No  Access to Firearms a concern: No  Gang Involvement:No  Patient / guardian was educated about steps to take if suicide or homicide risk level increases between visits: yes While future psychiatric events cannot be accurately predicted, the patient does not currently require acute inpatient psychiatric care and does not currently meet [facility] involuntary commitment criteria.    Medications: Current Outpatient Medications  Medication Sig Dispense Refill   amitriptyline (ELAVIL) 50 MG tablet Take 1 tablet (50 mg total) by mouth at bedtime. 30 tablet 2   Ascorbic Acid (VITAMIN C) 1000 MG tablet Take 2,000 mg by mouth daily.     buPROPion (WELLBUTRIN XL) 300 MG 24 hr tablet Take 1 tablet (300 mg total) by mouth daily. 30 tablet 5   cholecalciferol (VITAMIN D3) 25 MCG (1000 UNIT) tablet Take 2,000 Units by mouth daily.     hydrOXYzine (ATARAX) 25 MG tablet Take 0.5-1 tablets (12.5-25 mg total) by mouth  every 8 (eight) hours as needed for itching. 30 tablet 0   loratadine (CLARITIN) 10 MG tablet Take 1 tablet (10 mg total) by mouth daily. 30 tablet 0   methylphenidate (CONCERTA) 36 MG PO CR tablet Take one tablet in the morning and one tablet at lunch. 60 tablet 0   Multiple Vitamin (MULTIVITAMIN) capsule Take 1 capsule by mouth daily.     omega-3 acid ethyl esters (LOVAZA) 1 g capsule Take 1 capsule (1 g total) by mouth 2 (two) times daily. 60 capsule 0   Omega-3 Fatty Acids (FISH OIL) 1000 MG CAPS Take 1,000 mg by mouth daily.     perphenazine (TRILAFON) 2 MG tablet Take 1 tablet (2 mg) by mouth with a 4 mg tablet twice a day (total of 6 mg bid) 60 tablet 5   perphenazine (TRILAFON) 4 MG tablet Take 1 tablet (4 mg) by mouth with a 2 mg tablet twice a day. (6 mg bid) 60 tablet 5   No current facility-administered medications for this visit.    Subjective: Patient presents for session on time.  Assessed progress.  He shared how he continues to plan on taking steps forward in his trying to achieve more independence.  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  He stated that upon further discussion with his case worker, he learned that it will be based on  his father's income if he is to receive any financial assistance.  He stated that he was unaware of this although they have spoken about this program for the past few months.  He stated that he is planning to follow through regardless and as a week long program for about 8 to 9 hours/day.  He had some concerns about his ability to try and set in the classroom setting and try to focus for that length of time.  Patient continues to take his medications to assist with focus and continues to report that they are helpful and effective.  Worked with patient to identify associated thoughts that leave him feeling unsure of himself and  anxious and worked with him to reframe throughout the session.   Interventions:   supportive therapy, problem solving strategies  Diagnoses:    ICD-10-CM   1. Major depressive disorder, recurrent episode, moderate ([place])  F33.1              Plan: Patient is to use CBT, mindfulness and coping skills to help decrease symptoms associated with their diagnosis.  Patient to look into employment options as well as college courses to eventually obtain more independence.    Long-term goal:   Reduce overall level, frequency, and intensity of the feelings of depression and anxiety 7-8/10 to a 0-2/10 in severity for at least 3 consecutive months.   Short-term goal:  Decrease anxiety producing self talk such as thinking of the worse possible life outcome Decrease ruminating, which can affect his sleep and increased emotional distress Continue to take medications as prescribed Increased self-confidence by engaging in an exercise regimen, working out Increase steps toward independence by finding employment and eventually moving out of this parent's home    Assessment of progress:  progressing  Anson [place], [facility]

## 2023-01-13 ENCOUNTER — Ambulatory Visit (INDEPENDENT_AMBULATORY_CARE_PROVIDER_SITE_OTHER): Payer: 59 | Admitting: Mental Health

## 2023-01-13 DIAGNOSIS — F331 Major depressive disorder, recurrent, moderate: Secondary | ICD-10-CM

## 2023-01-13 NOTE — Progress Notes (Addendum)
[facility]  Name: Adam Larsen. Date:  01/13/23 MRN: [id number]   DOB: November 29, 1985 PCP: Patient, No Pcp Per  Time spent: 52 minutes  Treatment:  Individual therapy  Mental Status Exam:    Appearance:   Casual     Behavior:  Appropriate  Motor:  Normal  Speech/Language:   Clear and Coherent  Affect:  full range  Mood:  Anxious, pleasant  Thought process:  normal  Thought content:    WNL  Sensory/Perceptual disturbances:    none  Orientation:  x4  Attention:  Good  Concentration:  Good  Memory:  WNL  Fund of knowledge:   Good  Insight:    Good  Judgment:   Good  Impulse Control:  Good   Reported Symptoms:  Sleep problems-wakes in the middle of the night, low motivation, isolative, intermittent depressed mood, anxiety, struggles with daily hygiene (not showering, brushing teeth etc), self depreciating thoughts  Risk Assessment: Danger to Self:  No Self-injurious Behavior: No Danger to Others: No Duty to Warn:no Physical Aggression / Violence:No  Access to Firearms a concern: No  Gang Involvement:No  Patient / guardian was educated about steps to take if suicide or homicide risk level increases between visits: yes While future psychiatric events cannot be accurately predicted, the patient does not currently require acute inpatient psychiatric care and does not currently meet [facility] involuntary commitment criteria.    Medications: Current Outpatient Medications  Medication Sig Dispense Refill   amitriptyline (ELAVIL) 50 MG tablet Take 1 tablet (50 mg total) by mouth at bedtime. 30 tablet 2   Ascorbic Acid (VITAMIN C) 1000 MG tablet Take 2,000 mg by mouth daily.     buPROPion (WELLBUTRIN XL) 300 MG 24 hr tablet Take 1 tablet (300 mg total) by mouth daily. 30 tablet 5   cholecalciferol (VITAMIN D3) 25 MCG (1000 UNIT) tablet Take 2,000 Units by mouth daily.     hydrOXYzine (ATARAX) 25 MG tablet Take 0.5-1 tablets (12.5-25 mg total) by mouth  every 8 (eight) hours as needed for itching. 30 tablet 0   loratadine (CLARITIN) 10 MG tablet Take 1 tablet (10 mg total) by mouth daily. 30 tablet 0   methylphenidate (CONCERTA) 36 MG PO CR tablet Take one tablet in the morning and one tablet at lunch. 60 tablet 0   Multiple Vitamin (MULTIVITAMIN) capsule Take 1 capsule by mouth daily.     omega-3 acid ethyl esters (LOVAZA) 1 g capsule Take 1 capsule (1 g total) by mouth 2 (two) times daily. 60 capsule 0   Omega-3 Fatty Acids (FISH OIL) 1000 MG CAPS Take 1,000 mg by mouth daily.     perphenazine (TRILAFON) 2 MG tablet Take 1 tablet (2 mg) by mouth with a 4 mg tablet twice a day (total of 6 mg bid) 60 tablet 5   perphenazine (TRILAFON) 4 MG tablet Take 1 tablet (4 mg) by mouth with a 2 mg tablet twice a day. (6 mg bid) 60 tablet 5   No current facility-administered medications for this visit.    Subjective: Patient presents for session on time.  Assessed progress.  Patient shared concern about his brother.  He stated that his parents have been paying his rent for the past couple of years and have told him that he will have to move back home next month if he does not secure a job.  Patient stated he is worried his brother will become more depressed if he is to move back home,  has encouraged his father to insist that his brother get a psychiatric assessment however, he stated that his father and mother both have some stigma towards mental health treatment.  He stated that he has reached out to his brother to provide support, offering to help him find a job.  He went on to share how he plans to talk to his case worker through [employer] about other [employer] with which she could enroll as he has concerns about enrolling in 1 that would be 8 hours/day for 1 week.  Patient stated he feels he will be too overwhelmed to participate in that level of intensity.  He shared how he has some anxiety at times related to having thoughts about  conspiracy theory types of situations, how this is infrequent but reminds him of when he was coping with delusions back during his last hospitalization.  He has insight into them being delusions and shared his attempts to ground himself with logical, pragmatic thoughts.  These were explored collaboratively as well in session and encouraged him to continue between sessions to cope.  Interventions:   supportive therapy, problem solving strategies  Diagnoses:    ICD-10-CM   1. Major depressive disorder, recurrent episode, moderate ([place])  F33.1         Plan: Patient is to use CBT, mindfulness and coping skills to help decrease symptoms associated with their diagnosis.  Patient to look into employment options as well as college courses to eventually obtain more independence.    Long-term goal:   Reduce overall level, frequency, and intensity of the feelings of depression and anxiety 7-8/10 to a 0-2/10 in severity for at least 3 consecutive months.   Short-term goal:  Decrease anxiety producing self talk such as thinking of the worse possible life outcome Decrease ruminating, which can affect his sleep and increased emotional distress Continue to take medications as prescribed Increased self-confidence by engaging in an exercise regimen, working out Increase steps toward independence by finding employment and eventually moving out of this parent's home    Assessment of progress:  progressing  Anson [place], [facility]

## 2023-01-14 ENCOUNTER — Encounter: Payer: Self-pay | Admitting: Adult Health

## 2023-01-14 ENCOUNTER — Ambulatory Visit: Payer: 59 | Admitting: Adult Health

## 2023-01-14 DIAGNOSIS — F411 Generalized anxiety disorder: Secondary | ICD-10-CM

## 2023-01-14 DIAGNOSIS — F331 Major depressive disorder, recurrent, moderate: Secondary | ICD-10-CM

## 2023-01-14 DIAGNOSIS — G4700 Insomnia, unspecified: Secondary | ICD-10-CM

## 2023-01-14 DIAGNOSIS — F900 Attention-deficit hyperactivity disorder, predominantly inattentive type: Secondary | ICD-10-CM

## 2023-01-14 MED ORDER — METHYLPHENIDATE HCL ER (OSM) 36 MG PO TBCR: EXTENDED_RELEASE_TABLET | ORAL | 0 refills | Status: DC

## 2023-01-14 NOTE — Progress Notes (Signed)
Adam Larsen [id number] 1986/06/07 37 y.o.  Subjective:   Patient ID:  Adam Larsen. is a 37 y.o. (DOB 17-Apr-1986) male.  Chief Complaint: No chief complaint on file.   HPI Adam Larsen. presents to the office today for follow-up of MDD, GAD, insomnia, paranoia, and ADHD.  Describes mood today as "ok". Pleasant. Denies tearfulness. Mood symptoms - denies depression, irritability,  and anxiety. Mood is consistent. Trying to figure out what classes he wants to take. Stating "I feel like I'm doing ok". Feels like medications are helpful. Dog doing better - surgery. He and father getting along better. Seeing Lanetta Inch for therapy. Improved interest and motivation. Taking medications as prescribed.  Energy levels stable. Active, has a regular exercise routine. Walking daily. Enjoys some usual interests and activities. Single. Lives with parents. Spending time with family.  Appetite adequate. Weight stable 240 to 250 pounds. Sleeps well most nights. Averages 7 to 8 hours a night. Focus and concentration improved. Diagnosed with ADHD in childhood. Completing tasks around the house.  Denies SI or HI.   Denies AH or VH.  Denies paranoia. Denies self harm. Denies substance use.  Previous medication: Vick Frees   [id number]    [place] [facility] from 05/16/2020 in [facility] Emergency Department at [facility]  PHQ-2 Total Score 6  PHQ-9 Total Score [place] [facility] from 12/25/2022 in [facility] at [facility]) ED from 04/19/2020 in [facility] Emergency Department at [place] No Risk Error: Q2 is Yes, you must answer 3, 4, and 5        Review of Systems:  Review of Systems  Musculoskeletal:  Negative for gait problem.  Neurological:  Negative for tremors.  Psychiatric/Behavioral:         Please refer to HPI    Medications: I have reviewed the patient's current  medications.  Current Outpatient Medications  Medication Sig Dispense Refill   amitriptyline (ELAVIL) 50 MG tablet Take 1 tablet (50 mg total) by mouth at bedtime. 30 tablet 2   Ascorbic Acid (VITAMIN C) 1000 MG tablet Take 2,000 mg by mouth daily.     buPROPion (WELLBUTRIN XL) 300 MG 24 hr tablet Take 1 tablet (300 mg total) by mouth daily. 30 tablet 5   cholecalciferol (VITAMIN D3) 25 MCG (1000 UNIT) tablet Take 2,000 Units by mouth daily.     hydrOXYzine (ATARAX) 25 MG tablet Take 0.5-1 tablets (12.5-25 mg total) by mouth every 8 (eight) hours as needed for itching. 30 tablet 0   loratadine (CLARITIN) 10 MG tablet Take 1 tablet (10 mg total) by mouth daily. 30 tablet 0   methylphenidate (CONCERTA) 36 MG PO CR tablet Take one tablet in the morning and one tablet at lunch. 60 tablet 0   Multiple Vitamin (MULTIVITAMIN) capsule Take 1 capsule by mouth daily.     omega-3 acid ethyl esters (LOVAZA) 1 g capsule Take 1 capsule (1 g total) by mouth 2 (two) times daily. 60 capsule 0   Omega-3 Fatty Acids (FISH OIL) 1000 MG CAPS Take 1,000 mg by mouth daily.     perphenazine (TRILAFON) 2 MG tablet Take 1 tablet (2 mg) by mouth with a 4 mg tablet twice a day (total of 6 mg bid) 60 tablet 5   perphenazine (TRILAFON) 4 MG tablet Take 1 tablet (4 mg) by mouth with a 2 mg tablet twice a day. (6 mg  bid) 60 tablet 5   No current facility-administered medications for this visit.    Medication Side Effects: None  Allergies: No Known Allergies  Past Medical History:  Diagnosis Date   Anxiety    Bipolar 1 disorder ([place])    Depression    Mania ([place])    Suicidal ideations     Past Medical History, Surgical history, Social history, and Family history were reviewed and updated as appropriate.   Please see review of systems for further details on the patient's review from today.   Objective:   Physical Exam:  There were no vitals taken for this visit.  Physical Exam Constitutional:      General:  He is not in acute distress. Cardiovascular:     Heart sounds:     No gallop.  Musculoskeletal:        General: No deformity.  Neurological:     Mental Status: He is alert and oriented to person, place, and time.     Coordination: Coordination normal.  Psychiatric:        Attention and Perception: Attention and perception normal. He does not perceive auditory or visual hallucinations.        Mood and Affect: Mood normal. Mood is not anxious or depressed. Affect is not labile, blunt, angry or inappropriate.        Speech: Speech normal.        Behavior: Behavior normal.        Thought Content: Thought content normal. Thought content is not paranoid or delusional. Thought content does not include homicidal or suicidal ideation. Thought content does not include homicidal or suicidal plan.        Cognition and Memory: Cognition and memory normal.        Judgment: Judgment normal.     Comments: Insight intact     Lab Review:     Component Value Date/Time   NA 141 12/25/2022 1303   K 4.1 12/25/2022 1303   CL 102 12/25/2022 1303   CO2 23 12/25/2022 1303   GLUCOSE 96 12/25/2022 1303   GLUCOSE 202 (H) 05/16/2020 0912   BUN 11 12/25/2022 1303   CREATININE 0.92 12/25/2022 1303   CALCIUM 10.1 12/25/2022 1303   PROT 7.0 12/25/2022 1303   ALBUMIN 4.6 12/25/2022 1303   AST 16 12/25/2022 1303   ALT 31 12/25/2022 1303   ALKPHOS 83 12/25/2022 1303   BILITOT 0.3 12/25/2022 1303   GFRNONAA >60 05/16/2020 0912   GFRAA >60 05/16/2020 0912       Component Value Date/Time   WBC 9.2 12/25/2022 1303   WBC 12.5 (H) 05/16/2020 0912   RBC 4.83 12/25/2022 1303   RBC 5.12 05/16/2020 0912   HGB 15.4 12/25/2022 1303   HCT 44.4 12/25/2022 1303   PLT 336 12/25/2022 1303   MCV 92 12/25/2022 1303   MCH 31.9 12/25/2022 1303   MCH 29.9 05/16/2020 0912   MCHC 34.7 12/25/2022 1303   MCHC 33.6 05/16/2020 0912   RDW 12.4 12/25/2022 1303   LYMPHSABS 3.3 (H) 12/25/2022 1303   MONOABS 0.8 04/19/2020  1252   EOSABS 0.1 12/25/2022 1303   BASOSABS 0.1 12/25/2022 1303    No results found for: "POCLITH", "LITHIUM"   No results found for: "PHENYTOIN", "PHENOBARB", "VALPROATE", "CBMZ"   .res Assessment: Plan:    Plan:  1. Perphenazine 6mg  in am and 6mg  at bedtime   2. Wellbutrin XL 300mg  daily 3. Amitriptyline 50mg  at hs - one tablet at bedtime. 4. Concerta  36mg  twice daily. Gave one paper copy  Monitor BP between visits while taking stimulant medication.   RTC 4 weeks  Discussed potential benefits, risks, and side effects of stimulants with patient to include increased heart rate, palpitations, insomnia, increased anxiety, increased irritability, or decreased appetite. Also discussed history of paranoia and to watch for any change in moods. Will also discuss changes with parents. Instructed patient to contact office if experiencing any significant tolerability issues.   Patient advised to contact office with any questions, adverse effects, or acute worsening in signs and symptoms.  Discussed potential metabolic side effects associated with atypical antipsychotics, as well as potential risk for movement side effects. Advised pt to contact office if movement side effects occur.    Diagnoses and all orders for this visit:  Major depressive disorder, recurrent episode, moderate (HCC)  ADHD, predominantly inattentive type -     methylphenidate (CONCERTA) 36 MG PO CR tablet; Take one tablet in the morning and one tablet at lunch.  Insomnia, unspecified type  GAD (generalized anxiety disorder)     Please see After Visit Summary for patient specific instructions.  Future Appointments  Date Time Provider [place]  01/27/2023  8:00 AM [place], [facility] CP-CP None  02/11/2023  9:00 AM [place], [facility] CP-CP None  02/24/2023  8:00 AM [place], [facility] CP-CP None  03/11/2023  8:00 AM [place], [facility] CP-CP None  03/24/2023 10:00 AM  Horald Pollen, MD [facility] None    No orders of the defined types were placed in this encounter.   -------------------------------

## 2023-01-24 ENCOUNTER — Ambulatory Visit (INDEPENDENT_AMBULATORY_CARE_PROVIDER_SITE_OTHER): Payer: 59

## 2023-01-24 ENCOUNTER — Ambulatory Visit
Admission: EM | Admit: 2023-01-24 | Discharge: 2023-01-24 | Disposition: A | Discharge status: Home / Self Care | Payer: 59 | Attending: Nurse Practitioner | Admitting: Nurse Practitioner

## 2023-01-24 DIAGNOSIS — R051 Acute cough: Secondary | ICD-10-CM

## 2023-01-24 DIAGNOSIS — J209 Acute bronchitis, unspecified: Secondary | ICD-10-CM | POA: Diagnosis not present

## 2023-01-24 DIAGNOSIS — R059 Cough, unspecified: Secondary | ICD-10-CM | POA: Diagnosis not present

## 2023-01-24 MED ORDER — AMOXICILLIN-POT CLAVULANATE 875-125 MG PO TABS: 1.0000 | ORAL_TABLET | Freq: Two times a day (BID) | ORAL | 0 refills | Status: DC

## 2023-01-24 NOTE — ED Triage Notes (Signed)
Patient states for about 5-6 days he has been dealing with fever, sore throat , right eye irritation, cough, congestion, and ear ache (right).   Home interventions: tylenol, motrin, alka seltzer plus

## 2023-01-24 NOTE — ED Provider Notes (Signed)
[facility]    CSN: [id number] Arrival date & time: 01/24/23  1345      History   Chief Complaint No chief complaint on file.   HPI Adam CanterburyRichard Khokhar Jr. is a 37 y.o. male  presents for evaluation of URI symptoms for 5-6 days. Patient reports associated symptoms of cough, congestion, sore throat, ear pain, fevers of 100 degrees. Denies N/V/D, body aches, shortness of breath. Patient does vape.  He has has a history of asthma as a child and states he outgrew it.  Father has pneumonia currently.  Pt has taken Tylenol/ibuprofen/cough medicine OTC for symptoms. Pt has no other concerns at this time.   HPI  Past Medical History:  Diagnosis Date   Anxiety    Bipolar 1 disorder    Depression    Mania    Suicidal ideations     Patient Active Problem List   Diagnosis Date Noted   Moderate bipolar I disorder, most recent episode depressed    MDD (major depressive disorder) 05/17/2020   Bipolar disorder 04/20/2020   Bipolar 2 disorder 01/09/2019   GAD (generalized anxiety disorder) 01/09/2019   ADHD, predominantly inattentive type 01/09/2019    Past Surgical History:  Procedure Laterality Date   WISDOM TOOTH EXTRACTION         Home Medications    Prior to Admission medications   Medication Sig Start Date End Date Taking? Authorizing Provider  amoxicillin-clavulanate (AUGMENTIN) 875-125 MG tablet Take 1 tablet by mouth every 12 (twelve) hours. 01/28/23  Yes Radford PaxMayer, Jodi R, NP  amitriptyline (ELAVIL) 50 MG tablet Take 1 tablet (50 mg total) by mouth at bedtime. 12/13/22   Mozingo, Thereasa Soloegina Nattalie, NP  Ascorbic Acid (VITAMIN C) 1000 MG tablet Take 2,000 mg by mouth daily.    [provider]  buPROPion (WELLBUTRIN XL) 300 MG 24 hr tablet Take 1 tablet (300 mg total) by mouth daily. 10/29/22 04/27/23  Mozingo, Thereasa Soloegina Nattalie, NP  cholecalciferol (VITAMIN D3) 25 MCG (1000 UNIT) tablet Take 2,000 Units by mouth daily.    [provider]  hydrOXYzine (ATARAX) 25  MG tablet Take 0.5-1 tablets (12.5-25 mg total) by mouth every 8 (eight) hours as needed for itching. 12/25/22   Wallis BambergMani, Mario, PA-C  loratadine (CLARITIN) 10 MG tablet Take 1 tablet (10 mg total) by mouth daily. 05/29/20 06/28/20  Dagar, Geralynn RileAnjali, MD  methylphenidate (CONCERTA) 36 MG PO CR tablet Take one tablet in the morning and one tablet at lunch. 01/14/23   Mozingo, Thereasa Soloegina Nattalie, NP  Multiple Vitamin (MULTIVITAMIN) capsule Take 1 capsule by mouth daily.    [provider]  omega-3 acid ethyl esters (LOVAZA) 1 g capsule Take 1 capsule (1 g total) by mouth 2 (two) times daily. 05/28/20 06/27/20  Dagar, Geralynn RileAnjali, MD  Omega-3 Fatty Acids (FISH OIL) 1000 MG CAPS Take 1,000 mg by mouth daily.    [provider]  perphenazine (TRILAFON) 2 MG tablet Take 1 tablet (2 mg) by mouth with a 4 mg tablet twice a day (total of 6 mg bid) 10/29/22   Mozingo, Thereasa Soloegina Nattalie, NP  perphenazine (TRILAFON) 4 MG tablet Take 1 tablet (4 mg) by mouth with a 2 mg tablet twice a day. (6 mg bid) 10/29/22   Mozingo, Thereasa Soloegina Nattalie, NP    Family History Family History  Problem Relation Age of Onset   Schizophrenia Paternal Grandmother     Social History Social History   Tobacco Use   Smoking status: Every Day  Types: E-cigarettes   Smokeless tobacco: Never  Vaping Use   Vaping Use: Every day   Substances: Nicotine, Flavoring, Nicotine-salt  Substance Use Topics   Alcohol use: Never   Drug use: Never     Allergies   Patient has no known allergies.   Review of Systems Review of Systems  Constitutional:  Positive for fever.  HENT:  Positive for congestion, ear pain and sore throat.   Respiratory:  Positive for cough.      Physical Exam Triage Vital Signs ED Triage Vitals  Enc Vitals Group     BP 01/24/23 1509 (!) 113/8     Pulse Rate 01/24/23 1509 (!) 108     Resp 01/24/23 1509 18     Temp 01/24/23 1509 98 F (36.7 C)     Temp Source 01/24/23 1509 Oral     SpO2 01/24/23 1509 96 %      Weight --      Height --      Head Circumference --      Peak Flow --      Pain Score 01/24/23 1508 2     Pain Loc --      Pain Edu? --      Excl. in GC? --    No data found.  Updated Vital Signs BP 113/80   Pulse (!) 108   Temp 98 F (36.7 C) (Oral)   Resp 18   SpO2 96%   Visual Acuity Right Eye Distance:   Left Eye Distance:   Bilateral Distance:    Right Eye Near:   Left Eye Near:    Bilateral Near:     Physical Exam Vitals and nursing note reviewed.  Constitutional:      General: He is not in acute distress.    Appearance: Normal appearance. He is not ill-appearing or toxic-appearing.  HENT:     Head: Normocephalic and atraumatic.     Right Ear: Tympanic membrane and ear canal normal.     Left Ear: Tympanic membrane and ear canal normal.     Nose: Congestion present.     Mouth/Throat:     Mouth: Mucous membranes are moist.     Pharynx: Posterior oropharyngeal erythema present.  Eyes:     Pupils: Pupils are equal, round, and reactive to light.  Cardiovascular:     Rate and Rhythm: Normal rate and regular rhythm.     Heart sounds: Normal heart sounds.  Pulmonary:     Effort: Pulmonary effort is normal.     Breath sounds: Normal breath sounds.  Musculoskeletal:     Cervical back: Normal range of motion and neck supple.  Lymphadenopathy:     Cervical: No cervical adenopathy.  Skin:    General: Skin is warm and dry.  Neurological:     General: No focal deficit present.     Mental Status: He is alert and oriented to person, place, and time.  Psychiatric:        Mood and Affect: Mood normal.        Behavior: Behavior normal.      UC Treatments / Results  Labs (all labs ordered are listed, but only abnormal results are displayed) Labs Reviewed - No data to display  EKG   Radiology DG Chest 2 View  Result Date: 01/24/2023 CLINICAL DATA:  cough Maia Plan/fever EXAM: CHEST - 2 VIEW COMPARISON:  04/19/2020 FINDINGS: The heart size and mediastinal contours are  within normal limits. Both lungs are clear. The  visualized skeletal structures are unremarkable. IMPRESSION: No active cardiopulmonary disease. Electronically Signed   By: Judie Petit.  Shick M.D.   On: 01/24/2023 15:45    Procedures Procedures (including critical care time)  Medications Ordered in UC Medications - No data to display  Initial Impression / Assessment and Plan / UC Course  I have reviewed the triage vital signs and the nursing notes.  Pertinent labs & imaging results that were available during my care of the patient were reviewed by me and considered in my medical decision making (see chart for details).     Reviewed exam and symptoms with patient.  No red flags. Chest x-ray negative for pneumonia.  Discussed viral illness and symptomatic treatment.  Given length of symptoms provisional prescription provided for Augmentin.  Patient structured not to take unless symptoms do not improve or worsen by 4 9 and he verbalized understanding He declined Rx Tessalon will use OTC cough medicine PCP follow-up if symptoms do not improve ER precautions reviewed and the patient verbalized understanding Final Clinical Impressions(s) / UC Diagnoses   Final diagnoses:  Acute cough  Acute bronchitis, unspecified organism     Discharge Instructions      Your chest x-ray was negative for pneumonia.  A provisional prescription for Augmentin has been provided.  Please do not take this unless your symptoms do not improve or worsen by April 9. Continue over-the-counter cough medicine as needed Rest and fluids Follow-up with your PCP if your symptoms do not improve Please go to emergency room for any worsening symptoms     ED Prescriptions     Medication Sig Dispense Auth. Provider   amoxicillin-clavulanate (AUGMENTIN) 875-125 MG tablet Take 1 tablet by mouth every 12 (twelve) hours. 14 tablet Radford Pax, NP      PDMP not reviewed this encounter.   Radford Pax, NP 01/24/23 [phone]

## 2023-01-24 NOTE — Discharge Instructions (Signed)
Your chest x-ray was negative for pneumonia.  A provisional prescription for Augmentin has been provided.  Please do not take this unless your symptoms do not improve or worsen by April 9. Continue over-the-counter cough medicine as needed Rest and fluids Follow-up with your PCP if your symptoms do not improve Please go to emergency room for any worsening symptoms

## 2023-01-27 ENCOUNTER — Ambulatory Visit: Payer: 59 | Admitting: Mental Health

## 2023-02-10 ENCOUNTER — Encounter: Payer: Self-pay | Admitting: Adult Health

## 2023-02-10 ENCOUNTER — Ambulatory Visit (INDEPENDENT_AMBULATORY_CARE_PROVIDER_SITE_OTHER): Payer: 59 | Admitting: Adult Health

## 2023-02-10 DIAGNOSIS — F331 Major depressive disorder, recurrent, moderate: Secondary | ICD-10-CM | POA: Diagnosis not present

## 2023-02-10 DIAGNOSIS — G4700 Insomnia, unspecified: Secondary | ICD-10-CM

## 2023-02-10 DIAGNOSIS — F411 Generalized anxiety disorder: Secondary | ICD-10-CM | POA: Diagnosis not present

## 2023-02-10 DIAGNOSIS — F900 Attention-deficit hyperactivity disorder, predominantly inattentive type: Secondary | ICD-10-CM | POA: Diagnosis not present

## 2023-02-10 DIAGNOSIS — F22 Delusional disorders: Secondary | ICD-10-CM | POA: Diagnosis not present

## 2023-02-10 MED ORDER — AMITRIPTYLINE HCL 50 MG PO TABS: 50.0000 mg | ORAL_TABLET | Freq: Every day | ORAL | 2 refills | Status: DC

## 2023-02-10 MED ORDER — METHYLPHENIDATE HCL ER (OSM) 36 MG PO TBCR: EXTENDED_RELEASE_TABLET | ORAL | 0 refills | Status: DC

## 2023-02-10 NOTE — Progress Notes (Signed)
Adam Larsen [id number] November 04, 1985 37 y.o.  Subjective:   Patient ID:  Adam Mccormac. is a 37 y.o. (DOB July 10, 1986) male.  Chief Complaint: No chief complaint on file.   HPI Adam Alper. presents to the office today for follow-up of MDD, GAD, insomnia, paranoia, and ADHD.  Describes mood today as "ok". Pleasant. Denies tearfulness. Mood symptoms - denies depression, irritability, and anxiety. Mood is consistent. Stating "I think I'm in a pretty good place". Feels like medications work well. Seeing Adam Larsen for therapy. Improved interest and motivation. Taking medications as prescribed.  Energy levels stable. Active, has a regular exercise routine. Walking and riding bike. Enjoys some usual interests and activities. Single. Lives with parents. Spending time with family.  Appetite adequate. Weight stable 240 to 250 pounds. Sleeps well most nights. Averages 7 to 8 hours a night. Focus and concentration improved. Diagnosed with ADHD in childhood. Completing tasks around the house. Signing up for school - certificate program. Denies SI or HI.   Denies AH or VH.  Denies paranoia. Denies self harm. Denies substance use.  Previous medication: [name]   [id number]    Flowsheet Row ED from 05/16/2020 in [facility] Emergency Department at [facility]  PHQ-2 Total Score 6  PHQ-9 Total Score 17      Flowsheet Row ED from 01/24/2023 in [facility] ED from 12/25/2022 in [facility] at [facility]) ED from 04/19/2020 in [facility] Emergency Department at [facility]  C-SSRS RISK CATEGORY No Risk No Risk Error: Q2 is Yes, you must answer 3, 4, and 5        Review of Systems:  Review of Systems  Musculoskeletal:  Negative for gait problem.  Neurological:  Negative for tremors.  Psychiatric/Behavioral:         Please refer to HPI    Medications: I have reviewed the patient's  current medications.  Current Outpatient Medications  Medication Sig Dispense Refill   amitriptyline (ELAVIL) 50 MG tablet Take 1 tablet (50 mg total) by mouth at bedtime. 30 tablet 2   amoxicillin-clavulanate (AUGMENTIN) 875-125 MG tablet Take 1 tablet by mouth every 12 (twelve) hours. 14 tablet 0   Ascorbic Acid (VITAMIN C) 1000 MG tablet Take 2,000 mg by mouth daily.     buPROPion (WELLBUTRIN XL) 300 MG 24 hr tablet Take 1 tablet (300 mg total) by mouth daily. 30 tablet 5   cholecalciferol (VITAMIN D3) 25 MCG (1000 UNIT) tablet Take 2,000 Units by mouth daily.     hydrOXYzine (ATARAX) 25 MG tablet Take 0.5-1 tablets (12.5-25 mg total) by mouth every 8 (eight) hours as needed for itching. 30 tablet 0   loratadine (CLARITIN) 10 MG tablet Take 1 tablet (10 mg total) by mouth daily. 30 tablet 0   methylphenidate (CONCERTA) 36 MG PO CR tablet Take one tablet in the morning and one tablet at lunch. 60 tablet 0   Multiple Vitamin (MULTIVITAMIN) capsule Take 1 capsule by mouth daily.     omega-3 acid ethyl esters (LOVAZA) 1 g capsule Take 1 capsule (1 g total) by mouth 2 (two) times daily. 60 capsule 0   Omega-3 Fatty Acids (FISH OIL) 1000 MG CAPS Take 1,000 mg by mouth daily.     perphenazine (TRILAFON) 2 MG tablet Take 1 tablet (2 mg) by mouth with a 4 mg tablet twice a day (total of 6 mg bid) 60 tablet 5   perphenazine (  TRILAFON) 4 MG tablet Take 1 tablet (4 mg) by mouth with a 2 mg tablet twice a day. (6 mg bid) 60 tablet 5   No current facility-administered medications for this visit.    Medication Side Effects: None  Allergies: No Known Allergies  Past Medical History:  Diagnosis Date   Anxiety    Bipolar 1 disorder    Depression    Mania    Suicidal ideations     Past Medical History, Surgical history, Social history, and Family history were reviewed and updated as appropriate.   Please see review of systems for further details on the patient's review from today.   Objective:    Physical Exam:  There were no vitals taken for this visit.  Physical Exam Constitutional:      General: He is not in acute distress. Musculoskeletal:        General: No deformity.  Neurological:     Mental Status: He is alert and oriented to person, place, and time.     Coordination: Coordination normal.  Psychiatric:        Attention and Perception: Attention and perception normal. He does not perceive auditory or visual hallucinations.        Mood and Affect: Mood normal. Mood is not anxious or depressed. Affect is not labile, blunt, angry or inappropriate.        Speech: Speech normal.        Behavior: Behavior normal.        Thought Content: Thought content normal. Thought content is not paranoid or delusional. Thought content does not include homicidal or suicidal ideation. Thought content does not include homicidal or suicidal plan.        Cognition and Memory: Cognition and memory normal.        Judgment: Judgment normal.     Comments: Insight intact     Lab Review:     Component Value Date/Time   NA 141 12/25/2022 1303   K 4.1 12/25/2022 1303   CL 102 12/25/2022 1303   CO2 23 12/25/2022 1303   GLUCOSE 96 12/25/2022 1303   GLUCOSE 202 (H) 05/16/2020 0912   BUN 11 12/25/2022 1303   CREATININE 0.92 12/25/2022 1303   CALCIUM 10.1 12/25/2022 1303   PROT 7.0 12/25/2022 1303   ALBUMIN 4.6 12/25/2022 1303   AST 16 12/25/2022 1303   ALT 31 12/25/2022 1303   ALKPHOS 83 12/25/2022 1303   BILITOT 0.3 12/25/2022 1303   GFRNONAA >60 05/16/2020 0912   GFRAA >60 05/16/2020 0912       Component Value Date/Time   WBC 9.2 12/25/2022 1303   WBC 12.5 (H) 05/16/2020 0912   RBC 4.83 12/25/2022 1303   RBC 5.12 05/16/2020 0912   HGB 15.4 12/25/2022 1303   HCT 44.4 12/25/2022 1303   PLT 336 12/25/2022 1303   MCV 92 12/25/2022 1303   MCH 31.9 12/25/2022 1303   MCH 29.9 05/16/2020 0912   MCHC 34.7 12/25/2022 1303   MCHC 33.6 05/16/2020 0912   RDW 12.4 12/25/2022 1303    LYMPHSABS 3.3 (H) 12/25/2022 1303   MONOABS 0.8 04/19/2020 1252   EOSABS 0.1 12/25/2022 1303   BASOSABS 0.1 12/25/2022 1303    No results found for: "POCLITH", "LITHIUM"   No results found for: "PHENYTOIN", "PHENOBARB", "VALPROATE", "CBMZ"   .res Assessment: Plan:    Plan:  1. Perphenazine  in am and  at bedtime   2. Wellbutrin XL  daily 3. Amitriptyline  at hs - one  tablet at bedtime. 4. Concerta 36mg  twice daily. Gave one paper copy  Monitor BP between visits while taking stimulant medication.   RTC 4 weeks  Discussed potential benefits, risks, and side effects of stimulants with patient to include increased heart rate, palpitations, insomnia, increased anxiety, increased irritability, or decreased appetite. Also discussed history of paranoia and to watch for any change in moods. Will also discuss changes with parents. Instructed patient to contact office if experiencing any significant tolerability issues.   Patient advised to contact office with any questions, adverse effects, or acute worsening in signs and symptoms.  Discussed potential metabolic side effects associated with atypical antipsychotics, as well as potential risk for movement side effects. Advised pt to contact office if movement side effects occur.     Diagnoses and all orders for this visit:  Major depressive disorder, recurrent episode, moderate  ADHD, predominantly inattentive type -     methylphenidate (CONCERTA) 36 MG PO CR tablet; Take one tablet in the morning and one tablet at lunch.  Insomnia, unspecified type -     amitriptyline (ELAVIL) 50 MG tablet; Take 1 tablet (50 mg total) by mouth at bedtime.  GAD (generalized anxiety disorder)  Paranoia     Please see After Visit Summary for patient specific instructions.  Future Appointments  Date Time Provider Department Center  02/11/2023  9:00 AM [name] Session, [facility] None  02/24/2023  8:00 AM [name] Session,  [facility] CP-CP None  03/11/2023  8:00 AM [name] Session, [facility] CP-CP None  03/24/2023 10:00 AM Georgina Quint, MD [facility] None    No orders of the defined types were placed in this encounter.   -------------------------------

## 2023-02-11 ENCOUNTER — Ambulatory Visit (INDEPENDENT_AMBULATORY_CARE_PROVIDER_SITE_OTHER): Payer: 59 | Admitting: Mental Health

## 2023-02-11 DIAGNOSIS — F331 Major depressive disorder, recurrent, moderate: Secondary | ICD-10-CM | POA: Diagnosis not present

## 2023-02-11 NOTE — Progress Notes (Signed)
[facility] Note  Name: Adam Larsen. Date:  02/11/23 MRN: [id number]   DOB: [birth year] PCP: Patient, No Pcp Per  Time spent: 54 minutes  Treatment:  Individual therapy  Mental Status Exam:    Appearance:   Casual     Behavior:  Appropriate  Motor:  Normal  Speech/Language:   Clear and Coherent  Affect:  full range  Mood:  Anxious, pleasant  Thought process:  normal  Thought content:    WNL  Sensory/Perceptual disturbances:    none  Orientation:  x4  Attention:  Good  Concentration:  Good  Memory:  WNL  Fund of knowledge:   Good  Insight:    Good  Judgment:   Good  Impulse Control:  Good   Reported Symptoms:  Sleep problems-wakes in the middle of the night, low motivation, isolative, intermittent depressed mood, anxiety, struggles with daily hygiene (not showering, brushing teeth etc), self depreciating thoughts  Risk Assessment: Danger to Self:  No Self-injurious Behavior: No Danger to Others: No Duty to Warn:no Physical Aggression / Violence:No  Access to Firearms a concern: No  Gang Involvement:No  Patient / guardian was educated about steps to take if suicide or homicide risk level increases between visits: yes While future psychiatric events cannot be accurately predicted, the patient does not currently require acute inpatient psychiatric care and does not currently meet [facility] involuntary commitment criteria.    Medications: Current Outpatient Medications  Medication Sig Dispense Refill   amitriptyline (ELAVIL) 50 MG tablet Take 1 tablet (50 mg total) by mouth at bedtime. 30 tablet 2   amoxicillin-clavulanate (AUGMENTIN) 875-125 MG tablet Take 1 tablet by mouth every 12 (twelve) hours. 14 tablet 0   Ascorbic Acid (VITAMIN C) 1000 MG tablet Take 2,000 mg by mouth daily.     buPROPion (WELLBUTRIN XL) 300 MG 24 hr tablet Take 1 tablet (300 mg total) by mouth daily. 30 tablet 5   cholecalciferol (VITAMIN D3) 25 MCG (1000 UNIT) tablet Take  2,000 Units by mouth daily.     hydrOXYzine (ATARAX) 25 MG tablet Take 0.5-1 tablets (12.5-25 mg total) by mouth every 8 (eight) hours as needed for itching. 30 tablet 0   loratadine (CLARITIN) 10 MG tablet Take 1 tablet (10 mg total) by mouth daily. 30 tablet 0   methylphenidate (CONCERTA) 36 MG PO CR tablet Take one tablet in the morning and one tablet at lunch. 60 tablet 0   Multiple Vitamin (MULTIVITAMIN) capsule Take 1 capsule by mouth daily.     omega-3 acid ethyl esters (LOVAZA) 1 g capsule Take 1 capsule (1 g total) by mouth 2 (two) times daily. 60 capsule 0   Omega-3 Fatty Acids (FISH OIL) 1000 MG CAPS Take 1,000 mg by mouth daily.     perphenazine (TRILAFON) 2 MG tablet Take 1 tablet (2 mg) by mouth with a 4 mg tablet twice a day (total of 6 mg bid) 60 tablet 5   perphenazine (TRILAFON) 4 MG tablet Take 1 tablet (4 mg) by mouth with a 2 mg tablet twice a day. (6 mg bid) 60 tablet 5   No current facility-administered medications for this visit.    Subjective: Patient presents for session on time.  Patient shared recent progress. He stated then his brother is now seeing a life coach. He went on to share how he hopes this will be a positive experience where his brother can find employment as he has been unemployed for the last couple of years. He  said that he had a discussion with his brother recently where he wanted them to try and spend more time together, however, he said his brother said he was too busy currently. Patient feels this was just a level of avoidance and was disappointed. He stated that he is planning on registering for a computer course tomorrow. He plans to get a certification and identified the need to set up consistent structure daily and a routine. This was explored collaborally in session where he plans to follow through with being consistent daily. Identified potential positives out of taking these steps for himself. He said he also had a recent discussion with his father  regarding their relationship or patient said he was able to let his father know he's willing to talk to him especially through difficult issues but he asked his father to not react so defensively as he does not want them to devolve into arguments.    A Interventions:   supportive therapy, problem solving strategies  Diagnoses:    ICD-10-CM   1. Major depressive disorder, recurrent episode, moderate  F33.1          Plan: Patient is to use CBT, mindfulness and coping skills to help decrease symptoms associated with their diagnosis.  Patient to look into employment options as well as college courses to eventually obtain more independence.    Long-term goal:   Reduce overall level, frequency, and intensity of the feelings of depression and anxiety 7-8/10 to a 0-2/10 in severity for at least 3 consecutive months.   Short-term goal:  Decrease anxiety producing self talk such as thinking of the worse possible life outcome Decrease ruminating, which can affect his sleep and increased emotional distress Continue to take medications as prescribed Increased self-confidence by engaging in an exercise regimen, working out Increase steps toward independence by finding employment and eventually moving out of this parent's home    Assessment of progress:  progressing  [name] Session, [facility]

## 2023-02-24 ENCOUNTER — Ambulatory Visit: Payer: 59 | Admitting: Mental Health

## 2023-03-10 ENCOUNTER — Ambulatory Visit (INDEPENDENT_AMBULATORY_CARE_PROVIDER_SITE_OTHER): Payer: 59 | Admitting: Adult Health

## 2023-03-10 ENCOUNTER — Encounter: Payer: Self-pay | Admitting: Adult Health

## 2023-03-10 DIAGNOSIS — F331 Major depressive disorder, recurrent, moderate: Secondary | ICD-10-CM | POA: Insufficient documentation

## 2023-03-10 DIAGNOSIS — F411 Generalized anxiety disorder: Secondary | ICD-10-CM

## 2023-03-10 DIAGNOSIS — F900 Attention-deficit hyperactivity disorder, predominantly inattentive type: Secondary | ICD-10-CM | POA: Diagnosis not present

## 2023-03-10 DIAGNOSIS — F22 Delusional disorders: Secondary | ICD-10-CM | POA: Diagnosis not present

## 2023-03-10 DIAGNOSIS — G4700 Insomnia, unspecified: Secondary | ICD-10-CM | POA: Diagnosis not present

## 2023-03-10 MED ORDER — METHYLPHENIDATE HCL ER (OSM) 36 MG PO TBCR: EXTENDED_RELEASE_TABLET | ORAL | 0 refills | Status: DC

## 2023-03-10 MED ORDER — AMITRIPTYLINE HCL 50 MG PO TABS: 50.0000 mg | ORAL_TABLET | Freq: Every day | ORAL | 2 refills | Status: DC

## 2023-03-10 MED ORDER — PERPHENAZINE 2 MG PO TABS: ORAL_TABLET | ORAL | 5 refills | Status: DC

## 2023-03-10 MED ORDER — PERPHENAZINE 4 MG PO TABS: ORAL_TABLET | ORAL | 5 refills | Status: DC

## 2023-03-10 MED ORDER — BUPROPION HCL ER (XL) 300 MG PO TB24: 300.0000 mg | ORAL_TABLET | Freq: Every day | ORAL | 5 refills | Status: DC

## 2023-03-10 NOTE — Progress Notes (Addendum)
[name] [id number] 1985-11-18 37 y.o.  Subjective:   Patient ID:  Adam Larsen. is a 37 y.o. (DOB Oct 18, 1986) male.  Chief Complaint: No chief complaint on file.   HPI Adam Larsen. presents to the office today for follow-up of MDD, GAD, insomnia, paranoia, and ADHD.  Describes mood today as "ok". Pleasant. Denies tearfulness. Mood symptoms - denies depression, irritability, and anxiety. Mood has been lower. Stating "I'm dealing with environmental things". Reports stopping the Amitriptyline a few weeks ago - plans to restart. Stating "I didn't feel like I needed it". Feels like other medications are helpful. Seeing [name] for therapy. Improved interest and motivation. Taking medications as prescribed.  Energy levels stable. Active, has a regular exercise routine. Walking and riding bike. Enjoys some usual interests and activities. Single. Lives with parents. Spending time with family.  Appetite adequate. Weight loss - 215 pounds. Sleeps well most nights. Averages 7 to 8 hours a night. Focus and concentration improved. Diagnosed with ADHD in childhood. Completing tasks around the house. Has put school on hold for now. Denies SI or HI.   Denies AH or VH.  Denies paranoia. Denies self harm. Denies substance use.  Previous medication: [name]   [id number]    Flowsheet Row ED from 05/16/2020 in [facility] Emergency Department at [facility]  PHQ-2 Total Score 6  PHQ-9 Total Score 17      Flowsheet Row ED from 01/24/2023 in [facility] at [facility]) ED from 12/25/2022 in [facility] at [facility]) ED from 04/19/2020 in [facility] Emergency Department at [facility]  C-SSRS RISK CATEGORY No Risk No Risk Error: Q2 is Yes, you must answer 3, 4, and 5        Review of Systems:  Review of Systems  Musculoskeletal:  Negative for gait problem.  Neurological:  Negative for  tremors.  Psychiatric/Behavioral:         Please refer to HPI    Medications: I have reviewed the patient's current medications.  Current Outpatient Medications  Medication Sig Dispense Refill   amitriptyline (ELAVIL) 50 MG tablet Take 1 tablet (50 mg total) by mouth at bedtime. 30 tablet 2   amoxicillin-clavulanate (AUGMENTIN) 875-125 MG tablet Take 1 tablet by mouth every 12 (twelve) hours. 14 tablet 0   Ascorbic Acid (VITAMIN C) 1000 MG tablet Take 2,000 mg by mouth daily.     buPROPion (WELLBUTRIN XL) 300 MG 24 hr tablet Take 1 tablet (300 mg total) by mouth daily. 30 tablet 5   cholecalciferol (VITAMIN D3) 25 MCG (1000 UNIT) tablet Take 2,000 Units by mouth daily.     hydrOXYzine (ATARAX) 25 MG tablet Take 0.5-1 tablets (12.5-25 mg total) by mouth every 8 (eight) hours as needed for itching. 30 tablet 0   loratadine (CLARITIN) 10 MG tablet Take 1 tablet (10 mg total) by mouth daily. 30 tablet 0   methylphenidate (CONCERTA) 36 MG PO CR tablet Take one tablet in the morning and one tablet at lunch. 60 tablet 0   Multiple Vitamin (MULTIVITAMIN) capsule Take 1 capsule by mouth daily.     omega-3 acid ethyl esters (LOVAZA) 1 g capsule Take 1 capsule (1 g total) by mouth 2 (two) times daily. 60 capsule 0   Omega-3 Fatty Acids (FISH OIL) 1000 MG CAPS Take 1,000 mg by mouth daily.     perphenazine (TRILAFON) 2 MG tablet Take 1 tablet (2 mg) by mouth with  a 4 mg tablet twice a day (total of 6 mg bid) 60 tablet 5   perphenazine (TRILAFON) 4 MG tablet Take 1 tablet (4 mg) by mouth with a 2 mg tablet twice a day. (6 mg bid) 60 tablet 5   No current facility-administered medications for this visit.    Medication Side Effects: None  Allergies: No Known Allergies  Past Medical History:  Diagnosis Date   Anxiety    Bipolar 1 disorder (HCC)    Depression    Mania (HCC)    Suicidal ideations     Past Medical History, Surgical history, Social history, and Family history were reviewed and  updated as appropriate.   Please see review of systems for further details on the patient's review from today.   Objective:   Physical Exam:  There were no vitals taken for this visit.  Physical Exam Constitutional:      General: He is not in acute distress. Musculoskeletal:        General: No deformity.  Neurological:     Mental Status: He is alert and oriented to person, place, and time.     Coordination: Coordination normal.  Psychiatric:        Attention and Perception: Attention and perception normal. He does not perceive auditory or visual hallucinations.        Mood and Affect: Mood normal. Mood is not anxious or depressed. Affect is not labile, blunt, angry or inappropriate.        Speech: Speech normal.        Behavior: Behavior normal.        Thought Content: Thought content normal. Thought content is not paranoid or delusional. Thought content does not include homicidal or suicidal ideation. Thought content does not include homicidal or suicidal plan.        Cognition and Memory: Cognition and memory normal.        Judgment: Judgment normal.     Comments: Insight intact     Lab Review:     Component Value Date/Time   NA 141 12/25/2022 1303   K 4.1 12/25/2022 1303   CL 102 12/25/2022 1303   CO2 23 12/25/2022 1303   GLUCOSE 96 12/25/2022 1303   GLUCOSE 202 (H) 05/16/2020 0912   BUN 11 12/25/2022 1303   CREATININE 0.92 12/25/2022 1303   CALCIUM 10.1 12/25/2022 1303   PROT 7.0 12/25/2022 1303   ALBUMIN 4.6 12/25/2022 1303   AST 16 12/25/2022 1303   ALT 31 12/25/2022 1303   ALKPHOS 83 12/25/2022 1303   BILITOT 0.3 12/25/2022 1303   GFRNONAA >60 05/16/2020 0912   GFRAA >60 05/16/2020 0912       Component Value Date/Time   WBC 9.2 12/25/2022 1303   WBC 12.5 (H) 05/16/2020 0912   RBC 4.83 12/25/2022 1303   RBC 5.12 05/16/2020 0912   HGB 15.4 12/25/2022 1303   HCT 44.4 12/25/2022 1303   PLT 336 12/25/2022 1303   MCV 92 12/25/2022 1303   MCH 31.9  12/25/2022 1303   MCH 29.9 05/16/2020 0912   MCHC 34.7 12/25/2022 1303   MCHC 33.6 05/16/2020 0912   RDW 12.4 12/25/2022 1303   LYMPHSABS 3.3 (H) 12/25/2022 1303   MONOABS 0.8 04/19/2020 1252   EOSABS 0.1 12/25/2022 1303   BASOSABS 0.1 12/25/2022 1303    No results found for: "POCLITH", "LITHIUM"   No results found for: "PHENYTOIN", "PHENOBARB", "VALPROATE", "CBMZ"   .res Assessment: Plan:    Plan:  Perphenazine 6mg  in  am and 6mg  at bedtime   Wellbutrin XL 300mg  daily Concerta 36mg  twice daily. Gave one paper copy  Restart Amitriptyline 50mg  at hs - one tablet at bedtime.  Monitor BP between visits while taking stimulant medication.   RTC 4 weeks  Discussed potential benefits, risks, and side effects of stimulants with patient to include increased heart rate, palpitations, insomnia, increased anxiety, increased irritability, or decreased appetite. Also discussed history of paranoia and to watch for any change in moods. Will also discuss changes with parents. Instructed patient to contact office if experiencing any significant tolerability issues.   Patient advised to contact office with any questions, adverse effects, or acute worsening in signs and symptoms.  Discussed potential metabolic side effects associated with atypical antipsychotics, as well as potential risk for movement side effects. Advised pt to contact office if movement side effects occur.    Diagnoses and all orders for this visit:  Major depressive disorder, recurrent episode, moderate (HCC) -     buPROPion (WELLBUTRIN XL) 300 MG 24 hr tablet; Take 1 tablet (300 mg total) by mouth daily.  ADHD, predominantly inattentive type -     methylphenidate (CONCERTA) 36 MG PO CR tablet; Take one tablet in the morning and one tablet at lunch.  GAD (generalized anxiety disorder)  Insomnia, unspecified type -     amitriptyline (ELAVIL) 50 MG tablet; Take 1 tablet (50 mg total) by mouth at bedtime.  Major  depressive disorder, recurrent episode, moderate degree (HCC) -     perphenazine (TRILAFON) 2 MG tablet; Take 1 tablet (2 mg) by mouth with a 4 mg tablet twice a day (total of 6 mg bid) -     perphenazine (TRILAFON) 4 MG tablet; Take 1 tablet (4 mg) by mouth with a 2 mg tablet twice a day. (6 mg bid)  Paranoia (HCC)     Please see After Visit Summary for patient specific instructions.  Future Appointments  Date Time Provider Department Center  03/24/2023 10:00 AM Georgina Quint, MD [facility] None  03/26/2023  8:00 AM [name] Session, [facility] CP-CP None  04/07/2023  8:00 AM Rahi Chandonnet, Thereasa Solo, NP CP-CP None    No orders of the defined types were placed in this encounter.   -------------------------------

## 2023-03-11 ENCOUNTER — Ambulatory Visit: Payer: 59 | Admitting: Mental Health

## 2023-03-24 ENCOUNTER — Ambulatory Visit: Payer: 59 | Admitting: Emergency Medicine

## 2023-03-24 ENCOUNTER — Encounter: Payer: Self-pay | Admitting: Emergency Medicine

## 2023-03-24 VITALS — BP 120/74 | HR 82 | Temp 97.9°F | Ht 71.0 in | Wt 223.4 lb

## 2023-03-24 DIAGNOSIS — Z1329 Encounter for screening for other suspected endocrine disorder: Secondary | ICD-10-CM | POA: Diagnosis not present

## 2023-03-24 DIAGNOSIS — R21 Rash and other nonspecific skin eruption: Secondary | ICD-10-CM

## 2023-03-24 DIAGNOSIS — F3130 Bipolar disorder, current episode depressed, mild or moderate severity, unspecified: Secondary | ICD-10-CM | POA: Diagnosis not present

## 2023-03-24 DIAGNOSIS — Z13220 Encounter for screening for lipoid disorders: Secondary | ICD-10-CM

## 2023-03-24 DIAGNOSIS — M7989 Other specified soft tissue disorders: Secondary | ICD-10-CM

## 2023-03-24 DIAGNOSIS — Z1159 Encounter for screening for other viral diseases: Secondary | ICD-10-CM | POA: Diagnosis not present

## 2023-03-24 DIAGNOSIS — Z13228 Encounter for screening for other metabolic disorders: Secondary | ICD-10-CM

## 2023-03-24 DIAGNOSIS — Z130 Encounter for screening for diseases of the blood and blood-forming organs and certain disorders involving the immune mechanism: Secondary | ICD-10-CM

## 2023-03-24 DIAGNOSIS — Z7689 Persons encountering health services in other specified circumstances: Secondary | ICD-10-CM

## 2023-03-24 DIAGNOSIS — Z0000 Encounter for general adult medical examination without abnormal findings: Secondary | ICD-10-CM | POA: Diagnosis not present

## 2023-03-24 DIAGNOSIS — F411 Generalized anxiety disorder: Secondary | ICD-10-CM | POA: Diagnosis not present

## 2023-03-24 DIAGNOSIS — F17200 Nicotine dependence, unspecified, uncomplicated: Secondary | ICD-10-CM | POA: Diagnosis not present

## 2023-03-24 LAB — COMPREHENSIVE METABOLIC PANEL
ALT: 30 U/L (ref 0–53)
AST: 20 U/L (ref 0–37)
Albumin: 4.7 g/dL (ref 3.5–5.2)
Alkaline Phosphatase: 66 U/L (ref 39–117)
BUN: 14 mg/dL (ref 6–23)
CO2: 27 mEq/L (ref 19–32)
Calcium: 9.8 mg/dL (ref 8.4–10.5)
Chloride: 101 mEq/L (ref 96–112)
Creatinine, Ser: 1.1 mg/dL (ref 0.40–1.50)
GFR: 86.81 mL/min (ref >60.00)
Glucose, Bld: 95 mg/dL (ref 70–99)
Potassium: 4 mEq/L (ref 3.5–5.1)
Sodium: 139 mEq/L (ref 135–145)
Total Bilirubin: 0.4 mg/dL (ref 0.2–1.2)
Total Protein: 7.6 g/dL (ref 6.0–8.3)

## 2023-03-24 LAB — LIPID PANEL
Cholesterol: 158 mg/dL (ref 0–200)
HDL: 37.5 mg/dL — ABNORMAL LOW (ref >39.00)
LDL Cholesterol: 80 mg/dL (ref 0–99)
NonHDL: 120.07
Total CHOL/HDL Ratio: 4
Triglycerides: 198 mg/dL — ABNORMAL HIGH (ref 0.0–149.0)
VLDL: 39.6 mg/dL (ref 0.0–40.0)

## 2023-03-24 LAB — CBC WITH DIFFERENTIAL/PLATELET
Basophils Absolute: 0.1 10*3/uL (ref 0.0–0.1)
Basophils Relative: 1.1 % (ref 0.0–3.0)
Eosinophils Absolute: 0.2 10*3/uL (ref 0.0–0.7)
Eosinophils Relative: 1.6 % (ref 0.0–5.0)
HCT: 46.3 % (ref 39.0–52.0)
Hemoglobin: 15.5 g/dL (ref 13.0–17.0)
Lymphocytes Relative: 38.2 % (ref 12.0–46.0)
Lymphs Abs: 4.5 10*3/uL — ABNORMAL HIGH (ref 0.7–4.0)
MCHC: 33.4 g/dL (ref 30.0–36.0)
MCV: 90.8 fl (ref 78.0–100.0)
Monocytes Absolute: 0.7 10*3/uL (ref 0.1–1.0)
Monocytes Relative: 6 % (ref 3.0–12.0)
Neutro Abs: 6.2 10*3/uL (ref 1.4–7.7)
Neutrophils Relative %: 53.1 % (ref 43.0–77.0)
Platelets: 339 10*3/uL (ref 150.0–400.0)
RBC: 5.1 Mil/uL (ref 4.22–5.81)
RDW: 13.1 % (ref 11.5–15.5)
WBC: 11.7 10*3/uL — ABNORMAL HIGH (ref 4.0–10.5)

## 2023-03-24 LAB — HEMOGLOBIN A1C: Hgb A1c MFr Bld: 5.2 % (ref 4.6–6.5)

## 2023-03-24 LAB — TSH: TSH: 1.31 u[IU]/mL (ref 0.35–5.50)

## 2023-03-24 NOTE — Patient Instructions (Signed)
Health Maintenance, Male Adopting a healthy lifestyle and getting preventive care are important in promoting health and wellness. Ask your health care provider about: The right schedule for you to have regular tests and exams. Things you can do on your own to prevent diseases and keep yourself healthy. What should I know about diet, weight, and exercise? Eat a healthy diet  Eat a diet that includes plenty of vegetables, fruits, low-fat dairy products, and lean protein. Do not eat a lot of foods that are high in solid fats, added sugars, or sodium. Maintain a healthy weight Body mass index (BMI) is a measurement that can be used to identify possible weight problems. It estimates body fat based on height and weight. Your health care provider can help determine your BMI and help you achieve or maintain a healthy weight. Get regular exercise Get regular exercise. This is one of the most important things you can do for your health. Most adults should: Exercise for at least 150 minutes each week. The exercise should increase your heart rate and make you sweat (moderate-intensity exercise). Do strengthening exercises at least twice a week. This is in addition to the moderate-intensity exercise. Spend less time sitting. Even light physical activity can be beneficial. Watch cholesterol and blood lipids Have your blood tested for lipids and cholesterol at 36 years of age, then have this test every 5 years. You may need to have your cholesterol levels checked more often if: Your lipid or cholesterol levels are high. You are older than 36 years of age. You are at high risk for heart disease. What should I know about cancer screening? Many types of cancers can be detected early and may often be prevented. Depending on your health history and family history, you may need to have cancer screening at various ages. This may include screening for: Colorectal cancer. Prostate cancer. Skin cancer. Lung  cancer. What should I know about heart disease, diabetes, and high blood pressure? Blood pressure and heart disease High blood pressure causes heart disease and increases the risk of stroke. This is more likely to develop in people who have high blood pressure readings or are overweight. Talk with your health care provider about your target blood pressure readings. Have your blood pressure checked: Every 3-5 years if you are 18-39 years of age. Every year if you are 40 years old or older. If you are between the ages of 65 and 75 and are a current or former smoker, ask your health care provider if you should have a one-time screening for abdominal aortic aneurysm (AAA). Diabetes Have regular diabetes screenings. This checks your fasting blood sugar level. Have the screening done: Once every three years after age 45 if you are at a normal weight and have a low risk for diabetes. More often and at a younger age if you are overweight or have a high risk for diabetes. What should I know about preventing infection? Hepatitis B If you have a higher risk for hepatitis B, you should be screened for this virus. Talk with your health care provider to find out if you are at risk for hepatitis B infection. Hepatitis C Blood testing is recommended for: Everyone born from 1945 through 1965. Anyone with known risk factors for hepatitis C. Sexually transmitted infections (STIs) You should be screened each year for STIs, including gonorrhea and chlamydia, if: You are sexually active and are younger than 36 years of age. You are older than 36 years of age and your   health care provider tells you that you are at risk for this type of infection. Your sexual activity has changed since you were last screened, and you are at increased risk for chlamydia or gonorrhea. Ask your health care provider if you are at risk. Ask your health care provider about whether you are at high risk for HIV. Your health care provider  may recommend a prescription medicine to help prevent HIV infection. If you choose to take medicine to prevent HIV, you should first get tested for HIV. You should then be tested every 3 months for as long as you are taking the medicine. Follow these instructions at home: Alcohol use Do not drink alcohol if your health care provider tells you not to drink. If you drink alcohol: Limit how much you have to 0-2 drinks a day. Know how much alcohol is in your drink. In the [place] one drink equals one 12 oz bottle of beer (355 mL), one 5 oz glass of wine (148 mL), or one 1 oz glass of hard liquor (44 mL). Lifestyle Do not use any products that contain nicotine or tobacco. These products include cigarettes, chewing tobacco, and vaping devices, such as e-cigarettes. If you need help quitting, ask your health care provider. Do not use street drugs. Do not share needles. Ask your health care provider for help if you need support or information about quitting drugs. General instructions Schedule regular health, dental, and eye exams. Stay current with your vaccines. Tell your health care provider if: You often feel depressed. You have ever been abused or do not feel safe at home. Summary Adopting a healthy lifestyle and getting preventive care are important in promoting health and wellness. Follow your health care provider's instructions about healthy diet, exercising, and getting tested or screened for diseases. Follow your health care provider's instructions on monitoring your cholesterol and blood pressure. This information is not intended to replace advice given to you by your health care provider. Make sure you discuss any questions you have with your health care provider. Document Revised: 02/26/2021 Document Reviewed: 02/26/2021 Elsevier Patient Education  2024 Elsevier Inc.  

## 2023-03-24 NOTE — Progress Notes (Signed)
Adam Larsen. 36 y.o.   Chief Complaint  Patient presents with   New Patient (Initial Visit)    Right foot swollen off and on , left foot rash off and on   Patient states sometimes in the am he is SOB     HISTORY OF PRESENT ILLNESS: This is a 37 y.o. male first visit to the office, here to establish care with me Past medical history includes psychiatric conditions.  Sees psychiatrist on a regular basis.  On multiple medications. No chronic medical problems. Has occasional swelling to right food and occasional rash to left foot Likes to vape.  Sometimes gets up in the morning with episodes of shortness of breath lasting 1 to 2 minutes.  Fine during the day.  No history of asthma. No other complaints or medical concerns today.  HPI   Prior to Admission medications   Medication Sig Start Date End Date Taking? Authorizing Provider  Ascorbic Acid (VITAMIN C) 1000 MG tablet Take 2,000 mg by mouth daily.   Yes [provider]  buPROPion (WELLBUTRIN XL) 300 MG 24 hr tablet Take 1 tablet (300 mg total) by mouth daily. 03/10/23 09/06/23 Yes Mozingo, Thereasa Solo, NP  cholecalciferol (VITAMIN D3) 25 MCG (1000 UNIT) tablet Take 2,000 Units by mouth daily.   Yes [provider]  methylphenidate (CONCERTA) 36 MG PO CR tablet Take one tablet in the morning and one tablet at lunch. 03/10/23  Yes Mozingo, Thereasa Solo, NP  Multiple Vitamin (MULTIVITAMIN) capsule Take 1 capsule by mouth daily.   Yes [provider]  Omega-3 Fatty Acids (FISH OIL) 1000 MG CAPS Take 1,000 mg by mouth daily.   Yes [provider]  perphenazine (TRILAFON) 2 MG tablet Take 1 tablet (2 mg) by mouth with a 4 mg tablet twice a day (total of 6 mg bid) 03/10/23  Yes Mozingo, Thereasa Solo, NP  perphenazine (TRILAFON) 4 MG tablet Take 1 tablet (4 mg) by mouth with a 2 mg tablet twice a day. (6 mg bid) 03/10/23  Yes Mozingo, Thereasa Solo, NP  amitriptyline (ELAVIL) 50 MG tablet Take  1 tablet (50 mg total) by mouth at bedtime. Patient not taking: Reported on 03/24/2023 03/10/23   Mozingo, Thereasa Solo, NP  hydrOXYzine (ATARAX) 25 MG tablet Take 0.5-1 tablets (12.5-25 mg total) by mouth every 8 (eight) hours as needed for itching. Patient not taking: Reported on 03/24/2023 12/25/22   Wallis Bamberg, PA-C  loratadine (CLARITIN) 10 MG tablet Take 1 tablet (10 mg total) by mouth daily. 05/29/20 06/28/20  Dagar, Geralynn Rile, MD  omega-3 acid ethyl esters (LOVAZA) 1 g capsule Take 1 capsule (1 g total) by mouth 2 (two) times daily. 05/28/20 06/27/20  Dagar, Geralynn Rile, MD    No Known Allergies  Patient Active Problem List   Diagnosis Date Noted   Major depressive disorder, recurrent episode, moderate (HCC) 03/10/2023   Paranoia (HCC) 03/10/2023   Moderate bipolar I disorder, most recent episode depressed (HCC)    MDD (major depressive disorder) 05/17/2020   Bipolar disorder (HCC) 04/20/2020   Bipolar 2 disorder (HCC) 01/09/2019   GAD (generalized anxiety disorder) 01/09/2019   ADHD, predominantly inattentive type 01/09/2019    Past Medical History:  Diagnosis Date   Anxiety    Bipolar 1 disorder (HCC)    Depression    Mania (HCC)    Suicidal ideations     Past Surgical History:  Procedure Laterality Date   WISDOM TOOTH EXTRACTION      Social History  Socioeconomic History   Marital status: Single    Spouse name: Not on file   Number of children: 0   Years of education: 10 or 52   Highest education level: 11th grade  Occupational History   Not on file  Tobacco Use   Smoking status: Every Day    Types: E-cigarettes   Smokeless tobacco: Never  Vaping Use   Vaping Use: Every day   Substances: Nicotine, Flavoring, Nicotine-salt  Substance and Sexual Activity   Alcohol use: Never   Drug use: Never   Sexual activity: Not Currently  Other Topics Concern   Not on file  Social History Narrative   Pt lives with his parents. States he has been diagnosed with ADHD, dyslexia,  dysgraphia and learning disabilities. Finished 10 th or 11 th grade but is working on his GED through an online high school program. Pt is currently unemployed.   Social Determinants of Health   Financial Resource Strain: Low Risk  (01/09/2019)   Overall Financial Resource Strain (CARDIA)    Difficulty of Paying Living Expenses: Not hard at all  Food Insecurity: No Food Insecurity (01/09/2019)   Hunger Vital Sign    Worried About Running Out of Food in the Last Year: Never true    Ran Out of Food in the Last Year: Never true  Transportation Needs: No Transportation Needs (01/09/2019)   PRAPARE - Administrator, Civil Service (Medical): No    Lack of Transportation (Non-Medical): No  Physical Activity: Inactive (01/09/2019)   Exercise Vital Sign    Days of Exercise per Week: 0 days    Minutes of Exercise per Session: 0 min  Stress: Stress Concern Present (01/09/2019)   [employer] of Occupational Health - Occupational Stress Questionnaire    Feeling of Stress : Rather much  Social Connections: Not on file  Intimate Partner Violence: Not on file    Family History  Problem Relation Age of Onset   Schizophrenia Paternal Grandmother      Review of Systems  Constitutional: Negative.  Negative for chills and fever.  HENT: Negative.  Negative for congestion and sore throat.   Respiratory: Negative.  Negative for cough and shortness of breath.   Cardiovascular: Negative.  Negative for chest pain and palpitations.  Gastrointestinal:  Negative for abdominal pain, diarrhea, nausea and vomiting.  Genitourinary: Negative.  Negative for dysuria and hematuria.  Skin: Negative.  Negative for rash.  Neurological: Negative.  Negative for dizziness and headaches.  All other systems reviewed and are negative.   Vitals:   03/24/23 1001  BP: 120/74  Pulse: 82  Temp: 97.9 F (36.6 C)  SpO2: 96%    Physical Exam Vitals reviewed.  Constitutional:      Appearance: Normal  appearance.  HENT:     Head: Normocephalic.     Right Ear: Tympanic membrane, ear canal and external ear normal.     Left Ear: Tympanic membrane, ear canal and external ear normal.     Mouth/Throat:     Mouth: Mucous membranes are moist.     Pharynx: Oropharynx is clear.  Eyes:     Extraocular Movements: Extraocular movements intact.     Conjunctiva/sclera: Conjunctivae normal.     Pupils: Pupils are equal, round, and reactive to light.  Cardiovascular:     Rate and Rhythm: Normal rate and regular rhythm.     Pulses: Normal pulses.     Heart sounds: Normal heart sounds.  Pulmonary:  Effort: Pulmonary effort is normal.     Breath sounds: Normal breath sounds.  Abdominal:     Palpations: Abdomen is soft.     Tenderness: There is no abdominal tenderness.  Musculoskeletal:     Cervical back: No tenderness.     Right lower leg: No edema.     Left lower leg: No edema.  Lymphadenopathy:     Cervical: No cervical adenopathy.  Skin:    General: Skin is warm and dry.     Capillary Refill: Capillary refill takes less than 2 seconds.     Findings: No rash.  Neurological:     General: No focal deficit present.     Mental Status: He is alert and oriented to person, place, and time.  Psychiatric:        Mood and Affect: Mood normal.        Behavior: Behavior normal.      ASSESSMENT & PLAN: Problem List Items Addressed This Visit       Other   GAD (generalized anxiety disorder)   Bipolar disorder (HCC)   Other Visit Diagnoses     Routine general medical examination at a health care facility    -  Primary   Relevant Orders   CBC with Differential   Comprehensive metabolic panel   Hemoglobin A1c   Lipid panel   Hepatitis C antibody screen   Foot swelling       Rash of foot       Encounter to establish care       Need for hepatitis C screening test       Relevant Orders   Hepatitis C antibody screen   Screening for deficiency anemia       Relevant Orders   CBC with  Differential   Screening for lipoid disorders       Relevant Orders   Lipid panel   Screening for endocrine, metabolic and immunity disorder       Relevant Orders   Comprehensive metabolic panel   Hemoglobin A1c   TSH   Current smoker            Modifiable risk factors discussed with patient. Anticipatory guidance according to age provided. The following topics were also discussed: Social Determinants of Health Smoking and cardiovascular/cancer risk associated with vaping Diet and nutrition and need to decrease amount of daily carbohydrate intake and daily calories and increase amount of plant-based protein in his diet Benefits of exercise Cancer family history review Vaccinations reviewed and recommendations Cardiovascular risk assessment and need for blood work Mental health including depression and anxiety Fall and accident prevention  Patient Instructions  Health Maintenance, Male Adopting a healthy lifestyle and getting preventive care are important in promoting health and wellness. Ask your health care provider about: The right schedule for you to have regular tests and exams. Things you can do on your own to prevent diseases and keep yourself healthy. What should I know about diet, weight, and exercise? Eat a healthy diet  Eat a diet that includes plenty of vegetables, fruits, low-fat dairy products, and lean protein. Do not eat a lot of foods that are high in solid fats, added sugars, or sodium. Maintain a healthy weight Body mass index (BMI) is a measurement that can be used to identify possible weight problems. It estimates body fat based on height and weight. Your health care provider can help determine your BMI and help you achieve or maintain a healthy weight. Get regular exercise  Get regular exercise. This is one of the most important things you can do for your health. Most adults should: Exercise for at least 150 minutes each week. The exercise should increase  your heart rate and make you sweat (moderate-intensity exercise). Do strengthening exercises at least twice a week. This is in addition to the moderate-intensity exercise. Spend less time sitting. Even light physical activity can be beneficial. Watch cholesterol and blood lipids Have your blood tested for lipids and cholesterol at 36 years of age, then have this test every 5 years. You may need to have your cholesterol levels checked more often if: Your lipid or cholesterol levels are high. You are older than 36 years of age. You are at high risk for heart disease. What should I know about cancer screening? Many types of cancers can be detected early and may often be prevented. Depending on your health history and family history, you may need to have cancer screening at various ages. This may include screening for: Colorectal cancer. Prostate cancer. Skin cancer. Lung cancer. What should I know about heart disease, diabetes, and high blood pressure? Blood pressure and heart disease High blood pressure causes heart disease and increases the risk of stroke. This is more likely to develop in people who have high blood pressure readings or are overweight. Talk with your health care provider about your target blood pressure readings. Have your blood pressure checked: Every 3-5 years if you are 6-55 years of age. Every year if you are 40 years old or older. If you are between the ages of 63 and 21 and are a current or former smoker, ask your health care provider if you should have a one-time screening for abdominal aortic aneurysm (AAA). Diabetes Have regular diabetes screenings. This checks your fasting blood sugar level. Have the screening done: Once every three years after age 41 if you are at a normal weight and have a low risk for diabetes. More often and at a younger age if you are overweight or have a high risk for diabetes. What should I know about preventing infection? Hepatitis B If  you have a higher risk for hepatitis B, you should be screened for this virus. Talk with your health care provider to find out if you are at risk for hepatitis B infection. Hepatitis C Blood testing is recommended for: Everyone born from [birth year] through 1965. Anyone with known risk factors for hepatitis C. Sexually transmitted infections (STIs) You should be screened each year for STIs, including gonorrhea and chlamydia, if: You are sexually active and are younger than 36 years of age. You are older than 36 years of age and your health care provider tells you that you are at risk for this type of infection. Your sexual activity has changed since you were last screened, and you are at increased risk for chlamydia or gonorrhea. Ask your health care provider if you are at risk. Ask your health care provider about whether you are at high risk for HIV. Your health care provider may recommend a prescription medicine to help prevent HIV infection. If you choose to take medicine to prevent HIV, you should first get tested for HIV. You should then be tested every 3 months for as long as you are taking the medicine. Follow these instructions at home: Alcohol use Do not drink alcohol if your health care provider tells you not to drink. If you drink alcohol: Limit how much you have to 0-2 drinks a day. Know how much  alcohol is in your drink. In the [place] one drink equals one 12 oz bottle of beer (355 mL), one 5 oz glass of wine (148 mL), or one 1 oz glass of hard liquor (44 mL). Lifestyle Do not use any products that contain nicotine or tobacco. These products include cigarettes, chewing tobacco, and vaping devices, such as e-cigarettes. If you need help quitting, ask your health care provider. Do not use street drugs. Do not share needles. Ask your health care provider for help if you need support or information about quitting drugs. General instructions Schedule regular health, dental, and eye  exams. Stay current with your vaccines. Tell your health care provider if: You often feel depressed. You have ever been abused or do not feel safe at home. Summary Adopting a healthy lifestyle and getting preventive care are important in promoting health and wellness. Follow your health care provider's instructions about healthy diet, exercising, and getting tested or screened for diseases. Follow your health care provider's instructions on monitoring your cholesterol and blood pressure. This information is not intended to replace advice given to you by your health care provider. Make sure you discuss any questions you have with your health care provider. Document Revised: 02/26/2021 Document Reviewed: 02/26/2021 Elsevier Patient Education  2024 Elsevier Inc.      Edwina Barth, MD [facility]

## 2023-03-25 LAB — HEPATITIS C ANTIBODY: Hepatitis C Ab: NONREACTIVE

## 2023-03-26 ENCOUNTER — Ambulatory Visit: Payer: 59 | Admitting: Mental Health

## 2023-04-07 ENCOUNTER — Encounter: Payer: Self-pay | Admitting: Adult Health

## 2023-04-07 ENCOUNTER — Ambulatory Visit (INDEPENDENT_AMBULATORY_CARE_PROVIDER_SITE_OTHER): Payer: 59 | Admitting: Adult Health

## 2023-04-07 DIAGNOSIS — F411 Generalized anxiety disorder: Secondary | ICD-10-CM | POA: Diagnosis not present

## 2023-04-07 DIAGNOSIS — F331 Major depressive disorder, recurrent, moderate: Secondary | ICD-10-CM

## 2023-04-07 DIAGNOSIS — G4700 Insomnia, unspecified: Secondary | ICD-10-CM | POA: Diagnosis not present

## 2023-04-07 DIAGNOSIS — F900 Attention-deficit hyperactivity disorder, predominantly inattentive type: Secondary | ICD-10-CM | POA: Diagnosis not present

## 2023-04-07 MED ORDER — METHYLPHENIDATE HCL ER (OSM) 36 MG PO TBCR: EXTENDED_RELEASE_TABLET | ORAL | 0 refills | Status: DC

## 2023-04-07 MED ORDER — BUPROPION HCL ER (XL) 300 MG PO TB24: 300.0000 mg | ORAL_TABLET | Freq: Every day | ORAL | 5 refills | Status: DC

## 2023-04-07 NOTE — Progress Notes (Signed)
Adam Larsen [id number] 1986-07-13 37 y.o.  Subjective:   Patient ID:  Adam Larsen. is a 37 y.o. (DOB 1985/11/13) male.  Chief Complaint: No chief complaint on file.   HPI Gleason Berber. presents to the office today for follow-up of MDD, GAD, insomnia, paranoia, and ADHD.  Describes mood today as "ok". Pleasant. Denies tearfulness. Mood symptoms - denies depression, irritability, and anxiety. Mood has been neutral. Stating "I feel pretty neutral". Has not restarted the Amitriptyline. Feels like other medications are helpful. Seeing [name] for therapy. Improved interest and motivation. Taking medications as prescribed.  Energy levels stable. Active, has a regular exercise routine. Walking and riding bike. Enjoys some usual interests and activities. Single. Lives with parents. Spending time with family.  Appetite adequate. Weight gain - 215 to 225 pounds. Sleeps well most nights. Averages 7 to 8 hours a night. Report some daytime napping. Focus and concentration improved. Diagnosed with ADHD in childhood. Completing tasks around the house. Denies SI or HI.   Denies AH or VH.  Denies paranoia. Denies self harm. Denies substance use.  Previous medication: [name]   PHQ2-9    Flowsheet Row Office Visit from 03/24/2023 in [facility] [place] ED from 05/16/2020 in [facility] Emergency Department at [facility]  PHQ-2 Total Score 0 6  PHQ-9 Total Score 0 17      Flowsheet Row ED from 01/24/2023 in [facility]) ED from 12/25/2022 in [facility] ED from 04/19/2020 in [facility] Emergency Department at [facility]  C-SSRS RISK CATEGORY No Risk No Risk Error: Q2 is Yes, you must answer 3, 4, and 5        Review of Systems:  Review of Systems  Musculoskeletal:  Negative for gait problem.  Neurological:  Negative for tremors.   Psychiatric/Behavioral:         Please refer to HPI    Medications: I have reviewed the patient's current medications.  Current Outpatient Medications  Medication Sig Dispense Refill   Ascorbic Acid (VITAMIN C) 1000 MG tablet Take 2,000 mg by mouth daily.     buPROPion (WELLBUTRIN XL) 300 MG 24 hr tablet Take 1 tablet (300 mg total) by mouth daily. 30 tablet 5   cholecalciferol (VITAMIN D3) 25 MCG (1000 UNIT) tablet Take 2,000 Units by mouth daily.     hydrOXYzine (ATARAX) 25 MG tablet Take 0.5-1 tablets (12.5-25 mg total) by mouth every 8 (eight) hours as needed for itching. (Patient not taking: Reported on 03/24/2023) 30 tablet 0   loratadine (CLARITIN) 10 MG tablet Take 1 tablet (10 mg total) by mouth daily. 30 tablet 0   methylphenidate (CONCERTA) 36 MG PO CR tablet Take one tablet in the morning and one tablet at lunch. 60 tablet 0   Multiple Vitamin (MULTIVITAMIN) capsule Take 1 capsule by mouth daily.     omega-3 acid ethyl esters (LOVAZA) 1 g capsule Take 1 capsule (1 g total) by mouth 2 (two) times daily. 60 capsule 0   Omega-3 Fatty Acids (FISH OIL) 1000 MG CAPS Take 1,000 mg by mouth daily.     perphenazine (TRILAFON) 2 MG tablet Take 1 tablet (2 mg) by mouth with a 4 mg tablet twice a day (total of 6 mg bid) 60 tablet 5   perphenazine (TRILAFON) 4 MG tablet Take 1 tablet (4 mg) by mouth with a 2 mg tablet twice a  day. (6 mg bid) 60 tablet 5   No current facility-administered medications for this visit.    Medication Side Effects: None  Allergies: No Known Allergies  Past Medical History:  Diagnosis Date   Anxiety    Bipolar 1 disorder (HCC)    Depression    Mania (HCC)    Suicidal ideations     Past Medical History, Surgical history, Social history, and Family history were reviewed and updated as appropriate.   Please see review of systems for further details on the patient's review from today.   Objective:   Physical Exam:  There were no vitals taken for this  visit.  Physical Exam Constitutional:      General: He is not in acute distress. Musculoskeletal:        General: No deformity.  Neurological:     Mental Status: He is alert and oriented to person, place, and time.     Coordination: Coordination normal.  Psychiatric:        Attention and Perception: Attention and perception normal. He does not perceive auditory or visual hallucinations.        Mood and Affect: Mood normal. Mood is not anxious or depressed. Affect is not labile, blunt, angry or inappropriate.        Speech: Speech normal.        Behavior: Behavior normal.        Thought Content: Thought content normal. Thought content is not paranoid or delusional. Thought content does not include homicidal or suicidal ideation. Thought content does not include homicidal or suicidal plan.        Cognition and Memory: Cognition and memory normal.        Judgment: Judgment normal.     Comments: Insight intact     Lab Review:     Component Value Date/Time   NA 139 03/24/2023 1043   NA 141 12/25/2022 1303   K 4.0 03/24/2023 1043   CL 101 03/24/2023 1043   CO2 27 03/24/2023 1043   GLUCOSE 95 03/24/2023 1043   BUN 14 03/24/2023 1043   BUN 11 12/25/2022 1303   CREATININE 1.10 03/24/2023 1043   CALCIUM 9.8 03/24/2023 1043   PROT 7.6 03/24/2023 1043   PROT 7.0 12/25/2022 1303   ALBUMIN 4.7 03/24/2023 1043   ALBUMIN 4.6 12/25/2022 1303   AST 20 03/24/2023 1043   ALT 30 03/24/2023 1043   ALKPHOS 66 03/24/2023 1043   BILITOT 0.4 03/24/2023 1043   BILITOT 0.3 12/25/2022 1303   GFRNONAA >60 05/16/2020 0912   GFRAA >60 05/16/2020 0912       Component Value Date/Time   WBC 11.7 (H) 03/24/2023 1043   RBC 5.10 03/24/2023 1043   HGB 15.5 03/24/2023 1043   HGB 15.4 12/25/2022 1303   HCT 46.3 03/24/2023 1043   HCT 44.4 12/25/2022 1303   PLT 339.0 03/24/2023 1043   PLT 336 12/25/2022 1303   MCV 90.8 03/24/2023 1043   MCV 92 12/25/2022 1303   MCH 31.9 12/25/2022 1303   MCH 29.9  05/16/2020 0912   MCHC 33.4 03/24/2023 1043   RDW 13.1 03/24/2023 1043   RDW 12.4 12/25/2022 1303   LYMPHSABS 4.5 (H) 03/24/2023 1043   LYMPHSABS 3.3 (H) 12/25/2022 1303   MONOABS 0.7 03/24/2023 1043   EOSABS 0.2 03/24/2023 1043   EOSABS 0.1 12/25/2022 1303   BASOSABS 0.1 03/24/2023 1043   BASOSABS 0.1 12/25/2022 1303    No results found for: "POCLITH", "LITHIUM"   No results found  for: "PHENYTOIN", "PHENOBARB", "VALPROATE", "CBMZ"   .res Assessment: Plan:    Plan:  Perphenazine 6mg  in am and 6mg  at bedtime   Wellbutrin XL 300mg  daily Concerta 36mg  twice daily. Gave one paper copy  D/C Amitriptyline 50mg  at hs   Monitor BP between visits while taking stimulant medication. Last taken 03/24/2023 120/74/82  RTC 4 weeks  Discussed potential benefits, risks, and side effects of stimulants with patient to include increased heart rate, palpitations, insomnia, increased anxiety, increased irritability, or decreased appetite. Also discussed history of paranoia and to watch for any change in moods. Will also discuss changes with parents. Instructed patient to contact office if experiencing any significant tolerability issues.   Patient advised to contact office with any questions, adverse effects, or acute worsening in signs and symptoms.  Discussed potential metabolic side effects associated with atypical antipsychotics, as well as potential risk for movement side effects. Advised pt to contact office if movement side effects occur.    Diagnoses and all orders for this visit:  Major depressive disorder, recurrent episode, moderate (HCC) -     buPROPion (WELLBUTRIN XL) 300 MG 24 hr tablet; Take 1 tablet (300 mg total) by mouth daily.  ADHD, predominantly inattentive type -     methylphenidate (CONCERTA) 36 MG PO CR tablet; Take one tablet in the morning and one tablet at lunch.  GAD (generalized anxiety disorder)  Insomnia, unspecified type     Please see After Visit Summary  for patient specific instructions.  No future appointments.   No orders of the defined types were placed in this encounter.   -------------------------------

## 2023-05-05 ENCOUNTER — Encounter: Payer: Self-pay | Admitting: Adult Health

## 2023-05-05 ENCOUNTER — Ambulatory Visit (INDEPENDENT_AMBULATORY_CARE_PROVIDER_SITE_OTHER): Payer: 59 | Admitting: Adult Health

## 2023-05-05 DIAGNOSIS — F331 Major depressive disorder, recurrent, moderate: Secondary | ICD-10-CM

## 2023-05-05 DIAGNOSIS — F411 Generalized anxiety disorder: Secondary | ICD-10-CM | POA: Diagnosis not present

## 2023-05-05 DIAGNOSIS — G4700 Insomnia, unspecified: Secondary | ICD-10-CM | POA: Diagnosis not present

## 2023-05-05 DIAGNOSIS — F900 Attention-deficit hyperactivity disorder, predominantly inattentive type: Secondary | ICD-10-CM | POA: Diagnosis not present

## 2023-05-05 MED ORDER — METHYLPHENIDATE HCL ER (OSM) 36 MG PO TBCR: EXTENDED_RELEASE_TABLET | ORAL | 0 refills | Status: DC

## 2023-05-05 NOTE — Progress Notes (Signed)
Adam Larsen [id number] 1986/05/20 37 y.o.  Subjective:   Patient ID:  Adam Larsen. is a 37 y.o. (DOB 1986-10-11) male.  Chief Complaint: No chief complaint on file.   HPI Adam Larsen. presents to the office today for follow-up of MDD, GAD, insomnia, paranoia, and ADHD.  Describes mood today as "ok". Pleasant. Denies tearfulness. Mood symptoms - denies depression, irritability, and anxiety. Denies panic attacks. Reports some worry, rumination, and over thinking. Mood has been neutral. Stating "I feel pretty level". Seeing [name] for therapy. Improved interest and motivation. Taking medications as prescribed.  Energy levels stable. Active, does not have a regular exercise routine.  Enjoys some usual interests and activities. Single. Lives with parents. Spending time with family.  Appetite adequate. Weight gain - 230 pounds. Sleeps well most nights. Averages 7 to 8 hours a night. Report some daytime napping - 2 hours. Focus and concentration stable. Diagnosed with ADHD in childhood. Completing tasks around the house. Denies SI or HI.   Denies AH or VH.  Denies paranoia. Denies self harm. Denies substance use.  Previous medication: [name]    PHQ2-9    Flowsheet Row Office Visit from 03/24/2023 in [facility] [place] ED from 05/16/2020 in [facility] Emergency Department at [facility]  PHQ-2 Total Score 0 6  PHQ-9 Total Score 0 17      Flowsheet Row ED from 01/24/2023 in [facility]) ED from 12/25/2022 in [facility] at [facility]) ED from 04/19/2020 in [facility] Emergency Department at [facility]  C-SSRS RISK CATEGORY No Risk No Risk Error: Q2 is Yes, you must answer 3, 4, and 5        Review of Systems:  Review of Systems  Musculoskeletal:  Negative for gait problem.  Neurological:  Negative for tremors.   Psychiatric/Behavioral:         Please refer to HPI    Medications: I have reviewed the patient's current medications.  Current Outpatient Medications  Medication Sig Dispense Refill   Ascorbic Acid (VITAMIN C) 1000 MG tablet Take 2,000 mg by mouth daily.     buPROPion (WELLBUTRIN XL) 300 MG 24 hr tablet Take 1 tablet (300 mg total) by mouth daily. 30 tablet 5   cholecalciferol (VITAMIN D3) 25 MCG (1000 UNIT) tablet Take 2,000 Units by mouth daily.     hydrOXYzine (ATARAX) 25 MG tablet Take 0.5-1 tablets (12.5-25 mg total) by mouth every 8 (eight) hours as needed for itching. (Patient not taking: Reported on 03/24/2023) 30 tablet 0   loratadine (CLARITIN) 10 MG tablet Take 1 tablet (10 mg total) by mouth daily. 30 tablet 0   methylphenidate (CONCERTA) 36 MG PO CR tablet Take one tablet in the morning and one tablet at lunch. 60 tablet 0   Multiple Vitamin (MULTIVITAMIN) capsule Take 1 capsule by mouth daily.     omega-3 acid ethyl esters (LOVAZA) 1 g capsule Take 1 capsule (1 g total) by mouth 2 (two) times daily. 60 capsule 0   Omega-3 Fatty Acids (FISH OIL) 1000 MG CAPS Take 1,000 mg by mouth daily.     perphenazine (TRILAFON) 2 MG tablet Take 1 tablet (2 mg) by mouth with a 4 mg tablet twice a day (total of 6 mg bid) 60 tablet 5   perphenazine (TRILAFON) 4 MG tablet Take 1 tablet (4 mg) by mouth with a 2 mg tablet twice a  day. (6 mg bid) 60 tablet 5   No current facility-administered medications for this visit.    Medication Side Effects: None  Allergies: No Known Allergies  Past Medical History:  Diagnosis Date   Anxiety    Bipolar 1 disorder (HCC)    Depression    Mania (HCC)    Suicidal ideations     Past Medical History, Surgical history, Social history, and Family history were reviewed and updated as appropriate.   Please see review of systems for further details on the patient's review from today.   Objective:   Physical Exam:  There were no vitals taken for this  visit.  Physical Exam Constitutional:      General: He is not in acute distress. Musculoskeletal:        General: No deformity.  Neurological:     Mental Status: He is alert and oriented to person, place, and time.     Coordination: Coordination normal.  Psychiatric:        Attention and Perception: Attention and perception normal. He does not perceive auditory or visual hallucinations.        Mood and Affect: Affect is not labile, blunt, angry or inappropriate.        Speech: Speech normal.        Behavior: Behavior normal.        Thought Content: Thought content normal. Thought content is not paranoid or delusional. Thought content does not include homicidal or suicidal ideation. Thought content does not include homicidal or suicidal plan.        Cognition and Memory: Cognition and memory normal.        Judgment: Judgment normal.     Comments: Insight intact     Lab Review:     Component Value Date/Time   NA 139 03/24/2023 1043   NA 141 12/25/2022 1303   K 4.0 03/24/2023 1043   CL 101 03/24/2023 1043   CO2 27 03/24/2023 1043   GLUCOSE 95 03/24/2023 1043   BUN 14 03/24/2023 1043   BUN 11 12/25/2022 1303   CREATININE 1.10 03/24/2023 1043   CALCIUM 9.8 03/24/2023 1043   PROT 7.6 03/24/2023 1043   PROT 7.0 12/25/2022 1303   ALBUMIN 4.7 03/24/2023 1043   ALBUMIN 4.6 12/25/2022 1303   AST 20 03/24/2023 1043   ALT 30 03/24/2023 1043   ALKPHOS 66 03/24/2023 1043   BILITOT 0.4 03/24/2023 1043   BILITOT 0.3 12/25/2022 1303   GFRNONAA >60 05/16/2020 0912   GFRAA >60 05/16/2020 0912       Component Value Date/Time   WBC 11.7 (H) 03/24/2023 1043   RBC 5.10 03/24/2023 1043   HGB 15.5 03/24/2023 1043   HGB 15.4 12/25/2022 1303   HCT 46.3 03/24/2023 1043   HCT 44.4 12/25/2022 1303   PLT 339.0 03/24/2023 1043   PLT 336 12/25/2022 1303   MCV 90.8 03/24/2023 1043   MCV 92 12/25/2022 1303   MCH 31.9 12/25/2022 1303   MCH 29.9 05/16/2020 0912   MCHC 33.4 03/24/2023 1043    RDW 13.1 03/24/2023 1043   RDW 12.4 12/25/2022 1303   LYMPHSABS 4.5 (H) 03/24/2023 1043   LYMPHSABS 3.3 (H) 12/25/2022 1303   MONOABS 0.7 03/24/2023 1043   EOSABS 0.2 03/24/2023 1043   EOSABS 0.1 12/25/2022 1303   BASOSABS 0.1 03/24/2023 1043   BASOSABS 0.1 12/25/2022 1303    No results found for: "POCLITH", "LITHIUM"   No results found for: "PHENYTOIN", "PHENOBARB", "VALPROATE", "CBMZ"   .res  Assessment: Plan:    Plan:  Perphenazine 6mg  in am and 6mg  at bedtime   Wellbutrin XL 300mg  daily Concerta 36mg  twice daily. Gave one paper copy  Monitor BP between visits while taking stimulant medication.  RTC 4 weeks  Discussed potential benefits, risks, and side effects of stimulants with patient to include increased heart rate, palpitations, insomnia, increased anxiety, increased irritability, or decreased appetite. Also discussed history of paranoia and to watch for any change in moods. Will also discuss changes with parents. Instructed patient to contact office if experiencing any significant tolerability issues.   Patient advised to contact office with any questions, adverse effects, or acute worsening in signs and symptoms.  Discussed potential metabolic side effects associated with atypical antipsychotics, as well as potential risk for movement side effects. Advised pt to contact office if movement side effects occur.    Diagnoses and all orders for this visit:  ADHD, predominantly inattentive type     Please see After Visit Summary for patient specific instructions.  No future appointments.   No orders of the defined types were placed in this encounter.   -------------------------------

## 2023-05-13 ENCOUNTER — Telehealth: Payer: Self-pay | Admitting: Adult Health

## 2023-05-13 NOTE — Telephone Encounter (Signed)
Patient does not have any pharmacies on his profile. [facility] which [facility]. Called patient and left VM.

## 2023-05-13 NOTE — Telephone Encounter (Signed)
[name], [facility] re: Adam Larsen, DOB [birth year]. RX was Concerta. They need to verify if this is a legit prescription from you?

## 2023-05-14 NOTE — Telephone Encounter (Signed)
Called patient again and got name of [facility] [address]. Called and verified that it was a valid Rx.

## 2023-06-02 ENCOUNTER — Ambulatory Visit: Payer: 59 | Admitting: Adult Health

## 2023-06-02 ENCOUNTER — Encounter: Payer: Self-pay | Admitting: Adult Health

## 2023-06-02 DIAGNOSIS — F900 Attention-deficit hyperactivity disorder, predominantly inattentive type: Secondary | ICD-10-CM | POA: Diagnosis not present

## 2023-06-02 DIAGNOSIS — F331 Major depressive disorder, recurrent, moderate: Secondary | ICD-10-CM | POA: Diagnosis not present

## 2023-06-02 DIAGNOSIS — G4700 Insomnia, unspecified: Secondary | ICD-10-CM

## 2023-06-02 DIAGNOSIS — F411 Generalized anxiety disorder: Secondary | ICD-10-CM

## 2023-06-02 MED ORDER — METHYLPHENIDATE HCL ER (OSM) 36 MG PO TBCR: EXTENDED_RELEASE_TABLET | ORAL | 0 refills | Status: DC

## 2023-06-02 NOTE — Progress Notes (Signed)
Adam Larsen [id number] 1985-11-25 37 y.o.  Subjective:   Patient ID:  Adam Larsen. is a 37 y.o. (DOB Jun 28, 1986) male.  Chief Complaint: No chief complaint on file.   HPI Cadon Kiraly. presents to the office today for follow-up of MDD, GAD, insomnia, paranoia, and ADHD.  Describes mood today as "ok". Pleasant. Denies tearfulness. Mood symptoms - denies depression, irritability, and anxiety. Denies panic attacks. Denies worry, rumination, and over thinking. Mood is flat. Stating "I feel pretty neutral". Seeing [name] for therapy. Improved interest and motivation. Taking medications as prescribed.  Energy levels stable. Active, does not have a regular exercise routine.  Enjoys some usual interests and activities. Single. Lives with parents. Spending time with family.  Appetite adequate. Weight gain - 230+ pounds. Sleeps well most nights. Averages 7 to 8 hours a night. Report some daytime napping - 1 to 2 hours. Focus and concentration stable. Diagnosed with ADHD in childhood. Completing tasks around the house. Looking for employment. Denies SI or HI.   Denies AH or VH.  Denies paranoia. Denies self harm. Denies substance use.  Previous medication: [name]   PHQ2-9    Flowsheet Row Office Visit from 03/24/2023 in [facility] [place] ED from [facility]/16/2020 in [facility] Emergency Department at [facility]  PHQ-2 Total Score 0 6  PHQ-9 Total Score 0 17      Flowsheet Row ED from 01/24/2023 in [facility]) ED from 12/25/2022 in [facility] at [facility]) ED from 04/19/2020 in [facility] Emergency Department at [facility]  C-SSRS RISK CATEGORY No Risk No Risk Error: Q2 is Yes, you must answer 3, 4, and 5        Review of Systems:  Review of Systems  Musculoskeletal:  Negative for gait problem.  Neurological:  Negative for tremors.   Psychiatric/Behavioral:         Please refer to HPI    Medications: I have reviewed the patient's current medications.  Current Outpatient Medications  Medication Sig Dispense Refill   Ascorbic Acid (VITAMIN C) 1000 MG tablet Take 2,000 mg by mouth daily.     buPROPion (WELLBUTRIN XL) 300 MG 24 hr tablet Take 1 tablet (300 mg total) by mouth daily. 30 tablet 5   cholecalciferol (VITAMIN D3) 25 MCG (1000 UNIT) tablet Take 2,000 Units by mouth daily.     hydrOXYzine (ATARAX) 25 MG tablet Take 0.5-1 tablets (12.5-25 mg total) by mouth every 8 (eight) hours as needed for itching. (Patient not taking: Reported on 03/24/2023) 30 tablet 0   loratadine (CLARITIN) 10 MG tablet Take 1 tablet (10 mg total) by mouth daily. 30 tablet 0   methylphenidate (CONCERTA) 36 MG PO CR tablet Take one tablet in the morning and one tablet at lunch. 60 tablet 0   Multiple Vitamin (MULTIVITAMIN) capsule Take 1 capsule by mouth daily.     omega-3 acid ethyl esters (LOVAZA) 1 g capsule Take 1 capsule (1 g total) by mouth 2 (two) times daily. 60 capsule 0   Omega-3 Fatty Acids (FISH OIL) 1000 MG CAPS Take 1,000 mg by mouth daily.     perphenazine (TRILAFON) 2 MG tablet Take 1 tablet (2 mg) by mouth with a 4 mg tablet twice a day (total of 6 mg bid) 60 tablet 5   perphenazine (TRILAFON) 4 MG tablet Take 1 tablet (4 mg) by mouth with a 2 mg tablet  twice a day. (6 mg bid) 60 tablet 5   No current facility-administered medications for this visit.    Medication Side Effects: None  Allergies: No Known Allergies  Past Medical History:  Diagnosis Date   Anxiety    Bipolar 1 disorder (HCC)    Depression    Mania (HCC)    Suicidal ideations     Past Medical History, Surgical history, Social history, and Family history were reviewed and updated as appropriate.   Please see review of systems for further details on the patient's review from today.   Objective:   Physical Exam:  There were no vitals taken for this  visit.  Physical Exam Constitutional:      General: He is not in acute distress. Musculoskeletal:        General: No deformity.  Neurological:     Mental Status: He is alert and oriented to person, place, and time.     Coordination: Coordination normal.  Psychiatric:        Attention and Perception: Attention and perception normal. He does not perceive auditory or visual hallucinations.        Mood and Affect: Affect is not labile, blunt, angry or inappropriate.        Speech: Speech normal.        Behavior: Behavior normal.        Thought Content: Thought content normal. Thought content is not paranoid or delusional. Thought content does not include homicidal or suicidal ideation. Thought content does not include homicidal or suicidal plan.        Cognition and Memory: Cognition and memory normal.        Judgment: Judgment normal.     Comments: Insight intact     Lab Review:     Component Value Date/Time   NA 139 03/24/2023 1043   NA 141 12/25/2022 1303   K 4.0 03/24/2023 1043   CL 101 03/24/2023 1043   CO2 27 03/24/2023 1043   GLUCOSE 95 03/24/2023 1043   BUN 14 03/24/2023 1043   BUN 11 12/25/2022 1303   CREATININE 1.10 03/24/2023 1043   CALCIUM 9.8 03/24/2023 1043   PROT 7.6 03/24/2023 1043   PROT 7.0 12/25/2022 1303   ALBUMIN 4.7 03/24/2023 1043   ALBUMIN 4.6 12/25/2022 1303   AST 20 03/24/2023 1043   ALT 30 03/24/2023 1043   ALKPHOS 66 03/24/2023 1043   BILITOT 0.4 03/24/2023 1043   BILITOT 0.3 12/25/2022 1303   GFRNONAA >60 05/16/2020 0912   GFRAA >60 05/16/2020 0912       Component Value Date/Time   WBC 11.7 (H) 03/24/2023 1043   RBC 5.10 03/24/2023 1043   HGB 15.5 03/24/2023 1043   HGB 15.4 12/25/2022 1303   HCT 46.3 03/24/2023 1043   HCT 44.4 12/25/2022 1303   PLT 339.0 03/24/2023 1043   PLT 336 12/25/2022 1303   MCV 90.8 03/24/2023 1043   MCV 92 12/25/2022 1303   MCH 31.9 12/25/2022 1303   MCH 29.9 05/16/2020 0912   MCHC 33.4 03/24/2023 1043    RDW 13.1 03/24/2023 1043   RDW 12.4 12/25/2022 1303   LYMPHSABS 4.5 (H) 03/24/2023 1043   LYMPHSABS 3.3 (H) 12/25/2022 1303   MONOABS 0.7 03/24/2023 1043   EOSABS 0.2 03/24/2023 1043   EOSABS 0.1 12/25/2022 1303   BASOSABS 0.1 03/24/2023 1043   BASOSABS 0.1 12/25/2022 1303    No results found for: "POCLITH", "LITHIUM"   No results found for: "PHENYTOIN", "PHENOBARB", "VALPROATE", "CBMZ"   .  res Assessment: Plan:    Plan:  Perphenazine 6mg  in am and 6mg  at bedtime   Wellbutrin XL 300mg  daily Concerta 36mg  twice daily. Gave paper copy in case his pharmacy doesn't have it.  Monitor BP between visits while taking stimulant medication.  113/75/85  RTC 4 weeks  Discussed potential benefits, risks, and side effects of stimulants with patient to include increased heart rate, palpitations, insomnia, increased anxiety, increased irritability, or decreased appetite. Also discussed history of paranoia and to watch for any change in moods. Will also discuss changes with parents. Instructed patient to contact office if experiencing any significant tolerability issues.   Patient advised to contact office with any questions, adverse effects, or acute worsening in signs and symptoms.  Discussed potential metabolic side effects associated with atypical antipsychotics, as well as potential risk for movement side effects. Advised pt to contact office if movement side effects occur.    Diagnoses and all orders for this visit:  Major depressive disorder, recurrent episode, moderate degree (HCC)  ADHD, predominantly inattentive type -     methylphenidate (CONCERTA) 36 MG PO CR tablet; Take one tablet in the morning and one tablet at lunch.  GAD (generalized anxiety disorder)  Insomnia, unspecified type     Please see After Visit Summary for patient specific instructions.  No future appointments.   No orders of the defined types were placed in this  encounter.   -------------------------------

## 2023-07-01 ENCOUNTER — Encounter: Payer: Self-pay | Admitting: Adult Health

## 2023-07-01 ENCOUNTER — Ambulatory Visit (INDEPENDENT_AMBULATORY_CARE_PROVIDER_SITE_OTHER): Payer: 59 | Admitting: Adult Health

## 2023-07-01 DIAGNOSIS — G4700 Insomnia, unspecified: Secondary | ICD-10-CM | POA: Diagnosis not present

## 2023-07-01 DIAGNOSIS — F331 Major depressive disorder, recurrent, moderate: Secondary | ICD-10-CM

## 2023-07-01 DIAGNOSIS — F900 Attention-deficit hyperactivity disorder, predominantly inattentive type: Secondary | ICD-10-CM | POA: Diagnosis not present

## 2023-07-01 DIAGNOSIS — F411 Generalized anxiety disorder: Secondary | ICD-10-CM

## 2023-07-01 MED ORDER — PERPHENAZINE 2 MG PO TABS: ORAL_TABLET | ORAL | 5 refills | Status: DC

## 2023-07-01 MED ORDER — METHYLPHENIDATE HCL ER (OSM) 36 MG PO TBCR: EXTENDED_RELEASE_TABLET | ORAL | 0 refills | Status: DC

## 2023-07-01 MED ORDER — PERPHENAZINE 4 MG PO TABS: ORAL_TABLET | ORAL | 5 refills | Status: DC

## 2023-07-01 MED ORDER — BUPROPION HCL ER (XL) 300 MG PO TB24: 300.0000 mg | ORAL_TABLET | Freq: Every day | ORAL | 5 refills | Status: DC

## 2023-07-01 NOTE — Progress Notes (Signed)
Adam Larsen [id number] 01-24-1986 37 y.o.  Subjective:   Patient ID:  Adam Larsen. is a 37 y.o. (DOB [birth year]) male.  Chief Complaint: No chief complaint on file.   HPI Adam Larsen. presents to the office today for follow-up of MDD, GAD, insomnia, paranoia, and ADHD.  Describes mood today as "ok". Pleasant. Denies tearfulness. Mood symptoms - denies depression, irritability, and anxiety. Denies panic attacks. Reports some worry, rumination, and over thinking. Mood is "neutral". Stating "I feel like I'm doing alright". Seeing [name] for therapy. Improved interest and motivation. Taking medications as prescribed.  Energy levels stable. Active, does not have a regular exercise routine.  Enjoys some usual interests and activities. Single. Lives with parents. Spending time with family.  Appetite adequate. Weight gain - 240+ pounds. Sleeps well most nights. Averages 7 to 8 hours a night. Report some daytime napping - 1 to 2 hours. Focus and concentration stable. Diagnosed with ADHD in childhood. Completing tasks around the house. Looking for employment. Denies SI or HI.   Denies AH or VH.  Denies paranoia. Denies self harm. Denies substance use.  Previous medication: [name]   PHQ2-9    Flowsheet Row Office Visit from 03/24/2023 in [facility] [place] ED from 05/16/2020 in [facility] Emergency Department at [facility]  PHQ-2 Total Score 0 6  PHQ-9 Total Score 0 17      Flowsheet Row ED from 01/24/2023 in [facility] ED from 12/25/2022 in [facility] at [facility]) ED from 04/19/2020 in [facility] Emergency Department at [facility]  C-SSRS RISK CATEGORY No Risk No Risk Error: Q2 is Yes, you must answer 3, 4, and 5        Review of Systems:  Review of Systems  Musculoskeletal:  Negative for gait problem.  Neurological:  Negative  for tremors.  Psychiatric/Behavioral:         Please refer to HPI    Medications: I have reviewed the patient's current medications.  Current Outpatient Medications  Medication Sig Dispense Refill   Ascorbic Acid (VITAMIN C) 1000 MG tablet Take 2,000 mg by mouth daily.     buPROPion (WELLBUTRIN XL) 300 MG 24 hr tablet Take 1 tablet (300 mg total) by mouth daily. 30 tablet 5   cholecalciferol (VITAMIN D3) 25 MCG (1000 UNIT) tablet Take 2,000 Units by mouth daily.     hydrOXYzine (ATARAX) 25 MG tablet Take 0.5-1 tablets (12.5-25 mg total) by mouth every 8 (eight) hours as needed for itching. (Patient not taking: Reported on 03/24/2023) 30 tablet 0   loratadine (CLARITIN) 10 MG tablet Take 1 tablet (10 mg total) by mouth daily. 30 tablet 0   methylphenidate (CONCERTA) 36 MG PO CR tablet Take one tablet in the morning and one tablet at lunch. 60 tablet 0   Multiple Vitamin (MULTIVITAMIN) capsule Take 1 capsule by mouth daily.     omega-3 acid ethyl esters (LOVAZA) 1 g capsule Take 1 capsule (1 g total) by mouth 2 (two) times daily. 60 capsule 0   Omega-3 Fatty Acids (FISH OIL) 1000 MG CAPS Take 1,000 mg by mouth daily.     perphenazine (TRILAFON) 2 MG tablet Take 1 tablet (2 mg) by mouth with a 4 mg tablet twice a day (total of 6 mg bid) 60 tablet 5   perphenazine (TRILAFON) 4 MG tablet Take 1 tablet (4 mg) by mouth with a  2 mg tablet twice a day. (6 mg bid) 60 tablet 5   No current facility-administered medications for this visit.    Medication Side Effects: None  Allergies: No Known Allergies  Past Medical History:  Diagnosis Date   Anxiety    Bipolar 1 disorder (HCC)    Depression    Mania (HCC)    Suicidal ideations     Past Medical History, Surgical history, Social history, and Family history were reviewed and updated as appropriate.   Please see review of systems for further details on the patient's review from today.   Objective:   Physical Exam:  There were no vitals  taken for this visit.  Physical Exam Constitutional:      General: He is not in acute distress. Musculoskeletal:        General: No deformity.  Neurological:     Mental Status: He is alert and oriented to person, place, and time.     Coordination: Coordination normal.  Psychiatric:        Attention and Perception: Attention and perception normal. He does not perceive auditory or visual hallucinations.        Mood and Affect: Mood normal. Mood is not anxious or depressed. Affect is not labile, blunt, angry or inappropriate.        Speech: Speech normal.        Behavior: Behavior normal.        Thought Content: Thought content normal. Thought content is not paranoid or delusional. Thought content does not include homicidal or suicidal ideation. Thought content does not include homicidal or suicidal plan.        Cognition and Memory: Cognition and memory normal.        Judgment: Judgment normal.     Comments: Insight intact     Lab Review:     Component Value Date/Time   NA 139 03/24/2023 1043   NA 141 12/25/2022 1303   K 4.0 03/24/2023 1043   CL 101 03/24/2023 1043   CO2 27 03/24/2023 1043   GLUCOSE 95 03/24/2023 1043   BUN 14 03/24/2023 1043   BUN 11 12/25/2022 1303   CREATININE 1.10 03/24/2023 1043   CALCIUM 9.8 03/24/2023 1043   PROT 7.6 03/24/2023 1043   PROT 7.0 12/25/2022 1303   ALBUMIN 4.7 03/24/2023 1043   ALBUMIN 4.6 12/25/2022 1303   AST 20 03/24/2023 1043   ALT 30 03/24/2023 1043   ALKPHOS 66 03/24/2023 1043   BILITOT 0.4 03/24/2023 1043   BILITOT 0.3 12/25/2022 1303   GFRNONAA >60 05/16/2020 0912   GFRAA >60 05/16/2020 0912       Component Value Date/Time   WBC 11.7 (H) 03/24/2023 1043   RBC 5.10 03/24/2023 1043   HGB 15.5 03/24/2023 1043   HGB 15.4 12/25/2022 1303   HCT 46.3 03/24/2023 1043   HCT 44.4 12/25/2022 1303   PLT 339.0 03/24/2023 1043   PLT 336 12/25/2022 1303   MCV 90.8 03/24/2023 1043   MCV 92 12/25/2022 1303   MCH 31.9 12/25/2022  1303   MCH 29.9 05/16/2020 0912   MCHC 33.4 03/24/2023 1043   RDW 13.1 03/24/2023 1043   RDW 12.4 12/25/2022 1303   LYMPHSABS 4.5 (H) 03/24/2023 1043   LYMPHSABS 3.3 (H) 12/25/2022 1303   MONOABS 0.7 03/24/2023 1043   EOSABS 0.2 03/24/2023 1043   EOSABS 0.1 12/25/2022 1303   BASOSABS 0.1 03/24/2023 1043   BASOSABS 0.1 12/25/2022 1303    No results found for: "POCLITH", "LITHIUM"  No results found for: "PHENYTOIN", "PHENOBARB", "VALPROATE", "CBMZ"   .res Assessment: Plan:    Plan:  Perphenazine 6mg  in am and 6mg  at bedtime   Wellbutrin XL 300mg  daily Concerta 36mg  twice daily. Gave paper copy in case his pharmacy doesn't have it.  Monitor BP between visits while taking stimulant medication.  RTC 4 weeks  Discussed potential benefits, risks, and side effects of stimulants with patient to include increased heart rate, palpitations, insomnia, increased anxiety, increased irritability, or decreased appetite. Also discussed history of paranoia and to watch for any change in moods. Will also discuss changes with parents. Instructed patient to contact office if experiencing any significant tolerability issues.   Patient advised to contact office with any questions, adverse effects, or acute worsening in signs and symptoms.  Discussed potential metabolic side effects associated with atypical antipsychotics, as well as potential risk for movement side effects. Advised pt to contact office if movement side effects occur.   There are no diagnoses linked to this encounter.   Please see After Visit Summary for patient specific instructions.  Future Appointments  Date Time Provider Department Center  07/01/2023  8:00 AM Reynald Woods, Thereasa Solo, NP CP-CP None    No orders of the defined types were placed in this encounter.   -------------------------------

## 2023-07-29 ENCOUNTER — Telehealth: Payer: Self-pay | Admitting: Adult Health

## 2023-07-29 NOTE — Telephone Encounter (Signed)
He has RF on all other medications.

## 2023-07-29 NOTE — Telephone Encounter (Signed)
Concerta LF 9/18, due 10/16.

## 2023-07-29 NOTE — Telephone Encounter (Signed)
Pt had an appt 10/9 but he can't do telehealth. He is asking if he can push out the appt for another month out. He is doing fine. Asks for RF for the meds. Wellbutrin, Concerta and Perphenazine ( both doses). [facility] to [facility] on [address], [place], [place]

## 2023-07-30 ENCOUNTER — Ambulatory Visit: Payer: 59 | Admitting: Adult Health

## 2023-08-05 ENCOUNTER — Ambulatory Visit: Payer: 59 | Admitting: Adult Health

## 2023-08-05 DIAGNOSIS — Z0389 Encounter for observation for other suspected diseases and conditions ruled out: Secondary | ICD-10-CM

## 2023-08-05 NOTE — Progress Notes (Signed)
No charge - patient came and left before seen.

## 2023-08-06 ENCOUNTER — Other Ambulatory Visit: Payer: Self-pay

## 2023-08-06 DIAGNOSIS — F900 Attention-deficit hyperactivity disorder, predominantly inattentive type: Secondary | ICD-10-CM

## 2023-08-06 MED ORDER — METHYLPHENIDATE HCL ER (OSM) 36 MG PO TBCR: EXTENDED_RELEASE_TABLET | ORAL | 0 refills | Status: DC

## 2023-08-06 NOTE — Telephone Encounter (Signed)
Pended.

## 2023-08-06 NOTE — Telephone Encounter (Signed)
Patient needs appt, no show yesterday and canceled the appt previous to this one.

## 2023-08-06 NOTE — Telephone Encounter (Signed)
Lvm for pt to call and schedule

## 2023-08-06 NOTE — Telephone Encounter (Signed)
Pt is scheduled for 8/21. He [facility] [address]

## 2023-08-06 NOTE — Telephone Encounter (Signed)
Pt called this afternoon asking why his RX has not gotten sent in yet. I did call back and UTR, so I LM - he was supposed to be seen yesterday and missed the appt. He is R/S for 08/11/23 now.

## 2023-08-11 ENCOUNTER — Encounter: Payer: Self-pay | Admitting: Adult Health

## 2023-08-11 ENCOUNTER — Ambulatory Visit (INDEPENDENT_AMBULATORY_CARE_PROVIDER_SITE_OTHER): Payer: 59 | Admitting: Adult Health

## 2023-08-11 DIAGNOSIS — F411 Generalized anxiety disorder: Secondary | ICD-10-CM | POA: Diagnosis not present

## 2023-08-11 DIAGNOSIS — F900 Attention-deficit hyperactivity disorder, predominantly inattentive type: Secondary | ICD-10-CM

## 2023-08-11 DIAGNOSIS — F331 Major depressive disorder, recurrent, moderate: Secondary | ICD-10-CM | POA: Diagnosis not present

## 2023-08-11 DIAGNOSIS — G4700 Insomnia, unspecified: Secondary | ICD-10-CM | POA: Diagnosis not present

## 2023-08-11 DIAGNOSIS — F22 Delusional disorders: Secondary | ICD-10-CM

## 2023-08-11 MED ORDER — METHYLPHENIDATE HCL ER (OSM) 36 MG PO TBCR: EXTENDED_RELEASE_TABLET | ORAL | 0 refills | Status: DC

## 2023-08-11 NOTE — Progress Notes (Signed)
Adam Larsen [id number] [birth year] 37 y.o.  Subjective:   Patient ID:  Adam Larsen. is a 37 y.o. (DOB November 29, 1985) male.  Chief Complaint: No chief complaint on file.   HPI Adam Larsen. presents to the office today for follow-up of MDD, GAD, insomnia, paranoia, and ADHD.  Describes mood today as "ok". Pleasant. Denies tearfulness. Mood symptoms - denies depression, irritability, and anxiety. Denies panic attacks. Denies worry, rumination, and over thinking. Mood is "neutral". Stating "I feel like I'm doing ok". Seeing Adam Larsen for therapy. Improved interest and motivation. Taking medications as prescribed.  Energy levels stable. Active, does not have a regular exercise routine.  Enjoys some usual interests and activities. Single. Lives with parents. Spending time with family.  Appetite adequate. Weight gain - 240+ pounds. Sleeps well most nights. Averages 7 to 8 hours a night. Report some daytime napping - 1 to 2 hours. Focus and concentration stable. Diagnosed with ADHD in childhood. Completing tasks around the house. Looking for employment. Denies SI or HI.   Denies AH or VH.  Denies paranoia. Denies self harm. Denies substance use.  Previous medication: [name]   PHQ2-9    Flowsheet Row Office Visit from 03/24/2023 in [facility] [place] ED from 05/16/2020 in [facility] Emergency Department at [facility]  PHQ-2 Total Score 0 6  PHQ-9 Total Score 0 17      Flowsheet Row ED from 01/24/2023 in [facility] ED from 12/25/2022 in [facility] at [facility]) ED from 04/19/2020 in [facility] Emergency Department at [facility]  C-SSRS RISK CATEGORY No Risk No Risk Error: Q2 is Yes, you must answer 3, 4, and 5        Review of Systems:  Review of Systems  Musculoskeletal:  Negative for gait problem.  Neurological:  Negative for tremors.   Psychiatric/Behavioral:         Please refer to HPI    Medications: I have reviewed the patient's current medications.  Current Outpatient Medications  Medication Sig Dispense Refill   Ascorbic Acid (VITAMIN C) 1000 MG tablet Take 2,000 mg by mouth daily.     buPROPion (WELLBUTRIN XL) 300 MG 24 hr tablet Take 1 tablet (300 mg total) by mouth daily. 30 tablet 5   cholecalciferol (VITAMIN D3) 25 MCG (1000 UNIT) tablet Take 2,000 Units by mouth daily.     loratadine (CLARITIN) 10 MG tablet Take 1 tablet (10 mg total) by mouth daily. 30 tablet 0   methylphenidate (CONCERTA) 36 MG PO CR tablet Take one tablet in the morning and one tablet at lunch. 10 tablet 0   Multiple Vitamin (MULTIVITAMIN) capsule Take 1 capsule by mouth daily.     omega-3 acid ethyl esters (LOVAZA) 1 g capsule Take 1 capsule (1 g total) by mouth 2 (two) times daily. 60 capsule 0   Omega-3 Fatty Acids (FISH OIL) 1000 MG CAPS Take 1,000 mg by mouth daily.     perphenazine (TRILAFON) 2 MG tablet Take 1 tablet (2 mg) by mouth with a 4 mg tablet twice a day (total of 6 mg bid) 60 tablet 5   perphenazine (TRILAFON) 4 MG tablet Take 1 tablet (4 mg) by mouth with a 2 mg tablet twice a day. (6 mg bid) 60 tablet 5   No current facility-administered medications for this visit.    Medication Side Effects: None  Allergies: No Known Allergies  Past Medical History:  Diagnosis Date   Anxiety    Bipolar 1 disorder (HCC)    Depression    Mania (HCC)    Suicidal ideations     Past Medical History, Surgical history, Social history, and Family history were reviewed and updated as appropriate.   Please see review of systems for further details on the patient's review from today.   Objective:   Physical Exam:  There were no vitals taken for this visit.  Physical Exam Constitutional:      General: He is not in acute distress. Musculoskeletal:        General: No deformity.  Neurological:     Mental Status: He is alert and  oriented to person, place, and time.     Coordination: Coordination normal.  Psychiatric:        Attention and Perception: Attention and perception normal. He does not perceive auditory or visual hallucinations.        Mood and Affect: Affect is not labile, blunt, angry or inappropriate.        Speech: Speech normal.        Behavior: Behavior normal.        Thought Content: Thought content normal. Thought content is not paranoid or delusional. Thought content does not include homicidal or suicidal ideation. Thought content does not include homicidal or suicidal plan.        Cognition and Memory: Cognition and memory normal.        Judgment: Judgment normal.     Comments: Insight intact     Lab Review:     Component Value Date/Time   NA 139 03/24/2023 1043   NA 141 12/25/2022 1303   K 4.0 03/24/2023 1043   CL 101 03/24/2023 1043   CO2 27 03/24/2023 1043   GLUCOSE 95 03/24/2023 1043   BUN 14 03/24/2023 1043   BUN 11 12/25/2022 1303   CREATININE 1.10 03/24/2023 1043   CALCIUM 9.8 03/24/2023 1043   PROT 7.6 03/24/2023 1043   PROT 7.0 12/25/2022 1303   ALBUMIN 4.7 03/24/2023 1043   ALBUMIN 4.6 12/25/2022 1303   AST 20 03/24/2023 1043   ALT 30 03/24/2023 1043   ALKPHOS 66 03/24/2023 1043   BILITOT 0.4 03/24/2023 1043   BILITOT 0.3 12/25/2022 1303   GFRNONAA >60 05/16/2020 0912   GFRAA >60 05/16/2020 0912       Component Value Date/Time   WBC 11.7 (H) 03/24/2023 1043   RBC 5.10 03/24/2023 1043   HGB 15.5 03/24/2023 1043   HGB 15.4 12/25/2022 1303   HCT 46.3 03/24/2023 1043   HCT 44.4 12/25/2022 1303   PLT 339.0 03/24/2023 1043   PLT 336 12/25/2022 1303   MCV 90.8 03/24/2023 1043   MCV 92 12/25/2022 1303   MCH 31.9 12/25/2022 1303   MCH 29.9 05/16/2020 0912   MCHC 33.4 03/24/2023 1043   RDW 13.1 03/24/2023 1043   RDW 12.4 12/25/2022 1303   LYMPHSABS 4.5 (H) 03/24/2023 1043   LYMPHSABS 3.3 (H) 12/25/2022 1303   MONOABS 0.7 03/24/2023 1043   EOSABS 0.2 03/24/2023  1043   EOSABS 0.1 12/25/2022 1303   BASOSABS 0.1 03/24/2023 1043   BASOSABS 0.1 12/25/2022 1303    No results found for: "POCLITH", "LITHIUM"   No results found for: "PHENYTOIN", "PHENOBARB", "VALPROATE", "CBMZ"   .res Assessment: Plan:    Plan:  Perphenazine 6mg  in am and 6mg  at bedtime   Wellbutrin XL 300mg  daily Concerta 36mg  twice daily.   Monitor BP  between visits while taking stimulant medication.  RTC 4 weeks  Discussed potential benefits, risks, and side effects of stimulants with patient to include increased heart rate, palpitations, insomnia, increased anxiety, increased irritability, or decreased appetite. Also discussed history of paranoia and to watch for any change in moods. Will also discuss changes with parents. Instructed patient to contact office if experiencing any significant tolerability issues.   Patient advised to contact office with any questions, adverse effects, or acute worsening in signs and symptoms.  Discussed potential metabolic side effects associated with atypical antipsychotics, as well as potential risk for movement side effects. Advised pt to contact office if movement side effects occur.  There are no diagnoses linked to this encounter.   Please see After Visit Summary for patient specific instructions.  Future Appointments  Date Time Provider Department Center  08/11/2023  8:20 AM Ian Cavey, Thereasa Solo, NP CP-CP None    No orders of the defined types were placed in this encounter.   -------------------------------

## 2023-09-08 ENCOUNTER — Encounter: Payer: Self-pay | Admitting: Adult Health

## 2023-09-08 ENCOUNTER — Ambulatory Visit (INDEPENDENT_AMBULATORY_CARE_PROVIDER_SITE_OTHER): Payer: 59 | Admitting: Adult Health

## 2023-09-08 DIAGNOSIS — G4700 Insomnia, unspecified: Secondary | ICD-10-CM | POA: Diagnosis not present

## 2023-09-08 DIAGNOSIS — F900 Attention-deficit hyperactivity disorder, predominantly inattentive type: Secondary | ICD-10-CM

## 2023-09-08 DIAGNOSIS — F411 Generalized anxiety disorder: Secondary | ICD-10-CM | POA: Diagnosis not present

## 2023-09-08 DIAGNOSIS — F331 Major depressive disorder, recurrent, moderate: Secondary | ICD-10-CM | POA: Diagnosis not present

## 2023-09-08 MED ORDER — PERPHENAZINE 2 MG PO TABS: ORAL_TABLET | ORAL | 5 refills | Status: DC

## 2023-09-08 MED ORDER — BUPROPION HCL ER (XL) 300 MG PO TB24: 300.0000 mg | ORAL_TABLET | Freq: Every day | ORAL | 5 refills | Status: DC

## 2023-09-08 MED ORDER — PERPHENAZINE 4 MG PO TABS: ORAL_TABLET | ORAL | 5 refills | Status: DC

## 2023-09-08 MED ORDER — METHYLPHENIDATE HCL ER (OSM) 36 MG PO TBCR: EXTENDED_RELEASE_TABLET | ORAL | 0 refills | Status: DC

## 2023-09-08 NOTE — Progress Notes (Signed)
Adam Larsen [id number] 1986/10/15 37 y.o.  Subjective:   Patient ID:  Adam Larsen. is a 37 y.o. (DOB 1986-01-08) male.  Chief Complaint: No chief complaint on file.   HPI Adam Larsen. presents to the office today for follow-up of MDD, GAD, insomnia, paranoia, and ADHD.  Describes mood today as "ok". Pleasant. Denies tearfulness. Mood symptoms - denies depression, irritability, and anxiety. Denies panic attacks. Denies worry, rumination, and over thinking. Mood is pretty stable - "some good days and some bad days". Stating "I feel like I'm doing alright". Improved interest and motivation. Taking medications as prescribed.  Energy levels stable. Active, riding bike most days.   Enjoys some usual interests and activities. Single. Lives with parents. Spending time with family.  Appetite adequate. Reports weight loss. Sleeps well most nights. Averages 6 to 7 hours a night. Report some daytime napping - 1 hours. Focus and concentration stable. Diagnosed with ADHD in childhood. Completing tasks around the house. Looking for employment. Denies SI or HI.   Denies AH or VH.  Denies paranoia. Denies self harm. Denies substance use.  Previous medication: Adam Larsen   PHQ2-9    Flowsheet Row Office Visit from 03/24/2023 in [facility] from 05/16/2020 in [facility] Emergency Department at [facility]  PHQ-2 Total Score 0 6  PHQ-9 Total Score 0 17      Flowsheet Row ED from 01/24/2023 in [facility] at [facility]) ED from 12/25/2022 in [facility] at [facility]) ED from 04/19/2020 in [facility] Emergency Department at [facility]  C-SSRS RISK CATEGORY No Risk No Risk Error: Q2 is Yes, you must answer 3, 4, and 5        Review of Systems:  Review of Systems  Musculoskeletal:  Negative for gait problem.  Neurological:  Negative for tremors.   Psychiatric/Behavioral:         Please refer to HPI    Medications: I have reviewed the patient's current medications.  Current Outpatient Medications  Medication Sig Dispense Refill   Ascorbic Acid (VITAMIN C) 1000 MG tablet Take 2,000 mg by mouth daily.     buPROPion (WELLBUTRIN XL) 300 MG 24 hr tablet Take 1 tablet (300 mg total) by mouth daily. 30 tablet 5   cholecalciferol (VITAMIN D3) 25 MCG (1000 UNIT) tablet Take 2,000 Units by mouth daily.     loratadine (CLARITIN) 10 MG tablet Take 1 tablet (10 mg total) by mouth daily. 30 tablet 0   methylphenidate (CONCERTA) 36 MG PO CR tablet Take one tablet in the morning and one tablet at lunch. 60 tablet 0   Multiple Vitamin (MULTIVITAMIN) capsule Take 1 capsule by mouth daily.     omega-3 acid ethyl esters (LOVAZA) 1 g capsule Take 1 capsule (1 g total) by mouth 2 (two) times daily. 60 capsule 0   Omega-3 Fatty Acids (FISH OIL) 1000 MG CAPS Take 1,000 mg by mouth daily.     perphenazine (TRILAFON) 2 MG tablet Take 1 tablet (2 mg) by mouth with a 4 mg tablet twice a day (total of 6 mg bid) 60 tablet 5   perphenazine (TRILAFON) 4 MG tablet Take 1 tablet (4 mg) by mouth with a 2 mg tablet twice a day. (6 mg bid) 60 tablet 5   No current facility-administered medications for this visit.    Medication Side Effects: None  Allergies: No Known Allergies  Past  Medical History:  Diagnosis Date   Anxiety    Bipolar 1 disorder (HCC)    Depression    Mania (HCC)    Suicidal ideations     Past Medical History, Surgical history, Social history, and Family history were reviewed and updated as appropriate.   Please see review of systems for further details on the patient's review from today.   Objective:   Physical Exam:  There were no vitals taken for this visit.  Physical Exam Constitutional:      General: He is not in acute distress. Musculoskeletal:        General: No deformity.  Neurological:     Mental Status: He is alert and  oriented to person, place, and time.     Coordination: Coordination normal.  Psychiatric:        Attention and Perception: Attention and perception normal. He does not perceive auditory or visual hallucinations.        Mood and Affect: Affect is not labile, blunt, angry or inappropriate.        Speech: Speech normal.        Behavior: Behavior normal.        Thought Content: Thought content normal. Thought content is not paranoid or delusional. Thought content does not include homicidal or suicidal ideation. Thought content does not include homicidal or suicidal plan.        Cognition and Memory: Cognition and memory normal.        Judgment: Judgment normal.     Comments: Insight intact     Lab Review:     Component Value Date/Time   NA 139 03/24/2023 1043   NA 141 12/25/2022 1303   K 4.0 03/24/2023 1043   CL 101 03/24/2023 1043   CO2 27 03/24/2023 1043   GLUCOSE 95 03/24/2023 1043   BUN 14 03/24/2023 1043   BUN 11 12/25/2022 1303   CREATININE 1.10 03/24/2023 1043   CALCIUM 9.8 03/24/2023 1043   PROT 7.6 03/24/2023 1043   PROT 7.0 12/25/2022 1303   ALBUMIN 4.7 03/24/2023 1043   ALBUMIN 4.6 12/25/2022 1303   AST 20 03/24/2023 1043   ALT 30 03/24/2023 1043   ALKPHOS 66 03/24/2023 1043   BILITOT 0.4 03/24/2023 1043   BILITOT 0.3 12/25/2022 1303   GFRNONAA >60 05/16/2020 0912   GFRAA >60 05/16/2020 0912       Component Value Date/Time   WBC 11.7 (H) 03/24/2023 1043   RBC 5.10 03/24/2023 1043   HGB 15.5 03/24/2023 1043   HGB 15.4 12/25/2022 1303   HCT 46.3 03/24/2023 1043   HCT 44.4 12/25/2022 1303   PLT 339.0 03/24/2023 1043   PLT 336 12/25/2022 1303   MCV 90.8 03/24/2023 1043   MCV 92 12/25/2022 1303   MCH 31.9 12/25/2022 1303   MCH 29.9 05/16/2020 0912   MCHC 33.4 03/24/2023 1043   RDW 13.1 03/24/2023 1043   RDW 12.4 12/25/2022 1303   LYMPHSABS 4.5 (H) 03/24/2023 1043   LYMPHSABS 3.3 (H) 12/25/2022 1303   MONOABS 0.7 03/24/2023 1043   EOSABS 0.2 03/24/2023  1043   EOSABS 0.1 12/25/2022 1303   BASOSABS 0.1 03/24/2023 1043   BASOSABS 0.1 12/25/2022 1303    No results found for: "POCLITH", "LITHIUM"   No results found for: "PHENYTOIN", "PHENOBARB", "VALPROATE", "CBMZ"   .res Assessment: Plan:    Plan:  Perphenazine 6mg  in am and 6mg  at bedtime   Wellbutrin XL 300mg  daily Concerta 36mg  twice daily.   Monitor BP between  visits while taking stimulant medication.  RTC 4 weeks  Therapist - [name]   Discussed potential benefits, risks, and side effects of stimulants with patient to include increased heart rate, palpitations, insomnia, increased anxiety, increased irritability, or decreased appetite. Also discussed history of paranoia and to watch for any change in moods. Will also discuss changes with parents. Instructed patient to contact office if experiencing any significant tolerability issues.   Patient advised to contact office with any questions, adverse effects, or acute worsening in signs and symptoms.  Discussed potential metabolic side effects associated with atypical antipsychotics, as well as potential risk for movement side effects. Advised pt to contact office if movement side effects occur.   Diagnoses and all orders for this visit:  Major depressive disorder, recurrent episode, moderate degree (HCC) -     perphenazine (TRILAFON) 2 MG tablet; Take 1 tablet (2 mg) by mouth with a 4 mg tablet twice a day (total of 6 mg bid) -     perphenazine (TRILAFON) 4 MG tablet; Take 1 tablet (4 mg) by mouth with a 2 mg tablet twice a day. (6 mg bid)  Insomnia, unspecified type -     buPROPion (WELLBUTRIN XL) 300 MG 24 hr tablet; Take 1 tablet (300 mg total) by mouth daily.  ADHD, predominantly inattentive type -     methylphenidate (CONCERTA) 36 MG PO CR tablet; Take one tablet in the morning and one tablet at lunch.  GAD (generalized anxiety disorder)     Please see After Visit Summary for patient specific  instructions.  No future appointments.   No orders of the defined types were placed in this encounter.   -------------------------------

## 2023-10-09 ENCOUNTER — Ambulatory Visit (INDEPENDENT_AMBULATORY_CARE_PROVIDER_SITE_OTHER): Payer: 59 | Admitting: Adult Health

## 2023-10-09 ENCOUNTER — Encounter: Payer: Self-pay | Admitting: Adult Health

## 2023-10-09 DIAGNOSIS — F900 Attention-deficit hyperactivity disorder, predominantly inattentive type: Secondary | ICD-10-CM | POA: Diagnosis not present

## 2023-10-09 DIAGNOSIS — F411 Generalized anxiety disorder: Secondary | ICD-10-CM

## 2023-10-09 DIAGNOSIS — F22 Delusional disorders: Secondary | ICD-10-CM

## 2023-10-09 DIAGNOSIS — G4700 Insomnia, unspecified: Secondary | ICD-10-CM

## 2023-10-09 DIAGNOSIS — F331 Major depressive disorder, recurrent, moderate: Secondary | ICD-10-CM

## 2023-10-09 MED ORDER — METHYLPHENIDATE HCL ER (OSM) 36 MG PO TBCR: EXTENDED_RELEASE_TABLET | ORAL | 0 refills | Status: DC

## 2023-10-09 MED ORDER — BUPROPION HCL ER (XL) 300 MG PO TB24: 300.0000 mg | ORAL_TABLET | Freq: Every day | ORAL | 5 refills | Status: DC

## 2023-10-09 NOTE — Progress Notes (Signed)
Adam Larsen [id number] 1986/03/28 37 y.o.  Subjective:   Patient ID:  Adam Larsen. is a 37 y.o. (DOB Jul 16, 1986) male.  Chief Complaint: No chief complaint on file.   HPI Adam Larsen. presents to the office today for follow-up of MDD, GAD, insomnia, paranoia, and ADHD.  Describes mood today as "ok". Pleasant. Denies tearfulness. Mood symptoms - denies depression and and irritability. Reports some anxiety. Denies panic attacks. Reports worry, rumination, and over thinking. Reports stopping the Wellbutrin for 2 weeks and reports mood instability and psychosis for 4 to 5 days. Reports he was able to realize the effects of leaving off the medication and has restarted it. Reports mood has improved. Stating "I feel like I'm on the other side of things again". Improved interest and motivation. Taking medications as prescribed.  Energy levels stable. Active, riding bike most days.   Enjoys some usual interests and activities. Single. Lives with parents. Spending time with family.  Appetite adequate. Reports weight loss - 220 pounds. Sleeps well most nights. Averages 5 to 6 hours a night. Report some daytime napping - 1 hours. Focus and concentration stable. Diagnosed with ADHD in childhood. Completing tasks around the house. Looking for employment. Denies SI or HI.   Denies AH or VH.  Denies paranoia. Denies self harm. Denies substance use.  Previous medication: [name]   PHQ2-9    Flowsheet Row Office Visit from 03/24/2023 in [facility] [place] ED from 05/16/2020 in [facility] Emergency Department at [facility]  PHQ-2 Total Score 0 6  PHQ-9 Total Score 0 17      Flowsheet Row ED from 01/24/2023 in [facility] at [facility]) ED from 12/25/2022 in [facility] at [facility]) ED from 04/19/2020 in [facility] Emergency Department at [facility]  C-SSRS RISK CATEGORY  No Risk No Risk Error: Q2 is Yes, you must answer 3, 4, and 5        Review of Systems:  Review of Systems  Musculoskeletal:  Negative for gait problem.  Neurological:  Negative for tremors.  Psychiatric/Behavioral:         Please refer to HPI    Medications: I have reviewed the patient's current medications.  Current Outpatient Medications  Medication Sig Dispense Refill   Ascorbic Acid (VITAMIN C) 1000 MG tablet Take 2,000 mg by mouth daily.     buPROPion (WELLBUTRIN XL) 300 MG 24 hr tablet Take 1 tablet (300 mg total) by mouth daily. 30 tablet 5   cholecalciferol (VITAMIN D3) 25 MCG (1000 UNIT) tablet Take 2,000 Units by mouth daily.     loratadine (CLARITIN) 10 MG tablet Take 1 tablet (10 mg total) by mouth daily. 30 tablet 0   methylphenidate (CONCERTA) 36 MG PO CR tablet Take one tablet in the morning and one tablet at lunch. 60 tablet 0   Multiple Vitamin (MULTIVITAMIN) capsule Take 1 capsule by mouth daily.     omega-3 acid ethyl esters (LOVAZA) 1 g capsule Take 1 capsule (1 g total) by mouth 2 (two) times daily. 60 capsule 0   Omega-3 Fatty Acids (FISH OIL) 1000 MG CAPS Take 1,000 mg by mouth daily.     perphenazine (TRILAFON) 2 MG tablet Take 1 tablet (2 mg) by mouth with a 4 mg tablet twice a day (total of 6 mg bid) 60 tablet 5   perphenazine (TRILAFON) 4 MG tablet Take 1 tablet (4 mg) by  mouth with a 2 mg tablet twice a day. (6 mg bid) 60 tablet 5   No current facility-administered medications for this visit.    Medication Side Effects: None  Allergies: No Known Allergies  Past Medical History:  Diagnosis Date   Anxiety    Bipolar 1 disorder (HCC)    Depression    Mania (HCC)    Suicidal ideations     Past Medical History, Surgical history, Social history, and Family history were reviewed and updated as appropriate.   Please see review of systems for further details on the patient's review from today.   Objective:   Physical Exam:  There were no vitals  taken for this visit.  Physical Exam Constitutional:      General: He is not in acute distress. Musculoskeletal:        General: No deformity.  Neurological:     Mental Status: He is alert and oriented to person, place, and time.     Coordination: Coordination normal.  Psychiatric:        Attention and Perception: Attention and perception normal. He does not perceive auditory or visual hallucinations.        Mood and Affect: Mood normal. Mood is not anxious or depressed. Affect is not labile, blunt, angry or inappropriate.        Speech: Speech normal.        Behavior: Behavior normal.        Thought Content: Thought content normal. Thought content is not paranoid or delusional. Thought content does not include homicidal or suicidal ideation. Thought content does not include homicidal or suicidal plan.        Cognition and Memory: Cognition and memory normal.        Judgment: Judgment normal.     Comments: Insight intact     Lab Review:     Component Value Date/Time   NA 139 03/24/2023 1043   NA 141 12/25/2022 1303   K 4.0 03/24/2023 1043   CL 101 03/24/2023 1043   CO2 27 03/24/2023 1043   GLUCOSE 95 03/24/2023 1043   BUN 14 03/24/2023 1043   BUN 11 12/25/2022 1303   CREATININE 1.10 03/24/2023 1043   CALCIUM 9.8 03/24/2023 1043   PROT 7.6 03/24/2023 1043   PROT 7.0 12/25/2022 1303   ALBUMIN 4.7 03/24/2023 1043   ALBUMIN 4.6 12/25/2022 1303   AST 20 03/24/2023 1043   ALT 30 03/24/2023 1043   ALKPHOS 66 03/24/2023 1043   BILITOT 0.4 03/24/2023 1043   BILITOT 0.3 12/25/2022 1303   GFRNONAA >60 05/16/2020 0912   GFRAA >60 05/16/2020 0912       Component Value Date/Time   WBC 11.7 (H) 03/24/2023 1043   RBC 5.10 03/24/2023 1043   HGB 15.5 03/24/2023 1043   HGB 15.4 12/25/2022 1303   HCT 46.3 03/24/2023 1043   HCT 44.4 12/25/2022 1303   PLT 339.0 03/24/2023 1043   PLT 336 12/25/2022 1303   MCV 90.8 03/24/2023 1043   MCV 92 12/25/2022 1303   MCH 31.9 12/25/2022  1303   MCH 29.9 05/16/2020 0912   MCHC 33.4 03/24/2023 1043   RDW 13.1 03/24/2023 1043   RDW 12.4 12/25/2022 1303   LYMPHSABS 4.5 (H) 03/24/2023 1043   LYMPHSABS 3.3 (H) 12/25/2022 1303   MONOABS 0.7 03/24/2023 1043   EOSABS 0.2 03/24/2023 1043   EOSABS 0.1 12/25/2022 1303   BASOSABS 0.1 03/24/2023 1043   BASOSABS 0.1 12/25/2022 1303    No results found  for: "POCLITH", "LITHIUM"   No results found for: "PHENYTOIN", "PHENOBARB", "VALPROATE", "CBMZ"   .res Assessment: Plan:    Plan:  Perphenazine 6mg  in am and 6mg  at bedtime   Wellbutrin XL 300mg  daily Concerta 36mg  twice daily.   Monitor BP between visits while taking stimulant medication.  RTC 4 weeks  Therapist - [name]   Discussed potential benefits, risks, and side effects of stimulants with patient to include increased heart rate, palpitations, insomnia, increased anxiety, increased irritability, or decreased appetite. Also discussed history of paranoia and to watch for any change in moods. Will also discuss changes with parents. Instructed patient to contact office if experiencing any significant tolerability issues.   Patient advised to contact office with any questions, adverse effects, or acute worsening in signs and symptoms.  Discussed potential metabolic side effects associated with atypical antipsychotics, as well as potential risk for movement side effects. Advised pt to contact office if movement side effects occur.  Diagnoses and all orders for this visit:  Major depressive disorder, recurrent episode, moderate degree (HCC)  Insomnia, unspecified type -     buPROPion (WELLBUTRIN XL) 300 MG 24 hr tablet; Take 1 tablet (300 mg total) by mouth daily.  ADHD, predominantly inattentive type -     methylphenidate (CONCERTA) 36 MG PO CR tablet; Take one tablet in the morning and one tablet at lunch.  Paranoia (HCC)  GAD (generalized anxiety disorder)     Please see After Visit Summary for patient  specific instructions.  No future appointments.   No orders of the defined types were placed in this encounter.   -------------------------------

## 2023-11-03 ENCOUNTER — Telehealth: Payer: Self-pay | Admitting: Adult Health

## 2023-11-03 NOTE — Telephone Encounter (Signed)
 LF 12/19, due 1/16

## 2023-11-03 NOTE — Telephone Encounter (Signed)
 Pt lvm asking for a refill on his concerta 36 mg. Pharmacy is [facility] on [address]. Next appt 1/22

## 2023-11-05 ENCOUNTER — Telehealth: Payer: Self-pay | Admitting: Adult Health

## 2023-11-05 NOTE — Telephone Encounter (Signed)
 LVM to [facility] - wanted to confirm that he is not due for a RF on Concerta  until tomorrow and Rx will be sent then.

## 2023-11-05 NOTE — Telephone Encounter (Signed)
 Pt LVM @ 4:37p on 1/14 upset because we filled 3 out of 4 of his meds, the missing one being Concerta .  He said this wasn't the first time we had done that.  I called him back and LVM advising him the 4th medication was a controlled substance and can only be refilled every 28-30 days and that based on his last refill, the next date would be 1/16.  Next appt 1/22

## 2023-11-06 ENCOUNTER — Other Ambulatory Visit: Payer: Self-pay

## 2023-11-06 DIAGNOSIS — F900 Attention-deficit hyperactivity disorder, predominantly inattentive type: Secondary | ICD-10-CM

## 2023-11-06 MED ORDER — METHYLPHENIDATE HCL ER (OSM) 36 MG PO TBCR: EXTENDED_RELEASE_TABLET | ORAL | 0 refills | Status: DC

## 2023-11-06 NOTE — Telephone Encounter (Signed)
Pended Concerta 36 mg, #60, to [facility] on [address].

## 2023-11-12 ENCOUNTER — Encounter: Payer: Self-pay | Admitting: Adult Health

## 2023-11-12 ENCOUNTER — Ambulatory Visit (INDEPENDENT_AMBULATORY_CARE_PROVIDER_SITE_OTHER): Payer: 59 | Admitting: Adult Health

## 2023-11-12 DIAGNOSIS — F331 Major depressive disorder, recurrent, moderate: Secondary | ICD-10-CM | POA: Diagnosis not present

## 2023-11-12 DIAGNOSIS — F411 Generalized anxiety disorder: Secondary | ICD-10-CM

## 2023-11-12 DIAGNOSIS — F22 Delusional disorders: Secondary | ICD-10-CM | POA: Diagnosis not present

## 2023-11-12 DIAGNOSIS — F900 Attention-deficit hyperactivity disorder, predominantly inattentive type: Secondary | ICD-10-CM

## 2023-11-12 DIAGNOSIS — G4700 Insomnia, unspecified: Secondary | ICD-10-CM

## 2023-11-12 MED ORDER — PERPHENAZINE 4 MG PO TABS: ORAL_TABLET | ORAL | 5 refills | Status: DC

## 2023-11-12 NOTE — Progress Notes (Signed)
Adam Larsen [id number] 02-Nov-1985 38 y.o.  Subjective:   Patient ID:  Adam Larsen. is a 38 y.o. (DOB [birth year]) male.  Chief Complaint: No chief complaint on file.   HPI Adam Larsen. presents to the office today for follow-up of MDD, GAD, insomnia, paranoia, and ADHD.  Describes mood today as "ok". Pleasant. Denies tearfulness. Mood symptoms - denies depression and and irritability. Reports some anxiety. Denies panic attacks. Reports worry, rumination, and over thinking. Reports mood as neutral. Stating "I feel like I'm doing a lot better". Improved interest and motivation. Taking medications as prescribed.  Energy levels stable. Active, exercising some days. Enjoys some usual interests and activities. Single. Lives with parents. Spending time with family.  Appetite adequate. Reports weight gain - 220 to 225 pounds. Sleeps well most nights. Averages 5 to 6 hours a night. Report some daytime napping - 1.5 hours. Focus and concentration stable. Diagnosed with ADHD in childhood. Completing tasks around the house. Looking for employment. Denies SI or HI.   Denies AH or VH.  Denies paranoia. Denies self harm. Denies substance use.  Previous medication: [name]  PHQ2-9    Flowsheet Row Office Visit from 03/24/2023 in [facility] [place] ED from 05/16/2020 in [facility] Emergency Department at [facility]  PHQ-2 Total Score 0 6  PHQ-9 Total Score 0 17      Flowsheet Row ED from 01/24/2023 in [facility] ED from 12[facility]25/2022 in [facility] ED from 04/19/2020 in [facility] Emergency Department at [facility]  C-SSRS RISK CATEGORY No Risk No Risk Error: Q2 is Yes, you must answer 3, 4, and 5       Review of Systems:  Review of Systems  Musculoskeletal:  Negative for gait problem.  Neurological:  Negative for tremors.   Psychiatric/Behavioral:         Please refer to HPI    Medications: I have reviewed the patient's current medications.  Current Outpatient Medications  Medication Sig Dispense Refill   Ascorbic Acid (VITAMIN C) 1000 MG tablet Take 2,000 mg by mouth daily.     buPROPion (WELLBUTRIN XL) 300 MG 24 hr tablet Take 1 tablet (300 mg total) by mouth daily. 30 tablet 5   cholecalciferol (VITAMIN D3) 25 MCG (1000 UNIT) tablet Take 2,000 Units by mouth daily.     loratadine (CLARITIN) 10 MG tablet Take 1 tablet (10 mg total) by mouth daily. 30 tablet 0   methylphenidate (CONCERTA) 36 MG PO CR tablet Take one tablet in the morning and one tablet at lunch. 60 tablet 0   Multiple Vitamin (MULTIVITAMIN) capsule Take 1 capsule by mouth daily.     omega-3 acid ethyl esters (LOVAZA) 1 g capsule Take 1 capsule (1 g total) by mouth 2 (two) times daily. 60 capsule 0   Omega-3 Fatty Acids (FISH OIL) 1000 MG CAPS Take 1,000 mg by mouth daily.     perphenazine (TRILAFON) 2 MG tablet Take 1 tablet (2 mg) by mouth with a 4 mg tablet twice a day (total of 6 mg bid) 60 tablet 5   perphenazine (TRILAFON) 4 MG tablet Take 1 tablet (4 mg) by mouth with a 2 mg tablet twice a day. (6 mg bid) 60 tablet 5   No current facility-administered medications for this visit.    Medication Side Effects: None  Allergies: No Known Allergies  Past Medical History:  Diagnosis Date   Anxiety    Bipolar 1 disorder (HCC)    Depression    Mania (HCC)    Suicidal ideations     Past Medical History, Surgical history, Social history, and Family history were reviewed and updated as appropriate.   Please see review of systems for further details on the patient's review from today.   Objective:   Physical Exam:  There were no vitals taken for this visit.  Physical Exam Constitutional:      General: He is not in acute distress. Musculoskeletal:        General: No deformity.  Neurological:     Mental Status: He is alert and  oriented to person, place, and time.     Coordination: Coordination normal.  Psychiatric:        Attention and Perception: Attention and perception normal. He does not perceive auditory or visual hallucinations.        Mood and Affect: Mood normal. Mood is not anxious or depressed. Affect is not labile, blunt, angry or inappropriate.        Speech: Speech normal.        Behavior: Behavior normal.        Thought Content: Thought content normal. Thought content is not paranoid or delusional. Thought content does not include homicidal or suicidal ideation. Thought content does not include homicidal or suicidal plan.        Cognition and Memory: Cognition and memory normal.        Judgment: Judgment normal.     Comments: Insight intact     Lab Review:     Component Value Date/Time   NA 139 03/24/2023 1043   NA 141 12/25/2022 1303   K 4.0 03/24/2023 1043   CL 101 03/24/2023 1043   CO2 27 03/24/2023 1043   GLUCOSE 95 03/24/2023 1043   BUN 14 03/24/2023 1043   BUN 11 12/25/2022 1303   CREATININE 1.10 03/24/2023 1043   CALCIUM 9.8 03/24/2023 1043   PROT 7.6 03/24/2023 1043   PROT 7.0 12/25/2022 1303   ALBUMIN 4.7 03/24/2023 1043   ALBUMIN 4.6 12/25/2022 1303   AST 20 03/24/2023 1043   ALT 30 03/24/2023 1043   ALKPHOS 66 03/24/2023 1043   BILITOT 0.4 03/24/2023 1043   BILITOT 0.3 12/25/2022 1303   GFRNONAA >60 05/16/2020 0912   GFRAA >60 05/16/2020 0912       Component Value Date/Time   WBC 11.7 (H) 03/24/2023 1043   RBC 5.10 03/24/2023 1043   HGB 15.5 03/24/2023 1043   HGB 15.4 12/25/2022 1303   HCT 46.3 03/24/2023 1043   HCT 44.4 12/25/2022 1303   PLT 339.0 03/24/2023 1043   PLT 336 12/25/2022 1303   MCV 90.8 03/24/2023 1043   MCV 92 12/25/2022 1303   MCH 31.9 12/25/2022 1303   MCH 29.9 05/16/2020 0912   MCHC 33.4 03/24/2023 1043   RDW 13.1 03/24/2023 1043   RDW 12.4 12/25/2022 1303   LYMPHSABS 4.5 (H) 03/24/2023 1043   LYMPHSABS 3.3 (H) 12/25/2022 1303   MONOABS  0.7 03/24/2023 1043   EOSABS 0.2 03/24/2023 1043   EOSABS 0.1 12/25/2022 1303   BASOSABS 0.1 03/24/2023 1043   BASOSABS 0.1 12/25/2022 1303    No results found for: "POCLITH", "LITHIUM"   No results found for: "PHENYTOIN", "PHENOBARB", "VALPROATE", "CBMZ"   .res Assessment: Plan:    Plan:  Perphenazine 6mg  in am and 9mg  at bedtime ( increased between visits bc he wasn't doing to well)  Wellbutrin XL 300mg  daily Concerta 36mg  twice daily.   Monitor BP between visits while taking stimulant medication.  RTC 4 weeks  Therapist - [name]   Discussed potential benefits, risks, and side effects of stimulants with patient to include increased heart rate, palpitations, insomnia, increased anxiety, increased irritability, or decreased appetite. Also discussed history of paranoia and to watch for any change in moods. Will also discuss changes with parents. Instructed patient to contact office if experiencing any significant tolerability issues.   Patient advised to contact office with any questions, adverse effects, or acute worsening in signs and symptoms.  Discussed potential metabolic side effects associated with atypical antipsychotics, as well as potential risk for movement side effects. Advised pt to contact office if movement side effects occur.   There are no diagnoses linked to this encounter.   Please see After Visit Summary for patient specific instructions.  No future appointments.  No orders of the defined types were placed in this encounter.   -------------------------------

## 2023-12-09 ENCOUNTER — Encounter: Payer: Self-pay | Admitting: Adult Health

## 2023-12-09 ENCOUNTER — Ambulatory Visit (INDEPENDENT_AMBULATORY_CARE_PROVIDER_SITE_OTHER): Payer: 59 | Admitting: Adult Health

## 2023-12-09 DIAGNOSIS — F22 Delusional disorders: Secondary | ICD-10-CM | POA: Diagnosis not present

## 2023-12-09 DIAGNOSIS — F900 Attention-deficit hyperactivity disorder, predominantly inattentive type: Secondary | ICD-10-CM

## 2023-12-09 DIAGNOSIS — F331 Major depressive disorder, recurrent, moderate: Secondary | ICD-10-CM | POA: Diagnosis not present

## 2023-12-09 DIAGNOSIS — G4700 Insomnia, unspecified: Secondary | ICD-10-CM | POA: Diagnosis not present

## 2023-12-09 MED ORDER — METHYLPHENIDATE HCL ER (OSM) 36 MG PO TBCR: EXTENDED_RELEASE_TABLET | ORAL | 0 refills | Status: DC

## 2023-12-09 NOTE — Progress Notes (Signed)
Adam Larsen [id number] [birth year] 38 y.o.  Subjective:   Patient ID:  Adam Wildes. is a 38 y.o. (DOB 09-22-1986) male.  Chief Complaint: No chief complaint on file.   HPI Adam Larsen. presents to the office today for follow-up of MDD, GAD, insomnia, paranoia, and ADHD.  Describes mood today as "ok". Pleasant. Denies tearfulness. Mood symptoms - denies depression, anxiety and irritability. Reports varying interest and motivation. Denies panic attacks. Denies worry, rumination, and over thinking. Reports mood as "pretty flat". Stating "I feel like I'm doing ok". Taking medications as prescribed.  Energy levels stable. Active, walking.  Enjoys some usual interests and activities. Single. Lives with parents. Spending time with family.  Appetite adequate. Reports weight gain - 220 to 225 pounds. Sleeps well most nights. Averages 5 to 6 hours a night. Report some daytime napping - 1.5 hours. Focus and concentration stable. Diagnosed with ADHD in childhood. Completing tasks around the house. Looking for employment - applied for a job at [employer]. Denies SI or HI.   Denies AH or VH.  Denies paranoia. Denies self harm. Denies substance use.  Previous medication: [name]   PHQ2-9    Flowsheet Row Office Visit from 03/24/2023 in [facility] [place] ED from 05/16/2020 in [facility] Emergency Department at [facility]  PHQ-2 Total Score 0 6  PHQ-9 Total Score 0 17      Flowsheet Row ED from 01/24/2023 in [facility] ED from 12/25/2022 in [facility] at [facility]) ED from 04/19/2020 in [facility] Emergency Department at [facility]  C-SSRS RISK CATEGORY No Risk No Risk Error: Q2 is Yes, you must answer 3, 4, and 5        Review of Systems:  Review of Systems  Musculoskeletal:  Negative for gait problem.  Neurological:  Negative for tremors.   Psychiatric/Behavioral:         Please refer to HPI    Medications: I have reviewed the patient's current medications.  Current Outpatient Medications  Medication Sig Dispense Refill   Ascorbic Acid (VITAMIN C) 1000 MG tablet Take 2,000 mg by mouth daily.     buPROPion (WELLBUTRIN XL) 300 MG 24 hr tablet Take 1 tablet (300 mg total) by mouth daily. 30 tablet 5   cholecalciferol (VITAMIN D3) 25 MCG (1000 UNIT) tablet Take 2,000 Units by mouth daily.     loratadine (CLARITIN) 10 MG tablet Take 1 tablet (10 mg total) by mouth daily. 30 tablet 0   methylphenidate (CONCERTA) 36 MG PO CR tablet Take one tablet in the morning and one tablet at lunch. 60 tablet 0   Multiple Vitamin (MULTIVITAMIN) capsule Take 1 capsule by mouth daily.     omega-3 acid ethyl esters (LOVAZA) 1 g capsule Take 1 capsule (1 g total) by mouth 2 (two) times daily. 60 capsule 0   Omega-3 Fatty Acids (FISH OIL) 1000 MG CAPS Take 1,000 mg by mouth daily.     perphenazine (TRILAFON) 2 MG tablet Take 1 tablet (2 mg) by mouth with a 4 mg tablet twice a day (total of 6 mg bid) 60 tablet 5   perphenazine (TRILAFON) 4 MG tablet Take 1 tablet (4 mg) by mouth with a 2 mg tablet twice a day. (6 mg bid) 60 tablet 5   No current facility-administered medications for this visit.    Medication Side Effects: None  Allergies: No Known Allergies  Past Medical History:  Diagnosis Date   Anxiety    Bipolar 1 disorder (HCC)    Depression    Mania (HCC)    Suicidal ideations     Past Medical History, Surgical history, Social history, and Family history were reviewed and updated as appropriate.   Please see review of systems for further details on the patient's review from today.   Objective:   Physical Exam:  There were no vitals taken for this visit.  Physical Exam Constitutional:      General: He is not in acute distress. Musculoskeletal:        General: No deformity.  Neurological:     Mental Status: He is alert and  oriented to person, place, and time.     Coordination: Coordination normal.  Psychiatric:        Attention and Perception: Attention and perception normal. He does not perceive auditory or visual hallucinations.        Mood and Affect: Affect is not labile, blunt, angry or inappropriate.        Speech: Speech normal.        Behavior: Behavior normal.        Thought Content: Thought content normal. Thought content is not paranoid or delusional. Thought content does not include homicidal or suicidal ideation. Thought content does not include homicidal or suicidal plan.        Cognition and Memory: Cognition and memory normal.        Judgment: Judgment normal.     Comments: Insight intact     Lab Review:     Component Value Date/Time   NA 139 03/24/2023 1043   NA 141 12/25/2022 1303   K 4.0 03/24/2023 1043   CL 101 03/24/2023 1043   CO2 27 03/24/2023 1043   GLUCOSE 95 03/24/2023 1043   BUN 14 03/24/2023 1043   BUN 11 12/25/2022 1303   CREATININE 1.10 03/24/2023 1043   CALCIUM 9.8 03/24/2023 1043   PROT 7.6 03/24/2023 1043   PROT 7.0 12/25/2022 1303   ALBUMIN 4.7 03/24/2023 1043   ALBUMIN 4.6 12/25/2022 1303   AST 20 03/24/2023 1043   ALT 30 03/24/2023 1043   ALKPHOS 66 03/24/2023 1043   BILITOT 0.4 03/24/2023 1043   BILITOT 0.3 12/25/2022 1303   GFRNONAA >60 05/16/2020 0912   GFRAA >60 05/16/2020 0912       Component Value Date/Time   WBC 11.7 (H) 03/24/2023 1043   RBC 5.10 03/24/2023 1043   HGB 15.5 03/24/2023 1043   HGB 15.4 12/25/2022 1303   HCT 46.3 03/24/2023 1043   HCT 44.4 12/25/2022 1303   PLT 339.0 03/24/2023 1043   PLT 336 12/25/2022 1303   MCV 90.8 03/24/2023 1043   MCV 92 12/25/2022 1303   MCH 31.9 12/25/2022 1303   MCH 29.9 05/16/2020 0912   MCHC 33.4 03/24/2023 1043   RDW 13.1 03/24/2023 1043   RDW 12.4 12/25/2022 1303   LYMPHSABS 4.5 (H) 03/24/2023 1043   LYMPHSABS 3.3 (H) 12/25/2022 1303   MONOABS 0.7 03/24/2023 1043   EOSABS 0.2 03/24/2023  1043   EOSABS 0.1 12/25/2022 1303   BASOSABS 0.1 03/24/2023 1043   BASOSABS 0.1 12/25/2022 1303    No results found for: "POCLITH", "LITHIUM"   No results found for: "PHENYTOIN", "PHENOBARB", "VALPROATE", "CBMZ"   .res Assessment: Plan:    Plan:  Perphenazine 6mg  in am and 9mg  at bedtime ( increased between visits bc he wasn't doing to well) Wellbutrin XL 300mg  daily  Concerta 36mg  twice daily.   Monitor BP between visits while taking stimulant medication.  RTC 4 weeks  Therapist - [name]   Discussed potential benefits, risks, and side effects of stimulants with patient to include increased heart rate, palpitations, insomnia, increased anxiety, increased irritability, or decreased appetite. Also discussed history of paranoia and to watch for any change in moods. Will also discuss changes with parents. Instructed patient to contact office if experiencing any significant tolerability issues.   Patient advised to contact office with any questions, adverse effects, or acute worsening in signs and symptoms.  Discussed potential metabolic side effects associated with atypical antipsychotics, as well as potential risk for movement side effects. Advised pt to contact office if movement side effects occur.   There are no diagnoses linked to this encounter.   Please see After Visit Summary for patient specific instructions.  No future appointments.  No orders of the defined types were placed in this encounter.   -------------------------------

## 2024-01-02 ENCOUNTER — Encounter: Payer: Self-pay | Admitting: Adult Health

## 2024-01-02 ENCOUNTER — Telehealth (INDEPENDENT_AMBULATORY_CARE_PROVIDER_SITE_OTHER): Payer: 59 | Admitting: Adult Health

## 2024-01-02 DIAGNOSIS — F900 Attention-deficit hyperactivity disorder, predominantly inattentive type: Secondary | ICD-10-CM

## 2024-01-02 DIAGNOSIS — G4700 Insomnia, unspecified: Secondary | ICD-10-CM | POA: Diagnosis not present

## 2024-01-02 DIAGNOSIS — F331 Major depressive disorder, recurrent, moderate: Secondary | ICD-10-CM | POA: Diagnosis not present

## 2024-01-02 DIAGNOSIS — F411 Generalized anxiety disorder: Secondary | ICD-10-CM

## 2024-01-02 DIAGNOSIS — F22 Delusional disorders: Secondary | ICD-10-CM | POA: Diagnosis not present

## 2024-01-02 MED ORDER — METHYLPHENIDATE HCL ER (OSM) 36 MG PO TBCR: 36.0000 mg | EXTENDED_RELEASE_TABLET | Freq: Every day | ORAL | 0 refills | Status: DC

## 2024-01-02 MED ORDER — PERPHENAZINE 2 MG PO TABS: ORAL_TABLET | ORAL | 5 refills | Status: DC

## 2024-01-02 MED ORDER — METHYLPHENIDATE HCL ER (OSM) 36 MG PO TBCR: EXTENDED_RELEASE_TABLET | ORAL | 0 refills | Status: DC

## 2024-01-02 NOTE — Progress Notes (Signed)
 Adam Larsen [id number] 1986/06/03 38 y.o.  Virtual Visit via Video Note  I connected with pt @ on 01/02/24 at  8:00 AM EDT by a video enabled telemedicine application and verified that I am speaking with the correct person using two identifiers.   I discussed the limitations of evaluation and management by telemedicine and the availability of in person appointments. The patient expressed understanding and agreed to proceed.  I discussed the assessment and treatment plan with the patient. The patient was provided an opportunity to ask questions and all were answered. The patient agreed with the plan and demonstrated an understanding of the instructions.   The patient was advised to call back or seek an in-person evaluation if the symptoms worsen or if the condition fails to improve as anticipated.  I provided 25 minutes of non-face-to-face time during this encounter.  The patient was located at home.  The provider was located at [facility].   Dorothyann Gibbs, NP   Subjective:   Patient ID:  Adam Barren. is a 38 y.o. (DOB [birth year]) male.  Chief Complaint: No chief complaint on file.   HPI Adam Larsen. presents for follow-up of MDD, GAD, insomnia, paranoia, and ADHD.  Describes mood today as "ok". Pleasant. Denies tearfulness. Mood symptoms - denies depression, anxiety and irritability. Reports improved interest and motivation. Denies panic attacks. Denies worry, rumination, and over thinking. Reports mood as variable. Stating "I feel pretty flat all the time now". Taking medications as prescribed.  Energy levels stable. Active, plans to start walking.  Enjoys some usual interests and activities. Single. Lives with parents. Spending time with family. Has started going back to church.  Appetite adequate. Reports weight stable - 225 pounds. Sleeps well most nights. Averages 6 to 7 hours a night. Report some daytime napping - 1.5 hours. Focus and  concentration stable - "pretty good". Diagnosed with ADHD in childhood. Completing tasks around the house. Looking for employment. Denies SI or HI.   Denies AH or VH.  Denies paranoia. Denies self harm. Denies substance use.  Previous medication: Stratera, Lamictal  Review of Systems:  Review of Systems  Musculoskeletal:  Negative for gait problem.  Neurological:  Negative for tremors.  Psychiatric/Behavioral:         Please refer to HPI   Medications: I have reviewed the patient's current medications.  Current Outpatient Medications  Medication Sig Dispense Refill   Ascorbic Acid (VITAMIN C) 1000 MG tablet Take 2,000 mg by mouth daily.     buPROPion (WELLBUTRIN XL) 300 MG 24 hr tablet Take 1 tablet (300 mg total) by mouth daily. 30 tablet 5   cholecalciferol (VITAMIN D3) 25 MCG (1000 UNIT) tablet Take 2,000 Units by mouth daily.     loratadine (CLARITIN) 10 MG tablet Take 1 tablet (10 mg total) by mouth daily. 30 tablet 0   methylphenidate (CONCERTA) 36 MG PO CR tablet Take one tablet in the morning and one tablet at lunch. 60 tablet 0   Multiple Vitamin (MULTIVITAMIN) capsule Take 1 capsule by mouth daily.     omega-3 acid ethyl esters (LOVAZA) 1 g capsule Take 1 capsule (1 g total) by mouth 2 (two) times daily. 60 capsule 0   Omega-3 Fatty Acids (FISH OIL) 1000 MG CAPS Take 1,000 mg by mouth daily.     perphenazine (TRILAFON) 2 MG tablet Take 1 tablet (2 mg) by mouth with a 4 mg tablet twice a day (total of 6 mg bid) 60 tablet 5  perphenazine (TRILAFON) 4 MG tablet Take 1 tablet (4 mg) by mouth with a 2 mg tablet twice a day. (6 mg bid) 60 tablet 5   No current facility-administered medications for this visit.    Medication Side Effects: None  Allergies: No Known Allergies  Past Medical History:  Diagnosis Date   Anxiety    Bipolar 1 disorder (HCC)    Depression    Mania (HCC)    Suicidal ideations     Family History  Problem Relation Age of Onset   Schizophrenia  Paternal Grandmother     Social History   Socioeconomic History   Marital status: Single    Spouse name: Not on file   Number of children: 0   Years of education: 10 or 22   Highest education level: 11th grade  Occupational History   Not on file  Tobacco Use   Smoking status: Every Day    Types: E-cigarettes   Smokeless tobacco: Never  Vaping Use   Vaping status: Every Day   Substances: Nicotine, Flavoring, Nicotine-salt  Substance and Sexual Activity   Alcohol use: Never   Drug use: Never   Sexual activity: Not Currently  Other Topics Concern   Not on file  Social History Narrative   Pt lives with his parents. States he has been diagnosed with ADHD, dyslexia, dysgraphia and learning disabilities. Finished 10 th or 11 th grade but is working on his GED through an online high school program. Pt is currently unemployed.   Social Drivers of Corporate investment banker Strain: Low Risk  (01/09/2019)   Overall Financial Resource Strain (CARDIA)    Difficulty of Paying Living Expenses: Not hard at all  Food Insecurity: No Food Insecurity (01/09/2019)   Hunger Vital Sign    Worried About Running Out of Food in the Last Year: Never true    Ran Out of Food in the Last Year: Never true  Transportation Needs: No Transportation Needs (01/09/2019)   PRAPARE - Administrator, Civil Service (Medical): No    Lack of Transportation (Non-Medical): No  Physical Activity: Inactive (01/09/2019)   Exercise Vital Sign    Days of Exercise per Week: 0 days    Minutes of Exercise per Session: 0 min  Stress: Stress Concern Present (01/09/2019)   [employer] of Occupational Health - Occupational Stress Questionnaire    Feeling of Stress : Rather much  Social Connections: Not on file  Intimate Partner Violence: Not on file    Past Medical History, Surgical history, Social history, and Family history were reviewed and updated as appropriate.   Please see review of systems for  further details on the patient's review from today.   Objective:   Physical Exam:  There were no vitals taken for this visit.  Physical Exam Constitutional:      General: He is not in acute distress. Musculoskeletal:        General: No deformity.  Neurological:     Mental Status: He is alert and oriented to person, place, and time.     Coordination: Coordination normal.  Psychiatric:        Attention and Perception: Attention and perception normal. He does not perceive auditory or visual hallucinations.        Mood and Affect: Affect is not labile, blunt, angry or inappropriate.        Speech: Speech normal.        Behavior: Behavior normal.  Thought Content: Thought content normal. Thought content is not paranoid or delusional. Thought content does not include homicidal or suicidal ideation. Thought content does not include homicidal or suicidal plan.        Cognition and Memory: Cognition and memory normal.        Judgment: Judgment normal.     Comments: Insight intact     Lab Review:     Component Value Date/Time   NA 139 03/24/2023 1043   NA 141 12/25/2022 1303   K 4.0 03/24/2023 1043   CL 101 03/24/2023 1043   CO2 27 03/24/2023 1043   GLUCOSE 95 03/24/2023 1043   BUN 14 03/24/2023 1043   BUN 11 12/25/2022 1303   CREATININE 1.10 03/24/2023 1043   CALCIUM 9.8 03/24/2023 1043   PROT 7.6 03/24/2023 1043   PROT 7.0 12/25/2022 1303   ALBUMIN 4.7 03/24/2023 1043   ALBUMIN 4.6 12/25/2022 1303   AST 20 03/24/2023 1043   ALT 30 03/24/2023 1043   ALKPHOS 66 03/24/2023 1043   BILITOT 0.4 03/24/2023 1043   BILITOT 0.3 12/25/2022 1303   GFRNONAA >60 05/16/2020 0912   GFRAA >60 05/16/2020 0912       Component Value Date/Time   WBC 11.7 (H) 03/24/2023 1043   RBC 5.10 03/24/2023 1043   HGB 15.5 03/24/2023 1043   HGB 15.4 12/25/2022 1303   HCT 46.3 03/24/2023 1043   HCT 44.4 12/25/2022 1303   PLT 339.0 03/24/2023 1043   PLT 336 12/25/2022 1303   MCV 90.8  03/24/2023 1043   MCV 92 12/25/2022 1303   MCH 31.9 12/25/2022 1303   MCH 29.9 05/16/2020 0912   MCHC 33.4 03/24/2023 1043   RDW 13.1 03/24/2023 1043   RDW 12.4 12/25/2022 1303   LYMPHSABS 4.5 (H) 03/24/2023 1043   LYMPHSABS 3.3 (H) 12/25/2022 1303   MONOABS 0.7 03/24/2023 1043   EOSABS 0.2 03/24/2023 1043   EOSABS 0.1 12/25/2022 1303   BASOSABS 0.1 03/24/2023 1043   BASOSABS 0.1 12/25/2022 1303    No results found for: "POCLITH", "LITHIUM"   No results found for: "PHENYTOIN", "PHENOBARB", "VALPROATE", "CBMZ"   .res Assessment: Plan:    Plan:  Perphenazine 6mg  in am and 9mg  at bedtime -  (increased between visits bc he wasn't doing to well) Wellbutrin XL 300mg  daily Concerta 36mg  twice daily.   Monitor BP between visits while taking stimulant medication.  RTC 4 weeks  15 minutes spent dedicated to the care of this patient on the date of this encounter to include pre-visit review of records, ordering of medication, post visit documentation, and face-to-face time with the patient discussing MDD, GAD, insomnia, paranoia, and ADHD. Discussed continuing current medication regimen.  Therapist - [name]   Discussed potential benefits, risks, and side effects of stimulants with patient to include increased heart rate, palpitations, insomnia, increased anxiety, increased irritability, or decreased appetite. Also discussed history of paranoia and to watch for any change in moods. Will also discuss changes with parents. Instructed patient to contact office if experiencing any significant tolerability issues.   Patient advised to contact office with any questions, adverse effects, or acute worsening in signs and symptoms.  Discussed potential metabolic side effects associated with atypical antipsychotics, as well as potential risk for movement side effects. Advised pt to contact office if movement side effects occur.   Diagnoses and all orders for this visit:  Major  depressive disorder, recurrent episode, moderate degree (HCC) -     perphenazine (TRILAFON) 2 MG  tablet; Take 1 tablet (2 mg) by mouth with a 4 mg tablet twice a day (total of 6 mg bid)  GAD (generalized anxiety disorder)  ADHD, predominantly inattentive type -     methylphenidate (CONCERTA) 36 MG PO CR tablet; Take one tablet in the morning and one tablet at lunch.  Insomnia, unspecified type  Paranoia (HCC)     Please see After Visit Summary for patient specific instructions.  No future appointments.   No orders of the defined types were placed in this encounter.     -------------------------------

## 2024-02-02 ENCOUNTER — Encounter: Payer: Self-pay | Admitting: Adult Health

## 2024-02-02 ENCOUNTER — Ambulatory Visit (INDEPENDENT_AMBULATORY_CARE_PROVIDER_SITE_OTHER): Admitting: Adult Health

## 2024-02-02 DIAGNOSIS — F411 Generalized anxiety disorder: Secondary | ICD-10-CM | POA: Diagnosis not present

## 2024-02-02 DIAGNOSIS — F22 Delusional disorders: Secondary | ICD-10-CM

## 2024-02-02 DIAGNOSIS — F900 Attention-deficit hyperactivity disorder, predominantly inattentive type: Secondary | ICD-10-CM

## 2024-02-02 DIAGNOSIS — G4700 Insomnia, unspecified: Secondary | ICD-10-CM

## 2024-02-02 DIAGNOSIS — F331 Major depressive disorder, recurrent, moderate: Secondary | ICD-10-CM

## 2024-02-02 MED ORDER — BUPROPION HCL ER (XL) 300 MG PO TB24: 300.0000 mg | ORAL_TABLET | Freq: Every day | ORAL | 5 refills | Status: DC

## 2024-02-02 MED ORDER — METHYLPHENIDATE HCL ER (OSM) 36 MG PO TBCR: EXTENDED_RELEASE_TABLET | ORAL | 0 refills | Status: DC

## 2024-02-02 MED ORDER — METHYLPHENIDATE HCL ER (OSM) 36 MG PO TBCR: 36.0000 mg | EXTENDED_RELEASE_TABLET | Freq: Every day | ORAL | 0 refills | Status: DC

## 2024-02-02 NOTE — Progress Notes (Signed)
 Adam Larsen [id number] [birth year] 38 y.o.  Subjective:   Patient ID:  Adam Larsen. is a 38 y.o. (DOB 06-07-1986) male.  Chief Complaint: No chief complaint on file.   HPI Adam Larsen. presents to the office today for follow-up of MDD, GAD, insomnia, paranoia, and ADHD.  Describes mood today as "ok". Pleasant. Denies tearfulness. Mood symptoms - denies depression, anxiety and irritability. Reports stable interest and motivation. Denies panic attacks. Denies worry, rumination, and over thinking. Reports mood as neutral. Stating "I feel like I'm not where I'm supposed to be at my age". Taking medications as prescribed.  Energy levels stable. Active, walking dog.  Enjoys some usual interests and activities. Single. Lives with parents. Spending time with family. Has started going back to church.  Appetite adequate. Reports weight gain - 225 to 230 pounds. Sleeps well most nights. Averages 6 to 7 broken hours a night. Report some daytime napping - 1.5 hours. Focus and concentration stable. Diagnosed with ADHD in childhood. Completing tasks around the house. Looking for employment. Denies SI or HI.   Denies AH or VH.  Denies paranoia. Denies self harm. Denies substance use.  Previous medication: Adam Larsen  PHQ2-9    Flowsheet Row Office Visit from 03/24/2023 in [facility] [place] [facility] from 05/16/2020 in [facility] Department at [facility]  PHQ-2 Total Score 0 6  PHQ-9 Total Score 0 17      Flowsheet Row ED from 01/24/2023 in [facility] ED from 12/25/2022 in [facility]) ED from 04/19/2020 in [facility] Department at [facility]  C-SSRS RISK CATEGORY No Risk No Risk Error: Q2 is Yes, you must answer 3, 4, and 5        Review of Systems:  Review of Systems  Musculoskeletal:  Negative for gait problem.   Neurological:  Negative for tremors.  Psychiatric/Behavioral:         Please refer to HPI    Medications: I have reviewed the patient's current medications.  Current Outpatient Medications  Medication Sig Dispense Refill   Ascorbic Acid (VITAMIN C) 1000 MG tablet Take 2,000 mg by mouth daily.     buPROPion (WELLBUTRIN XL) 300 MG 24 hr tablet Take 1 tablet (300 mg total) by mouth daily. 30 tablet 5   cholecalciferol (VITAMIN D3) 25 MCG (1000 UNIT) tablet Take 2,000 Units by mouth daily.     loratadine (CLARITIN) 10 MG tablet Take 1 tablet (10 mg total) by mouth daily. 30 tablet 0   [START ON 03/01/2024] methylphenidate (CONCERTA) 36 MG PO CR tablet Take one tablet in the morning and one tablet at lunch. 60 tablet 0   methylphenidate (CONCERTA) 36 MG PO CR tablet Take 1 tablet (36 mg total) by mouth daily. 30 tablet 0   Multiple Vitamin (MULTIVITAMIN) capsule Take 1 capsule by mouth daily.     omega-3 acid ethyl esters (LOVAZA) 1 g capsule Take 1 capsule (1 g total) by mouth 2 (two) times daily. 60 capsule 0   Omega-3 Fatty Acids (FISH OIL) 1000 MG CAPS Take 1,000 mg by mouth daily.     perphenazine (TRILAFON) 2 MG tablet Take 1 tablet (2 mg) by mouth with a 4 mg tablet twice a day (total of 6 mg bid) 60 tablet 5   perphenazine (TRILAFON) 4 MG tablet Take 1 tablet (4 mg) by mouth with a 2 mg tablet  twice a day. (6 mg bid) 60 tablet 5   No current facility-administered medications for this visit.    Medication Side Effects: None  Allergies: No Known Allergies  Past Medical History:  Diagnosis Date   Anxiety    Bipolar 1 disorder (HCC)    Depression    Mania (HCC)    Suicidal ideations     Past Medical History, Surgical history, Social history, and Family history were reviewed and updated as appropriate.   Please see review of systems for further details on the patient's review from today.   Objective:   Physical Exam:  There were no vitals taken for this visit.  Physical  Exam Constitutional:      General: He is not in acute distress. Musculoskeletal:        General: No deformity.  Neurological:     Mental Status: He is alert and oriented to person, place, and time.     Coordination: Coordination normal.  Psychiatric:        Attention and Perception: Attention and perception normal. He does not perceive auditory or visual hallucinations.        Mood and Affect: Mood normal. Affect is not labile, blunt, angry or inappropriate.        Speech: Speech normal.        Behavior: Behavior normal.        Thought Content: Thought content normal. Thought content is not paranoid or delusional. Thought content does not include homicidal or suicidal ideation. Thought content does not include homicidal or suicidal plan.        Cognition and Memory: Cognition and memory normal.        Judgment: Judgment normal.     Comments: Insight intact     Lab Review:     Component Value Date/Time   NA 139 03/24/2023 1043   NA 141 12/25/2022 1303   K 4.0 03/24/2023 1043   CL 101 03/24/2023 1043   CO2 27 03/24/2023 1043   GLUCOSE 95 03/24/2023 1043   BUN 14 03/24/2023 1043   BUN 11 12/25/2022 1303   CREATININE 1.10 03/24/2023 1043   CALCIUM 9.8 03/24/2023 1043   PROT 7.6 03/24/2023 1043   PROT 7.0 12/25/2022 1303   ALBUMIN 4.7 03/24/2023 1043   ALBUMIN 4.6 12/25/2022 1303   AST 20 03/24/2023 1043   ALT 30 03/24/2023 1043   ALKPHOS 66 03/24/2023 1043   BILITOT 0.4 03/24/2023 1043   BILITOT 0.3 12/25/2022 1303   GFRNONAA >60 05/16/2020 0912   GFRAA >60 05/16/2020 0912       Component Value Date/Time   WBC 11.7 (H) 03/24/2023 1043   RBC 5.10 03/24/2023 1043   HGB 15.5 03/24/2023 1043   HGB 15.4 12/25/2022 1303   HCT 46.3 03/24/2023 1043   HCT 44.4 12/25/2022 1303   PLT 339.0 03/24/2023 1043   PLT 336 12/25/2022 1303   MCV 90.8 03/24/2023 1043   MCV 92 12/25/2022 1303   MCH 31.9 12/25/2022 1303   MCH 29.9 05/16/2020 0912   MCHC 33.4 03/24/2023 1043   RDW  13.1 03/24/2023 1043   RDW 12.4 12/25/2022 1303   LYMPHSABS 4.5 (H) 03/24/2023 1043   LYMPHSABS 3.3 (H) 12/25/2022 1303   MONOABS 0.7 03/24/2023 1043   EOSABS 0.2 03/24/2023 1043   EOSABS 0.1 12/25/2022 1303   BASOSABS 0.1 03/24/2023 1043   BASOSABS 0.1 12/25/2022 1303    No results found for: "POCLITH", "LITHIUM"   No results found for: "PHENYTOIN", "PHENOBARB", "VALPROATE", "  CBMZ"   .res Assessment: Plan:   Plan:  Perphenazine 6mg  in am and 9mg  at bedtime -  (increased between visits bc he wasn't doing to well) Wellbutrin XL 300mg  daily Concerta 36mg  twice daily.   Monitor BP between visits while taking stimulant medication.  RTC 4 weeks  15 minutes spent dedicated to the care of this patient on the date of this encounter to include pre-visit review of records, ordering of medication, post visit documentation, and face-to-face time with the patient discussing MDD, GAD, insomnia, paranoia, and ADHD. Discussed continuing current medication regimen.  Therapist - Marquette Sites   Discussed potential benefits, risks, and side effects of stimulants with patient to include increased heart rate, palpitations, insomnia, increased anxiety, increased irritability, or decreased appetite. Also discussed history of paranoia and to watch for any change in moods. Will also discuss changes with parents. Instructed patient to contact office if experiencing any significant tolerability issues.   Patient advised to contact office with any questions, adverse effects, or acute worsening in signs and symptoms.  Discussed potential metabolic side effects associated with atypical antipsychotics, as well as potential risk for movement side effects. Advised pt to contact office if movement side effects occur.  Diagnoses and all orders for this visit:  Major depressive disorder, recurrent episode, moderate degree (HCC)  ADHD, predominantly inattentive type -     methylphenidate (CONCERTA) 36 MG PO CR  tablet; Take one tablet in the morning and one tablet at lunch. -     methylphenidate (CONCERTA) 36 MG PO CR tablet; Take 1 tablet (36 mg total) by mouth daily.  Insomnia, unspecified type -     buPROPion (WELLBUTRIN XL) 300 MG 24 hr tablet; Take 1 tablet (300 mg total) by mouth daily.  GAD (generalized anxiety disorder)  Paranoia (HCC)     Please see After Visit Summary for patient specific instructions.  No future appointments.  No orders of the defined types were placed in this encounter.   -------------------------------

## 2024-02-09 ENCOUNTER — Encounter: Payer: Self-pay | Admitting: Family Medicine

## 2024-02-09 ENCOUNTER — Ambulatory Visit (INDEPENDENT_AMBULATORY_CARE_PROVIDER_SITE_OTHER): Admitting: Family Medicine

## 2024-02-09 ENCOUNTER — Other Ambulatory Visit: Payer: Self-pay

## 2024-02-09 ENCOUNTER — Telehealth: Payer: Self-pay | Admitting: Adult Health

## 2024-02-09 ENCOUNTER — Ambulatory Visit: Payer: Self-pay | Admitting: Emergency Medicine

## 2024-02-09 VITALS — BP 120/72 | HR 82 | Temp 98.4°F | Ht 71.0 in | Wt 231.0 lb

## 2024-02-09 DIAGNOSIS — J302 Other seasonal allergic rhinitis: Secondary | ICD-10-CM

## 2024-02-09 DIAGNOSIS — F900 Attention-deficit hyperactivity disorder, predominantly inattentive type: Secondary | ICD-10-CM

## 2024-02-09 MED ORDER — FLUTICASONE PROPIONATE 50 MCG/ACT NA SUSP: 2.0000 | Freq: Every day | NASAL | 0 refills | Status: AC

## 2024-02-09 MED ORDER — METHYLPHENIDATE HCL ER (OSM) 36 MG PO TBCR: 36.0000 mg | EXTENDED_RELEASE_TABLET | Freq: Two times a day (BID) | ORAL | 0 refills | Status: DC

## 2024-02-09 NOTE — Telephone Encounter (Signed)
 Pended corrected Rx for 02/02/24 for BID, #60.

## 2024-02-09 NOTE — Telephone Encounter (Signed)
 Chief Complaint: Waking up without breathing  Symptoms: Postnasal drip Frequency: Intermittent Pertinent Negatives: Patient denies breathing difficulty during day, dizziness, chest pain  Disposition: [x] Appointment (In office)  Additional Notes: Pt states he is waking up in the middle of the night and feels like he is suffocating. Pt states he can't breathe for a few seconds and then it returns. Pt is not sure if it is sleep apnea or acid reflux. Pt states this was happening regularly about 6 months ago. This started again a few nights ago. This pt scheduled for an appointment today at PCP office with a different provider. Pt states understanding and agrees to plan.     Copied from CRM [id number]. Topic: Clinical - Red Word Triage >> Feb 09, 2024 12:17 PM Fredrica W wrote: Red Word that prompted transfer to Nurse Triage: waking up in the middle of night suffocating, trouble breathing Reason for Disposition  [1] Longstanding difficulty breathing (e.g., CHF, COPD, emphysema) AND [2] WORSE than normal  Answer Assessment - Initial Assessment Questions RESPIRATORY STATUS: "Describe your breathing?" (e.g., wheezing, shortness of breath, unable to speak, severe coughing)      No breathing for a few seconds during night; usually happens once in night ONSET: "When did this breathing problem begin?"      6 months ago then started again a few nights ago OTHER SYMPTOMS: "Do you have any other symptoms? (e.g., dizziness, runny nose, cough, chest pain, fever)     Denies dizziness, CP  Protocols used: Breathing Difficulty-A-AH

## 2024-02-09 NOTE — Progress Notes (Signed)
 Assessment & Plan:  1. Seasonal allergies (Primary) Education provided on allergies.  Encouraged to take Allegra daily instead of as needed as well as Flonase . - fluticasone  (FLONASE ) 50 MCG/ACT nasal spray; Place 2 sprays into both nostrils daily.  Dispense: 16 g; Refill: 0   Follow up plan: Return if symptoms worsen or fail to improve.  [name], MSN, APRN, FNP-C  Subjective:  HPI: Adam Larsen. is a 38 y.o. male presenting on 02/09/2024 for Shortness of Breath (Wakes up at night and feels like he is suffocating/Was happening every night 8 months ago then stopped and had another episode two nights ago/)  Patient is accompanied by his mom, who he is okay with being present. He reports waking up during the night feeling like he is suffocating.  He stands up immediately his symptoms resolved quickly.  He was experiencing this every night 8 months ago but then it stopped.  He had an episode two nights ago.  He took Pepcid at his mom's recommendation last night and did not have an episode last night.  He denies anxiety, coughing or burning in his throat.  He does have a history of night terrors but states this is very different.  He states he cannot tell if he has something in his throat or not.  He does have seasonal allergies and has been using saline spray twice daily and Allegra as needed.  Height: 5\' 11"  (1.803 m)  Weight: 231 lb (104.8 kg) BMI: Body mass index is 32.22 kg/m.  Do you snore loudly (louder than talking or loud enough to be heard through closed doors)?  No Do you often feel tired, fatigued, or sleepy during daytime?  Yes, but states he has a crazy sleep schedule and feels that is the cause. Has anyone observed you stop breathing during your sleep?  No Do you have or are you being treated for high blood pressure?  No BMI more than 35 kg/m2?  No Age over 50 years?  No Neck circumference greater than 40 cm?  No Male gender?  Yes  Total "Yes" Answers:  2    ROS: Negative unless specifically indicated above in HPI.   Relevant past medical history reviewed and updated as indicated.   Allergies and medications reviewed and updated.   Current Outpatient Medications:    Ascorbic Acid  (VITAMIN C) 1000 MG tablet, Take 2,000 mg by mouth daily., Disp: , Rfl:    buPROPion  (WELLBUTRIN  XL) 300 MG 24 hr tablet, Take 1 tablet (300 mg total) by mouth daily., Disp: 30 tablet, Rfl: 5   cholecalciferol  (VITAMIN D3) 25 MCG (1000 UNIT) tablet, Take 2,000 Units by mouth daily., Disp: , Rfl:    [START ON 03/01/2024] methylphenidate  (CONCERTA ) 36 MG PO CR tablet, Take one tablet in the morning and one tablet at lunch., Disp: 60 tablet, Rfl: 0   Multiple Vitamin (MULTIVITAMIN) capsule, Take 1 capsule by mouth daily., Disp: , Rfl:    Omega-3 Fatty Acids (FISH OIL ) 1000 MG CAPS, Take 1,000 mg by mouth daily., Disp: , Rfl:    perphenazine  (TRILAFON ) 2 MG tablet, Take 1 tablet (2 mg) by mouth with a 4 mg tablet twice a day (total of 6 mg bid), Disp: 60 tablet, Rfl: 5   perphenazine  (TRILAFON ) 4 MG tablet, Take 1 tablet (4 mg) by mouth with a 2 mg tablet twice a day. (6 mg bid), Disp: 60 tablet, Rfl: 5   methylphenidate  (CONCERTA ) 36 MG PO CR tablet, Take 1 tablet (  36 mg total) by mouth daily. (Patient not taking: Reported on 02/09/2024), Disp: 30 tablet, Rfl: 0  No Known Allergies  Objective:   BP 120/72   Pulse 82   Temp 98.4 F (36.9 C)   Ht 5\' 11"  (1.803 m)   Wt 231 lb (104.8 kg)   BMI 32.22 kg/m    Physical Exam Vitals reviewed.  Constitutional:      General: He is not in acute distress.    Appearance: Normal appearance. He is not ill-appearing, toxic-appearing or diaphoretic.  HENT:     Head: Normocephalic and atraumatic.     Right Ear: Tympanic membrane, ear canal and external ear normal.     Left Ear: Tympanic membrane, ear canal and external ear normal.     Nose:     Right Turbinates: Enlarged.     Left Turbinates: Enlarged.      Mouth/Throat:     Mouth: Mucous membranes are moist.     Pharynx: Oropharynx is clear. No oropharyngeal exudate or posterior oropharyngeal erythema.  Eyes:     General: No scleral icterus.       Right eye: No discharge.        Left eye: No discharge.     Conjunctiva/sclera: Conjunctivae normal.  Cardiovascular:     Rate and Rhythm: Normal rate and regular rhythm.     Heart sounds: Normal heart sounds. No murmur heard.    No friction rub. No gallop.  Pulmonary:     Effort: Pulmonary effort is normal. No respiratory distress.     Breath sounds: Normal breath sounds. No stridor. No wheezing, rhonchi or rales.  Musculoskeletal:        General: Normal range of motion.     Cervical back: Normal range of motion.  Skin:    General: Skin is warm and dry.  Neurological:     Mental Status: He is alert and oriented to person, place, and time. Mental status is at baseline.  Psychiatric:        Mood and Affect: Mood normal.        Behavior: Behavior normal.        Thought Content: Thought content normal.        Judgment: Judgment normal.

## 2024-02-09 NOTE — Telephone Encounter (Signed)
 Pt called 12:43 reporting Rx for Concerta  was only #30 S/B #60. Will need the other #30. Contact pt @ [phone]. Apt  6/16

## 2024-04-05 ENCOUNTER — Ambulatory Visit (INDEPENDENT_AMBULATORY_CARE_PROVIDER_SITE_OTHER): Admitting: Adult Health

## 2024-04-05 ENCOUNTER — Encounter: Payer: Self-pay | Admitting: Adult Health

## 2024-04-05 DIAGNOSIS — F411 Generalized anxiety disorder: Secondary | ICD-10-CM | POA: Diagnosis not present

## 2024-04-05 DIAGNOSIS — F22 Delusional disorders: Secondary | ICD-10-CM

## 2024-04-05 DIAGNOSIS — F331 Major depressive disorder, recurrent, moderate: Secondary | ICD-10-CM

## 2024-04-05 DIAGNOSIS — G4700 Insomnia, unspecified: Secondary | ICD-10-CM | POA: Diagnosis not present

## 2024-04-05 DIAGNOSIS — F900 Attention-deficit hyperactivity disorder, predominantly inattentive type: Secondary | ICD-10-CM | POA: Diagnosis not present

## 2024-04-05 MED ORDER — METHYLPHENIDATE HCL ER (OSM) 36 MG PO TBCR: 36.0000 mg | EXTENDED_RELEASE_TABLET | Freq: Two times a day (BID) | ORAL | 0 refills | Status: DC

## 2024-04-05 MED ORDER — METHYLPHENIDATE HCL ER (OSM) 36 MG PO TBCR: EXTENDED_RELEASE_TABLET | ORAL | 0 refills | Status: DC

## 2024-04-05 MED ORDER — PERPHENAZINE 4 MG PO TABS: ORAL_TABLET | ORAL | 5 refills | Status: DC

## 2024-04-05 NOTE — Addendum Note (Signed)
 Addended by: Reagan Camera on: 04/05/2024 10:28 AM   Modules accepted: Level of Service

## 2024-04-05 NOTE — Progress Notes (Signed)
 Adam Larsen [id number] 08-08-1986 38 y.o.  Subjective:   Patient ID:  Adam Larsen. is a 38 y.o. (DOB 1985/12/14) male.  Chief Complaint: No chief complaint on file.   HPI Adam Larsen. presents to the office today for follow-up of MDD, GAD, insomnia, paranoia, and ADHD.  Describes mood today as ok. Pleasant. Denies tearfulness. Mood symptoms - denies depression and anxiety. Reports irritability at times. Reports fluctuating interest and motivation. Denies panic attacks. Reports worry, rumination and over thinking. Reports mood as neutral. Stating I feel like I'm doing pretty good. Taking medications as prescribed.  Energy levels stable. Active, walking dog.  Enjoys some usual interests and activities. Single. Lives with parents. Spending time with family. Has started going back to church.  Appetite adequate. Reports weight gain - 230 to 232 pounds. Sleeps well most nights. Averages 6 to 7 broken hours a night. Report some daytime napping - 1.5 hours. Focus and concentration stable. Diagnosed with ADHD in childhood. Completing tasks around the house. Looking for employment. Denies SI or HI.   Denies AH or VH.  Denies paranoia. Denies self harm. Denies substance use.  Previous medication: Jayson Michael, Lamictal   GAD-7    Flowsheet Row Office Visit from 02/09/2024 in [facility] at [facility]  Total GAD-7 Score 2   PHQ2-9    Flowsheet Row Office Visit from 02/09/2024 in [facility] at [facility] Office Visit from 03/24/2023 in [facility] at [place] ED from 05/16/2020 in [facility] Emergency Department at [facility]  PHQ-2 Total Score 0 0 6  PHQ-9 Total Score 4 0 17   Flowsheet Row UC from 01/24/2023 in [facility]) UC from 12/25/2022 in [facility] at [facility]) ED from 04/19/2020 in [facility] Emergency Department at [facility]  C-SSRS RISK CATEGORY No Risk No Risk Error: Q2 is Yes, you must answer 3, 4, and 5     Review of Systems:  Review of Systems  Medications: I have reviewed the patient's current medications.  Current Outpatient Medications  Medication Sig Dispense Refill   Ascorbic Acid  (VITAMIN C) 1000 MG tablet Take 2,000 mg by mouth daily.     buPROPion  (WELLBUTRIN  XL) 300 MG 24 hr tablet Take 1 tablet (300 mg total) by mouth daily. 30 tablet 5   cholecalciferol  (VITAMIN D3) 25 MCG (1000 UNIT) tablet Take 2,000 Units by mouth daily.     fluticasone  (FLONASE ) 50 MCG/ACT nasal spray Place 2 sprays into both nostrils daily. 16 g 0   methylphenidate  (CONCERTA ) 36 MG PO CR tablet Take one tablet in the morning and one tablet at lunch. 60 tablet 0   methylphenidate  (CONCERTA ) 36 MG PO CR tablet Take 1 tablet (36 mg total) by mouth 2 (two) times daily with breakfast and lunch. 60 tablet 0   Multiple Vitamin (MULTIVITAMIN) capsule Take 1 capsule by mouth daily.     Omega-3 Fatty Acids (FISH OIL ) 1000 MG CAPS Take 1,000 mg by mouth daily.     perphenazine  (TRILAFON ) 2 MG tablet Take 1 tablet (2 mg) by mouth with a 4 mg tablet twice a day (total of 6 mg bid) 60 tablet 5   perphenazine  (TRILAFON ) 4 MG tablet Take 1 tablet (4 mg) by mouth with a 2 mg tablet twice a day. (6 mg bid) 60 tablet 5   No current facility-administered medications for this visit.  Medication Side Effects: None  Allergies: No Known Allergies  Past Medical History:  Diagnosis Date   Anxiety    Bipolar 1 disorder (HCC)    Depression    Mania (HCC)    Suicidal ideations     Past Medical History, Surgical history, Social history, and Family history were reviewed and updated as appropriate.   Please see review of systems for further details on the patient's review from today.   Objective:   Physical Exam:  There were no vitals taken for this visit.  Physical Exam Constitutional:      General: He is not in  acute distress.  Musculoskeletal:        General: No deformity.   Neurological:     Mental Status: He is alert and oriented to person, place, and time.     Coordination: Coordination normal.   Psychiatric:        Attention and Perception: Attention and perception normal. He does not perceive auditory or visual hallucinations.        Mood and Affect: Mood normal. Mood is not anxious or depressed. Affect is not labile, blunt, angry or inappropriate.        Speech: Speech normal.        Behavior: Behavior normal.        Thought Content: Thought content normal. Thought content is not paranoid or delusional. Thought content does not include homicidal or suicidal ideation. Thought content does not include homicidal or suicidal plan.        Cognition and Memory: Cognition and memory normal.        Judgment: Judgment normal.     Comments: Insight intact     Lab Review:     Component Value Date/Time   NA 139 03/24/2023 1043   NA 141 12/25/2022 1303   K 4.0 03/24/2023 1043   CL 101 03/24/2023 1043   CO2 27 03/24/2023 1043   GLUCOSE 95 03/24/2023 1043   BUN 14 03/24/2023 1043   BUN 11 12/25/2022 1303   CREATININE 1.10 03/24/2023 1043   CALCIUM 9.8 03/24/2023 1043   PROT 7.6 03/24/2023 1043   PROT 7.0 12/25/2022 1303   ALBUMIN 4.7 03/24/2023 1043   ALBUMIN 4.6 12/25/2022 1303   AST 20 03/24/2023 1043   ALT 30 03/24/2023 1043   ALKPHOS 66 03/24/2023 1043   BILITOT 0.4 03/24/2023 1043   BILITOT 0.3 12/25/2022 1303   GFRNONAA >60 05/16/2020 0912   GFRAA >60 05/16/2020 0912       Component Value Date/Time   WBC 11.7 (H) 03/24/2023 1043   RBC 5.10 03/24/2023 1043   HGB 15.5 03/24/2023 1043   HGB 15.4 12/25/2022 1303   HCT 46.3 03/24/2023 1043   HCT 44.4 12/25/2022 1303   PLT 339.0 03/24/2023 1043   PLT 336 12/25/2022 1303   MCV 90.8 03/24/2023 1043   MCV 92 12/25/2022 1303   MCH 31.9 12/25/2022 1303   MCH 29.9 05/16/2020 0912   MCHC 33.4 03/24/2023 1043   RDW 13.1  03/24/2023 1043   RDW 12.4 12/25/2022 1303   LYMPHSABS 4.5 (H) 03/24/2023 1043   LYMPHSABS 3.3 (H) 12/25/2022 1303   MONOABS 0.7 03/24/2023 1043   EOSABS 0.2 03/24/2023 1043   EOSABS 0.1 12/25/2022 1303   BASOSABS 0.1 03/24/2023 1043   BASOSABS 0.1 12/25/2022 1303    No results found for: POCLITH, LITHIUM   No results found for: PHENYTOIN, PHENOBARB, VALPROATE, CBMZ   .res Assessment: Plan:    Plan:  Perphenazine   6mg  in am and 6mg  at bedtime. Wellbutrin  XL 300mg  daily Concerta  36mg  twice daily.   Monitor BP between visits while taking stimulant medication.  RTC 4 weeks  25 minutes spent dedicated to the care of this patient on the date of this encounter to include pre-visit review of records, ordering of medication, post visit documentation, and face-to-face time with the patient discussing MDD, GAD, insomnia, paranoia, and ADHD. Discussed continuing current medication regimen.  Therapist - Marquette Sites   Discussed potential benefits, risks, and side effects of stimulants with patient to include increased heart rate, palpitations, insomnia, increased anxiety, increased irritability, or decreased appetite. Also discussed history of paranoia and to watch for any change in moods. Will also discuss changes with parents. Instructed patient to contact office if experiencing any significant tolerability issues.   Patient advised to contact office with any questions, adverse effects, or acute worsening in signs and symptoms.  Discussed potential metabolic side effects associated with atypical antipsychotics, as well as potential risk for movement side effects. Advised pt to contact office if movement side effects occur.  There are no diagnoses linked to this encounter.   Please see After Visit Summary for patient specific instructions.  Future Appointments  Date Time Provider Department Center  05/04/2024  8:30 AM Niam Nepomuceno Nattalie, NP CP-CP None    No orders  of the defined types were placed in this encounter.   -------------------------------

## 2024-05-04 ENCOUNTER — Ambulatory Visit (INDEPENDENT_AMBULATORY_CARE_PROVIDER_SITE_OTHER): Admitting: Adult Health

## 2024-05-04 ENCOUNTER — Encounter: Payer: Self-pay | Admitting: Adult Health

## 2024-05-04 DIAGNOSIS — F900 Attention-deficit hyperactivity disorder, predominantly inattentive type: Secondary | ICD-10-CM

## 2024-05-04 DIAGNOSIS — F331 Major depressive disorder, recurrent, moderate: Secondary | ICD-10-CM | POA: Diagnosis not present

## 2024-05-04 DIAGNOSIS — F22 Delusional disorders: Secondary | ICD-10-CM

## 2024-05-04 DIAGNOSIS — F411 Generalized anxiety disorder: Secondary | ICD-10-CM | POA: Diagnosis not present

## 2024-05-04 DIAGNOSIS — G4700 Insomnia, unspecified: Secondary | ICD-10-CM | POA: Diagnosis not present

## 2024-05-04 MED ORDER — PERPHENAZINE 8 MG PO TABS: ORAL_TABLET | ORAL | 2 refills | Status: DC

## 2024-05-04 MED ORDER — METHYLPHENIDATE HCL ER (OSM) 36 MG PO TBCR: EXTENDED_RELEASE_TABLET | ORAL | 0 refills | Status: DC

## 2024-05-04 NOTE — Progress Notes (Signed)
 Adam Larsen [id number] Feb 07, 1986 38 y.o.  Subjective:   Patient ID:  Adam Larsen. is a 38 y.o. (DOB Jun 22, 1986) male.  Chief Complaint: No chief complaint on file.   HPI Adam Larsen. presents to the office today for follow-up of MDD, GAD, insomnia, paranoia and ADHD.  Describes mood today as ok. Pleasant. Denies tearfulness. Mood symptoms - denies depression and irritability. Reports increased anxiety. Reports recent increase in paranoia. Reports lower interest and motivation. Denies panic attacks. Reports worry, rumination and over thinking. Reports mood as lower than average. Stating overall, I feel like I'm on the right track. Taking medications as prescribed.  Energy levels stable. Active, does not have a regular exercise routine. Enjoys some usual interests and activities. Single. Lives with parents. Spending time with family. Has stopped going church.  Appetite adequate. Reports weight stable - 230 pounds. Sleeps well most nights. Averages 6 to 7 broken hours a night waking up once or twice during the night. Report some daytime napping - 1.5 hours. Focus and concentration stable. Diagnosed with ADHD in childhood. Completing tasks around the house. Looking for employment. Denies SI or HI.   Denies AH or VH.  Denies paranoia. Denies self harm. Denies substance use.  Previous medication: Chalmers, Lamictal    GAD-7    Flowsheet Row Office Visit from 02/09/2024 in [facility] at [facility]  Total GAD-7 Score 2   PHQ2-9    Flowsheet Row Office Visit from 02/09/2024 in [facility] at [facility] Office Visit from 03/24/2023 in [facility] [place] ED from 05/16/2020 in [facility] Emergency Department at [facility]  PHQ-2 Total Score 0 0 6  PHQ-9 Total Score 4 0 17   Flowsheet Row UC from 01/24/2023 in [facility] at [facility]) UC from 12/25/2022 in [facility]) ED from 04/19/2020 in [facility] Department at [facility]  C-SSRS RISK CATEGORY No Risk No Risk Error: Q2 is Yes, you must answer 3, 4, and 5     Review of Systems:  Review of Systems  Musculoskeletal:  Negative for gait problem.  Neurological:  Negative for tremors.  Psychiatric/Behavioral:         Please refer to HPI    Medications: I have reviewed the patient's current medications.  Current Outpatient Medications  Medication Sig Dispense Refill   Ascorbic Acid  (VITAMIN C) 1000 MG tablet Take 2,000 mg by mouth daily.     buPROPion  (WELLBUTRIN  XL) 300 MG 24 hr tablet Take 1 tablet (300 mg total) by mouth daily. 30 tablet 5   cholecalciferol  (VITAMIN D3) 25 MCG (1000 UNIT) tablet Take 2,000 Units by mouth daily.     fluticasone  (FLONASE ) 50 MCG/ACT nasal spray Place 2 sprays into both nostrils daily. 16 g 0   methylphenidate  (CONCERTA ) 36 MG PO CR tablet Take one tablet in the morning and one tablet at lunch. 60 tablet 0   methylphenidate  (CONCERTA ) 36 MG PO CR tablet Take 1 tablet (36 mg total) by mouth 2 (two) times daily with breakfast and lunch. 60 tablet 0   Multiple Vitamin (MULTIVITAMIN) capsule Take 1 capsule by mouth daily.     Omega-3 Fatty Acids (FISH OIL ) 1000 MG CAPS Take 1,000 mg by mouth daily.     perphenazine  (TRILAFON ) 2 MG tablet Take 1 tablet (2 mg) by mouth with a 4 mg tablet twice a day (total of  6 mg bid) 60 tablet 5   perphenazine  (TRILAFON ) 4 MG tablet Take 1 tablet (4 mg) by mouth with a 2 mg tablet twice a day. (6 mg bid) 60 tablet 5   No current facility-administered medications for this visit.    Medication Side Effects: None  Allergies: No Known Allergies  Past Medical History:  Diagnosis Date   Anxiety    Bipolar 1 disorder (HCC)    Depression    Mania (HCC)    Suicidal ideations     Past Medical History, Surgical history, Social history, and Family history were reviewed  and updated as appropriate.   Please see review of systems for further details on the patient's review from today.   Objective:   Physical Exam:  There were no vitals taken for this visit.  Physical Exam Constitutional:      General: He is not in acute distress. Musculoskeletal:        General: No deformity.  Neurological:     Mental Status: He is alert and oriented to person, place, and time.     Coordination: Coordination normal.  Psychiatric:        Attention and Perception: Attention and perception normal. He does not perceive auditory or visual hallucinations.        Mood and Affect: Mood is anxious. Mood is not depressed. Affect is not labile, blunt, angry or inappropriate.        Speech: Speech normal.        Behavior: Behavior normal.        Thought Content: Thought content normal. Thought content is not paranoid or delusional. Thought content does not include homicidal or suicidal ideation. Thought content does not include homicidal or suicidal plan.        Cognition and Memory: Cognition and memory normal.        Judgment: Judgment normal.     Comments: Insight intact     Lab Review:     Component Value Date/Time   NA 139 03/24/2023 1043   NA 141 12/25/2022 1303   K 4.0 03/24/2023 1043   CL 101 03/24/2023 1043   CO2 27 03/24/2023 1043   GLUCOSE 95 03/24/2023 1043   BUN 14 03/24/2023 1043   BUN 11 12/25/2022 1303   CREATININE 1.10 03/24/2023 1043   CALCIUM 9.8 03/24/2023 1043   PROT 7.6 03/24/2023 1043   PROT 7.0 12/25/2022 1303   ALBUMIN 4.7 03/24/2023 1043   ALBUMIN 4.6 12/25/2022 1303   AST 20 03/24/2023 1043   ALT 30 03/24/2023 1043   ALKPHOS 66 03/24/2023 1043   BILITOT 0.4 03/24/2023 1043   BILITOT 0.3 12/25/2022 1303   GFRNONAA >60 05/16/2020 0912   GFRAA >60 05/16/2020 0912       Component Value Date/Time   WBC 11.7 (H) 03/24/2023 1043   RBC 5.10 03/24/2023 1043   HGB 15.5 03/24/2023 1043   HGB 15.4 12/25/2022 1303   HCT 46.3 03/24/2023  1043   HCT 44.4 12/25/2022 1303   PLT 339.0 03/24/2023 1043   PLT 336 12/25/2022 1303   MCV 90.8 03/24/2023 1043   MCV 92 12/25/2022 1303   MCH 31.9 12/25/2022 1303   MCH 29.9 05/16/2020 0912   MCHC 33.4 03/24/2023 1043   RDW 13.1 03/24/2023 1043   RDW 12.4 12/25/2022 1303   LYMPHSABS 4.5 (H) 03/24/2023 1043   LYMPHSABS 3.3 (H) 12/25/2022 1303   MONOABS 0.7 03/24/2023 1043   EOSABS 0.2 03/24/2023 1043   EOSABS 0.1 12/25/2022 1303  BASOSABS 0.1 03/24/2023 1043   BASOSABS 0.1 12/25/2022 1303    No results found for: POCLITH, LITHIUM   No results found for: PHENYTOIN, PHENOBARB, VALPROATE, CBMZ   .res Assessment: Plan:    Plan:  Perphenazine  6mg  in am and 6mg  at bedtime - currently taking 8mg  BID - will send in new script for dose adjustment. Wellbutrin  XL 300mg  daily Concerta  36mg  twice daily.   Monitor BP between visits while taking stimulant medication.  RTC 4 weeks  25 minutes spent dedicated to the care of this patient on the date of this encounter to include pre-visit review of records, ordering of medication, post visit documentation, and face-to-face time with the patient discussing MDD, GAD, insomnia, paranoia, and ADHD. Discussed continuing current medication regimen.  Therapist - Medford Fischer   Discussed potential benefits, risks, and side effects of stimulants with patient to include increased heart rate, palpitations, insomnia, increased anxiety, increased irritability, or decreased appetite. Also discussed history of paranoia and to watch for any change in moods. Will also discuss changes with parents. Instructed patient to contact office if experiencing any significant tolerability issues.   Patient advised to contact office with any questions, adverse effects, or acute worsening in signs and symptoms.  Discussed potential metabolic side effects associated with atypical antipsychotics, as well as potential risk for movement side effects. Advised  pt to contact office if movement side effects occur.   There are no diagnoses linked to this encounter.   Please see After Visit Summary for patient specific instructions.  Future Appointments  Date Time Provider Department Center  06/08/2024  8:00 AM Lovetta Condie Nattalie, NP CP-CP None  07/05/2024  8:00 AM Courtlynn Holloman Nattalie, NP CP-[facility] None  08/02/2024  8:00 AM Joda Braatz Nattalie, NP CP-[facility] None  09/06/2024  8:30 AM Rainbow Salman Nattalie, NP CP-CP None  10/04/2024  8:00 AM Kaleiah Kutzer Nattalie, NP CP-CP None    No orders of the defined types were placed in this encounter.   -------------------------------

## 2024-06-08 ENCOUNTER — Encounter: Payer: Self-pay | Admitting: Adult Health

## 2024-06-08 ENCOUNTER — Ambulatory Visit (INDEPENDENT_AMBULATORY_CARE_PROVIDER_SITE_OTHER): Admitting: Adult Health

## 2024-06-08 DIAGNOSIS — F900 Attention-deficit hyperactivity disorder, predominantly inattentive type: Secondary | ICD-10-CM | POA: Diagnosis not present

## 2024-06-08 DIAGNOSIS — F331 Major depressive disorder, recurrent, moderate: Secondary | ICD-10-CM

## 2024-06-08 DIAGNOSIS — G4700 Insomnia, unspecified: Secondary | ICD-10-CM

## 2024-06-08 DIAGNOSIS — F411 Generalized anxiety disorder: Secondary | ICD-10-CM

## 2024-06-08 DIAGNOSIS — F22 Delusional disorders: Secondary | ICD-10-CM

## 2024-06-08 MED ORDER — METHYLPHENIDATE HCL ER (OSM) 36 MG PO TBCR: EXTENDED_RELEASE_TABLET | ORAL | 0 refills | Status: DC

## 2024-06-08 MED ORDER — METHYLPHENIDATE HCL ER (OSM) 36 MG PO TBCR: 36.0000 mg | EXTENDED_RELEASE_TABLET | Freq: Two times a day (BID) | ORAL | 0 refills | Status: DC

## 2024-06-08 NOTE — Progress Notes (Signed)
 Adam Larsen [id number] [birth year] 38 y.o.  Subjective:   Patient ID:  Adam Larsen. is a 38 y.o. (DOB 12/21/1985) male.  Chief Complaint: No chief complaint on file.   HPI Adam Larsen. presents to the office today for follow-up of MDD, GAD, insomnia, paranoia and ADHD.  Describes mood today as ok. Pleasant. Denies tearfulness. Mood symptoms - denies anxiety, depression and irritability. Reports stable interest and motivation. Denies panic attacks. Denies worry, rumination and over thinking. Reports mood has improved. Stating I feel like I'm doing better. Taking medications as prescribed.  Energy levels stable. Active, walking daily. Enjoys some usual interests and activities. Single. Lives with parents. Spending time with family. Has stopped going church.  Appetite adequate. Reports weight stable - 230 pounds. Sleeps better some nights than others. Averages 6 to 7 hours of broken sleep.Report some daytime napping - 1.5 hours. Focus and concentration stable. Diagnosed with ADHD in childhood. Completing tasks around the house. Looking for employment - put in an application at [employer]. Denies SI or HI.   Denies AH or VH.  Denies paranoia. Denies self harm. Denies substance use.  Previous medication: Chalmers, Lamictal    GAD-7    Flowsheet Row Office Visit from 02/09/2024 in [facility] at [facility]  Total GAD-7 Score 2   PHQ2-9    Flowsheet Row Office Visit from 02/09/2024 in [facility] at [facility] Office Visit from 03/24/2023 in [facility] at [place] ED from 05/16/2020 in [facility] Emergency Department at [facility]  PHQ-2 Total Score 0 0 6  PHQ-9 Total Score 4 0 17   Flowsheet Row UC from 01/24/2023 in [facility] at [facility]) UC from 12/25/2022 in [facility] at [facility]) ED from 04/19/2020 in [facility] Emergency  Department at [facility]  C-SSRS RISK CATEGORY No Risk No Risk Error: Q2 is Yes, you must answer 3, 4, and 5     Review of Systems:  Review of Systems  Musculoskeletal:  Negative for gait problem.  Neurological:  Negative for tremors.  Psychiatric/Behavioral:         Please refer to HPI    Medications: I have reviewed the patient's current medications.  Current Outpatient Medications  Medication Sig Dispense Refill   Ascorbic Acid  (VITAMIN C) 1000 MG tablet Take 2,000 mg by mouth daily.     buPROPion  (WELLBUTRIN  XL) 300 MG 24 hr tablet Take 1 tablet (300 mg total) by mouth daily. 30 tablet 5   cholecalciferol  (VITAMIN D3) 25 MCG (1000 UNIT) tablet Take 2,000 Units by mouth daily.     fluticasone  (FLONASE ) 50 MCG/ACT nasal spray Place 2 sprays into both nostrils daily. 16 g 0   methylphenidate  (CONCERTA ) 36 MG PO CR tablet Take 1 tablet (36 mg total) by mouth 2 (two) times daily with breakfast and lunch. 60 tablet 0   methylphenidate  (CONCERTA ) 36 MG PO CR tablet Take one tablet in the morning and one tablet at lunch. 60 tablet 0   Multiple Vitamin (MULTIVITAMIN) capsule Take 1 capsule by mouth daily.     Omega-3 Fatty Acids (FISH OIL ) 1000 MG CAPS Take 1,000 mg by mouth daily.     perphenazine  (TRILAFON ) 2 MG tablet Take 1 tablet (2 mg) by mouth with a 4 mg tablet twice a day (total of 6 mg bid) 60 tablet 5   perphenazine  (TRILAFON ) 8 MG tablet Take one tablet twice  daily. 60 tablet 2   No current facility-administered medications for this visit.    Medication Side Effects: None  Allergies: No Known Allergies  Past Medical History:  Diagnosis Date   Anxiety    Bipolar 1 disorder (HCC)    Depression    Mania (HCC)    Suicidal ideations     Past Medical History, Surgical history, Social history, and Family history were reviewed and updated as appropriate.   Please see review of systems for further details on the patient's review from today.   Objective:    Physical Exam:  There were no vitals taken for this visit.  Physical Exam Constitutional:      General: He is not in acute distress. Musculoskeletal:        General: No deformity.  Neurological:     Mental Status: He is alert and oriented to person, place, and time.     Coordination: Coordination normal.  Psychiatric:        Attention and Perception: Attention and perception normal. He does not perceive auditory or visual hallucinations.        Mood and Affect: Mood normal. Mood is not anxious or depressed. Affect is not labile, blunt, angry or inappropriate.        Speech: Speech normal.        Behavior: Behavior normal.        Thought Content: Thought content normal. Thought content is not paranoid or delusional. Thought content does not include homicidal or suicidal ideation. Thought content does not include homicidal or suicidal plan.        Cognition and Memory: Cognition and memory normal.        Judgment: Judgment normal.     Comments: Insight intact     Lab Review:     Component Value Date/Time   NA 139 03/24/2023 1043   NA 141 12/25/2022 1303   K 4.0 03/24/2023 1043   CL 101 03/24/2023 1043   CO2 27 03/24/2023 1043   GLUCOSE 95 03/24/2023 1043   BUN 14 03/24/2023 1043   BUN 11 12/25/2022 1303   CREATININE 1.10 03/24/2023 1043   CALCIUM 9.8 03/24/2023 1043   PROT 7.6 03/24/2023 1043   PROT 7.0 12/25/2022 1303   ALBUMIN 4.7 03/24/2023 1043   ALBUMIN 4.6 12/25/2022 1303   AST 20 03/24/2023 1043   ALT 30 03/24/2023 1043   ALKPHOS 66 03/24/2023 1043   BILITOT 0.4 03/24/2023 1043   BILITOT 0.3 12/25/2022 1303   GFRNONAA >60 05/16/2020 0912   GFRAA >60 05/16/2020 0912       Component Value Date/Time   WBC 11.7 (H) 03/24/2023 1043   RBC 5.10 03/24/2023 1043   HGB 15.5 03/24/2023 1043   HGB 15.4 12/25/2022 1303   HCT 46.3 03/24/2023 1043   HCT 44.4 12/25/2022 1303   PLT 339.0 03/24/2023 1043   PLT 336 12/25/2022 1303   MCV 90.8 03/24/2023 1043   MCV 92  12/25/2022 1303   MCH 31.9 12/25/2022 1303   MCH 29.9 05/16/2020 0912   MCHC 33.4 03/24/2023 1043   RDW 13.1 03/24/2023 1043   RDW 12.4 12/25/2022 1303   LYMPHSABS 4.5 (H) 03/24/2023 1043   LYMPHSABS 3.3 (H) 12/25/2022 1303   MONOABS 0.7 03/24/2023 1043   EOSABS 0.2 03/24/2023 1043   EOSABS 0.1 12/25/2022 1303   BASOSABS 0.1 03/24/2023 1043   BASOSABS 0.1 12/25/2022 1303    No results found for: POCLITH, LITHIUM   No results found for: PHENYTOIN, PHENOBARB,  VALPROATE, CBMZ   .res Assessment: Plan:    Plan:  Perphenazine  8mg  in am and 8mg  at bedtime  Wellbutrin  XL 300mg  daily Concerta  36mg  twice daily.   Monitor BP between visits while taking stimulant medication.  RTC 4 weeks  25 minutes spent dedicated to the care of this patient on the date of this encounter to include pre-visit review of records, ordering of medication, post visit documentation, and face-to-face time with the patient discussing MDD, GAD, insomnia, paranoia, and ADHD. Discussed continuing current medication regimen.  Therapist - Medford Fischer   Discussed potential benefits, risks, and side effects of stimulants with patient to include increased heart rate, palpitations, insomnia, increased anxiety, increased irritability, or decreased appetite. Also discussed history of paranoia and to watch for any change in moods. Will also discuss changes with parents. Instructed patient to contact office if experiencing any significant tolerability issues.   Patient advised to contact office with any questions, adverse effects, or acute worsening in signs and symptoms.  Discussed potential metabolic side effects associated with atypical antipsychotics, as well as potential risk for movement side effects. Advised pt to contact office if movement side effects occur.    There are no diagnoses linked to this encounter.   Please see After Visit Summary for patient specific instructions.  Future Appointments   Date Time Provider Department Center  07/05/2024  8:00 AM Penelopi Mikrut Nattalie, NP CP-CP None  08/02/2024  8:00 AM Raushanah Osmundson Nattalie, NP CP-[facility] None  09/06/2024  8:30 AM Sheena Simonis Nattalie, NP CP-[facility] None  10/04/2024  8:00 AM [place] Nattalie, NP CP-CP None    No orders of the defined types were placed in this encounter.   -------------------------------

## 2024-07-05 ENCOUNTER — Encounter: Payer: Self-pay | Admitting: Adult Health

## 2024-07-05 ENCOUNTER — Ambulatory Visit (INDEPENDENT_AMBULATORY_CARE_PROVIDER_SITE_OTHER): Admitting: Adult Health

## 2024-07-05 DIAGNOSIS — F411 Generalized anxiety disorder: Secondary | ICD-10-CM

## 2024-07-05 DIAGNOSIS — G4700 Insomnia, unspecified: Secondary | ICD-10-CM

## 2024-07-05 DIAGNOSIS — F331 Major depressive disorder, recurrent, moderate: Secondary | ICD-10-CM

## 2024-07-05 DIAGNOSIS — F900 Attention-deficit hyperactivity disorder, predominantly inattentive type: Secondary | ICD-10-CM

## 2024-07-05 MED ORDER — PERPHENAZINE 8 MG PO TABS: ORAL_TABLET | ORAL | 5 refills | Status: DC

## 2024-07-05 MED ORDER — METHYLPHENIDATE HCL ER (OSM) 36 MG PO TBCR: EXTENDED_RELEASE_TABLET | ORAL | 0 refills | Status: DC

## 2024-07-05 MED ORDER — METHYLPHENIDATE HCL ER (OSM) 36 MG PO TBCR: 36.0000 mg | EXTENDED_RELEASE_TABLET | Freq: Two times a day (BID) | ORAL | 0 refills | Status: DC

## 2024-07-05 MED ORDER — BUPROPION HCL ER (XL) 300 MG PO TB24: 300.0000 mg | ORAL_TABLET | Freq: Every day | ORAL | 5 refills | Status: DC

## 2024-07-05 NOTE — Progress Notes (Signed)
 Adam Larsen [id number] 1985/11/01 38 y.o.  Subjective:   Patient ID:  Adam Antigua. is a 38 y.o. (DOB March 26, 1986) male.  Chief Complaint: No chief complaint on file.   HPI Adam Gearin. presents to the office today for follow-up of MDD, GAD, insomnia, paranoia and ADHD.  Describes mood today as ok. Pleasant. Denies tearfulness. Mood symptoms - denies anxiety, depression and irritability. Reports stable interest and motivation. Denies panic attacks. Reports some worry, rumination and over thinking - a little bit. Reports mood is more stable - staying consistent with medications. Stating I feel like I'm doing alright.  Taking medications as prescribed.  Energy levels stable. Active, walking daily. Enjoys some usual interests and activities. Single. Lives with parents. Spending time with family.  Appetite adequate. Reports weight stable - 230 to 235 pounds. Sleeps better some nights than others. Averages 6 to 7 hours of broken sleep. Report some daytime napping - 1.5 hours. Focus and concentration stable. Diagnosed with ADHD in childhood. Completing tasks around the house. Looking for employment - put in an applications. Denies SI or HI.   Denies AH or VH.  Denies paranoia. Denies self harm. Denies substance use.  Previous medication: Chalmers, Lamictal    GAD-7    Flowsheet Row Office Visit from 02/09/2024 in [facility] at [facility]  Total GAD-7 Score 2   PHQ2-9    Flowsheet Row Office Visit from 02/09/2024 in [facility] at [facility] Office Visit from 03/24/2023 in [facility] at [place] ED from 05/16/2020 in [facility] Emergency Department at [facility]  PHQ-2 Total Score 0 0 6  PHQ-9 Total Score 4 0 17   Flowsheet Row UC from 01/24/2023 in [facility] at [facility]) UC from 12/25/2022 in [facility] at [facility]) ED from  04/19/2020 in [facility] Emergency Department at [facility]  C-SSRS RISK CATEGORY No Risk No Risk Error: Q2 is Yes, you must answer 3, 4, and 5     Review of Systems:  Review of Systems  Musculoskeletal:  Negative for gait problem.  Neurological:  Negative for tremors.  Psychiatric/Behavioral:         Please refer to HPI    Medications: I have reviewed the patient's current medications.  Current Outpatient Medications  Medication Sig Dispense Refill   Ascorbic Acid  (VITAMIN C) 1000 MG tablet Take 2,000 mg by mouth daily.     buPROPion  (WELLBUTRIN  XL) 300 MG 24 hr tablet Take 1 tablet (300 mg total) by mouth daily. 30 tablet 5   cholecalciferol  (VITAMIN D3) 25 MCG (1000 UNIT) tablet Take 2,000 Units by mouth daily.     fluticasone  (FLONASE ) 50 MCG/ACT nasal spray Place 2 sprays into both nostrils daily. 16 g 0   methylphenidate  (CONCERTA ) 36 MG PO CR tablet Take 1 tablet (36 mg total) by mouth 2 (two) times daily with breakfast and lunch. 60 tablet 0   [START ON 08/02/2024] methylphenidate  (CONCERTA ) 36 MG PO CR tablet Take one tablet in the morning and one tablet at lunch. 60 tablet 0   Multiple Vitamin (MULTIVITAMIN) capsule Take 1 capsule by mouth daily.     Omega-3 Fatty Acids (FISH OIL ) 1000 MG CAPS Take 1,000 mg by mouth daily.     perphenazine  (TRILAFON ) 8 MG tablet Take one tablet twice daily. 60 tablet 5   No current facility-administered medications for this visit.    Medication Side  Effects: None  Allergies: No Known Allergies  Past Medical History:  Diagnosis Date   Anxiety    Bipolar 1 disorder (HCC)    Depression    Mania (HCC)    Suicidal ideations     Past Medical History, Surgical history, Social history, and Family history were reviewed and updated as appropriate.   Please see review of systems for further details on the patient's review from today.   Objective:   Physical Exam:  There were no vitals taken for this visit.  Physical  Exam Constitutional:      General: He is not in acute distress. Musculoskeletal:        General: No deformity.  Neurological:     Mental Status: He is alert and oriented to person, place, and time.     Coordination: Coordination normal.  Psychiatric:        Attention and Perception: Attention and perception normal. He does not perceive auditory or visual hallucinations.        Mood and Affect: Mood normal. Mood is not anxious or depressed. Affect is not labile, blunt, angry or inappropriate.        Speech: Speech normal.        Behavior: Behavior normal.        Thought Content: Thought content normal. Thought content is not paranoid or delusional. Thought content does not include homicidal or suicidal ideation. Thought content does not include homicidal or suicidal plan.        Cognition and Memory: Cognition and memory normal.        Judgment: Judgment normal.     Comments: Insight intact     Lab Review:     Component Value Date/Time   NA 139 03/24/2023 1043   NA 141 12/25/2022 1303   K 4.0 03/24/2023 1043   CL 101 03/24/2023 1043   CO2 27 03/24/2023 1043   GLUCOSE 95 03/24/2023 1043   BUN 14 03/24/2023 1043   BUN 11 12/25/2022 1303   CREATININE 1.10 03/24/2023 1043   CALCIUM 9.8 03/24/2023 1043   PROT 7.6 03/24/2023 1043   PROT 7.0 12/25/2022 1303   ALBUMIN 4.7 03/24/2023 1043   ALBUMIN 4.6 12/25/2022 1303   AST 20 03/24/2023 1043   ALT 30 03/24/2023 1043   ALKPHOS 66 03/24/2023 1043   BILITOT 0.4 03/24/2023 1043   BILITOT 0.3 12/25/2022 1303   GFRNONAA >60 05/16/2020 0912   GFRAA >60 05/16/2020 0912       Component Value Date/Time   WBC 11.7 (H) 03/24/2023 1043   RBC 5.10 03/24/2023 1043   HGB 15.5 03/24/2023 1043   HGB 15.4 12/25/2022 1303   HCT 46.3 03/24/2023 1043   HCT 44.4 12/25/2022 1303   PLT 339.0 03/24/2023 1043   PLT 336 12/25/2022 1303   MCV 90.8 03/24/2023 1043   MCV 92 12/25/2022 1303   MCH 31.9 12/25/2022 1303   MCH 29.9 05/16/2020 0912    MCHC 33.4 03/24/2023 1043   RDW 13.1 03/24/2023 1043   RDW 12.4 12/25/2022 1303   LYMPHSABS 4.5 (H) 03/24/2023 1043   LYMPHSABS 3.3 (H) 12/25/2022 1303   MONOABS 0.7 03/24/2023 1043   EOSABS 0.2 03/24/2023 1043   EOSABS 0.1 12/25/2022 1303   BASOSABS 0.1 03/24/2023 1043   BASOSABS 0.1 12/25/2022 1303    No results found for: POCLITH, LITHIUM   No results found for: PHENYTOIN, PHENOBARB, VALPROATE, CBMZ   .res Assessment: Plan:    Plan:  Perphenazine  8mg  in am and 8mg   at bedtime  Wellbutrin  XL 300mg  daily Concerta  36mg  twice daily.   Monitor BP between visits while taking stimulant medication.  RTC 4 weeks  25 minutes spent dedicated to the care of this patient on the date of this encounter to include pre-visit review of records, ordering of medication, post visit documentation, and face-to-face time with the patient discussing MDD, GAD, insomnia, paranoia, and ADHD. Discussed continuing current medication regimen.  Discussed potential benefits, risks, and side effects of stimulants with patient to include increased heart rate, palpitations, insomnia, increased anxiety, increased irritability, or decreased appetite. Also discussed history of paranoia and to watch for any change in moods. Will also discuss changes with parents. Instructed patient to contact office if experiencing any significant tolerability issues.   Patient advised to contact office with any questions, adverse effects, or acute worsening in signs and symptoms.  Discussed potential metabolic side effects associated with atypical antipsychotics, as well as potential risk for movement side effects. Advised pt to contact office if movement side effects occur.   Diagnoses and all orders for this visit:  Major depressive disorder, recurrent episode, moderate degree (HCC) -     perphenazine  (TRILAFON ) 8 MG tablet; Take one tablet twice daily.  Insomnia, unspecified type -     buPROPion  (WELLBUTRIN  XL)  300 MG 24 hr tablet; Take 1 tablet (300 mg total) by mouth daily.  ADHD, predominantly inattentive type -     methylphenidate  (CONCERTA ) 36 MG PO CR tablet; Take 1 tablet (36 mg total) by mouth 2 (two) times daily with breakfast and lunch. -     methylphenidate  (CONCERTA ) 36 MG PO CR tablet; Take one tablet in the morning and one tablet at lunch.  GAD (generalized anxiety disorder)     Please see After Visit Summary for patient specific instructions.  Future Appointments  Date Time Provider Department Center  08/02/2024  8:00 AM Tayllor Breitenstein Nattalie, NP CP-CP None  09/06/2024  8:30 AM Diandra Cimini Nattalie, NP CP-[facility] None  10/04/2024  8:00 AM Yunuen Mordan Nattalie, NP CP-CP None    No orders of the defined types were placed in this encounter.   -------------------------------

## 2024-07-19 ENCOUNTER — Ambulatory Visit: Payer: Self-pay

## 2024-07-19 ENCOUNTER — Telehealth: Payer: Self-pay | Admitting: Adult Health

## 2024-07-19 ENCOUNTER — Encounter: Payer: Self-pay | Admitting: Internal Medicine

## 2024-07-19 NOTE — Telephone Encounter (Signed)
 Pt seen 9/15: Perphenazine  8mg  in am and 8mg  at bedtime  Wellbutrin  XL 300mg  daily Concerta  36mg  twice daily.   He is reporting an occasional left hand tremor, uncontrollable. 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 I asked if he was left hand dominant, thinking possibly carpal tunnel, but he said he is right handed. No other complaints.

## 2024-07-19 NOTE — Telephone Encounter (Signed)
 Pt states he is having hand  tremors but is not sure its from the meds. Wants a call back to discuss.

## 2024-07-19 NOTE — Telephone Encounter (Signed)
 FYI Only or Action Required?: FYI only for provider.  Patient was last seen in primary care on 02/09/2024 by Merlynn Niki FALCON, FNP.  Called Nurse Triage reporting Chest Pain.  Symptoms began a week ago.  Interventions attempted: Nothing.  Symptoms are: unchanged.  Triage Disposition: Home Care  Patient/caregiver understands and will follow disposition?: No  Copied from CRM [id number]. Topic: Clinical - Red Word Triage >> Jul 19, 2024  1:24 PM Adam Larsen wrote: Adam Larsen that prompted transfer to Nurse Triage: Patient has weird sensation in chest, when blowing nose he has pressure/pain in left ear, when he lays down he can hear heart beat and pressure in knee that's almost pain when going up and down stairs Reason for Disposition  Chest pain(s) lasting a few seconds  Answer Assessment - Initial Assessment Questions 1. LOCATION: Where does it hurt?      Middle left side of chest 2. RADIATION: Does the pain go anywhere else? (e.g., into neck, jaw, arms, back)     denies 3. ONSET: When did the chest pain begin? (Minutes, hours or days)      Ongoing for about a week 4. PATTERN: Does the pain come and go, or has it been constant since it started?  Does it get worse with exertion?      Intermittent, happens twice a day usually when supine 5. DURATION: How long does it last (e.g., seconds, minutes, hours)     Pt states that it lasts for a few seconds 6. SEVERITY: How bad is the pain?  (e.g., Scale 1-10; mild, moderate, or severe)     Mild, very very mild 7. CARDIAC RISK FACTORS: Do you have any history of heart problems or risk factors for heart disease? (e.g., angina, prior heart attack; diabetes, high blood pressure, high cholesterol, smoker, or strong family history of heart disease)     denies 8. PULMONARY RISK FACTORS: Do you have any history of lung disease?  (e.g., blood clots in lung, asthma, emphysema, birth control pills)     denies 9. CAUSE: What do you think  is causing the chest pain?     Unsure, wondering if it is my unhealthy habits catching up to me 10. OTHER SYMPTOMS: Do you have any other symptoms? (e.g., dizziness, nausea, vomiting, sweating, fever, difficulty breathing, cough)       denies   Pt states that he is on a stimulant BID as well as drinks large amounts of caffeine, and vapes. Denies difficulty breathing states that he does not have chest discomfort at time of call.  Protocols used: Chest Pain-A-AH

## 2024-07-20 ENCOUNTER — Ambulatory Visit (INDEPENDENT_AMBULATORY_CARE_PROVIDER_SITE_OTHER): Admitting: Internal Medicine

## 2024-07-20 VITALS — BP 134/80 | HR 89 | Temp 98.2°F | Ht 71.0 in | Wt 236.0 lb

## 2024-07-20 DIAGNOSIS — M25561 Pain in right knee: Secondary | ICD-10-CM | POA: Diagnosis not present

## 2024-07-20 DIAGNOSIS — R002 Palpitations: Secondary | ICD-10-CM | POA: Diagnosis not present

## 2024-07-20 DIAGNOSIS — R0789 Other chest pain: Secondary | ICD-10-CM | POA: Diagnosis not present

## 2024-07-20 DIAGNOSIS — H6992 Unspecified Eustachian tube disorder, left ear: Secondary | ICD-10-CM

## 2024-07-20 LAB — COMPREHENSIVE METABOLIC PANEL WITH GFR
ALT: 45 U/L (ref 0–53)
AST: 22 U/L (ref 0–37)
Albumin: 4.8 g/dL (ref 3.5–5.2)
Alkaline Phosphatase: 55 U/L (ref 39–117)
BUN: 12 mg/dL (ref 6–23)
CO2: 27 meq/L (ref 19–32)
Calcium: 9.9 mg/dL (ref 8.4–10.5)
Chloride: 103 meq/L (ref 96–112)
Creatinine, Ser: 1 mg/dL (ref 0.40–1.50)
GFR: 96.42 mL/min (ref >60.00)
Glucose, Bld: 103 mg/dL — ABNORMAL HIGH (ref 70–99)
Potassium: 4 meq/L (ref 3.5–5.1)
Sodium: 139 meq/L (ref 135–145)
Total Bilirubin: 0.4 mg/dL (ref 0.2–1.2)
Total Protein: 7.6 g/dL (ref 6.0–8.3)

## 2024-07-20 LAB — CBC
HCT: 44.5 % (ref 39.0–52.0)
Hemoglobin: 15.2 g/dL (ref 13.0–17.0)
MCHC: 34.2 g/dL (ref 30.0–36.0)
MCV: 90.3 fl (ref 78.0–100.0)
Platelets: 306 K/uL (ref 150.0–400.0)
RBC: 4.93 Mil/uL (ref 4.22–5.81)
RDW: 13.2 % (ref 11.5–15.5)
WBC: 9.5 K/uL (ref 4.0–10.5)

## 2024-07-20 LAB — TSH: TSH: 1.52 u[IU]/mL (ref 0.35–5.50)

## 2024-07-20 NOTE — Progress Notes (Addendum)
 Subjective:    Patient ID: Adam Bridgette Larsen., male    DOB: 06-23-1986, 38 y.o.   MRN: [id number]      HPI Adam is here for  Chief Complaint  Patient presents with   Chest discomfort    Discussed the use of AI scribe software for clinical note transcription with the patient, who gave verbal consent to proceed.  History of Present Illness Adam Larsen is a 38 year old male who presents with chest discomfort and right knee pain.  He has been experiencing intermittent chest discomfort for the past two weeks, described as a discomfort on the verge of pain, more pronounced when lying on his side. He can hear his heartbeat during these episodes. No radiation of the sensation, leg swelling, or changes in heart rate. No shortness of breath, coughing, or wheezing. He has a history of multiple EKGs, all of which have been normal.  No change in medications or supplements for the past few weeks.  He reports right knee pain specifically when walking downstairs, which began after he banged his knee on a table approximately six months ago and has progressively worsened. The pain is described as a pressure-like sensation. No pain during regular walking or in the calf area.  He experiences pressure in his left ear when blowing his nose, which he attributes to possible earwax buildup. No changes in hearing. He has allergies, which he manages with Flonase  and Allegra, although not consistently.  He mentions experiencing hand tremors in his left hand, which he has discussed with his psychiatrist. He is currently taking perphentazine, melatonin, and magnesium , with no recent changes in dosage or new medications. He reports broken sleep patterns over the past year, characterized by waking up two to three hours after falling asleep, although he is able to return to sleep. He has a history of bipolar disorder with a flare-up in July, which has improved.  He goes for walks and bike rides  occasionally, but is not exercising regularly.       Medications and allergies reviewed with patient and updated if appropriate.  Current Outpatient Medications on File Prior to Visit  Medication Sig Dispense Refill   Ascorbic Acid  (VITAMIN C) 1000 MG tablet Take 2,000 mg by mouth daily.     buPROPion  (WELLBUTRIN  XL) 300 MG 24 hr tablet Take 1 tablet (300 mg total) by mouth daily. 30 tablet 5   cholecalciferol  (VITAMIN D3) 25 MCG (1000 UNIT) tablet Take 2,000 Units by mouth daily.     fluticasone  (FLONASE ) 50 MCG/ACT nasal spray Place 2 sprays into both nostrils daily. 16 g 0   methylphenidate  (CONCERTA ) 36 MG PO CR tablet Take 1 tablet (36 mg total) by mouth 2 (two) times daily with breakfast and lunch. 60 tablet 0   [START ON 08/02/2024] methylphenidate  (CONCERTA ) 36 MG PO CR tablet Take one tablet in the morning and one tablet at lunch. 60 tablet 0   Multiple Vitamin (MULTIVITAMIN) capsule Take 1 capsule by mouth daily.     Omega-3 Fatty Acids (FISH OIL ) 1000 MG CAPS Take 1,000 mg by mouth daily.     perphenazine  (TRILAFON ) 8 MG tablet Take one tablet twice daily. 60 tablet 5   No current facility-administered medications on file prior to visit.    Review of Systems  Constitutional:  Negative for fever.  HENT:  Positive for congestion (mild - related to allergy) and ear pain (left when blowing nose). Negative for hearing loss.   Respiratory:  Negative for cough, shortness of breath and wheezing.   Cardiovascular:  Positive for chest pain (discomfort - not pain). Negative for palpitations and leg swelling.       Heart beating when he lays down - feels normal  Musculoskeletal:        No shoulder pain or neck pain  Neurological:  Negative for weakness, light-headedness, numbness and headaches.       Objective:   Vitals:   07/20/24 1022  BP: 134/80  Pulse: 89  Temp: 98.2 F (36.8 C)  SpO2: 96%   BP Readings from Last 3 Encounters:  07/20/24 134/80  02/09/24 120/72   03/24/23 120/74   Wt Readings from Last 3 Encounters:  07/20/24 236 lb (107 kg)  02/09/24 231 lb (104.8 kg)  03/24/23 223 lb 6 oz (101.3 kg)   Body mass index is 32.92 kg/m.    Physical Exam Constitutional:      General: He is not in acute distress.    Appearance: Normal appearance. He is not ill-appearing.  HENT:     Head: Normocephalic and atraumatic.     Ears:     Comments: Bilateral ear canals with mild amount of cerumen-dried and not obstructing, TMs bilaterally not visualized well.  Canals without any erythema or discharge Eyes:     Conjunctiva/sclera: Conjunctivae normal.  Neck:     Vascular: No carotid bruit.  Cardiovascular:     Rate and Rhythm: Normal rate and regular rhythm.     Heart sounds: Normal heart sounds.  Pulmonary:     Effort: Pulmonary effort is normal. No respiratory distress.     Breath sounds: Normal breath sounds. No wheezing or rales.  Chest:     Chest wall: Tenderness (Line tenderness left upper chest with palpation) present.  Musculoskeletal:        General: No tenderness (Posterior neck, left upper back, left shoulder and left upper arm).     Cervical back: Normal range of motion and neck supple.     Right lower leg: No edema.     Left lower leg: No edema.     Comments: Minimal tenderness anterior right knee, no patellar tenderness, no quadricep tenderness, posterior tenderness.  No obvious effusion  Skin:    General: Skin is warm and dry.     Findings: No rash.  Neurological:     Mental Status: He is alert. Mental status is at baseline.  Psychiatric:        Mood and Affect: Mood normal.        EKG: NSR at 83 bpm, normal EKG.  Compared to previous EKG-labs from 04/2020 has been no change.  Symptoms do not sound cardiac so no further evaluation necessary.    Assessment & Plan:    Assessment and Plan Assessment & Plan Musculoskeletal chest wall discomfort, awareness of heartbeats Intermittent left upper chest discomfort likely  musculoskeletal. Cardiac and pulmonary causes unlikely. - EKG today normal -Exam is normal - Order blood work to check blood counts, kidney function, liver tests, and thyroid function. - Reassured discomfort is likely musculoskeletal and unlikely cardiac, pulmonary or referred pain.  Could possibly be related to anxiety -Awareness of heartbeats-no rapid heartbeat or feeling of irregularity-I think what he is experiencing is normal.  Check blood work above.  If symptoms persist can consider further evaluation, but at this time I do not feel that is necessary  Right knee pain after trauma 6 months ago Chronic right knee pain localized to patella, likely due  to trauma-related inflammation or injury. - Refer to sports medicine for evaluation, possible ultrasound, and x-ray. - Advise icing the knee to reduce inflammation.  Eustachian tube dysfunction with left ear discomfort Intermittent left ear discomfort when blowing his nose likely due to Eustachian tube dysfunction from allergy-related sinus issues. -Ear exam with minimum cerumen otherwise normal - Continue Flonase  and Allegra for allergy management-try to take daily.      I personally spent a total of 20 minutes in the care of the patient today including getting/reviewing separately obtained history, performing a medically appropriate exam/evaluation, counseling and educating, placing orders, and documenting clinical information in the EHR.

## 2024-07-20 NOTE — Patient Instructions (Addendum)
      Blood work was ordered.   Your EKG is normal.               [id number] Use the flonase  and take allegra for your ear pain.    Medications changes include :   None    A referral was ordered sports medicine and someone will call you to schedule an appointment.     Return if symptoms worsen or fail to improve.

## 2024-07-21 MED ORDER — PROPRANOLOL HCL 10 MG PO TABS: 10.0000 mg | ORAL_TABLET | Freq: Two times a day (BID) | ORAL | 0 refills | Status: AC

## 2024-07-21 NOTE — Telephone Encounter (Signed)
 Rx sent to [facility] [address] [address].

## 2024-07-21 NOTE — Telephone Encounter (Signed)
 You had tried to call patient and was unable to reach him. He called back and repeated the same symptoms as previously, occasional left hand tremors. 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

## 2024-07-22 ENCOUNTER — Ambulatory Visit: Payer: Self-pay | Admitting: Internal Medicine

## 2024-07-23 NOTE — Progress Notes (Signed)
   I, Leotis Batter, CMA acting as a scribe for Artist Lloyd, MD.  Lean Fayson. is a 38 y.o. male who presents to [facility] at [facility] today for R knee pain x 6 months. Pain started after he banged his knee on a table. Pt locates pain to distal to patella. Sx worse with stairs. Denies swelling or mechanical sx. Sx progressively worsening over the past couple of weeks.   R Knee swelling: denies Mechanical symptoms: denies  Aggravates: stairs Treatments tried: none  Pertinent review of systems: No fevers or chills  Relevant historical information: Bipolar disorder currently controlled.  Social determinants of health: Patient is currently not working and currently living with his parents.   Exam:  BP 104/82   Pulse 85   Ht 5' 11 (1.803 m)   Wt 235 lb (106.6 kg)   SpO2 98%   BMI 32.78 kg/m  General: Well Developed, well nourished, and in no acute distress.   MSK: Right knee normal-appearing Tender palpation proximal patellar tendon origin from inferior patellar pole. Intact strength pain with resisted knee extension.    Lab and Radiology Results  Diagnostic Limited MSK Ultrasound of: Right knee Quad tendon is intact and normal-appearing. Patellar tendon is intact with some calcific change at the proximal patellar tendon origin.  The patellar tendon at this area is slightly increased thickness compared to the more distal tendon which is typical for chronic tendinopathy. Impression: Chronic calcific patellar tendinitis      Assessment and Plan: 38 y.o. male with right knee pain due to chronic tendinitis of the patellar tendon with some calcific change.  Plan for home exercise program and Voltaren gel.  If not better consider physical therapy referral and nitroglycerin patches. Patient will keep me updated with how he is feeling.  PDMP not reviewed this encounter. Orders Placed This Encounter  Procedures   US  LIMITED JOINT SPACE STRUCTURES  LOW LEFT(NO LINKED CHARGES)    Reason for Exam (SYMPTOM  OR DIAGNOSIS REQUIRED):   left knee pain    Preferred imaging location?:   [facility]   No orders of the defined types were placed in this encounter.    Discussed warning signs or symptoms. Please see discharge instructions. Patient expresses understanding.   The above documentation has been reviewed and is accurate and complete Artist Lloyd, M.D.

## 2024-07-26 ENCOUNTER — Encounter: Payer: Self-pay | Admitting: Family Medicine

## 2024-07-26 ENCOUNTER — Ambulatory Visit: Admitting: Family Medicine

## 2024-07-26 ENCOUNTER — Other Ambulatory Visit: Payer: Self-pay

## 2024-07-26 VITALS — BP 104/82 | HR 85 | Ht 71.0 in | Wt 235.0 lb

## 2024-07-26 DIAGNOSIS — M25562 Pain in left knee: Secondary | ICD-10-CM | POA: Diagnosis not present

## 2024-07-26 DIAGNOSIS — G8929 Other chronic pain: Secondary | ICD-10-CM

## 2024-07-26 NOTE — Patient Instructions (Addendum)
 Thank you for coming in today.   Please use Voltaren gel (Generic Diclofenac Gel) up to 4x daily for pain as needed.  This is available over-the-counter as both the name brand Voltaren gel and the generic diclofenac gel.   Please work on the home exercises the athletic trainer went over with you:  View at [email or web address] code [email or web address]  See you back as needed.

## 2024-08-02 ENCOUNTER — Encounter: Payer: Self-pay | Admitting: Adult Health

## 2024-08-02 ENCOUNTER — Other Ambulatory Visit: Payer: Self-pay

## 2024-08-02 ENCOUNTER — Ambulatory Visit: Admitting: Adult Health

## 2024-08-02 DIAGNOSIS — F411 Generalized anxiety disorder: Secondary | ICD-10-CM | POA: Diagnosis not present

## 2024-08-02 DIAGNOSIS — F22 Delusional disorders: Secondary | ICD-10-CM | POA: Diagnosis not present

## 2024-08-02 DIAGNOSIS — G4700 Insomnia, unspecified: Secondary | ICD-10-CM | POA: Diagnosis not present

## 2024-08-02 DIAGNOSIS — F900 Attention-deficit hyperactivity disorder, predominantly inattentive type: Secondary | ICD-10-CM

## 2024-08-02 DIAGNOSIS — F331 Major depressive disorder, recurrent, moderate: Secondary | ICD-10-CM | POA: Diagnosis not present

## 2024-08-02 MED ORDER — METHYLPHENIDATE HCL ER (OSM) 36 MG PO TBCR: EXTENDED_RELEASE_TABLET | ORAL | 0 refills | Status: DC

## 2024-08-02 MED ORDER — BUPROPION HCL ER (XL) 300 MG PO TB24: 300.0000 mg | ORAL_TABLET | Freq: Every day | ORAL | 5 refills | Status: DC

## 2024-08-02 MED ORDER — METHYLPHENIDATE HCL ER (OSM) 36 MG PO TBCR: 36.0000 mg | EXTENDED_RELEASE_TABLET | Freq: Two times a day (BID) | ORAL | 0 refills | Status: DC

## 2024-08-02 NOTE — Progress Notes (Signed)
 Rx sent to [facility] [address]. Adam Larsen [id number] 07-03-1986 38 y.o.  Subjective:   Patient ID:  Adam Larsen. is a 38 y.o. (DOB [birth year]) male.  Chief Complaint: No chief complaint on file.   HPI Adam Larsen. presents to the office today for follow-up of MDD, GAD, insomnia, paranoia and ADHD.  Describes mood today as ok. Pleasant. Denies tearfulness. Mood symptoms - denies anxiety, depression and irritability. Reports stable interest and motivation - a little better than usual. Denies panic attacks. Reports some worry, rumination and over thinking - a little bit. Reports mood as pretty stable. Stating I feel like I'm doing alright. Taking medications as prescribed.  Energy levels stable. Active, walking daily. Enjoys some usual interests and activities. Single. Lives with parents. Spending time with family.  Appetite adequate. Reports weight stable - 235 pounds. Sleeps better some nights than others. Averages 6 hours of broken sleep - sleeping in 2 to 3 hour blocks. Report some daytime nap - 1 to 1.5 hours. Focus and concentration stable. Diagnosed with ADHD in childhood. Completing tasks around the house. Looking for employment - putting in applications. Denies SI or HI.   Denies AH or VH.  Denies paranoia. Denies self harm. Denies substance use.  Previous medication: [place], Lamictal    GAD-7    Flowsheet Row Office Visit from 02/09/2024 in [facility]  Total GAD-7 Score 2   PHQ2-9    Flowsheet Row Office Visit from 07/20/2024 in [facility] Office Visit from 02/09/2024 in [facility] Office Visit from 03/24/2023 in [facility] [place] ED from 05/16/2020 in [facility] Department at [facility]  PHQ-2 Total Score 0 0 0 6  PHQ-9 Total Score 5 4 0 17   Flowsheet Row UC from 01/24/2023 in [facility] UC from 12[facility]25/2022 in [facility]) ED from 04/19/2020 in [facility] Department at [facility]  C-SSRS RISK CATEGORY No Risk No Risk Error: Q2 is Yes, you must answer 3, 4, and 5     Review of Systems:  Review of Systems  Musculoskeletal:  Negative for gait problem.  Neurological:  Negative for tremors.  Psychiatric/Behavioral:         Please refer to HPI    Medications: I have reviewed the patient's current medications.  Current Outpatient Medications  Medication Sig Dispense Refill   Ascorbic Acid  (VITAMIN C) 1000 MG tablet Take 2,000 mg by mouth daily.     buPROPion  (WELLBUTRIN  XL) 300 MG 24 hr tablet Take 1 tablet (300 mg total) by mouth daily. 30 tablet 5   cholecalciferol  (VITAMIN D3) 25 MCG (1000 UNIT) tablet Take 2,000 Units by mouth daily.     fluticasone  (FLONASE ) 50 MCG/ACT nasal spray Place 2 sprays into both nostrils daily. 16 g 0   methylphenidate  (CONCERTA ) 36 MG PO CR tablet Take 1 tablet (36 mg total) by mouth 2 (two) times daily with breakfast and lunch. 60 tablet 0   methylphenidate  (CONCERTA ) 36 MG PO CR tablet Take one tablet in the morning and one tablet at lunch. 60 tablet 0   Multiple Vitamin (MULTIVITAMIN) capsule Take 1 capsule by mouth daily.     Omega-3 Fatty Acids (FISH OIL ) 1000 MG CAPS Take 1,000 mg by mouth daily.     perphenazine  (TRILAFON ) 8 MG tablet  Take one tablet twice daily. 60 tablet 5   propranolol (INDERAL) 10 MG tablet Take 1 tablet (10 mg total) by mouth 2 (two) times daily. 60 tablet 0   No current facility-administered medications for this visit.    Medication Side Effects: None  Allergies: No Known Allergies  Past Medical History:  Diagnosis Date   Anxiety    Bipolar 1 disorder (HCC)    Depression    Mania (HCC)    Suicidal ideations     Past Medical History, Surgical history, Social history, and Family history were reviewed and  updated as appropriate.   Please see review of systems for further details on the patient's review from today.   Objective:   Physical Exam:  There were no vitals taken for this visit.  Physical Exam Constitutional:      General: He is not in acute distress. Musculoskeletal:        General: No deformity.  Neurological:     Mental Status: He is alert and oriented to person, place, and time.     Coordination: Coordination normal.  Psychiatric:        Attention and Perception: Attention and perception normal. He does not perceive auditory or visual hallucinations.        Mood and Affect: Mood normal. Mood is not anxious or depressed. Affect is not labile, blunt, angry or inappropriate.        Speech: Speech normal.        Behavior: Behavior normal.        Thought Content: Thought content normal. Thought content is not paranoid or delusional. Thought content does not include homicidal or suicidal ideation. Thought content does not include homicidal or suicidal plan.        Cognition and Memory: Cognition and memory normal.        Judgment: Judgment normal.     Comments: Insight intact     Lab Review:     Component Value Date/Time   NA 139 07/20/2024 1059   NA 141 12/25/2022 1303   K 4.0 07/20/2024 1059   CL 103 07/20/2024 1059   CO2 27 07/20/2024 1059   GLUCOSE 103 (H) 07/20/2024 1059   BUN 12 07/20/2024 1059   BUN 11 12/25/2022 1303   CREATININE 1.00 07/20/2024 1059   CALCIUM 9.9 07/20/2024 1059   PROT 7.6 07/20/2024 1059   PROT 7.0 12/25/2022 1303   ALBUMIN 4.8 07/20/2024 1059   ALBUMIN 4.6 12/25/2022 1303   AST 22 07/20/2024 1059   ALT 45 07/20/2024 1059   ALKPHOS 55 07/20/2024 1059   BILITOT 0.4 07/20/2024 1059   BILITOT 0.3 12/25/2022 1303   GFRNONAA >60 05/16/2020 0912   GFRAA >60 05/16/2020 0912       Component Value Date/Time   WBC 9.5 07/20/2024 1059   RBC 4.93 07/20/2024 1059   HGB 15.2 07/20/2024 1059   HGB 15.4 12/25/2022 1303   HCT 44.5 07/20/2024  1059   HCT 44.4 12/25/2022 1303   PLT 306.0 07/20/2024 1059   PLT 336 12/25/2022 1303   MCV 90.3 07/20/2024 1059   MCV 92 12/25/2022 1303   MCH 31.9 12/25/2022 1303   MCH 29.9 05/16/2020 0912   MCHC 34.2 07/20/2024 1059   RDW 13.2 07/20/2024 1059   RDW 12.4 12/25/2022 1303   LYMPHSABS 4.5 (H) 03/24/2023 1043   LYMPHSABS 3.3 (H) 12/25/2022 1303   MONOABS 0.7 03/24/2023 1043   EOSABS 0.2 03/24/2023 1043   EOSABS 0.1 12/25/2022 1303   BASOSABS  0.1 03/24/2023 1043   BASOSABS 0.1 12/25/2022 1303    No results found for: POCLITH, LITHIUM   No results found for: PHENYTOIN, PHENOBARB, VALPROATE, CBMZ   .res Assessment: Plan:    Plan:  Perphenazine  8mg  in am and 8mg  at bedtime  Wellbutrin  XL 300mg  daily Concerta  36mg  twice daily Propranolol - added between visits for hand tremor  Monitor BP between visits while taking stimulant medication.  RTC 4 weeks  25 minutes spent dedicated to the care of this patient on the date of this encounter to include pre-visit review of records, ordering of medication, post visit documentation, and face-to-face time with the patient discussing MDD, GAD, insomnia, paranoia, and ADHD. Discussed continuing current medication regimen.  Discussed potential benefits, risks, and side effects of stimulants with patient to include increased heart rate, palpitations, insomnia, increased anxiety, increased irritability, or decreased appetite. Also discussed history of paranoia and to watch for any change in moods. Will also discuss changes with parents. Instructed patient to contact office if experiencing any significant tolerability issues.   Patient advised to contact office with any questions, adverse effects, or acute worsening in signs and symptoms.  Discussed potential metabolic side effects associated with atypical antipsychotics, as well as potential risk for movement side effects. Advised pt to contact office if movement side effects occur.    There are no diagnoses linked to this encounter.   Please see After Visit Summary for patient specific instructions.  Future Appointments  Date Time Provider Department Center  08/02/2024  8:00 AM Shahil Speegle [place], NP [facility] None  09/06/2024  8:30 AM Johnette Teigen [place], NP [facility] None  10/04/2024  8:00 AM Yadier Bramhall [place], NP [facility] None    No orders of the defined types were placed in this encounter.   -------------------------------

## 2024-09-06 ENCOUNTER — Ambulatory Visit: Admitting: Adult Health

## 2024-09-06 ENCOUNTER — Encounter: Payer: Self-pay | Admitting: Adult Health

## 2024-09-06 DIAGNOSIS — F22 Delusional disorders: Secondary | ICD-10-CM | POA: Diagnosis not present

## 2024-09-06 DIAGNOSIS — F411 Generalized anxiety disorder: Secondary | ICD-10-CM | POA: Diagnosis not present

## 2024-09-06 DIAGNOSIS — G4700 Insomnia, unspecified: Secondary | ICD-10-CM | POA: Diagnosis not present

## 2024-09-06 DIAGNOSIS — F900 Attention-deficit hyperactivity disorder, predominantly inattentive type: Secondary | ICD-10-CM

## 2024-09-06 DIAGNOSIS — F331 Major depressive disorder, recurrent, moderate: Secondary | ICD-10-CM | POA: Diagnosis not present

## 2024-09-06 MED ORDER — BUPROPION HCL ER (XL) 300 MG PO TB24: 300.0000 mg | ORAL_TABLET | Freq: Every day | ORAL | 5 refills | Status: DC

## 2024-09-06 MED ORDER — METHYLPHENIDATE HCL ER (OSM) 36 MG PO TBCR: 36.0000 mg | EXTENDED_RELEASE_TABLET | Freq: Two times a day (BID) | ORAL | 0 refills | Status: DC

## 2024-09-06 NOTE — Progress Notes (Signed)
 Adam Larsen [id number] 02-26-1986 38 y.o.  Subjective:   Patient ID:  Adam Larsen. is a 38 y.o. (DOB [birth year]) male.  Chief Complaint: No chief complaint on file.   HPI Adam Larsen. presents to the office today for follow-up of MDD, GAD, insomnia, paranoia and ADHD.  Describes mood today as ok. Pleasant. Denies tearfulness. Mood symptoms - denies anxiety, depression and irritability. Reports stable interest and motivation - trying to get back into the [place] of things. Denies panic attacks. Denies worry, rumination and over thinking. Reports mood as stable. Stating I feel like I'm doing ok. Taking medications as prescribed.  Energy levels stable. Active, does not have a regular exercise routine. Enjoys some usual interests and activities. Single. Lives with parents. Spending time with family.  Appetite adequate. Reports weight stable - 230 from 235 pounds. Sleeps better some nights than others. Averages 6 hours of broken sleep - sleeping in 2 to 3 hour blocks. Report some daytime napoing - 1 to 1.5 hours. Focus and concentration stable. Diagnosed with ADHD in childhood. Completing tasks around the house. Currently unemployed - he did get a job at [employer] and quit after 3 weeks - inadequate training and support. Plans to continue looking.  Denies SI or HI.   Denies AH or VH.  Denies paranoia. Denies self harm. Denies substance use.  Previous medication: Chalmers, Lamictal    GAD-7    Flowsheet Row Office Visit from 02/09/2024 in [facility] at [facility]  Total GAD-7 Score 2   PHQ2-9    Flowsheet Row Office Visit from 07/20/2024 in [facility] at [facility] Office Visit from 02/09/2024 in [facility] at [facility] Office Visit from 03/24/2023 in [facility] at [place] ED from 05/16/2020 in [facility] Emergency Department at [facility]  PHQ-2 Total Score 0 0 0 6  PHQ-9  Total Score 5 4 0 17   Flowsheet Row UC from 01/24/2023 in [facility] at [facility]) UC from 12/25/2022 in [facility] at [facility]) ED from 04/19/2020 in [facility] Department at [facility]  C-SSRS RISK CATEGORY No Risk No Risk Error: Q2 is Yes, you must answer 3, 4, and 5     Review of Systems:  Review of Systems  Musculoskeletal:  Negative for gait problem.  Neurological:  Negative for tremors.  Psychiatric/Behavioral:         Please refer to HPI    Medications: I have reviewed the patient's current medications.  Current Outpatient Medications  Medication Sig Dispense Refill   Ascorbic Acid  (VITAMIN C) 1000 MG tablet Take 2,000 mg by mouth daily.     buPROPion  (WELLBUTRIN  XL) 300 MG 24 hr tablet Take 1 tablet (300 mg total) by mouth daily. 30 tablet 5   cholecalciferol  (VITAMIN D3) 25 MCG (1000 UNIT) tablet Take 2,000 Units by mouth daily.     fluticasone  (FLONASE ) 50 MCG/ACT nasal spray Place 2 sprays into both nostrils daily. 16 g 0   methylphenidate  (CONCERTA ) 36 MG PO CR tablet Take one tablet in the morning and one tablet at lunch. 60 tablet 0   methylphenidate  (CONCERTA ) 36 MG PO CR tablet Take 1 tablet (36 mg total) by mouth 2 (two) times daily with breakfast and lunch. 60 tablet 0   Multiple Vitamin (MULTIVITAMIN) capsule Take 1 capsule by mouth daily.     Omega-3 Fatty Acids (FISH OIL ) 1000  MG CAPS Take 1,000 mg by mouth daily.     perphenazine  (TRILAFON ) 8 MG tablet Take one tablet twice daily. 60 tablet 5   propranolol (INDERAL) 10 MG tablet Take 1 tablet (10 mg total) by mouth 2 (two) times daily. 60 tablet 0   No current facility-administered medications for this visit.    Medication Side Effects: None  Allergies: No Known Allergies  Past Medical History:  Diagnosis Date   Anxiety    Bipolar 1 disorder (HCC)    Depression    Mania (HCC)    Suicidal ideations     Past Medical  History, Surgical history, Social history, and Family history were reviewed and updated as appropriate.   Please see review of systems for further details on the patient's review from today.   Objective:   Physical Exam:  There were no vitals taken for this visit.  Physical Exam Constitutional:      General: He is not in acute distress. Musculoskeletal:        General: No deformity.  Neurological:     Mental Status: He is alert and oriented to person, place, and time.     Coordination: Coordination normal.  Psychiatric:        Attention and Perception: Attention and perception normal. He does not perceive auditory or visual hallucinations.        Mood and Affect: Mood normal. Mood is not anxious or depressed. Affect is not labile, blunt, angry or inappropriate.        Speech: Speech normal.        Behavior: Behavior normal.        Thought Content: Thought content normal. Thought content is not paranoid or delusional. Thought content does not include homicidal or suicidal ideation. Thought content does not include homicidal or suicidal plan.        Cognition and Memory: Cognition and memory normal.        Judgment: Judgment normal.     Comments: Insight intact     Lab Review:     Component Value Date/Time   NA 139 07/20/2024 1059   NA 141 12/25/2022 1303   K 4.0 07/20/2024 1059   CL 103 07/20/2024 1059   CO2 27 07/20/2024 1059   GLUCOSE 103 (H) 07/20/2024 1059   BUN 12 07/20/2024 1059   BUN 11 12/25/2022 1303   CREATININE 1.00 07/20/2024 1059   CALCIUM 9.9 07/20/2024 1059   PROT 7.6 07/20/2024 1059   PROT 7.0 12/25/2022 1303   ALBUMIN 4.8 07/20/2024 1059   ALBUMIN 4.6 12/25/2022 1303   AST 22 07/20/2024 1059   ALT 45 07/20/2024 1059   ALKPHOS 55 07/20/2024 1059   BILITOT 0.4 07/20/2024 1059   BILITOT 0.3 12/25/2022 1303   GFRNONAA >60 05/16/2020 0912   GFRAA >60 05/16/2020 0912       Component Value Date/Time   WBC 9.5 07/20/2024 1059   RBC 4.93 07/20/2024  1059   HGB 15.2 07/20/2024 1059   HGB 15.4 12/25/2022 1303   HCT 44.5 07/20/2024 1059   HCT 44.4 12/25/2022 1303   PLT 306.0 07/20/2024 1059   PLT 336 12/25/2022 1303   MCV 90.3 07/20/2024 1059   MCV 92 12/25/2022 1303   MCH 31.9 12/25/2022 1303   MCH 29.9 05/16/2020 0912   MCHC 34.2 07/20/2024 1059   RDW 13.2 07/20/2024 1059   RDW 12.4 12/25/2022 1303   LYMPHSABS 4.5 (H) 03/24/2023 1043   LYMPHSABS 3.3 (H) 12/25/2022 1303   MONOABS 0.7  03/24/2023 1043   EOSABS 0.2 03/24/2023 1043   EOSABS 0.1 12/25/2022 1303   BASOSABS 0.1 03/24/2023 1043   BASOSABS 0.1 12/25/2022 1303    No results found for: POCLITH, LITHIUM   No results found for: PHENYTOIN, PHENOBARB, VALPROATE, CBMZ   .res Assessment: Plan:    Plan:  Perphenazine  8mg  in am and 8mg  at bedtime  Wellbutrin  XL 300mg  daily Concerta  36mg  twice daily Propranolol - added between visits for hand tremor  Monitor BP between visits while taking stimulant medication.  RTC 4 weeks  25 minutes spent dedicated to the care of this patient on the date of this encounter to include pre-visit review of records, ordering of medication, post visit documentation, and face-to-face time with the patient discussing MDD, GAD, insomnia, paranoia, and ADHD. Discussed continuing current medication regimen.  Discussed potential benefits, risks, and side effects of stimulants with patient to include increased heart rate, palpitations, insomnia, increased anxiety, increased irritability, or decreased appetite. Also discussed history of paranoia and to watch for any change in moods. Will also discuss changes with parents. Instructed patient to contact office if experiencing any significant tolerability issues.   Patient advised to contact office with any questions, adverse effects, or acute worsening in signs and symptoms.  Discussed potential metabolic side effects associated with atypical antipsychotics, as well as potential risk for  movement side effects. Advised pt to contact office if movement side effects occur.   Diagnoses and all orders for this visit:  Major depressive disorder, recurrent episode, moderate degree (HCC)  GAD (generalized anxiety disorder)  Insomnia, unspecified type -     buPROPion  (WELLBUTRIN  XL) 300 MG 24 hr tablet; Take 1 tablet (300 mg total) by mouth daily.  ADHD, predominantly inattentive type -     methylphenidate  (CONCERTA ) 36 MG PO CR tablet; Take 1 tablet (36 mg total) by mouth 2 (two) times daily with breakfast and lunch.  Paranoia (HCC)     Please see After Visit Summary for patient specific instructions.  Future Appointments  Date Time Provider Department Center  10/04/2024  8:00 AM Adam Thoma Nattalie, NP CP-CP None    No orders of the defined types were placed in this encounter.   -------------------------------

## 2024-10-04 ENCOUNTER — Encounter: Payer: Self-pay | Admitting: Adult Health

## 2024-10-04 ENCOUNTER — Ambulatory Visit: Admitting: Adult Health

## 2024-10-04 DIAGNOSIS — F22 Delusional disorders: Secondary | ICD-10-CM | POA: Diagnosis not present

## 2024-10-04 DIAGNOSIS — F909 Attention-deficit hyperactivity disorder, unspecified type: Secondary | ICD-10-CM | POA: Diagnosis not present

## 2024-10-04 DIAGNOSIS — F331 Major depressive disorder, recurrent, moderate: Secondary | ICD-10-CM | POA: Diagnosis not present

## 2024-10-04 DIAGNOSIS — F411 Generalized anxiety disorder: Secondary | ICD-10-CM

## 2024-10-04 DIAGNOSIS — G4700 Insomnia, unspecified: Secondary | ICD-10-CM | POA: Diagnosis not present

## 2024-10-04 DIAGNOSIS — F900 Attention-deficit hyperactivity disorder, predominantly inattentive type: Secondary | ICD-10-CM

## 2024-10-04 MED ORDER — BUPROPION HCL ER (XL) 300 MG PO TB24: 300.0000 mg | ORAL_TABLET | Freq: Every day | ORAL | 5 refills | Status: AC

## 2024-10-04 MED ORDER — METHYLPHENIDATE HCL ER (OSM) 36 MG PO TBCR: 36.0000 mg | EXTENDED_RELEASE_TABLET | Freq: Two times a day (BID) | ORAL | 0 refills | Status: AC

## 2024-10-04 MED ORDER — PERPHENAZINE 8 MG PO TABS: ORAL_TABLET | ORAL | 5 refills | Status: AC

## 2024-10-04 MED ORDER — METHYLPHENIDATE HCL ER (OSM) 36 MG PO TBCR: EXTENDED_RELEASE_TABLET | ORAL | 0 refills | Status: AC

## 2024-10-04 NOTE — Progress Notes (Signed)
 Adam Larsen [id number] 1986/06/12 38 y.o.  Subjective:   Patient ID:  Adam Larsen. is a 38 y.o. (DOB 1985-11-23) male.  Chief Complaint: No chief complaint on file.   HPI Adam Larsen. presents to the office today for follow-up of MDD, GAD, insomnia, paranoia and ADHD.  Describes mood today as ok. Pleasant. Denies tearfulness. Mood symptoms - denies anxiety, depression and irritability. Reports lower interest and motivation. Denies panic attacks. Reports some worry, rumination and over thinking. Reports mood as stable. Stating I feel like I'm doing pretty good. Taking medications as prescribed.  Energy levels stable. Active, does not have a regular exercise routine. Enjoys some usual interests and activities. Single. Lives with parents. Spending time with family.  Appetite adequate. Reports weight gain with the holidays - 230 from 235 pounds. Sleeps better some nights than others. Averages 6 hours of broken sleep - sleeping in 2 to 3 hour blocks. Report some daytime napoing - 1 to 1.5 hours. Focus and concentration stable. Diagnosed with ADHD in childhood. Completing tasks around the house. Currently unemployed.  Denies SI or HI.   Denies AH or VH.  Denies paranoia. Denies self harm. Denies substance use.  Previous medication: Chalmers, Lamictal    GAD-7    Flowsheet Row Office Visit from 02/09/2024 in [facility] at [facility]  Total GAD-7 Score 2   PHQ2-9    Flowsheet Row Office Visit from 07/20/2024 in [facility] at [facility] Office Visit from 02/09/2024 in [facility] at [facility] Office Visit from 03/24/2023 in [facility] at [place] ED from 05/16/2020 in [facility] Emergency Department at [facility]  PHQ-2 Total Score 0 0 0 6  PHQ-9 Total Score 5 4 0 17   Flowsheet Row UC from 01/24/2023 in [facility]) UC from  12/25/2022 in [facility] at [facility]) ED from 04/19/2020 in [facility] Emergency Department at [facility]  C-SSRS RISK CATEGORY No Risk No Risk Error: Q2 is Yes, you must answer 3, 4, and 5     Review of Systems:  Review of Systems  Medications: I have reviewed the patient's current medications.  Current Outpatient Medications  Medication Sig Dispense Refill   Ascorbic Acid  (VITAMIN C) 1000 MG tablet Take 2,000 mg by mouth daily.     buPROPion  (WELLBUTRIN  XL) 300 MG 24 hr tablet Take 1 tablet (300 mg total) by mouth daily. 30 tablet 5   cholecalciferol  (VITAMIN D3) 25 MCG (1000 UNIT) tablet Take 2,000 Units by mouth daily.     fluticasone  (FLONASE ) 50 MCG/ACT nasal spray Place 2 sprays into both nostrils daily. 16 g 0   methylphenidate  (CONCERTA ) 36 MG PO CR tablet Take one tablet in the morning and one tablet at lunch. 60 tablet 0   methylphenidate  (CONCERTA ) 36 MG PO CR tablet Take 1 tablet (36 mg total) by mouth 2 (two) times daily with breakfast and lunch. 60 tablet 0   Multiple Vitamin (MULTIVITAMIN) capsule Take 1 capsule by mouth daily.     Omega-3 Fatty Acids (FISH OIL ) 1000 MG CAPS Take 1,000 mg by mouth daily.     perphenazine  (TRILAFON ) 8 MG tablet Take one tablet twice daily. 60 tablet 5   propranolol  (INDERAL ) 10 MG tablet Take 1 tablet (10 mg total) by mouth 2 (two) times daily. 60 tablet 0   No current facility-administered medications for this visit.  Medication Side Effects: None  Allergies: Allergies[1]  Past Medical History:  Diagnosis Date   Anxiety    Bipolar 1 disorder (HCC)    Depression    Mania (HCC)    Suicidal ideations     Past Medical History, Surgical history, Social history, and Family history were reviewed and updated as appropriate.   Please see review of systems for further details on the patient's review from today.   Objective:   Physical Exam:  There were no vitals taken for this  visit.  Physical Exam Constitutional:      General: He is not in acute distress. Musculoskeletal:        General: No deformity.  Neurological:     Mental Status: He is alert and oriented to person, place, and time.     Coordination: Coordination normal.  Psychiatric:        Attention and Perception: Attention and perception normal. He does not perceive auditory or visual hallucinations.        Mood and Affect: Mood normal. Mood is not anxious or depressed. Affect is not labile, blunt, angry or inappropriate.        Speech: Speech normal.        Behavior: Behavior normal.        Thought Content: Thought content normal. Thought content is not paranoid or delusional. Thought content does not include homicidal or suicidal ideation. Thought content does not include homicidal or suicidal plan.        Cognition and Memory: Cognition and memory normal.        Judgment: Judgment normal.     Comments: Insight intact     Lab Review:     Component Value Date/Time   NA 139 07/20/2024 1059   NA 141 12/25/2022 1303   K 4.0 07/20/2024 1059   CL 103 07/20/2024 1059   CO2 27 07/20/2024 1059   GLUCOSE 103 (H) 07/20/2024 1059   BUN 12 07/20/2024 1059   BUN 11 12/25/2022 1303   CREATININE 1.00 07/20/2024 1059   CALCIUM 9.9 07/20/2024 1059   PROT 7.6 07/20/2024 1059   PROT 7.0 12/25/2022 1303   ALBUMIN 4.8 07/20/2024 1059   ALBUMIN 4.6 12/25/2022 1303   AST 22 07/20/2024 1059   ALT 45 07/20/2024 1059   ALKPHOS 55 07/20/2024 1059   BILITOT 0.4 07/20/2024 1059   BILITOT 0.3 12/25/2022 1303   GFRNONAA >60 05/16/2020 0912   GFRAA >60 05/16/2020 0912       Component Value Date/Time   WBC 9.5 07/20/2024 1059   RBC 4.93 07/20/2024 1059   HGB 15.2 07/20/2024 1059   HGB 15.4 12/25/2022 1303   HCT 44.5 07/20/2024 1059   HCT 44.4 12/25/2022 1303   PLT 306.0 07/20/2024 1059   PLT 336 12/25/2022 1303   MCV 90.3 07/20/2024 1059   MCV 92 12/25/2022 1303   MCH 31.9 12/25/2022 1303   MCH 29.9  05/16/2020 0912   MCHC 34.2 07/20/2024 1059   RDW 13.2 07/20/2024 1059   RDW 12.4 12/25/2022 1303   LYMPHSABS 4.5 (H) 03/24/2023 1043   LYMPHSABS 3.3 (H) 12/25/2022 1303   MONOABS 0.7 03/24/2023 1043   EOSABS 0.2 03/24/2023 1043   EOSABS 0.1 12/25/2022 1303   BASOSABS 0.1 03/24/2023 1043   BASOSABS 0.1 12/25/2022 1303    No results found for: POCLITH, LITHIUM   No results found for: PHENYTOIN, PHENOBARB, VALPROATE, CBMZ   .res Assessment: Plan:    Plan:  Perphenazine  8mg  in am and 8mg   at bedtime  Wellbutrin  XL 300mg  daily Concerta  36mg  twice daily Propranolol  - added between visits for hand tremor  Monitor BP between visits while taking stimulant medication.  RTC 4 weeks  25 minutes spent dedicated to the care of this patient on the date of this encounter to include pre-visit review of records, ordering of medication, post visit documentation, and face-to-face time with the patient discussing MDD, GAD, insomnia, paranoia, and ADHD. Discussed continuing current medication regimen.  Discussed potential benefits, risks, and side effects of stimulants with patient to include increased heart rate, palpitations, insomnia, increased anxiety, increased irritability, or decreased appetite. Also discussed history of paranoia and to watch for any change in moods. Will also discuss changes with parents. Instructed patient to contact office if experiencing any significant tolerability issues.   Patient advised to contact office with any questions, adverse effects, or acute worsening in signs and symptoms.  Discussed potential metabolic side effects associated with atypical antipsychotics, as well as potential risk for movement side effects. Advised pt to contact office if movement side effects occur.   There are no diagnoses linked to this encounter.   Please see After Visit Summary for patient specific instructions.  Future Appointments  Date Time Provider Department  Center  10/04/2024  8:00 AM Primitivo Merkey Nattalie, NP CP-CP None  10/07/2024 10:00 AM Bernardo Bruckner, [facility] CP-[facility] None    No orders of the defined types were placed in this encounter.   -------------------------------    [1] No Known Allergies

## 2024-10-07 ENCOUNTER — Ambulatory Visit (INDEPENDENT_AMBULATORY_CARE_PROVIDER_SITE_OTHER): Admitting: Mental Health

## 2024-10-07 DIAGNOSIS — F331 Major depressive disorder, recurrent, moderate: Secondary | ICD-10-CM | POA: Diagnosis not present

## 2024-10-07 NOTE — Progress Notes (Signed)
 [facility] Note  Name: Demarr Kluever. Date:  10/07/24 MRN: [id number]   DOB: 09/09/1986 PCP: Purcell Emil Schanz, MD  Time spent: 50 minutes  Treatment:  Individual therapy  Mental Status Exam:    Appearance:   Casual     Behavior:  Appropriate  Motor:  Normal  Speech/Language:   Clear and Coherent  Affect:  full range  Mood:  Anxious, pleasant  Thought process:  normal  Thought content:    WNL  Sensory/Perceptual disturbances:    none  Orientation:  x4  Attention:  Good  Concentration:  Good  Memory:  WNL  Fund of knowledge:   Good  Insight:    Good  Judgment:   Good  Impulse Control:  Good   Reported Symptoms:  Sleep problems-wakes in the middle of the night, low motivation, isolative, intermittent depressed mood, anxiety, struggles with daily hygiene (not showering, brushing teeth etc), self depreciating thoughts  Risk Assessment: Danger to Self:  No Self-injurious Behavior: No Danger to Others: No Duty to Warn:no Physical Aggression / Violence:No  Access to Firearms a concern: No  Gang Involvement:No  Patient / guardian was educated about steps to take if suicide or homicide risk level increases between visits: yes While future psychiatric events cannot be accurately predicted, the patient does not currently require acute inpatient psychiatric care and does not currently meet [place]  involuntary commitment criteria.    Medications: Current Outpatient Medications  Medication Sig Dispense Refill   Ascorbic Acid  (VITAMIN C) 1000 MG tablet Take 2,000 mg by mouth daily.     buPROPion  (WELLBUTRIN  XL) 300 MG 24 hr tablet Take 1 tablet (300 mg total) by mouth daily. 30 tablet 5   cholecalciferol  (VITAMIN D3) 25 MCG (1000 UNIT) tablet Take 2,000 Units by mouth daily.     fluticasone  (FLONASE ) 50 MCG/ACT nasal spray Place 2 sprays into both nostrils daily. 16 g 0   methylphenidate  (CONCERTA ) 36 MG PO CR tablet Take one tablet in the morning and  one tablet at lunch. 60 tablet 0   [START ON 11/01/2024] methylphenidate  (CONCERTA ) 36 MG PO CR tablet Take 1 tablet (36 mg total) by mouth 2 (two) times daily with breakfast and lunch. 60 tablet 0   Multiple Vitamin (MULTIVITAMIN) capsule Take 1 capsule by mouth daily.     Omega-3 Fatty Acids (FISH OIL ) 1000 MG CAPS Take 1,000 mg by mouth daily.     perphenazine  (TRILAFON ) 8 MG tablet Take one tablet twice daily. 60 tablet 5   propranolol  (INDERAL ) 10 MG tablet Take 1 tablet (10 mg total) by mouth 2 (two) times daily. 60 tablet 0   No current facility-administered medications for this visit.    Subjective: Patient presents for session on time.  Patient shared changes since his last visit which was about 1 year ago.  He stated that he attempted employment but found it too overwhelming based on the job requirements and quit the job about 2 weeks later.  He shared how he wants to contribute, help his parents financially due to continuing to live with them.  He stated his father also lost his job a few months ago.  Utilized MI to facilitate patient identifying needs where he wants to improve his obtaining more independence.  He plans to seek other employment options and this was explored collaboratively.  He plans to look diligently particularly after the turn of the year when potential hiring might take place.  Interventions: Motivational interviewing, supportive therapy, problem solving strategies  Diagnoses:    ICD-10-CM   1. Major depressive disorder, recurrent episode, moderate degree (HCC)  F33.1           Plan: Patient is to use CBT, mindfulness and coping skills to help decrease symptoms associated with their diagnosis.  Patient to look into employment options as well as college courses to eventually obtain more independence.    Long-term goal:   Reduce overall level, frequency, and intensity of the feelings of depression and anxiety 7-8/10 to a 0-2/10 in severity for at least 3  consecutive months.   Short-term goal:  Decrease anxiety producing self talk such as thinking of the worse possible life outcome Decrease ruminating, which can affect his sleep and increased emotional distress Continue to take medications as prescribed Increased self-confidence by engaging in an exercise regimen, working out Increase steps toward independence by finding employment and eventually moving out of this parent's home    Assessment of progress:  progressing  Lonni Fischer, [facility]

## 2024-11-03 ENCOUNTER — Telehealth: Payer: Self-pay | Admitting: Adult Health

## 2024-11-03 NOTE — Telephone Encounter (Signed)
 Spoke with pt, advised him he has RF's at pharmacy.

## 2024-11-03 NOTE — Telephone Encounter (Signed)
 Adam Larsen is requesting a refill on his Concerta , Wellbutrin , Trilafon , and Inderal . Pharmacy is:  [id number] - [place], [place] - [address]   Phone: [phone]  Fax: [phone]

## 2024-11-03 NOTE — Telephone Encounter (Signed)
 Dick is requesting a refill on his Concerta , Wellbutrin , Trilafon , and Inderal . Pharmacy is:  [id number] - [place], [place] - [address]   Phone: [phone]  Fax: [phone]

## 2024-11-05 ENCOUNTER — Ambulatory Visit: Admitting: Adult Health

## 2024-11-05 NOTE — Progress Notes (Signed)
 No charge.

## 2024-11-08 ENCOUNTER — Ambulatory Visit: Admitting: Mental Health

## 2024-12-07 ENCOUNTER — Ambulatory Visit: Admitting: Adult Health

## 2025-01-03 ENCOUNTER — Ambulatory Visit: Admitting: Adult Health

## 2025-02-02 ENCOUNTER — Ambulatory Visit: Admitting: Adult Health

## 2025-03-07 ENCOUNTER — Ambulatory Visit: Admitting: Adult Health

## 2025-04-04 ENCOUNTER — Ambulatory Visit: Admitting: Adult Health
